# Patient Record
Sex: Male | Born: 1945 | Race: Black or African American | Hispanic: No | Smoking: Never smoker
Health system: Southern US, Community
[De-identification: ages and names within clinical notes are randomized; demographics above are authoritative.]

## PROBLEM LIST (undated history)

## (undated) DIAGNOSIS — E785 Hyperlipidemia, unspecified: Secondary | ICD-10-CM

## (undated) DIAGNOSIS — H269 Unspecified cataract: Secondary | ICD-10-CM

## (undated) DIAGNOSIS — I252 Old myocardial infarction: Secondary | ICD-10-CM

## (undated) DIAGNOSIS — E119 Type 2 diabetes mellitus without complications: Secondary | ICD-10-CM

## (undated) DIAGNOSIS — R21 Rash and other nonspecific skin eruption: Secondary | ICD-10-CM

## (undated) DIAGNOSIS — M109 Gout, unspecified: Secondary | ICD-10-CM

## (undated) DIAGNOSIS — I2510 Atherosclerotic heart disease of native coronary artery without angina pectoris: Secondary | ICD-10-CM

## (undated) DIAGNOSIS — J13 Pneumonia due to Streptococcus pneumoniae: Secondary | ICD-10-CM

## (undated) DIAGNOSIS — I10 Essential (primary) hypertension: Secondary | ICD-10-CM

## (undated) HISTORY — DX: Pneumonia due to Streptococcus pneumoniae: J13

## (undated) HISTORY — DX: Old myocardial infarction: I25.2

## (undated) HISTORY — DX: Unspecified cataract: H26.9

## (undated) HISTORY — DX: Gout, unspecified: M10.9

## (undated) HISTORY — DX: Type 2 diabetes mellitus without complications: E11.9

## (undated) HISTORY — DX: Hyperlipidemia, unspecified: E78.5

## (undated) HISTORY — DX: Atherosclerotic heart disease of native coronary artery without angina pectoris: I25.10

## (undated) HISTORY — PX: CORONARY ANGIOPLASTY WITH STENT PLACEMENT: SHX49

## (undated) HISTORY — DX: Essential (primary) hypertension: I10

## (undated) HISTORY — DX: Rash and other nonspecific skin eruption: R21

---

## 2001-07-11 HISTORY — PX: CORONARY ARTERY BYPASS GRAFT: SHX141

## 2001-10-11 ENCOUNTER — Encounter: Payer: Self-pay | Admitting: *Deleted

## 2001-10-11 ENCOUNTER — Emergency Department (HOSPITAL_COMMUNITY): Admission: EM | Admit: 2001-10-11 | Discharge: 2001-10-11 | Payer: Self-pay | Admitting: *Deleted

## 2001-11-22 DIAGNOSIS — I252 Old myocardial infarction: Secondary | ICD-10-CM

## 2001-11-22 HISTORY — DX: Old myocardial infarction: I25.2

## 2003-04-26 ENCOUNTER — Inpatient Hospital Stay (HOSPITAL_COMMUNITY): Admission: EM | Admit: 2003-04-26 | Discharge: 2003-05-06 | Payer: Self-pay

## 2003-04-26 ENCOUNTER — Encounter: Payer: Self-pay | Admitting: Emergency Medicine

## 2003-04-29 ENCOUNTER — Encounter: Payer: Self-pay | Admitting: Thoracic Surgery (Cardiothoracic Vascular Surgery)

## 2003-04-30 ENCOUNTER — Encounter: Payer: Self-pay | Admitting: Thoracic Surgery (Cardiothoracic Vascular Surgery)

## 2003-05-01 ENCOUNTER — Encounter: Payer: Self-pay | Admitting: Thoracic Surgery (Cardiothoracic Vascular Surgery)

## 2003-06-02 ENCOUNTER — Encounter (HOSPITAL_COMMUNITY): Admission: RE | Admit: 2003-06-02 | Discharge: 2003-08-31 | Payer: Self-pay | Admitting: *Deleted

## 2003-09-12 ENCOUNTER — Ambulatory Visit (HOSPITAL_COMMUNITY): Admission: RE | Admit: 2003-09-12 | Discharge: 2003-09-13 | Payer: Self-pay | Admitting: *Deleted

## 2004-10-26 ENCOUNTER — Ambulatory Visit: Payer: Self-pay | Admitting: Internal Medicine

## 2004-10-26 ENCOUNTER — Inpatient Hospital Stay (HOSPITAL_COMMUNITY): Admission: AD | Admit: 2004-10-26 | Discharge: 2004-10-28 | Payer: Self-pay | Admitting: Internal Medicine

## 2004-10-29 ENCOUNTER — Encounter: Admission: RE | Admit: 2004-10-29 | Discharge: 2005-01-27 | Payer: Self-pay | Admitting: Internal Medicine

## 2004-11-01 ENCOUNTER — Ambulatory Visit: Payer: Self-pay | Admitting: Internal Medicine

## 2004-11-29 ENCOUNTER — Ambulatory Visit: Payer: Self-pay | Admitting: Internal Medicine

## 2004-12-30 ENCOUNTER — Ambulatory Visit: Payer: Self-pay | Admitting: Internal Medicine

## 2005-03-31 ENCOUNTER — Ambulatory Visit: Payer: Self-pay | Admitting: Internal Medicine

## 2005-06-28 ENCOUNTER — Ambulatory Visit: Payer: Self-pay | Admitting: Internal Medicine

## 2005-10-25 ENCOUNTER — Ambulatory Visit: Payer: Self-pay | Admitting: Internal Medicine

## 2006-02-21 ENCOUNTER — Ambulatory Visit: Payer: Self-pay | Admitting: Internal Medicine

## 2006-05-30 ENCOUNTER — Ambulatory Visit: Payer: Self-pay | Admitting: Internal Medicine

## 2006-05-30 LAB — CONVERTED CEMR LAB
ALT: 18 units/L (ref 0–40)
AST: 21 units/L (ref 0–37)
Albumin: 4.1 g/dL (ref 3.5–5.2)
Alkaline Phosphatase: 105 units/L (ref 39–117)
BUN: 11 mg/dL (ref 6–23)
CO2: 31 meq/L (ref 19–32)
Calcium: 9.7 mg/dL (ref 8.4–10.5)
Chloride: 100 meq/L (ref 96–112)
Chol/HDL Ratio, serum: 3.3
Cholesterol: 127 mg/dL (ref 0–200)
Creatinine, Ser: 0.8 mg/dL (ref 0.4–1.5)
GFR calc non Af Amer: 105 mL/min
Glomerular Filtration Rate, Af Am: 127 mL/min/{1.73_m2}
Glucose, Bld: 101 mg/dL — ABNORMAL HIGH (ref 70–99)
HCT: 41.3 % (ref 39.0–52.0)
HDL: 38.6 mg/dL — ABNORMAL LOW (ref >39.0)
Hemoglobin: 13.9 g/dL (ref 13.0–17.0)
Hgb A1c MFr Bld: 6.6 % — ABNORMAL HIGH (ref 4.6–6.0)
LDL Cholesterol: 77 mg/dL (ref 0–99)
MCHC: 33.7 g/dL (ref 30.0–36.0)
MCV: 85.4 fL (ref 78.0–100.0)
PSA: 0.38 ng/mL (ref 0.10–4.00)
Platelets: 268 10*3/uL (ref 150–400)
Potassium: 3.5 meq/L (ref 3.5–5.1)
RBC: 4.83 M/uL (ref 4.22–5.81)
RDW: 13.1 % (ref 11.5–14.6)
Sodium: 139 meq/L (ref 135–145)
Total Bilirubin: 1.1 mg/dL (ref 0.3–1.2)
Total Protein: 8 g/dL (ref 6.0–8.3)
Triglyceride fasting, serum: 59 mg/dL (ref 0–149)
VLDL: 12 mg/dL (ref 0–40)
WBC: 9 10*3/uL (ref 4.5–10.5)

## 2006-06-05 ENCOUNTER — Ambulatory Visit: Payer: Self-pay | Admitting: Internal Medicine

## 2006-08-22 ENCOUNTER — Ambulatory Visit: Payer: Self-pay | Admitting: Internal Medicine

## 2006-08-22 LAB — CONVERTED CEMR LAB
Hgb A1c MFr Bld: 6.7 % — ABNORMAL HIGH (ref 4.6–6.0)
Uric Acid, Serum: 8.4 mg/dL — ABNORMAL HIGH (ref 2.4–7.0)

## 2006-11-21 ENCOUNTER — Ambulatory Visit: Payer: Self-pay | Admitting: Internal Medicine

## 2006-11-21 LAB — CONVERTED CEMR LAB
Hgb A1c MFr Bld: 6.6 % — ABNORMAL HIGH (ref 4.6–6.0)
Uric Acid, Serum: 5.6 mg/dL (ref 2.4–7.0)

## 2006-11-23 ENCOUNTER — Encounter: Payer: Self-pay | Admitting: Internal Medicine

## 2006-11-23 DIAGNOSIS — E785 Hyperlipidemia, unspecified: Secondary | ICD-10-CM

## 2006-11-23 DIAGNOSIS — E7800 Pure hypercholesterolemia, unspecified: Secondary | ICD-10-CM | POA: Insufficient documentation

## 2006-11-23 DIAGNOSIS — I2510 Atherosclerotic heart disease of native coronary artery without angina pectoris: Secondary | ICD-10-CM | POA: Insufficient documentation

## 2006-11-23 DIAGNOSIS — E1169 Type 2 diabetes mellitus with other specified complication: Secondary | ICD-10-CM | POA: Insufficient documentation

## 2006-11-23 DIAGNOSIS — E119 Type 2 diabetes mellitus without complications: Secondary | ICD-10-CM

## 2006-11-23 DIAGNOSIS — I10 Essential (primary) hypertension: Secondary | ICD-10-CM | POA: Insufficient documentation

## 2006-11-23 DIAGNOSIS — I252 Old myocardial infarction: Secondary | ICD-10-CM | POA: Insufficient documentation

## 2006-11-23 HISTORY — DX: Type 2 diabetes mellitus without complications: E11.9

## 2006-11-23 HISTORY — DX: Atherosclerotic heart disease of native coronary artery without angina pectoris: I25.10

## 2006-11-23 HISTORY — DX: Hyperlipidemia, unspecified: E78.5

## 2006-11-23 HISTORY — DX: Essential (primary) hypertension: I10

## 2007-02-21 ENCOUNTER — Ambulatory Visit: Payer: Self-pay | Admitting: Internal Medicine

## 2007-03-27 ENCOUNTER — Encounter: Payer: Self-pay | Admitting: Internal Medicine

## 2007-05-21 ENCOUNTER — Ambulatory Visit: Payer: Self-pay | Admitting: Internal Medicine

## 2007-05-21 LAB — CONVERTED CEMR LAB: Hgb A1c MFr Bld: 6.3 % — ABNORMAL HIGH (ref 4.6–6.0)

## 2007-08-29 ENCOUNTER — Ambulatory Visit: Payer: Self-pay | Admitting: Internal Medicine

## 2007-08-29 LAB — CONVERTED CEMR LAB
ALT: 18 units/L (ref 0–53)
AST: 21 units/L (ref 0–37)
Albumin: 4 g/dL (ref 3.5–5.2)
Alkaline Phosphatase: 81 units/L (ref 39–117)
BUN: 10 mg/dL (ref 6–23)
Basophils Absolute: 0 10*3/uL (ref 0.0–0.1)
Basophils Relative: 0.1 % (ref 0.0–1.0)
Bilirubin Urine: NEGATIVE
Bilirubin, Direct: 0.2 mg/dL (ref 0.0–0.3)
CO2: 30 meq/L (ref 19–32)
Calcium: 9.6 mg/dL (ref 8.4–10.5)
Chloride: 104 meq/L (ref 96–112)
Cholesterol: 118 mg/dL (ref 0–200)
Creatinine, Ser: 0.8 mg/dL (ref 0.4–1.5)
Creatinine,U: 233.9 mg/dL
Eosinophils Absolute: 0.3 10*3/uL (ref 0.0–0.6)
Eosinophils Relative: 3.2 % (ref 0.0–5.0)
GFR calc Af Amer: 126 mL/min
GFR calc non Af Amer: 104 mL/min
Glucose, Bld: 109 mg/dL — ABNORMAL HIGH (ref 70–99)
Glucose, Urine, Semiquant: NEGATIVE
HCT: 40.3 % (ref 39.0–52.0)
HDL: 35.2 mg/dL — ABNORMAL LOW (ref >39.0)
Hemoglobin: 13.6 g/dL (ref 13.0–17.0)
Hgb A1c MFr Bld: 6.3 % — ABNORMAL HIGH (ref 4.6–6.0)
Ketones, urine, test strip: NEGATIVE
LDL Cholesterol: 68 mg/dL (ref 0–99)
Lymphocytes Relative: 37.6 % (ref 12.0–46.0)
MCHC: 33.7 g/dL (ref 30.0–36.0)
MCV: 87.5 fL (ref 78.0–100.0)
Microalb Creat Ratio: 18.4 mg/g (ref 0.0–30.0)
Microalb, Ur: 4.3 mg/dL — ABNORMAL HIGH (ref 0.0–1.9)
Monocytes Absolute: 0.7 10*3/uL (ref 0.2–0.7)
Monocytes Relative: 7.6 % (ref 3.0–11.0)
Neutro Abs: 4.8 10*3/uL (ref 1.4–7.7)
Neutrophils Relative %: 51.5 % (ref 43.0–77.0)
Nitrite: NEGATIVE
PSA: 0.27 ng/mL (ref 0.10–4.00)
Platelets: 210 10*3/uL (ref 150–400)
Potassium: 4.2 meq/L (ref 3.5–5.1)
RBC: 4.61 M/uL (ref 4.22–5.81)
RDW: 13.6 % (ref 11.5–14.6)
Sodium: 139 meq/L (ref 135–145)
Specific Gravity, Urine: >=1.03
TSH: 3.66 microintl units/mL (ref 0.35–5.50)
Total Bilirubin: 1.2 mg/dL (ref 0.3–1.2)
Total CHOL/HDL Ratio: 3.4
Total Protein: 7.2 g/dL (ref 6.0–8.3)
Triglycerides: 74 mg/dL (ref 0–149)
Urobilinogen, UA: 0.2
VLDL: 15 mg/dL (ref 0–40)
WBC Urine, dipstick: NEGATIVE
WBC: 9.3 10*3/uL (ref 4.5–10.5)
pH: 5.5

## 2007-09-03 ENCOUNTER — Ambulatory Visit: Payer: Self-pay | Admitting: Internal Medicine

## 2007-09-04 ENCOUNTER — Encounter: Payer: Self-pay | Admitting: Internal Medicine

## 2007-12-05 ENCOUNTER — Ambulatory Visit: Payer: Self-pay | Admitting: Internal Medicine

## 2007-12-05 LAB — CONVERTED CEMR LAB: Hgb A1c MFr Bld: 6.4 % — ABNORMAL HIGH (ref 4.6–6.0)

## 2008-02-15 ENCOUNTER — Ambulatory Visit: Payer: Self-pay | Admitting: Internal Medicine

## 2008-02-15 LAB — CONVERTED CEMR LAB: Hgb A1c MFr Bld: 6.8 % — ABNORMAL HIGH (ref 4.6–6.0)

## 2008-05-20 ENCOUNTER — Ambulatory Visit: Payer: Self-pay | Admitting: Internal Medicine

## 2008-05-20 DIAGNOSIS — M109 Gout, unspecified: Secondary | ICD-10-CM | POA: Insufficient documentation

## 2008-05-20 HISTORY — DX: Gout, unspecified: M10.9

## 2008-05-20 LAB — CONVERTED CEMR LAB: Hgb A1c MFr Bld: 6.9 % — ABNORMAL HIGH (ref 4.6–6.0)

## 2008-09-18 ENCOUNTER — Encounter: Payer: Self-pay | Admitting: Internal Medicine

## 2008-09-19 ENCOUNTER — Ambulatory Visit: Payer: Self-pay | Admitting: Internal Medicine

## 2008-09-19 LAB — CONVERTED CEMR LAB
Cholesterol, target level: 200 mg/dL
HDL goal, serum: 40 mg/dL
LDL Goal: 70 mg/dL

## 2008-09-22 LAB — CONVERTED CEMR LAB
ALT: 43 units/L (ref 0–53)
AST: 32 units/L (ref 0–37)
Albumin: 4.1 g/dL (ref 3.5–5.2)
Alkaline Phosphatase: 120 units/L — ABNORMAL HIGH (ref 39–117)
BUN: 8 mg/dL (ref 6–23)
Basophils Absolute: 0 10*3/uL (ref 0.0–0.1)
Basophils Relative: 0.4 % (ref 0.0–3.0)
Bilirubin, Direct: 0.1 mg/dL (ref 0.0–0.3)
CO2: 27 meq/L (ref 19–32)
Calcium: 9.6 mg/dL (ref 8.4–10.5)
Chloride: 104 meq/L (ref 96–112)
Cholesterol: 106 mg/dL (ref 0–200)
Creatinine, Ser: 0.7 mg/dL (ref 0.4–1.5)
Eosinophils Absolute: 0.3 10*3/uL (ref 0.0–0.7)
Eosinophils Relative: 3.3 % (ref 0.0–5.0)
GFR calc Af Amer: 147 mL/min
GFR calc non Af Amer: 121 mL/min
Glucose, Bld: 230 mg/dL — ABNORMAL HIGH (ref 70–99)
HCT: 40.2 % (ref 39.0–52.0)
HDL: 29.7 mg/dL — ABNORMAL LOW (ref >39.0)
Hemoglobin: 14.2 g/dL (ref 13.0–17.0)
Hgb A1c MFr Bld: 7.6 % — ABNORMAL HIGH (ref 4.6–6.0)
LDL Cholesterol: 58 mg/dL (ref 0–99)
Lymphocytes Relative: 30.7 % (ref 12.0–46.0)
MCHC: 35.4 g/dL (ref 30.0–36.0)
MCV: 88.4 fL (ref 78.0–100.0)
Monocytes Absolute: 0.8 10*3/uL (ref 0.1–1.0)
Monocytes Relative: 8.4 % (ref 3.0–12.0)
Neutro Abs: 5.5 10*3/uL (ref 1.4–7.7)
Neutrophils Relative %: 57.2 % (ref 43.0–77.0)
PSA: 0.27 ng/mL (ref 0.10–4.00)
Platelets: 175 10*3/uL (ref 150–400)
Potassium: 4 meq/L (ref 3.5–5.1)
RBC: 4.55 M/uL (ref 4.22–5.81)
RDW: 13.3 % (ref 11.5–14.6)
Sodium: 138 meq/L (ref 135–145)
TSH: 1.98 microintl units/mL (ref 0.35–5.50)
Total Bilirubin: 1.1 mg/dL (ref 0.3–1.2)
Total CHOL/HDL Ratio: 3.6
Total Protein: 7.7 g/dL (ref 6.0–8.3)
Triglycerides: 90 mg/dL (ref 0–149)
VLDL: 18 mg/dL (ref 0–40)
WBC: 9.5 10*3/uL (ref 4.5–10.5)

## 2008-10-01 ENCOUNTER — Ambulatory Visit: Payer: Self-pay | Admitting: Gastroenterology

## 2008-11-10 ENCOUNTER — Ambulatory Visit: Payer: Self-pay | Admitting: Gastroenterology

## 2008-11-19 ENCOUNTER — Ambulatory Visit: Payer: Self-pay | Admitting: Gastroenterology

## 2008-12-22 ENCOUNTER — Ambulatory Visit: Payer: Self-pay | Admitting: Internal Medicine

## 2008-12-23 ENCOUNTER — Encounter: Payer: Self-pay | Admitting: Internal Medicine

## 2008-12-23 LAB — CONVERTED CEMR LAB: Hgb A1c MFr Bld: 8 % — ABNORMAL HIGH (ref 4.6–6.5)

## 2009-03-25 ENCOUNTER — Ambulatory Visit: Payer: Self-pay | Admitting: Internal Medicine

## 2009-03-25 LAB — CONVERTED CEMR LAB: Hgb A1c MFr Bld: 7.2 % — ABNORMAL HIGH (ref 4.6–6.5)

## 2009-06-22 ENCOUNTER — Ambulatory Visit: Payer: Self-pay | Admitting: Internal Medicine

## 2009-06-22 LAB — CONVERTED CEMR LAB: Hgb A1c MFr Bld: 7.1 % — ABNORMAL HIGH (ref 4.6–6.5)

## 2009-09-08 ENCOUNTER — Ambulatory Visit: Payer: Self-pay | Admitting: Internal Medicine

## 2009-09-08 DIAGNOSIS — J13 Pneumonia due to Streptococcus pneumoniae: Secondary | ICD-10-CM

## 2009-09-08 HISTORY — DX: Pneumonia due to Streptococcus pneumoniae: J13

## 2009-09-09 LAB — CONVERTED CEMR LAB: Hgb A1c MFr Bld: 7.5 % — ABNORMAL HIGH (ref 4.6–6.5)

## 2009-12-09 ENCOUNTER — Ambulatory Visit: Payer: Self-pay | Admitting: Internal Medicine

## 2009-12-09 LAB — CONVERTED CEMR LAB: Hgb A1c MFr Bld: 7.2 % — ABNORMAL HIGH (ref 4.6–6.5)

## 2010-03-16 ENCOUNTER — Ambulatory Visit: Payer: Self-pay | Admitting: Internal Medicine

## 2010-03-16 LAB — CONVERTED CEMR LAB
Blood Glucose, Fingerstick: 165
Hgb A1c MFr Bld: 7.9 % — ABNORMAL HIGH (ref 4.6–6.5)

## 2010-06-15 ENCOUNTER — Ambulatory Visit: Payer: Self-pay | Admitting: Internal Medicine

## 2010-06-17 LAB — CONVERTED CEMR LAB: Hgb A1c MFr Bld: 7.9 % — ABNORMAL HIGH (ref 4.6–6.5)

## 2010-08-10 NOTE — Assessment & Plan Note (Signed)
Summary: 3 month rov/njr   Vital Signs:  Patient profile:   65 year old male Weight:      223 pounds BMI:     30.35 Temp:     97.8 degrees F oral BP sitting:   134 / 70  (left arm) Cuff size:   regular  Vitals Entered By: Raechel Ache, RN (December 22, 2008 10:03 AM)  CC:  3 mo ROV. FBS 154.Marland Kitchen  History of Present Illness: 65 year old male, history of hypertension and dyslipidemia.  He has type 2 diabetes.  His last hemoglobin A1c7.6.  He denies any cardiopulmonary complaints.  Laboratory studies were done last visit, which revealed a low HDL cholesterol.  He is unaware that he has tried niacin in the past.  His LDL was at goal.  He continues to tolerate statin therapy without difficulty.  He has been compliant with his medications  Problems Prior to Update: 1)  Preventive Health Care  (ICD-V70.0) 2)  Gout  (ICD-274.9) 3)  Family History of Cad Male 1st Degree Relative <60  (ICD-V16.49) 4)  R Hand Surgery/traumatic Injury  () 5)  Myocardial Infarction, Hx of  (ICD-412) 6)  Hypertension  (ICD-401.9) 7)  Hyperlipidemia  (ICD-272.4) 8)  Diabetes Mellitus, Type II  (ICD-250.00) 9)  Coronary Artery Disease  (ICD-414.00)  Medications Prior to Update: 1)  Altace 10 Mg Caps (Ramipril) .Marland Kitchen.. 1 Once Daily 2)  Amlodipine Besylate 5 Mg Tabs (Amlodipine Besylate) .... Take 1 Tablet By Mouth Once A Day 3)  Bayer Aspirin 325 Mg Tabs (Aspirin) .... Take 1 Tablet By Mouth Once A Day 4)  Colchicine 0.6 Mg Tabs (Colchicine) .... Take 1 Tablet By Mouth Twice A Day 5)  Glimepiride 4 Mg Tabs (Glimepiride) .... Take 1 Tablet By Mouth Twice A Day 6)  Lipitor 40 Mg Tabs (Atorvastatin Calcium) .Marland Kitchen.. 1 Once Daily 7)  Metformin Hcl 500 Mg Tabs (Metformin Hcl) .... 2 Two Times A Day 8)  Metoprolol Tartrate 100 Mg Tabs (Metoprolol Tartrate) .... Take 1 Tablet By Mouth Twice A Day 9)  Freestyle Test Strips .... Use Two Times A Day 10)  Allopurinol 300 Mg  Tabs (Allopurinol) .Marland Kitchen.. 1 Once Daily 11)  Freestyle  Unistick Ii Lancets   Misc (Lancets) 12)  Moviprep 100 Gm  Solr (Peg-Kcl-Nacl-Nasulf-Na Asc-C) .... As Directed  Allergies: No Known Drug Allergies  Past History:  Past Medical History: Coronary artery disease Diabetes mellitus, type II Hyperlipidemia Hypertension Myocardial infarction, hx of Gout low HDL cholesterol  Past Surgical History: Reviewed history from 09/03/2007 and no changes required. Coronary artery bypass graft (2004) PTCA/stent (2005) hand surgery, right hand following a traumatic injury remote prior sigmoidoscopies, but no recent colonoscopy  Family History: Reviewed history from 02/21/2007 and no changes required. both parents died of coronary artery disease; mother had diabeteshe is one of 12 siblings, strongly positive for diabetes Family History of CAD Male 1st degree relative <60  Review of Systems  The patient denies anorexia, fever, weight loss, weight gain, vision loss, decreased hearing, hoarseness, chest pain, syncope, dyspnea on exertion, peripheral edema, prolonged cough, headaches, hemoptysis, abdominal pain, melena, hematochezia, severe indigestion/heartburn, hematuria, incontinence, genital sores, muscle weakness, suspicious skin lesions, transient blindness, difficulty walking, depression, unusual weight change, abnormal bleeding, enlarged lymph nodes, angioedema, breast masses, and testicular masses.    Physical Exam  General:  Well-developed,well-nourished,in no acute distress; alert,appropriate and cooperative throughout examination Head:  Normocephalic and atraumatic without obvious abnormalities. No apparent alopecia or balding. Eyes:  No corneal  or conjunctival inflammation noted. EOMI. Perrla. Funduscopic exam benign, without hemorrhages, exudates or papilledema. Vision grossly normal. Mouth:  Oral mucosa and oropharynx without lesions or exudates.  Teeth in good repair. Lungs:  Normal respiratory effort, chest expands symmetrically.  Lungs are clear to auscultation, no crackles or wheezes. Heart:  Normal rate and regular rhythm. S1 and S2 normal without gallop, murmur, click, rub or other extra sounds. Abdomen:  Bowel sounds positive,abdomen soft and non-tender without masses, organomegaly or hernias noted. Msk:  No deformity or scoliosis noted of thoracic or lumbar spine.   Pulses:  R and L carotid,radial,femoral,dorsalis pedis and posterior tibial pulses are full and equal bilaterally Extremities:  No clubbing, cyanosis, edema, or deformity noted with normal full range of motion of all joints.     Impression & Recommendations:  Problem # 1:  HYPERLIPIDEMIA (ICD-272.4)  His updated medication list for this problem includes:    Lipitor 40 Mg Tabs (Atorvastatin calcium) .Marland Kitchen... 1 once daily will add niacin to his regimen.  Samples of Niaspan, dispensed    His updated medication list for this problem includes:    Lipitor 40 Mg Tabs (Atorvastatin calcium) .Marland Kitchen... 1 once daily  Orders: Venipuncture (16109) TLB-A1C / Hgb A1C (Glycohemoglobin) (83036-A1C)  Problem # 2:  HYPERTENSION (ICD-401.9)  His updated medication list for this problem includes:    Altace 10 Mg Caps (Ramipril) .Marland Kitchen... 1 once daily    Amlodipine Besylate 5 Mg Tabs (Amlodipine besylate) .Marland Kitchen... Take 1 tablet by mouth once a day    Metoprolol Tartrate 100 Mg Tabs (Metoprolol tartrate) .Marland Kitchen... Take 1 tablet by mouth twice a day    His updated medication list for this problem includes:    Altace 10 Mg Caps (Ramipril) .Marland Kitchen... 1 once daily    Amlodipine Besylate 5 Mg Tabs (Amlodipine besylate) .Marland Kitchen... Take 1 tablet by mouth once a day    Metoprolol Tartrate 100 Mg Tabs (Metoprolol tartrate) .Marland Kitchen... Take 1 tablet by mouth twice a day  Orders: Venipuncture (60454) TLB-A1C / Hgb A1C (Glycohemoglobin) (83036-A1C)  Problem # 3:  DIABETES MELLITUS, TYPE II (ICD-250.00)  His updated medication list for this problem includes:    Altace 10 Mg Caps (Ramipril) .Marland Kitchen... 1  once daily    Bayer Aspirin 325 Mg Tabs (Aspirin) .Marland Kitchen... Take 1 tablet by mouth once a day    Glimepiride 4 Mg Tabs (Glimepiride) .Marland Kitchen... Take 1 tablet by mouth twice a day will repeat a hemoglobin A1c today if greater than 7.  Will add Januvia    His updated medication list for this problem includes:    Altace 10 Mg Caps (Ramipril) .Marland Kitchen... 1 once daily    Bayer Aspirin 325 Mg Tabs (Aspirin) .Marland Kitchen... Take 1 tablet by mouth once a day    Glimepiride 4 Mg Tabs (Glimepiride) .Marland Kitchen... Take 1 tablet by mouth twice a day  Orders: Venipuncture (09811) TLB-A1C / Hgb A1C (Glycohemoglobin) (83036-A1C)  Complete Medication List: 1)  Altace 10 Mg Caps (Ramipril) .Marland Kitchen.. 1 once daily 2)  Amlodipine Besylate 5 Mg Tabs (Amlodipine besylate) .... Take 1 tablet by mouth once a day 3)  Bayer Aspirin 325 Mg Tabs (Aspirin) .... Take 1 tablet by mouth once a day 4)  Colchicine 0.6 Mg Tabs (Colchicine) .... Take 1 tablet by mouth twice a day 5)  Glimepiride 4 Mg Tabs (Glimepiride) .... Take 1 tablet by mouth twice a day 6)  Lipitor 40 Mg Tabs (Atorvastatin calcium) .Marland Kitchen.. 1 once daily 7)  Metformin Hcl 1000 Mg Tabs (  metformin Hcl)  .... One tablet two  times a day 8)  Metoprolol Tartrate 100 Mg Tabs (Metoprolol tartrate) .... Take 1 tablet by mouth twice a day 9)  Freestyle Test Strips  .... Use two times a day 10)  Allopurinol 300 Mg Tabs (Allopurinol) .Marland Kitchen.. 1 once daily 11)  Freestyle Unistick Ii Lancets Misc (Lancets) 12)  Moviprep 100 Gm Solr (Peg-kcl-nacl-nasulf-na asc-c) .... As directed  Patient Instructions: 1)  Please schedule a follow-up appointment in 3 months. 2)  Limit your Sodium (Salt) to less than 2 grams a day(slightly less than 1/2 a teaspoon) to prevent fluid retention, swelling, or worsening of symptoms. 3)  It is important that you exercise regularly at least 20 minutes 5 times a week. If you develop chest pain, have severe difficulty breathing, or feel very tired , stop exercising immediately and  seek medical attention. 4)  Check your blood sugars regularly. If your readings are usually above : or below 70 you should contact our office. 5)  It is important that your Diabetic A1c level is checked every 3 months. 6)  See your eye doctor yearly to check for diabetic eye damage. 7)  attempt to slowly titrate your nice and does to 1500 mg at bedtime Prescriptions: METFORMIN HCL  1000 MG TABS (METFORMIN HCL) one tablet two  times a day  #180 x 6   Entered and Authorized by:   Gordy Savers  MD   Signed by:   Gordy Savers  MD on 12/22/2008   Method used:   Print then Give to Patient   RxID:   [id number]

## 2010-08-10 NOTE — Assessment & Plan Note (Signed)
Summary: 3 MONTH ROA/JLS   Vital Signs:  Patient Profile:   65 Years Old Male Height:     72 inches (182.88 cm) Weight:      204 pounds Temp:     98.3 degrees F oral BP sitting:   160 / 80  (left arm)  Vitals Entered By: Raechel Ache, RN (February 21, 2007 8:23 AM)               Chief Complaint:  ROV. FBS @home  118.Marland Kitchen  History of Present Illness: 65 year old male follow-up diabetes, hypertension, gout  Current Allergies: No known allergies    Family History:    both parents died of coronary artery disease; mother had diabeteshe is one of 12 siblings, strongly positive for diabetes    Family History of CAD Male 1st degree relative <60    Review of Systems  The patient denies anorexia, fever, weight loss, vision loss, decreased hearing, hoarseness, chest pain, syncope, dyspnea on exhertion, peripheral edema, prolonged cough, hemoptysis, abdominal pain, melena, hematochezia, severe indigestion/heartburn, hematuria, incontinence, genital sores, muscle weakness, suspicious skin lesions, transient blindness, difficulty walking, depression, unusual weight change, abnormal bleeding, enlarged lymph nodes, angioedema, breast masses, and testicular masses.         no significant gout.  Blood sugars at home have been under good control   Physical Exam  General:     Well-developed,well-nourished,in no acute distress; alert,appropriate and cooperative throughout examination Head:     Normocephalic and atraumatic without obvious abnormalities. No apparent alopecia or balding. Mouth:     Oral mucosa and oropharynx without lesions or exudates.  Teeth in good repair. Neck:     No deformities, masses, or tenderness noted. Lungs:     Normal respiratory effort, chest expands symmetrically. Lungs are clear to auscultation, no crackles or wheezes. Heart:     grade 2/6 systolic murmur Abdomen:     Bowel sounds positive,abdomen soft and non-tender without masses, organomegaly or  hernias noted.    Impression & Recommendations:  Problem # 1:  DIABETES MELLITUS, TYPE II (ICD-250.00)  His updated medication list for this problem includes:    Altace 10 Mg Caps (Ramipril) .Marland Kitchen... 1 once daily    Bayer Aspirin 325 Mg Tabs (Aspirin) .Marland Kitchen... Take 1 tablet by mouth once a day    Glimepiride 4 Mg Tabs (Glimepiride) .Marland Kitchen... Take 1 tablet by mouth twice a day    Metformin Hcl 500 Mg Tabs (Metformin hcl) .Marland Kitchen... 1 two times a day   Problem # 2:  HYPERTENSION (ICD-401.9)  His updated medication list for this problem includes:    Altace 10 Mg Caps (Ramipril) .Marland Kitchen... 1 once daily    Amlodipine Besylate 5 Mg Tabs (Amlodipine besylate) .Marland Kitchen... Take 1 tablet by mouth once a day    Metoprolol Tartrate 100 Mg Tabs (Metoprolol tartrate) .Marland Kitchen... Take 1 tablet by mouth twice a day   Complete Medication List: 1)  Allopurinol 100 Mg Tabs (Allopurinol) .... Take 1 tablet by mouth once a day 2)  Altace 10 Mg Caps (Ramipril) .Marland Kitchen.. 1 once daily 3)  Amlodipine Besylate 5 Mg Tabs (Amlodipine besylate) .... Take 1 tablet by mouth once a day 4)  Bayer Aspirin 325 Mg Tabs (Aspirin) .... Take 1 tablet by mouth once a day 5)  Colchicine 0.6 Mg Tabs (Colchicine) .... Take 1 tablet by mouth twice a day 6)  Glimepiride 4 Mg Tabs (Glimepiride) .... Take 1 tablet by mouth twice a day 7)  Lipitor 40 Mg Tabs (Atorvastatin  calcium) .Marland Kitchen.. 1 once daily 8)  Metformin Hcl 500 Mg Tabs (Metformin hcl) .Marland Kitchen.. 1 two times a day 9)  Metoprolol Tartrate 100 Mg Tabs (Metoprolol tartrate) .... Take 1 tablet by mouth twice a day   Patient Instructions: 1)  Please schedule a follow-up appointment in 3 months. 2)  Limit your Sodium (Salt) to less than 4 grams a day (slightly less than 1 teaspoon) to prevent fluid retention, swelling, or worsening or symptoms. 3)  It is important that you exercise regularly at least 20 minutes 5 times a week. If you develop chest pain, have severe difficulty breathing, or feel very tired , stop  exercising immediately and seek medical attention. 4)  Take an Aspirin every day. 5)  It is important that your Diabetic A1c level is checked every 3 months.    Prescriptions: METOPROLOL TARTRATE 100 MG TABS (METOPROLOL TARTRATE) Take 1 tablet by mouth twice a day  #180 x 6   Entered and Authorized by:   Gordy Savers  MD   Signed by:   Gordy Savers  MD on 02/21/2007   Method used:   Print then Give to Patient   RxID:   [id number] METFORMIN HCL 500 MG TABS (METFORMIN HCL) 1 two times a day  #180 x 6   Entered and Authorized by:   Gordy Savers  MD   Signed by:   Gordy Savers  MD on 02/21/2007   Method used:   Print then Give to Patient   RxID:   [id number] LIPITOR 40 MG TABS (ATORVASTATIN CALCIUM) 1 once daily  #90 x 6   Entered and Authorized by:   Gordy Savers  MD   Signed by:   Gordy Savers  MD on 02/21/2007   Method used:   Print then Give to Patient   RxID:   [id number] GLIMEPIRIDE 4 MG TABS (GLIMEPIRIDE) Take 1 tablet by mouth twice a day  #90 x 6   Entered and Authorized by:   Gordy Savers  MD   Signed by:   Gordy Savers  MD on 02/21/2007   Method used:   Print then Give to Patient   RxID:   [id number] COLCHICINE 0.6 MG TABS (COLCHICINE) Take 1 tablet by mouth twice a day  #180 x 6   Entered and Authorized by:   Gordy Savers  MD   Signed by:   Gordy Savers  MD on 02/21/2007   Method used:   Print then Give to Patient   RxID:   [id number] AMLODIPINE BESYLATE 5 MG TABS (AMLODIPINE BESYLATE) Take 1 tablet by mouth once a day  #90 x 6   Entered and Authorized by:   Gordy Savers  MD   Signed by:   Gordy Savers  MD on 02/21/2007   Method used:   Print then Give to Patient   RxID:   [id number] ALTACE 10 MG CAPS (RAMIPRIL) 1 once daily  #90 x 6   Entered and Authorized by:   Gordy Savers  MD   Signed by:   Gordy Savers  MD on 02/21/2007   Method  used:   Print then Give to Patient   RxID:   [id number] ALLOPURINOL 100 MG TABS (ALLOPURINOL) Take 1 tablet by mouth once a day  #90 x 6   Entered and Authorized by:   Gordy Savers  MD   Signed by:   Gordy Savers  MD  on 02/21/2007   Method used:   Print then Give to Patient   RxID:   [id number]

## 2010-08-10 NOTE — Assessment & Plan Note (Signed)
Summary: 3 month rov/njr/R/S.Marland KitchenSLM   Vital Signs:  Patient profile:   65 year old male Height:      72 inches Weight:      223 pounds BMI:     30.35 Temp:     98.0 degrees F oral BP sitting:   130 / 66  (left arm) Cuff size:   regular  Vitals Entered By: Raechel Ache, RN (March 25, 2009 10:42 AM) CC: 3 mo ROV. BS 132 Is Patient Diabetic? Yes   CC:  3 mo ROV. BS 132.  History of Present Illness: 65 year old patient who is seen today for follow-up of his coronary artery disease.  He is status post CABG.  He has type 2 diabetes, angina, and  Januvia 100 mg daily was added to his regimen 3 months ago.  At that time.  Hemoglobin A1c was 8.  He has noted a nice improvement in glycemic control.  He has treated hypertension and dyslipidemia.  He is tolerating statin therapy without difficulty.  There is been no nausea or concerns about his new medication.  His cardiac status remained stable.  He has a history gout, which has not been active  Allergies: No Known Drug Allergies  Past History:  Past Medical History: Reviewed history from 12/22/2008 and no changes required. Coronary artery disease Diabetes mellitus, type II Hyperlipidemia Hypertension Myocardial infarction, hx of Gout low HDL cholesterol  Past Surgical History: Reviewed history from 09/03/2007 and no changes required. Coronary artery bypass graft (2004) PTCA/stent (2005) hand surgery, right hand following a traumatic injury remote prior sigmoidoscopies, but no recent colonoscopy  Family History: Reviewed history from 02/21/2007 and no changes required. both parents died of coronary artery disease; mother had diabeteshe is one of 12 siblings, strongly positive for diabetes Family History of CAD Male 1st degree relative <60  Social History: Reviewed history from 09/03/2007 and no changes required. works part-time with a [employer] and is quite active physically  Review of Systems  The patient  denies anorexia, fever, weight loss, weight gain, vision loss, decreased hearing, hoarseness, chest pain, syncope, dyspnea on exertion, peripheral edema, prolonged cough, headaches, hemoptysis, abdominal pain, melena, hematochezia, severe indigestion/heartburn, hematuria, incontinence, genital sores, muscle weakness, suspicious skin lesions, transient blindness, difficulty walking, depression, unusual weight change, abnormal bleeding, enlarged lymph nodes, angioedema, breast masses, and testicular masses.    Physical Exam  General:  Well-developed,well-nourished,in no acute distress; alert,appropriate and cooperative throughout examination; 130/70 Head:  Normocephalic and atraumatic without obvious abnormalities. No apparent alopecia or balding. Eyes:  No corneal or conjunctival inflammation noted. EOMI. Perrla. Funduscopic exam benign, without hemorrhages, exudates or papilledema. Vision grossly normal. Mouth:  Oral mucosa and oropharynx without lesions or exudates.  Teeth in good repair. Neck:  No deformities, masses, or tenderness noted. Chest Wall:  sternotomy scar Lungs:  Normal respiratory effort, chest expands symmetrically. Lungs are clear to auscultation, no crackles or wheezes. Heart:  grade 2/6 systolic murmur Abdomen:  Bowel sounds positive,abdomen soft and non-tender without masses, organomegaly or hernias noted. Msk:  No deformity or scoliosis noted of thoracic or lumbar spine.    mild to moderate callus formation plantar surface of both first metatarsal joints  Sensation is intact to monofilament testing and vibratory sensation Pulses:  posterior tibial pulses not easily palpable dorsalis pedis pulses are intact Neurologic:  sensation intact to light touch and sensation intact to pinprick.  sensation intact to light touch and sensation intact to pinprick.    Diabetes Management Exam:  Eye Exam:       Eye Exam done here today          Results: normal   Impression &  Recommendations:  Problem # 1:  HYPERTENSION (ICD-401.9)  His updated medication list for this problem includes:    Altace 10 Mg Caps (Ramipril) .Marland Kitchen... 1 once daily    Amlodipine Besylate 5 Mg Tabs (Amlodipine besylate) .Marland Kitchen... Take 1 tablet by mouth once a day    Metoprolol Tartrate 100 Mg Tabs (Metoprolol tartrate) .Marland Kitchen... Take 1 tablet by mouth twice a day  His updated medication list for this problem includes:    Altace 10 Mg Caps (Ramipril) .Marland Kitchen... 1 once daily    Amlodipine Besylate 5 Mg Tabs (Amlodipine besylate) .Marland Kitchen... Take 1 tablet by mouth once a day    Metoprolol Tartrate 100 Mg Tabs (Metoprolol tartrate) .Marland Kitchen... Take 1 tablet by mouth twice a day  Problem # 2:  HYPERLIPIDEMIA (ICD-272.4)  His updated medication list for this problem includes:    Lipitor 40 Mg Tabs (Atorvastatin calcium) .Marland Kitchen... 1 once daily  His updated medication list for this problem includes:    Lipitor 40 Mg Tabs (Atorvastatin calcium) .Marland Kitchen... 1 once daily  Problem # 3:  DIABETES MELLITUS, TYPE II (ICD-250.00)  His updated medication list for this problem includes:    Altace 10 Mg Caps (Ramipril) .Marland Kitchen... 1 once daily    Bayer Aspirin 325 Mg Tabs (Aspirin) .Marland Kitchen... Take 1 tablet by mouth once a day    Glimepiride 4 Mg Tabs (Glimepiride) .Marland Kitchen... Take 1 tablet by mouth twice a day    Januvia 100 Mg Tabs (Sitagliptin phosphate) .Marland Kitchen... 1 once daily    His updated medication list for this problem includes:    Altace 10 Mg Caps (Ramipril) .Marland Kitchen... 1 once daily    Bayer Aspirin 325 Mg Tabs (Aspirin) .Marland Kitchen... Take 1 tablet by mouth once a day    Glimepiride 4 Mg Tabs (Glimepiride) .Marland Kitchen... Take 1 tablet by mouth twice a day    Januvia 100 Mg Tabs (Sitagliptin phosphate) .Marland Kitchen... 1 once daily  Orders: Venipuncture (21308) TLB-A1C / Hgb A1C (Glycohemoglobin) (83036-A1C)  Complete Medication List: 1)  Altace 10 Mg Caps (Ramipril) .Marland Kitchen.. 1 once daily 2)  Amlodipine Besylate 5 Mg Tabs (Amlodipine besylate) .... Take 1 tablet by mouth once  a day 3)  Bayer Aspirin 325 Mg Tabs (Aspirin) .... Take 1 tablet by mouth once a day 4)  Colchicine 0.6 Mg Tabs (Colchicine) .... Take 1 tablet by mouth twice a day 5)  Glimepiride 4 Mg Tabs (Glimepiride) .... Take 1 tablet by mouth twice a day 6)  Lipitor 40 Mg Tabs (Atorvastatin calcium) .Marland Kitchen.. 1 once daily 7)  Metformin Hcl 1000 Mg Tabs (metformin Hcl)  .... One tablet two  times a day 8)  Metoprolol Tartrate 100 Mg Tabs (Metoprolol tartrate) .... Take 1 tablet by mouth twice a day 9)  Freestyle Test Strips  .... Use two times a day 10)  Allopurinol 300 Mg Tabs (Allopurinol) .Marland Kitchen.. 1 once daily 11)  Freestyle Unistick Ii Lancets Misc (Lancets) 12)  Moviprep 100 Gm Solr (Peg-kcl-nacl-nasulf-na asc-c) .... As directed 13)  Januvia 100 Mg Tabs (Sitagliptin phosphate) .Marland Kitchen.. 1 once daily  Patient Instructions: 1)  Please schedule a follow-up appointment in 3 months. 2)  Limit your Sodium (Salt). 3)  It is important that you exercise regularly at least 20 minutes 5 times a week. If you develop chest pain, have severe difficulty breathing, or feel very tired ,  stop exercising immediately and seek medical attention. 4)  You need to lose weight. Consider a lower calorie diet and regular exercise.  5)  Check your blood sugars regularly. If your readings are usually above : or below 70 you should contact our office. 6)  It is important that your Diabetic A1c level is checked every 3 months. 7)  See your eye doctor yearly to check for diabetic eye damage. Prescriptions: JANUVIA 100 MG TABS (SITAGLIPTIN PHOSPHATE) 1 once daily  #90 x 6   Entered and Authorized by:   Gordy Savers  MD   Signed by:   Gordy Savers  MD on 03/25/2009   Method used:   Electronically to        [facility] [phone]* (retail)       [address]       [place], [place]  [address]       Ph: [phone] or [phone]       Fax: [phone]   RxID:   [id number] FREESTYLE  UNISTICK II LANCETS   MISC (LANCETS)   #100 x 6   Entered and Authorized by:   Gordy Savers  MD   Signed by:   Gordy Savers  MD on 03/25/2009   Method used:   Print then Give to Patient   RxID:   [id number] ALLOPURINOL 300 MG  TABS (ALLOPURINOL) 1 once daily  #100 x 6   Entered and Authorized by:   Gordy Savers  MD   Signed by:   Gordy Savers  MD on 03/25/2009   Method used:   Electronically to        [facility] [phone]* (retail)       [address]       [place], [place]  [address]       Ph: [phone] or [phone]       Fax: [phone]   RxID:   [id number] FREESTYLE TEST STRIPS use two times a day  #100 x 6   Entered and Authorized by:   Gordy Savers  MD   Signed by:   Gordy Savers  MD on 03/25/2009   Method used:   Print then Give to Patient   RxID:   [id number] METOPROLOL TARTRATE 100 MG TABS (METOPROLOL TARTRATE) Take 1 tablet by mouth twice a day  #180 x 5   Entered and Authorized by:   Gordy Savers  MD   Signed by:   Gordy Savers  MD on 03/25/2009   Method used:   Electronically to        [facility] [phone]* (retail)       [address]       [place], [place]  [address]       Ph: [phone] or [phone]       Fax: [phone]   RxID:   [id number] METFORMIN HCL  1000 MG TABS (METFORMIN HCL) one tablet two  times a day  #180 x 6   Entered and Authorized by:   Gordy Savers  MD   Signed by:   Gordy Savers  MD on 03/25/2009   Method used:   Print then Give to Patient   RxID:   [id number] LIPITOR 40 MG TABS (  ATORVASTATIN CALCIUM) 1 once daily  #90 x 5   Entered and Authorized by:   Gordy Savers  MD   Signed by:   Gordy Savers  MD on 03/25/2009   Method used:   Electronically to        [facility] [phone]* (retail)       [address]       [place], [place]  [address]       Ph: [phone] or [phone]       Fax: [phone]   RxID:   [id number] GLIMEPIRIDE 4 MG TABS (GLIMEPIRIDE) Take 1 tablet by mouth twice a day  #90 x 6   Entered and Authorized by:   Gordy Savers  MD   Signed by:   Gordy Savers  MD on 03/25/2009   Method used:   Electronically to        [facility] [phone]* (retail)       [address]       [place], [place]  [address]       Ph: [phone] or [phone]       Fax: [phone]   RxID:   [id number] COLCHICINE 0.6 MG TABS (COLCHICINE) Take 1 tablet by mouth twice a day  #180 x 6   Entered and Authorized by:   Gordy Savers  MD   Signed by:   Gordy Savers  MD on 03/25/2009   Method used:   Electronically to        [facility] [phone]* (retail)       [address]       [place], [place]  [address]       Ph: [phone] or [phone]       Fax: [phone]   RxID:   [id number] AMLODIPINE BESYLATE 5 MG TABS (AMLODIPINE BESYLATE) Take 1 tablet by mouth once a day  #90 x 5   Entered and Authorized by:   Gordy Savers  MD   Signed by:   Gordy Savers  MD on 03/25/2009   Method used:   Electronically to        [facility] [phone]* (retail)       [address]       [place], [place]  [address]       Ph: [phone] or [phone]       Fax: [phone]   RxID:   [id number] ALTACE 10 MG CAPS (RAMIPRIL) 1 once daily  #90 x 5   Entered and Authorized by:   Gordy Savers  MD   Signed by:   Gordy Savers  MD on 03/25/2009   Method used:   Electronically to        [facility] [phone]* (retail)       [address]       [place], [place]  [address]       Ph: [phone] or [phone]       Fax: [phone]   RxID:   [id number] JANUVIA 100 MG TABS (SITAGLIPTIN PHOSPHATE) 1 once daily   #90 x 6   Entered and Authorized by:  Gordy Savers  MD   Signed by:   Gordy Savers  MD on 03/25/2009   Method used:   Print then Give to Patient   RxID:   [id number] FREESTYLE UNISTICK II LANCETS   MISC (LANCETS)   #100 x 6   Entered and Authorized by:   Gordy Savers  MD   Signed by:   Gordy Savers  MD on 03/25/2009   Method used:   Print then Give to Patient   RxID:   [id number] ALLOPURINOL 300 MG  TABS (ALLOPURINOL) 1 once daily  #100 x 6   Entered and Authorized by:   Gordy Savers  MD   Signed by:   Gordy Savers  MD on 03/25/2009   Method used:   Print then Give to Patient   RxID:   [id number] FREESTYLE TEST STRIPS use two times a day  #100 x 6   Entered and Authorized by:   Gordy Savers  MD   Signed by:   Gordy Savers  MD on 03/25/2009   Method used:   Print then Give to Patient   RxID:   [id number] METOPROLOL TARTRATE 100 MG TABS (METOPROLOL TARTRATE) Take 1 tablet by mouth twice a day  #180 x 5   Entered and Authorized by:   Gordy Savers  MD   Signed by:   Gordy Savers  MD on 03/25/2009   Method used:   Print then Give to Patient   RxID:   [id number] METFORMIN HCL  1000 MG TABS (METFORMIN HCL) one tablet two  times a day  #180 x 6   Entered and Authorized by:   Gordy Savers  MD   Signed by:   Gordy Savers  MD on 03/25/2009   Method used:   Print then Give to Patient   RxID:   [id number] LIPITOR 40 MG TABS (ATORVASTATIN CALCIUM) 1 once daily  #90 x 5   Entered and Authorized by:   Gordy Savers  MD   Signed by:   Gordy Savers  MD on 03/25/2009   Method used:   Print then Give to Patient   RxID:   [id number] GLIMEPIRIDE 4 MG TABS (GLIMEPIRIDE) Take 1 tablet by mouth twice a day  #90 x 6   Entered and Authorized by:   Gordy Savers  MD   Signed by:   Gordy Savers  MD on 03/25/2009   Method used:   Print then Give to Patient    RxID:   [id number] COLCHICINE 0.6 MG TABS (COLCHICINE) Take 1 tablet by mouth twice a day  #180 x 6   Entered and Authorized by:   Gordy Savers  MD   Signed by:   Gordy Savers  MD on 03/25/2009   Method used:   Print then Give to Patient   RxID:   [id number] AMLODIPINE BESYLATE 5 MG TABS (AMLODIPINE BESYLATE) Take 1 tablet by mouth once a day  #90 x 5   Entered and Authorized by:   Gordy Savers  MD   Signed by:   Gordy Savers  MD on 03/25/2009   Method used:   Print then Give to Patient   RxID:   [id number] ALTACE 10 MG CAPS (RAMIPRIL) 1 once daily  #90 x 5   Entered and Authorized by:   Gordy Savers  MD   Signed by:   Gordy Savers  MD on 03/25/2009   Method used:   Print then Give to Patient   RxID:   [id number]

## 2010-08-10 NOTE — Assessment & Plan Note (Signed)
Summary: 3 MONTH ROV/NJR/RESCHED/CB   Vital Signs:  Patient profile:   65 year old male Weight:      222 pounds Temp:     97.9 degrees F oral BP sitting:   140 / 80  (left arm) Cuff size:   regular  Vitals Entered By: Duard Brady LPN (December 09, 2128 11:47 AM) CC: 3 mos rov - doing well    fbs 143     has been running 100's to 120's Is Patient Diabetic? Yes Did you bring your meter with you today? Yes   CC:  3 mos rov - doing well    fbs 143     has been running 100's to 120's.  History of Present Illness: 65 year old patient who is seen today for follow-up.  He has a history of dyslipidemia type 2 diabetes, hypertension, and coronary artery disease.  He is maintained lies glycemic control with fasting blood sugars ranging from 100 to 120.  He denies any exertional chest pain.  he has a history of gout, which also has been stable.  A random blood sugar today 143.  He was seen earlier in the spring for left lower lobe pneumonia.  He denies any pulmonary complaints  Allergies (verified): No Known Drug Allergies  Past History:  Past Medical History: Reviewed history from 12/22/2008 and no changes required. Coronary artery disease Diabetes mellitus, type II Hyperlipidemia Hypertension Myocardial infarction, hx of Gout low HDL cholesterol  Past Surgical History: Reviewed history from 09/03/2007 and no changes required. Coronary artery bypass graft (2004) PTCA/stent (2005) hand surgery, right hand following a traumatic injury remote prior sigmoidoscopies, but no recent colonoscopy  Review of Systems  The patient denies anorexia, fever, weight loss, weight gain, vision loss, decreased hearing, hoarseness, chest pain, syncope, dyspnea on exertion, peripheral edema, prolonged cough, headaches, hemoptysis, abdominal pain, melena, hematochezia, severe indigestion/heartburn, hematuria, incontinence, genital sores, muscle weakness, suspicious skin lesions, transient blindness,  difficulty walking, depression, unusual weight change, abnormal bleeding, enlarged lymph nodes, angioedema, breast masses, and testicular masses.    Physical Exam  General:  Well-developed,well-nourished,in no acute distress; alert,appropriate and cooperative throughout examination; blood pressure 130/80 Head:  Normocephalic and atraumatic without obvious abnormalities. No apparent alopecia or balding. Eyes:  No corneal or conjunctival inflammation noted. EOMI. Perrla. Funduscopic exam benign, without hemorrhages, exudates or papilledema. Vision grossly normal. Mouth:  Oral mucosa and oropharynx without lesions or exudates.  Teeth in good repair. Neck:  No deformities, masses, or tenderness noted. Lungs:  Normal respiratory effort, chest expands symmetrically. Lungs are clear to auscultation, no crackles or wheezes. Heart:  Normal rate and regular rhythm. S1 and S2 normal without gallop, murmur, click, rub or other extra sounds. Abdomen:  Bowel sounds positive,abdomen soft and non-tender without masses, organomegaly or hernias noted. Msk:  No deformity or scoliosis noted of thoracic or lumbar spine.   Extremities:  No clubbing, cyanosis, edema, or deformity noted with normal full range of motion of all joints.    Diabetes Management Exam:    Foot Exam (with socks and/or shoes not present):       Sensory-Pinprick/Light touch:          Left medial foot (L-4): normal          Left dorsal foot (L-5): normal          Left lateral foot (S-1): normal          Right medial foot (L-4): normal  Right dorsal foot (L-5): normal          Right lateral foot (S-1): normal       Sensory-Monofilament:          Left foot: normal          Right foot: normal       Inspection:          Left foot: normal          Right foot: normal       Nails:          Left foot: normal          Right foot: normal    Foot Exam by Podiatrist:       Date: 12/09/2009       Results: no diabetic findings       Done  by: PCP   Impression & Recommendations:  Problem # 1:  HYPERTENSION (ICD-401.9)  His updated medication list for this problem includes:    Altace 10 Mg Caps (Ramipril) .Marland Kitchen... 1 once daily    Amlodipine Besylate 5 Mg Tabs (Amlodipine besylate) .Marland Kitchen... Take 1 tablet by mouth once a day    Metoprolol Tartrate 100 Mg Tabs (Metoprolol tartrate) .Marland Kitchen... Take 1 tablet by mouth twice a day  His updated medication list for this problem includes:    Altace 10 Mg Caps (Ramipril) .Marland Kitchen... 1 once daily    Amlodipine Besylate 5 Mg Tabs (Amlodipine besylate) .Marland Kitchen... Take 1 tablet by mouth once a day    Metoprolol Tartrate 100 Mg Tabs (Metoprolol tartrate) .Marland Kitchen... Take 1 tablet by mouth twice a day  Problem # 2:  HYPERLIPIDEMIA (ICD-272.4)  His updated medication list for this problem includes:    Lipitor 40 Mg Tabs (Atorvastatin calcium) .Marland Kitchen... 1 once daily  His updated medication list for this problem includes:    Lipitor 40 Mg Tabs (Atorvastatin calcium) .Marland Kitchen... 1 once daily  Problem # 3:  DIABETES MELLITUS, TYPE II (ICD-250.00)  His updated medication list for this problem includes:    Altace 10 Mg Caps (Ramipril) .Marland Kitchen... 1 once daily    Bayer Aspirin 325 Mg Tabs (Aspirin) .Marland Kitchen... Take 1 tablet by mouth once a day    Glimepiride 4 Mg Tabs (Glimepiride) .Marland Kitchen... Take 1 tablet by mouth twice a day    Januvia 100 Mg Tabs (Sitagliptin phosphate) .Marland Kitchen... 1 once daily    His updated medication list for this problem includes:    Altace 10 Mg Caps (Ramipril) .Marland Kitchen... 1 once daily    Bayer Aspirin 325 Mg Tabs (Aspirin) .Marland Kitchen... Take 1 tablet by mouth once a day    Glimepiride 4 Mg Tabs (Glimepiride) .Marland Kitchen... Take 1 tablet by mouth twice a day    Januvia 100 Mg Tabs (Sitagliptin phosphate) .Marland Kitchen... 1 once daily  Orders: Venipuncture ([address]) TLB-A1C / Hgb A1C (Glycohemoglobin) (83036-A1C)  Problem # 4:  CORONARY ARTERY DISEASE (ICD-414.00)  His updated medication list for this problem includes:    Altace 10 Mg Caps (Ramipril)  .Marland Kitchen... 1 once daily    Amlodipine Besylate 5 Mg Tabs (Amlodipine besylate) .Marland Kitchen... Take 1 tablet by mouth once a day    Bayer Aspirin 325 Mg Tabs (Aspirin) .Marland Kitchen... Take 1 tablet by mouth once a day    Metoprolol Tartrate 100 Mg Tabs (Metoprolol tartrate) .Marland Kitchen... Take 1 tablet by mouth twice a day  His updated medication list for this problem includes:    Altace 10 Mg Caps (Ramipril) .Marland Kitchen... 1 once daily    Amlodipine  Besylate 5 Mg Tabs (Amlodipine besylate) .Marland Kitchen... Take 1 tablet by mouth once a day    Bayer Aspirin 325 Mg Tabs (Aspirin) .Marland Kitchen... Take 1 tablet by mouth once a day    Metoprolol Tartrate 100 Mg Tabs (Metoprolol tartrate) .Marland Kitchen... Take 1 tablet by mouth twice a day  Complete Medication List: 1)  Altace 10 Mg Caps (Ramipril) .Marland Kitchen.. 1 once daily 2)  Amlodipine Besylate 5 Mg Tabs (Amlodipine besylate) .... Take 1 tablet by mouth once a day 3)  Bayer Aspirin 325 Mg Tabs (Aspirin) .... Take 1 tablet by mouth once a day 4)  Colchicine 0.6 Mg Tabs (Colchicine) .... Take 1 tablet by mouth daily 5)  Glimepiride 4 Mg Tabs (Glimepiride) .... Take 1 tablet by mouth twice a day 6)  Lipitor 40 Mg Tabs (Atorvastatin calcium) .Marland Kitchen.. 1 once daily 7)  Metformin Hcl 1000 Mg Tabs (metformin Hcl)  .... One tablet two  times a day 8)  Metoprolol Tartrate 100 Mg Tabs (Metoprolol tartrate) .... Take 1 tablet by mouth twice a day 9)  Freestyle Test Strips  .... Use two times a day 10)  Allopurinol 300 Mg Tabs (Allopurinol) .Marland Kitchen.. 1 once daily 11)  Freestyle Unistick Ii Lancets Misc (Lancets) 12)  Januvia 100 Mg Tabs (Sitagliptin phosphate) .Marland Kitchen.. 1 once daily 13)  Colcrys 0.6 Mg Tabs (Colchicine) .... Qd 14)  Azithromycin 250 Mg Tabs (Azithromycin) .... Two initially, then one daily for 4 additional days  Patient Instructions: 1)  Please schedule a follow-up appointment in 3 months. 2)  Limit your Sodium (Salt). 3)  It is important that you exercise regularly at least 20 minutes 5 times a week. If you develop chest pain,  have severe difficulty breathing, or feel very tired , stop exercising immediately and seek medical attention. 4)  You need to lose weight. Consider a lower calorie diet and regular exercise.  5)  Check your blood sugars regularly. If your readings are usually above : or below 70 you should contact our office. 6)  It is important that your Diabetic A1c level is checked every 3 months. 7)  See your eye doctor yearly to check for diabetic eye damage.

## 2010-08-10 NOTE — Assessment & Plan Note (Signed)
Summary: 3 month rov/njr/pt rescd -not feeling well//ccm   Vital Signs:  Patient profile:   66 year old male Weight:      222 pounds O2 Sat:      97 % on Room air Temp:     100.0 degrees F oral BP sitting:   180 / 100  (right arm) Cuff size:   regular  Vitals Entered By: Duard Brady LPN (September 08, 1608 1:48 PM)  O2 Flow:  Room air CC: 3 mos rov - cold sx - cough non-productive , (L) flank pain with cough, sob , otc not helping    FBS 129 Is Patient Diabetic? Yes   CC:  3 mos rov - cold sx - cough non-productive , (L) flank pain with cough, sob , and otc not helping    FBS 129.  History of Present Illness: 35 -year-old patient who is seen today for follow-up  of his hypertension, type 2 diabetes, and dyslipidemia.  In the past few weeks.  He has felt unwell with a dry, large and nonproductive cough is at persistent left posterior chest wall pain aggravated by coughing.  He has had fever, but denies any chills.  No sputum production.  He has type 2 diabetes and his last hemoglobin A1c7.1.  He has a history of gout, which has been stable and remains on Lipitor 40 mg daily for his dyslipidemia, which he continues to tolerate.  Preventive Screening-Counseling & Management  Alcohol-Tobacco     Smoking Status: never  Allergies (verified): No Known Drug Allergies  Past History:  Past Medical History: Reviewed history from 12/22/2008 and no changes required. Coronary artery disease Diabetes mellitus, type II Hyperlipidemia Hypertension Myocardial infarction, hx of Gout low HDL cholesterol  Past Surgical History: Reviewed history from 09/03/2007 and no changes required. Coronary artery bypass graft (2004) PTCA/stent (2005) hand surgery, right hand following a traumatic injury remote prior sigmoidoscopies, but no recent colonoscopy  Review of Systems       The patient complains of anorexia, fever, chest pain, and prolonged cough.  The patient denies weight loss,  weight gain, vision loss, decreased hearing, hoarseness, syncope, dyspnea on exertion, peripheral edema, headaches, hemoptysis, abdominal pain, melena, hematochezia, severe indigestion/heartburn, hematuria, incontinence, genital sores, muscle weakness, suspicious skin lesions, transient blindness, difficulty walking, depression, unusual weight change, abnormal bleeding, enlarged lymph nodes, angioedema, breast masses, and testicular masses.    Physical Exam  General:  Well-developed,well-nourished,in no acute distress; alert,appropriate and cooperative throughout examination; 140/80 Head:  Normocephalic and atraumatic without obvious abnormalities. No apparent alopecia or balding. Eyes:  No corneal or conjunctival inflammation noted. EOMI. Perrla. Funduscopic exam benign, without hemorrhages, exudates or papilledema. Vision grossly normal. Ears:  External ear exam shows no significant lesions or deformities.  Otoscopic examination reveals clear canals, tympanic membranes are intact bilaterally without bulging, retraction, inflammation or discharge. Hearing is grossly normal bilaterally. Mouth:  Oral mucosa and oropharynx without lesions or exudates.  Teeth in good repair. Neck:  No deformities, masses, or tenderness noted. Chest Wall:  No deformities, masses, tenderness or gynecomastia noted. Breasts:  No masses or gynecomastia noted Lungs:  rales left lower lung field Heart:  Normal rate and regular rhythm. S1 and S2 normal without gallop, murmur, click, rub or other extra sounds. Abdomen:  Bowel sounds positive,abdomen soft and non-tender without masses, organomegaly or hernias noted. Msk:  No deformity or scoliosis noted of thoracic or lumbar spine.   Pulses:  R and L carotid,radial,femoral,dorsalis pedis and posterior tibial  pulses are full and equal bilaterally Extremities:  No clubbing, cyanosis, edema, or deformity noted with normal full range of motion of all joints.   Skin:  Intact without  suspicious lesions or rashes Cervical Nodes:  No lymphadenopathy noted   Impression & Recommendations:  Problem # 1:  PNEUMONIA, LEFT LOWER LOBE (ICD-481)  His updated medication list for this problem includes:    Azithromycin 250 Mg Tabs (Azithromycin) .Marland Kitchen..Marland Kitchen Two initially, then one daily for 4 additional days  His updated medication list for this problem includes:    Azithromycin 250 Mg Tabs (Azithromycin) .Marland Kitchen..Marland Kitchen Two initially, then one daily for 4 additional days  Problem # 2:  HYPERTENSION (ICD-401.9)  His updated medication list for this problem includes:    Altace 10 Mg Caps (Ramipril) .Marland Kitchen... 1 once daily    Amlodipine Besylate 5 Mg Tabs (Amlodipine besylate) .Marland Kitchen... Take 1 tablet by mouth once a day    Metoprolol Tartrate 100 Mg Tabs (Metoprolol tartrate) .Marland Kitchen... Take 1 tablet by mouth twice a day  His updated medication list for this problem includes:    Altace 10 Mg Caps (Ramipril) .Marland Kitchen... 1 once daily    Amlodipine Besylate 5 Mg Tabs (Amlodipine besylate) .Marland Kitchen... Take 1 tablet by mouth once a day    Metoprolol Tartrate 100 Mg Tabs (Metoprolol tartrate) .Marland Kitchen... Take 1 tablet by mouth twice a day  Problem # 3:  DIABETES MELLITUS, TYPE II (ICD-250.00)  His updated medication list for this problem includes:    Altace 10 Mg Caps (Ramipril) .Marland Kitchen... 1 once daily    Bayer Aspirin 325 Mg Tabs (Aspirin) .Marland Kitchen... Take 1 tablet by mouth once a day    Glimepiride 4 Mg Tabs (Glimepiride) .Marland Kitchen... Take 1 tablet by mouth twice a day    Januvia 100 Mg Tabs (Sitagliptin phosphate) .Marland Kitchen... 1 once daily    His updated medication list for this problem includes:    Altace 10 Mg Caps (Ramipril) .Marland Kitchen... 1 once daily    Bayer Aspirin 325 Mg Tabs (Aspirin) .Marland Kitchen... Take 1 tablet by mouth once a day    Glimepiride 4 Mg Tabs (Glimepiride) .Marland Kitchen... Take 1 tablet by mouth twice a day    Januvia 100 Mg Tabs (Sitagliptin phosphate) .Marland Kitchen... 1 once daily  Orders: Venipuncture (04540) TLB-A1C / Hgb A1C (Glycohemoglobin)  (83036-A1C)  Problem # 4:  HYPERLIPIDEMIA (ICD-272.4)  His updated medication list for this problem includes:    Lipitor 40 Mg Tabs (Atorvastatin calcium) .Marland Kitchen... 1 once daily  His updated medication list for this problem includes:    Lipitor 40 Mg Tabs (Atorvastatin calcium) .Marland Kitchen... 1 once daily  Complete Medication List: 1)  Altace 10 Mg Caps (Ramipril) .Marland Kitchen.. 1 once daily 2)  Amlodipine Besylate 5 Mg Tabs (Amlodipine besylate) .... Take 1 tablet by mouth once a day 3)  Bayer Aspirin 325 Mg Tabs (Aspirin) .... Take 1 tablet by mouth once a day 4)  Colchicine 0.6 Mg Tabs (Colchicine) .... Take 1 tablet by mouth daily 5)  Glimepiride 4 Mg Tabs (Glimepiride) .... Take 1 tablet by mouth twice a day 6)  Lipitor 40 Mg Tabs (Atorvastatin calcium) .Marland Kitchen.. 1 once daily 7)  Metformin Hcl 1000 Mg Tabs (metformin Hcl)  .... One tablet two  times a day 8)  Metoprolol Tartrate 100 Mg Tabs (Metoprolol tartrate) .... Take 1 tablet by mouth twice a day 9)  Freestyle Test Strips  .... Use two times a day 10)  Allopurinol 300 Mg Tabs (Allopurinol) .Marland Kitchen.. 1 once daily 11)  Freestyle Unistick Ii Lancets Misc (Lancets) 12)  Januvia 100 Mg Tabs (Sitagliptin phosphate) .Marland Kitchen.. 1 once daily 13)  Colcrys 0.6 Mg Tabs (Colchicine) .... Qd 14)  Azithromycin 250 Mg Tabs (Azithromycin) .... Two initially, then one daily for 4 additional days  Patient Instructions: 1)  Get plenty of rest, drink lots of clear liquids, and use Tylenol or Ibuprofen for fever and comfort. Return in 7-10 days if you're not better:sooner if you're feeling worse. 2)  Limit your Sodium (Salt) to less than 2 grams a day(slightly less than 1/2 a teaspoon) to prevent fluid retention, swelling, or worsening of symptoms. 3)  Check your blood sugars regularly. If your readings are usually above : or below 70 you should contact our office. 4)  It is important that your Diabetic A1c level is checked every 3 months. 5)  See your eye doctor yearly to check for  diabetic eye damage. 6)  Please schedule a follow-up appointment in 3 months. Prescriptions: AZITHROMYCIN 250 MG TABS (AZITHROMYCIN) two initially, then one daily for 4 additional days  #6 x 0   Entered and Authorized by:   Gordy Savers  MD   Signed by:   Gordy Savers  MD on 09/08/2009   Method used:   Print then Give to Patient   RxID:   [id number] COLCRYS 0.6 MG TABS (COLCHICINE) qd  #90 x 6   Entered and Authorized by:   Gordy Savers  MD   Signed by:   Gordy Savers  MD on 09/08/2009   Method used:   Print then Give to Patient   RxID:   [id number] JANUVIA 100 MG TABS (SITAGLIPTIN PHOSPHATE) 1 once daily  #90 x 6   Entered and Authorized by:   Gordy Savers  MD   Signed by:   Gordy Savers  MD on 09/08/2009   Method used:   Print then Give to Patient   RxID:   [id number] FREESTYLE UNISTICK II LANCETS   MISC (LANCETS)   #100 x 6   Entered and Authorized by:   Gordy Savers  MD   Signed by:   Gordy Savers  MD on 09/08/2009   Method used:   Print then Give to Patient   RxID:   [id number] ALLOPURINOL 300 MG  TABS (ALLOPURINOL) 1 once daily  #100 x 6   Entered and Authorized by:   Gordy Savers  MD   Signed by:   Gordy Savers  MD on 09/08/2009   Method used:   Print then Give to Patient   RxID:   [id number] FREESTYLE TEST STRIPS use two times a day  #100 x 6   Entered and Authorized by:   Gordy Savers  MD   Signed by:   Gordy Savers  MD on 09/08/2009   Method used:   Print then Give to Patient   RxID:   [id number] METOPROLOL TARTRATE 100 MG TABS (METOPROLOL TARTRATE) Take 1 tablet by mouth twice a day  #180 x 5   Entered and Authorized by:   Gordy Savers  MD   Signed by:   Gordy Savers  MD on 09/08/2009   Method used:   Print then Give to Patient   RxID:   [id number] METFORMIN HCL  1000 MG TABS (METFORMIN HCL) one tablet two  times a day  #180 x 6    Entered and Authorized by:   Gordy Savers  MD  Signed by:   Gordy Savers  MD on 09/08/2009   Method used:   Print then Give to Patient   RxID:   [id number] LIPITOR 40 MG TABS (ATORVASTATIN CALCIUM) 1 once daily  #90 x 5   Entered and Authorized by:   Gordy Savers  MD   Signed by:   Gordy Savers  MD on 09/08/2009   Method used:   Print then Give to Patient   RxID:   [id number] GLIMEPIRIDE 4 MG TABS (GLIMEPIRIDE) Take 1 tablet by mouth twice a day  #90 x 6   Entered and Authorized by:   Gordy Savers  MD   Signed by:   Gordy Savers  MD on 09/08/2009   Method used:   Print then Give to Patient   RxID:   [id number] AMLODIPINE BESYLATE 5 MG TABS (AMLODIPINE BESYLATE) Take 1 tablet by mouth once a day  #90 x 5   Entered and Authorized by:   Gordy Savers  MD   Signed by:   Gordy Savers  MD on 09/08/2009   Method used:   Print then Give to Patient   RxID:   [id number] ALTACE 10 MG CAPS (RAMIPRIL) 1 once daily  #90 x 5   Entered and Authorized by:   Gordy Savers  MD   Signed by:   Gordy Savers  MD on 09/08/2009   Method used:   Print then Give to Patient   RxID:   [id number]

## 2010-08-10 NOTE — Miscellaneous (Signed)
Summary: dir cols cr-age.Marland Kitchenem  Clinical Lists Changes  Medications: Added new medication of MOVIPREP 100 GM  SOLR (PEG-KCL-NACL-NASULF-NA ASC-C) As directed - Signed Rx of MOVIPREP 100 GM  SOLR (PEG-KCL-NACL-NASULF-NA ASC-C) As directed;  #1 x 0;  Signed;  Entered by: Clide Cliff RN;  Authorized by: Mardella Layman MD [facility];  Method used: Electronically to [facility] [id number], [place], [place], [place]  [address], Ph: [phone] or [phone], Fax: [phone] Observations: Added new observation of ALLERGY REV: Done (11/10/2008 8:57)    Prescriptions: MOVIPREP 100 GM  SOLR (PEG-KCL-NACL-NASULF-NA ASC-C) As directed  #1 x 0   Entered by:   Clide Cliff RN   Authorized by:   Mardella Layman MD [facility]   Signed by:   Clide Cliff RN on 11/10/2008   Method used:   Electronically to        [facility] [address] [phone]* (retail)       [address]       [place], [place]  [address]       Ph: [phone] or [phone]       Fax: [phone]   RxID:   [id number]

## 2010-08-10 NOTE — Procedures (Signed)
Summary: Colonoscopy   Colonoscopy  Procedure date:  11/19/2008  Findings:      Location:  [facility].    Procedures Next Due Date:    Colonoscopy: 11/2018  COLONOSCOPY PROCEDURE REPORT  PATIENT:  Patrick Wilcox, Patrick Wilcox  MR#:  [id number] BIRTHDATE:   1946/06/15, 63 yrs. old   GENDER:   male  ENDOSCOPIST:   Vania Rea. Jarold Motto, MD, [facility] Referred by: Eleonore Chiquito, M.D.  PROCEDURE DATE:  11/19/2008 PROCEDURE:  Colonoscopy, diagnostic ASA CLASS:   Class II INDICATIONS: screening   MEDICATIONS:    Fentanyl 75 mcg IV, Versed 9 mg IV  DESCRIPTION OF PROCEDURE:   After the risks benefits and alternatives of the procedure were thoroughly explained, informed consent was obtained.  Digital rectal exam was performed and revealed no abnormalities.   The LB [id number] endoscope was introduced through the anus and advanced to the cecum, which was identified by both the appendix and ileocecal valve, limited by a tortuous and redundant colon.    The quality of the prep was excellent, using MoviPrep.  The instrument was then slowly withdrawn as the colon was fully examined. <<PROCEDUREIMAGES>>  <<OLD IMAGES>>  FINDINGS:  Moderate diverticulosis was found sigmoid to descending  This was otherwise a normal examination of the colon.   Retroflexed views in the rectum revealed no abnormalities.    The scope was then withdrawn from the patient and the procedure completed.  COMPLICATIONS:   None  ENDOSCOPIC IMPRESSION:  1) Moderate diverticulosis in the sigmoid to descending  2) Otherwise normal examination RECOMMENDATIONS:  1) You should continue follow current colorectal cancer screening guidelines for "routine risk" patients with a repeat colonoscopy in 10 years. I do not recommend other colon cancer screening prior to then (including stool tests for microscopic blood) unless new symptoms arise.      2) high fiber diet  REPEAT EXAM:    No   _______________________________ Vania Rea. Jarold Motto, MD, [facility]  CC: Eleonore Chiquito, MD

## 2010-08-10 NOTE — Assessment & Plan Note (Signed)
Summary: 3 month rov/njr   Vital Signs:  Patient Profile:   65 Years Old Male Height:     72 inches (182.88 cm) Weight:      206 pounds Temp:     98.1 degrees F BP sitting:   148 / 84  (left arm) Cuff size:   regular  Vitals Entered By: Raechel Ache, RN (Dec 05, 2007 8:12 AM)                 Chief Complaint:  ROV. BS in 90's. C/o toe pain off & on..  History of Present Illness: 65 year old patient seen today for follow-up.  He has type 2 diabetes, hypertension, and gout.  He is doing quite well.  Last ROP was for a full physical with comprehensive lab.  Hemoglobin A1c was 6.2.  Patient is doing well.  No cardiopulmonary complaints.    Current Allergies: No known allergies   Past Medical History:    Reviewed history from 11/23/2006 and no changes required:       Coronary artery disease       Diabetes mellitus, type II       Hyperlipidemia       Hypertension       Myocardial infarction, hx of   Family History:    Reviewed history from 02/21/2007 and no changes required:       both parents died of coronary artery disease; mother had diabeteshe is one of 12 siblings, strongly positive for diabetes       Family History of CAD Male 1st degree relative <60    Review of Systems  The patient denies anorexia, fever, weight loss, weight gain, vision loss, decreased hearing, hoarseness, chest pain, syncope, dyspnea on exertion, peripheral edema, prolonged cough, headaches, hemoptysis, abdominal pain, melena, hematochezia, severe indigestion/heartburn, hematuria, incontinence, genital sores, muscle weakness, suspicious skin lesions, transient blindness, difficulty walking, depression, unusual weight change, abnormal bleeding, enlarged lymph nodes, angioedema, breast masses, and testicular masses.         only complaint is some stiffness involving his left fourth PIP joint and some occasional discomfort involving his left toe   Physical Exam  General:  Well-developed,well-nourished,in no acute distress; alert,appropriate and cooperative throughout examination Head:     Normocephalic and atraumatic without obvious abnormalities. No apparent alopecia or balding. Mouth:     Oral mucosa and oropharynx without lesions or exudates.  Teeth in good repair. Neck:     No deformities, masses, or tenderness noted. Lungs:     Normal respiratory effort, chest expands symmetrically. Lungs are clear to auscultation, no crackles or wheezes. Heart:     Normal rate and regular rhythm. S1 and S2 normal without gallop, murmur, click, rub or other extra sounds. Abdomen:     Bowel sounds positive,abdomen soft and non-tender without masses, organomegaly or hernias noted. Msk:     No deformity or scoliosis noted of thoracic or lumbar spine.   Pulses:     R and L carotid,radial,femoral,dorsalis pedis and posterior tibial pulses are full and equal bilaterally Extremities:     No clubbing, cyanosis, edema, or deformity noted with normal full range of motion of all joints.      Impression & Recommendations:  Problem # 1:  MYOCARDIAL INFARCTION, HX OF (ICD-412)  His updated medication list for this problem includes:    Altace 10 Mg Caps (Ramipril) .Marland Kitchen... 1 once daily    Amlodipine Besylate 5 Mg Tabs (Amlodipine besylate) .Marland Kitchen... Take 1 tablet by mouth once  a day    Bayer Aspirin 325 Mg Tabs (Aspirin) .Marland Kitchen... Take 1 tablet by mouth once a day    Metoprolol Tartrate 100 Mg Tabs (Metoprolol tartrate) .Marland Kitchen... Take 1 tablet by mouth twice a day   Problem # 2:  HYPERTENSION (ICD-401.9)  His updated medication list for this problem includes:    Altace 10 Mg Caps (Ramipril) .Marland Kitchen... 1 once daily    Amlodipine Besylate 5 Mg Tabs (Amlodipine besylate) .Marland Kitchen... Take 1 tablet by mouth once a day    Metoprolol Tartrate 100 Mg Tabs (Metoprolol tartrate) .Marland Kitchen... Take 1 tablet by mouth twice a day   Problem # 3:  HYPERLIPIDEMIA (ICD-272.4)  His updated medication list for this  problem includes:    Lipitor 40 Mg Tabs (Atorvastatin calcium) .Marland Kitchen... 1 once daily   Problem # 4:  DIABETES MELLITUS, TYPE II (ICD-250.00)  His updated medication list for this problem includes:    Altace 10 Mg Caps (Ramipril) .Marland Kitchen... 1 once daily    Bayer Aspirin 325 Mg Tabs (Aspirin) .Marland Kitchen... Take 1 tablet by mouth once a day    Glimepiride 4 Mg Tabs (Glimepiride) .Marland Kitchen... Take 1 tablet by mouth twice a day    Metformin Hcl 500 Mg Tabs (Metformin hcl) .Marland Kitchen... 1 two times a day  Orders: Venipuncture ([address]) TLB-A1C / Hgb A1C (Glycohemoglobin) (83036-A1C)   Complete Medication List: 1)  Altace 10 Mg Caps (Ramipril) .Marland Kitchen.. 1 once daily 2)  Amlodipine Besylate 5 Mg Tabs (Amlodipine besylate) .... Take 1 tablet by mouth once a day 3)  Bayer Aspirin 325 Mg Tabs (Aspirin) .... Take 1 tablet by mouth once a day 4)  Colchicine 0.6 Mg Tabs (Colchicine) .... Take 1 tablet by mouth twice a day 5)  Glimepiride 4 Mg Tabs (Glimepiride) .... Take 1 tablet by mouth twice a day 6)  Lipitor 40 Mg Tabs (Atorvastatin calcium) .Marland Kitchen.. 1 once daily 7)  Metformin Hcl 500 Mg Tabs (Metformin hcl) .Marland Kitchen.. 1 two times a day 8)  Metoprolol Tartrate 100 Mg Tabs (Metoprolol tartrate) .... Take 1 tablet by mouth twice a day 9)  Freestyle Test Strips  .... Use two times a day 10)  Allopurinol 300 Mg Tabs (Allopurinol) .Marland Kitchen.. 1 once daily   Patient Instructions: 1)  Please schedule a follow-up appointment in 3 months. 2)  Limit your Sodium (Salt) to less than 2 grams a day(slightly less than 1/2 a teaspoon) to prevent fluid retention, swelling, or worsening of symptoms. 3)  It is important that you exercise regularly at least 20 minutes 5 times a week. If you develop chest pain, have severe difficulty breathing, or feel very tired , stop exercising immediately and seek medical attention. 4)  Check your blood sugars regularly. If your readings are usually above : or below 70 you should contact our office. 5)  It is important that your  Diabetic A1c level is checked every 3 months. 6)  See your eye doctor yearly to check for diabetic eye damage.   ]

## 2010-08-10 NOTE — Assessment & Plan Note (Signed)
Summary: cpx/nta   Vital Signs:  Patient Profile:   65 Years Old Male Height:     72 inches (182.88 cm) Weight:      202 pounds Temp:     97.9 degrees F oral BP sitting:   156 / 80  (left arm)  Vitals Entered By: Raechel Ache, RN (September 03, 2007 8:45 AM)                 Chief Complaint:  CPX and labs done. Getting over a cold. Having trouble with fingers- hurt and stiff in the morning.Marland Kitchen  History of Present Illness: 65 year old patient seen today for an annual exam.  He has a history of coronary artery disease and presented with an acute MI in October of 2004, and this was followed by CABG in June 2005.  She underwent PTCA with stenting of the first obtuse marginal branch.  He has a history of hypertension, diabetes, and gout.  Onset of new onset diabetes in April 2006    Current Allergies: No known allergies   Past Surgical History:    Coronary artery bypass graft (2004)    PTCA/stent (2005)    hand surgery, right hand following a traumatic injury    remote prior sigmoidoscopies, but no recent colonoscopy   Social History:    works part-time with a [employer] and is quite active physically    Review of Systems       recent URI with some mild diarrhea.  Also complains of some pain and stiffness involving his left hand   Physical Exam  General:     Well-developed,well-nourished,in no acute distress; alert,appropriate and cooperative throughout examination Head:     Normocephalic and atraumatic without obvious abnormalities. No apparent alopecia or balding. Eyes:     No corneal or conjunctival inflammation noted. EOMI. Perrla. Funduscopic exam benign, without hemorrhages, exudates or papilledema. Vision grossly normal. Ears:     External ear exam shows no significant lesions or deformities.  Otoscopic examination reveals clear canals, tympanic membranes are intact bilaterally without bulging, retraction, inflammation or discharge. Hearing is grossly  normal bilaterally. Nose:     External nasal examination shows no deformity or inflammation. Nasal mucosa are pink and moist without lesions or exudates. Mouth:     multiple missing dentition Neck:     No deformities, masses, or tenderness noted. Chest Wall:     No deformities, masses, tenderness or gynecomastia noted. Breasts:     No masses or gynecomastia noted Lungs:     Normal respiratory effort, chest expands symmetrically. Lungs are clear to auscultation, no crackles or wheezes. Heart:     Normal rate and regular rhythm. S1 and S2 normal without gallop, murmur, click, rub or other extra sounds. Abdomen:     Bowel sounds positive,abdomen soft and non-tender without masses, organomegaly or hernias noted. Rectal:     No external abnormalities noted. Normal sphincter tone. No rectal masses or tenderness. Genitalia:     Testes bilaterally descended without nodularity, tenderness or masses. No scrotal masses or lesions. No penis lesions or urethral discharge. Prostate:     Prostate gland firm and smooth, no enlargement, nodularity, tenderness, mass, asymmetry or induration. Msk:     No deformity or scoliosis noted of thoracic or lumbar spine.   Pulses:     the right dorsalis pedis pulse was full.  The peripheral pulses were not easily palpable Extremities:     No clubbing, cyanosis, edema, or deformity noted with normal full  range of motion of all joints.   Neurologic:     No cranial nerve deficits noted. Station and gait are normal. Plantar reflexes are down-going bilaterally. DTRs are symmetrical throughout. Sensory, motor and coordinative functions appear intact. Skin:     Intact without suspicious lesions or rashes Cervical Nodes:     No lymphadenopathy noted Axillary Nodes:     No palpable lymphadenopathy Inguinal Nodes:     No significant adenopathy Psych:     Cognition and judgment appear intact. Alert and cooperative with normal attention span and concentration. No  apparent delusions, illusions, hallucinations    Impression & Recommendations:  Problem # 1:  MYOCARDIAL INFARCTION, HX OF (ICD-412)  His updated medication list for this problem includes:    Altace 10 Mg Caps (Ramipril) .Marland Kitchen... 1 once daily    Amlodipine Besylate 5 Mg Tabs (Amlodipine besylate) .Marland Kitchen... Take 1 tablet by mouth once a day    Bayer Aspirin 325 Mg Tabs (Aspirin) .Marland Kitchen... Take 1 tablet by mouth once a day    Metoprolol Tartrate 100 Mg Tabs (Metoprolol tartrate) .Marland Kitchen... Take 1 tablet by mouth twice a day   Problem # 2:  HYPERTENSION (ICD-401.9)  His updated medication list for this problem includes:    Altace 10 Mg Caps (Ramipril) .Marland Kitchen... 1 once daily    Amlodipine Besylate 5 Mg Tabs (Amlodipine besylate) .Marland Kitchen... Take 1 tablet by mouth once a day    Metoprolol Tartrate 100 Mg Tabs (Metoprolol tartrate) .Marland Kitchen... Take 1 tablet by mouth twice a day   Problem # 3:  DIABETES MELLITUS, TYPE II (ICD-250.00)  His updated medication list for this problem includes:    Altace 10 Mg Caps (Ramipril) .Marland Kitchen... 1 once daily    Bayer Aspirin 325 Mg Tabs (Aspirin) .Marland Kitchen... Take 1 tablet by mouth once a day    Glimepiride 4 Mg Tabs (Glimepiride) .Marland Kitchen... Take 1 tablet by mouth twice a day    Metformin Hcl 500 Mg Tabs (Metformin hcl) .Marland Kitchen... 1 two times a day   Problem # 4:  HYPERLIPIDEMIA (ICD-272.4)  His updated medication list for this problem includes:    Lipitor 40 Mg Tabs (Atorvastatin calcium) .Marland Kitchen... 1 once daily   Complete Medication List: 1)  Altace 10 Mg Caps (Ramipril) .Marland Kitchen.. 1 once daily 2)  Amlodipine Besylate 5 Mg Tabs (Amlodipine besylate) .... Take 1 tablet by mouth once a day 3)  Bayer Aspirin 325 Mg Tabs (Aspirin) .... Take 1 tablet by mouth once a day 4)  Colchicine 0.6 Mg Tabs (Colchicine) .... Take 1 tablet by mouth twice a day 5)  Glimepiride 4 Mg Tabs (Glimepiride) .... Take 1 tablet by mouth twice a day 6)  Lipitor 40 Mg Tabs (Atorvastatin calcium) .Marland Kitchen.. 1 once daily 7)  Metformin Hcl  500 Mg Tabs (Metformin hcl) .Marland Kitchen.. 1 two times a day 8)  Metoprolol Tartrate 100 Mg Tabs (Metoprolol tartrate) .... Take 1 tablet by mouth twice a day 9)  Freestyle Test Strips  .... Use two times a day 10)  Allopurinol 300 Mg Tabs (Allopurinol) .Marland Kitchen.. 1 once daily  Other Orders: EKG w/ Interpretation (93000)   Patient Instructions: 1)  Please schedule a follow-up appointment in 3 months. 2)  Limit your Sodium (Salt). 3)  It is important that you exercise regularly at least 20 minutes 5 times a week. If you develop chest pain, have severe difficulty breathing, or feel very tired , stop exercising immediately and seek medical attention. 4)  Schedule a colonoscopy/sigmoidoscopy to help detect  colon cancer. 5)  Check your blood sugars regularly. If your readings are usually above : or below 70 you should contact our office. 6)  It is important that your Diabetic A1c level is checked every 3 months.    Prescriptions: ALLOPURINOL 300 MG  TABS (ALLOPURINOL) 1 once daily  #90 x 6   Entered and Authorized by:   Gordy Savers  MD   Signed by:   Gordy Savers  MD on 09/03/2007   Method used:   Print then Give to Patient   RxID:   [id number] FREESTYLE TEST STRIPS use two times a day  #100 x 6   Entered and Authorized by:   Gordy Savers  MD   Signed by:   Gordy Savers  MD on 09/03/2007   Method used:   Print then Give to Patient   RxID:   [id number] METOPROLOL TARTRATE 100 MG TABS (METOPROLOL TARTRATE) Take 1 tablet by mouth twice a day  #180 x 6   Entered and Authorized by:   Gordy Savers  MD   Signed by:   Gordy Savers  MD on 09/03/2007   Method used:   Print then Give to Patient   RxID:   [id number] METFORMIN HCL 500 MG TABS (METFORMIN HCL) 1 two times a day  #180 x 6   Entered and Authorized by:   Gordy Savers  MD   Signed by:   Gordy Savers  MD on 09/03/2007   Method used:   Print then Give to Patient   RxID:    [id number] LIPITOR 40 MG TABS (ATORVASTATIN CALCIUM) 1 once daily  #90 x 6   Entered and Authorized by:   Gordy Savers  MD   Signed by:   Gordy Savers  MD on 09/03/2007   Method used:   Print then Give to Patient   RxID:   [id number] GLIMEPIRIDE 4 MG TABS (GLIMEPIRIDE) Take 1 tablet by mouth twice a day  #90 x 6   Entered and Authorized by:   Gordy Savers  MD   Signed by:   Gordy Savers  MD on 09/03/2007   Method used:   Print then Give to Patient   RxID:   [id number] COLCHICINE 0.6 MG TABS (COLCHICINE) Take 1 tablet by mouth twice a day  #180 x 6   Entered and Authorized by:   Gordy Savers  MD   Signed by:   Gordy Savers  MD on 09/03/2007   Method used:   Print then Give to Patient   RxID:   [id number] AMLODIPINE BESYLATE 5 MG TABS (AMLODIPINE BESYLATE) Take 1 tablet by mouth once a day  #90 x 6   Entered and Authorized by:   Gordy Savers  MD   Signed by:   Gordy Savers  MD on 09/03/2007   Method used:   Print then Give to Patient   RxID:   [id number] ALTACE 10 MG CAPS (RAMIPRIL) 1 once daily  #90 x 6   Entered and Authorized by:   Gordy Savers  MD   Signed by:   Gordy Savers  MD on 09/03/2007   Method used:   Print then Give to Patient   RxID:   [id number]  ]

## 2010-08-10 NOTE — Assessment & Plan Note (Signed)
Summary: 3 month rov/njr rsc bmp/njr   Vital Signs:  Patient Profile:   65 Years Old Male Height:     72 inches (182.88 cm) Weight:      215 pounds Temp:     98 degrees F oral BP sitting:   142 / 70  (left arm) Cuff size:   regular  Vitals Entered By: Raechel Ache, RN (February 15, 2008 9:53 AM)                 Chief Complaint:  ROV and FBS 113.  History of Present Illness: 65 year old patient seen today for follow-up.  He has coronary artery disease, diabetes, and hypertension.  He has done well. fasting blood sugars are generally normal.  He denies any cardiopulmonary complaints    Current Allergies: No known allergies       Physical Exam  General:     Well-developed,well-nourished,in no acute distress; alert,appropriate and cooperative throughout examination; 130/70 Head:     Normocephalic and atraumatic without obvious abnormalities. No apparent alopecia or balding. Mouth:     Oral mucosa and oropharynx without lesions or exudates.  Teeth in good repair. Neck:     No deformities, masses, or tenderness noted. Chest Wall:     No deformities, masses, tenderness or gynecomastia noted. Lungs:     Normal respiratory effort, chest expands symmetrically. Lungs are clear to auscultation, no crackles or wheezes. Heart:     Normal rate and regular rhythm. S1 and S2 normal without gallop, murmur, click, rub or other extra sounds. Abdomen:     Bowel sounds positive,abdomen soft and non-tender without masses, organomegaly or hernias noted.    Impression & Recommendations:  Problem # 1:  HYPERTENSION (ICD-401.9)  His updated medication list for this problem includes:    Altace 10 Mg Caps (Ramipril) .Marland Kitchen... 1 once daily    Amlodipine Besylate 5 Mg Tabs (Amlodipine besylate) .Marland Kitchen... Take 1 tablet by mouth once a day    Metoprolol Tartrate 100 Mg Tabs (Metoprolol tartrate) .Marland Kitchen... Take 1 tablet by mouth twice a day   Problem # 2:  DIABETES MELLITUS, TYPE II  (ICD-250.00)  His updated medication list for this problem includes:    Altace 10 Mg Caps (Ramipril) .Marland Kitchen... 1 once daily    Bayer Aspirin 325 Mg Tabs (Aspirin) .Marland Kitchen... Take 1 tablet by mouth once a day    Glimepiride 4 Mg Tabs (Glimepiride) .Marland Kitchen... Take 1 tablet by mouth twice a day    Metformin Hcl 500 Mg Tabs (Metformin hcl) .Marland Kitchen... 1 two times a day  Orders: Venipuncture (40981) TLB-A1C / Hgb A1C (Glycohemoglobin) (83036-A1C)   Complete Medication List: 1)  Altace 10 Mg Caps (Ramipril) .Marland Kitchen.. 1 once daily 2)  Amlodipine Besylate 5 Mg Tabs (Amlodipine besylate) .... Take 1 tablet by mouth once a day 3)  Bayer Aspirin 325 Mg Tabs (Aspirin) .... Take 1 tablet by mouth once a day 4)  Colchicine 0.6 Mg Tabs (Colchicine) .... Take 1 tablet by mouth twice a day 5)  Glimepiride 4 Mg Tabs (Glimepiride) .... Take 1 tablet by mouth twice a day 6)  Lipitor 40 Mg Tabs (Atorvastatin calcium) .Marland Kitchen.. 1 once daily 7)  Metformin Hcl 500 Mg Tabs (Metformin hcl) .Marland Kitchen.. 1 two times a day 8)  Metoprolol Tartrate 100 Mg Tabs (Metoprolol tartrate) .... Take 1 tablet by mouth twice a day 9)  Freestyle Test Strips  .... Use two times a day 10)  Allopurinol 300 Mg Tabs (Allopurinol) .Marland Kitchen.. 1 once  daily 11)  Freestyle Unistick Ii Lancets Misc (Lancets)   Patient Instructions: 1)  Please schedule a follow-up appointment in 3 months. 2)  It is important that you exercise regularly at least 20 minutes 5 times a week. If you develop chest pain, have severe difficulty breathing, or feel very tired , stop exercising immediately and seek medical attention. 3)  Check your blood sugars regularly. If your readings are usually above : or below 70 you should contact our office. 4)  It is important that your Diabetic A1c level is checked every 3 months. 5)  See your eye doctor yearly to check for diabetic eye damage.   Prescriptions: FREESTYLE UNISTICK II LANCETS   MISC (LANCETS)   #100 x 6   Entered and Authorized by:   Gordy Savers  MD   Signed by:   Gordy Savers  MD on 02/15/2008   Method used:   Print then Give to Patient   RxID:   [id number] FREESTYLE TEST STRIPS use two times a day  #100 x 6   Entered and Authorized by:   Gordy Savers  MD   Signed by:   Gordy Savers  MD on 02/15/2008   Method used:   Print then Give to Patient   RxID:   [id number]  ]

## 2010-08-10 NOTE — Assessment & Plan Note (Signed)
Summary: 3 month rov/njr   Vital Signs:  Patient profile:   65 year old male Weight:      228 pounds Temp:     98.2 degrees F oral BP sitting:   130 / 90  (left arm) Cuff size:   large  Vitals Entered By: Duard Brady LPN (June 15, 2010 8:40 AM) dCC: 3 month rov--doing ok    fbs 139 Is Patient Diabetic? Yes   CC:  3 month rov--doing ok    fbs 139.  History of Present Illness: 1 -year-old patient who is seen today for follow-up of his type 2 diabetes.  His last hemoglobin A1c was elevated at 7.9.  Lifestyle modification.  Encouraged.  Unfortunately, there is been some modest weight gain, and no real improvement in his exercise level.  He has coronary artery disease, which has been stable.  He is treated dyslipidemia, controlled on Lipitor 40 mg daily has hypertension, which has been well-controlled.  There is a history of gout, which has been quiescent.  lifestyle modifications.  Again discussed at length.  Allergies (verified): No Known Drug Allergies  Past History:  Past Medical History: Reviewed history from 12/22/2008 and no changes required. Coronary artery disease Diabetes mellitus, type II Hyperlipidemia Hypertension Myocardial infarction, hx of Gout low HDL cholesterol  Past Surgical History: Reviewed history from 09/03/2007 and no changes required. Coronary artery bypass graft (2004) PTCA/stent (2005) hand surgery, right hand following a traumatic injury remote prior sigmoidoscopies, but no recent colonoscopy  Family History: Reviewed history from 02/21/2007 and no changes required. both parents died of coronary artery disease; mother had diabeteshe is one of 12 siblings, strongly positive for diabetes Family History of CAD Male 1st degree relative <60  Social History: Reviewed history from 09/03/2007 and no changes required. works part-time with a [employer] and is quite active physically  Review of Systems  The patient denies  anorexia, fever, weight loss, weight gain, vision loss, decreased hearing, hoarseness, chest pain, syncope, dyspnea on exertion, peripheral edema, prolonged cough, headaches, hemoptysis, abdominal pain, melena, hematochezia, severe indigestion/heartburn, hematuria, incontinence, genital sores, muscle weakness, suspicious skin lesions, transient blindness, difficulty walking, depression, unusual weight change, abnormal bleeding, enlarged lymph nodes, angioedema, breast masses, and testicular masses.    Physical Exam  General:  overweight-appearing.  normal blood pressureoverweight-appearing.   Head:  Normocephalic and atraumatic without obvious abnormalities. No apparent alopecia or balding. Eyes:  No corneal or conjunctival inflammation noted. EOMI. Perrla. Funduscopic exam benign, without hemorrhages, exudates or papilledema. Vision grossly normal. Mouth:  Oral mucosa and oropharynx without lesions or exudates.  Teeth in good repair. Neck:  No deformities, masses, or tenderness noted. Lungs:  Normal respiratory effort, chest expands symmetrically. Lungs are clear to auscultation, no crackles or wheezes. Heart:  Normal rate and regular rhythm. S1 and S2 normal without gallop, murmur, click, rub or other extra sounds. Abdomen:  Bowel sounds positive,abdomen soft and non-tender without masses, organomegaly or hernias noted. Msk:  No deformity or scoliosis noted of thoracic or lumbar spine.   Pulses:  R and L carotid,radial,femoral,dorsalis pedis and posterior tibial pulses are full and equal bilaterally Extremities:  No clubbing, cyanosis, edema, or deformity noted with normal full range of motion of all joints.   Skin:  Intact without suspicious lesions or rashes Cervical Nodes:  No lymphadenopathy noted Psych:  Cognition and judgment appear intact. Alert and cooperative with normal attention span and concentration. No apparent delusions, illusions, hallucinations  Diabetes Management Exam:  Eye  Exam:       Eye Exam done here today          Results: normal   Impression & Recommendations:  Problem # 1:  HYPERTENSION (ICD-401.9)  His updated medication list for this problem includes:    Altace 10 Mg Caps (Ramipril) .Marland Kitchen... 1 once daily    Amlodipine Besylate 5 Mg Tabs (Amlodipine besylate) .Marland Kitchen... Take 1 tablet by mouth once a day    Metoprolol Tartrate 100 Mg Tabs (Metoprolol tartrate) .Marland Kitchen... Take 1 tablet by mouth twice a day  His updated medication list for this problem includes:    Altace 10 Mg Caps (Ramipril) .Marland Kitchen... 1 once daily    Amlodipine Besylate 5 Mg Tabs (Amlodipine besylate) .Marland Kitchen... Take 1 tablet by mouth once a day    Metoprolol Tartrate 100 Mg Tabs (Metoprolol tartrate) .Marland Kitchen... Take 1 tablet by mouth twice a day  Problem # 2:  HYPERLIPIDEMIA (ICD-272.4)  His updated medication list for this problem includes:    Lipitor 40 Mg Tabs (Atorvastatin calcium) .Marland Kitchen... 1 once daily  His updated medication list for this problem includes:    Lipitor 40 Mg Tabs (Atorvastatin calcium) .Marland Kitchen... 1 once daily  Problem # 3:  DIABETES MELLITUS, TYPE II (ICD-250.00)  His updated medication list for this problem includes:    Altace 10 Mg Caps (Ramipril) .Marland Kitchen... 1 once daily    Bayer Aspirin 325 Mg Tabs (Aspirin) .Marland Kitchen... Take 1 tablet by mouth once a day    Glimepiride 4 Mg Tabs (Glimepiride) .Marland Kitchen... Take 1 tablet by mouth twice a day    Januvia 100 Mg Tabs (Sitagliptin phosphate) .Marland Kitchen... 1 once daily    His updated medication list for this problem includes:    Altace 10 Mg Caps (Ramipril) .Marland Kitchen... 1 once daily    Bayer Aspirin 325 Mg Tabs (Aspirin) .Marland Kitchen... Take 1 tablet by mouth once a day    Glimepiride 4 Mg Tabs (Glimepiride) .Marland Kitchen... Take 1 tablet by mouth twice a day    Januvia 100 Mg Tabs (Sitagliptin phosphate) .Marland Kitchen... 1 once daily  Orders: Venipuncture (16109) TLB-A1C / Hgb A1C (Glycohemoglobin) (83036-A1C) Specimen Handling (60454)  Problem # 4:  CORONARY ARTERY DISEASE (ICD-414.00)  His  updated medication list for this problem includes:    Altace 10 Mg Caps (Ramipril) .Marland Kitchen... 1 once daily    Amlodipine Besylate 5 Mg Tabs (Amlodipine besylate) .Marland Kitchen... Take 1 tablet by mouth once a day    Bayer Aspirin 325 Mg Tabs (Aspirin) .Marland Kitchen... Take 1 tablet by mouth once a day    Metoprolol Tartrate 100 Mg Tabs (Metoprolol tartrate) .Marland Kitchen... Take 1 tablet by mouth twice a day  His updated medication list for this problem includes:    Altace 10 Mg Caps (Ramipril) .Marland Kitchen... 1 once daily    Amlodipine Besylate 5 Mg Tabs (Amlodipine besylate) .Marland Kitchen... Take 1 tablet by mouth once a day    Bayer Aspirin 325 Mg Tabs (Aspirin) .Marland Kitchen... Take 1 tablet by mouth once a day    Metoprolol Tartrate 100 Mg Tabs (Metoprolol tartrate) .Marland Kitchen... Take 1 tablet by mouth twice a day  Complete Medication List: 1)  Altace 10 Mg Caps (Ramipril) .Marland Kitchen.. 1 once daily 2)  Amlodipine Besylate 5 Mg Tabs (Amlodipine besylate) .... Take 1 tablet by mouth once a day 3)  Bayer Aspirin 325 Mg Tabs (Aspirin) .... Take 1 tablet by mouth once a day 4)  Colchicine 0.6 Mg Tabs (Colchicine) .... Take 1 tablet by mouth daily 5)  Glimepiride  4 Mg Tabs (Glimepiride) .... Take 1 tablet by mouth twice a day 6)  Lipitor 40 Mg Tabs (Atorvastatin calcium) .Marland Kitchen.. 1 once daily 7)  Metformin Hcl 1000 Mg Tabs (metformin Hcl)  .... One tablet two  times a day 8)  Metoprolol Tartrate 100 Mg Tabs (Metoprolol tartrate) .... Take 1 tablet by mouth twice a day 9)  Freestyle Test Strips  .... Use two times a day 10)  Allopurinol 300 Mg Tabs (Allopurinol) .Marland Kitchen.. 1 once daily 11)  Freestyle Unistick Ii Lancets Misc (Lancets) 12)  Januvia 100 Mg Tabs (Sitagliptin phosphate) .Marland Kitchen.. 1 once daily 13)  Colcrys 0.6 Mg Tabs (Colchicine) .... Qd 14)  Azithromycin 250 Mg Tabs (Azithromycin) .... Two initially, then one daily for 4 additional days  Other Orders: Flu Vaccine 31yrs + (13086) Admin 1st Vaccine (57846)  Patient Instructions: 1)  Please schedule a follow-up appointment  in 3 months for CPX  2)  Limit your Sodium (Salt) to less than 2 grams a day(slightly less than 1/2 a teaspoon) to prevent fluid retention, swelling, or worsening of symptoms. 3)  It is important that you exercise regularly at least 20 minutes 5 times a week. If you develop chest pain, have severe difficulty breathing, or feel very tired , stop exercising immediately and seek medical attention. 4)  You need to lose weight. Consider a lower calorie diet and regular exercise.  5)  Check your blood sugars regularly. If your readings are usually above : or below 70 you should contact our office. 6)  It is important that your Diabetic A1c level is checked every 3 months. 7)  See your eye doctor yearly to check for diabetic eye damage.   Orders Added: 1)  Flu Vaccine 44yrs + [90658] 2)  Admin 1st Vaccine [90471] 3)  Est. Patient Level IV [96295] 4)  Venipuncture [36415] 5)  TLB-A1C / Hgb A1C (Glycohemoglobin) [83036-A1C] 6)  Specimen Handling [99000]   Immunizations Administered:  Influenza Vaccine # 1:    Vaccine Type: Fluvax 3+    Site: left deltoid    Mfr: [employer]oSmith[employer]    Dose: 0.5 ml    Route: IM    Given by: Duard Brady LPN    Exp. Date: 01/08/2011    Lot #: [id number]    VIS given: 02/02/10 version given June 15, 2010.    Physician counseled: yes  Flu Vaccine Consent Questions:    Do you have a history of severe allergic reactions to this vaccine? no    Any prior history of allergic reactions to egg and/or gelatin? no    Do you have a sensitivity to the preservative Thimersol? no    Do you have a past history of Guillan-Barre Syndrome? no    Do you currently have an acute febrile illness? no    Have you ever had a severe reaction to latex? no    Vaccine information given and explained to patient? yes   Immunizations Administered:  Influenza Vaccine # 1:    Vaccine Type: Fluvax 3+    Site: left deltoid    Mfr: [employer]    Dose: 0.5 ml    Route: IM     Given by: Duard Brady LPN    Exp. Date: 01/08/2011    Lot #: [id number]    VIS given: 02/02/10 version given June 15, 2010.    Physician counseled: yes

## 2010-08-10 NOTE — Assessment & Plan Note (Signed)
Summary: 3 month rov/njr   Vital Signs:  Patient Profile:   65 Years Old Male Height:     72 inches (182.88 cm) Weight:      216 pounds Temp:     98.5 degrees F oral BP sitting:   144 / 70  (left arm) Cuff size:   regular  Vitals Entered By: Raechel Ache, RN (May 20, 2008 8:37 AM)                 Chief Complaint:  3 mo ROV. BS 102.Marland Kitchen  History of Present Illness: a 65 year old patient seen today for follow-up.  He has coronary artery disease, status post CABG, which has been stable.  Denies any exertional chest pain.  He has type 2 diabetes, which also has been stable and fasting blood sugar this morning, 101.  His lasting over A1c was 6.4.  History of hypertension and dyslipidemia.  He is history of gout, which has been stable since starting the allopurinol therapy    Current Allergies: No known allergies   Past Medical History:    Coronary artery disease    Diabetes mellitus, type II    Hyperlipidemia    Hypertension    Myocardial infarction, hx of    Gout   Family History:    Reviewed history from 02/21/2007 and no changes required:       both parents died of coronary artery disease; mother had diabeteshe is one of 12 siblings, strongly positive for diabetes       Family History of CAD Male 1st degree relative <60    Review of Systems  The patient denies anorexia, fever, weight loss, weight gain, vision loss, decreased hearing, hoarseness, chest pain, syncope, dyspnea on exertion, peripheral edema, prolonged cough, headaches, hemoptysis, abdominal pain, melena, hematochezia, severe indigestion/heartburn, hematuria, incontinence, genital sores, muscle weakness, suspicious skin lesions, transient blindness, difficulty walking, depression, unusual weight change, abnormal bleeding, enlarged lymph nodes, angioedema, breast masses, and testicular masses.     Physical Exam  General:     Well-developed,well-nourished,in no acute distress; alert,appropriate and  cooperative throughout examination; and 134/80 Head:     Normocephalic and atraumatic without obvious abnormalities. No apparent alopecia or balding. Eyes:     No corneal or conjunctival inflammation noted. EOMI. Perrla. Funduscopic exam benign, without hemorrhages, exudates or papilledema. Vision grossly normal. Mouth:     Oral mucosa and oropharynx without lesions or exudates.  Teeth in good repair. Neck:     No deformities, masses, or tenderness noted. Chest Wall:     status post sternotomy Lungs:     Normal respiratory effort, chest expands symmetrically. Lungs are clear to auscultation, no crackles or wheezes. Heart:     Normal rate and regular rhythm. S1 and S2 normal without gallop, murmur, click, rub or other extra sounds. Abdomen:     Bowel sounds positive,abdomen soft and non-tender without masses, organomegaly or hernias noted. Msk:     No deformity or scoliosis noted of thoracic or lumbar spine.   Pulses:     R and L carotid,radial,femoral,dorsalis pedis and posterior tibial pulses are full and equal bilaterally Extremities:     No clubbing, cyanosis, edema, or deformity noted with normal full range of motion of all joints.      Impression & Recommendations:  Problem # 1:  GOUT (ICD-274.9)  His updated medication list for this problem includes:    Colchicine 0.6 Mg Tabs (Colchicine) .Marland Kitchen... Take 1 tablet by mouth twice a day  Allopurinol 300 Mg Tabs (Allopurinol) .Marland Kitchen... 1 once daily clinically stable  Problem # 2:  HYPERTENSION (ICD-401.9)  His updated medication list for this problem includes:    Altace 10 Mg Caps (Ramipril) .Marland Kitchen... 1 once daily    Amlodipine Besylate 5 Mg Tabs (Amlodipine besylate) .Marland Kitchen... Take 1 tablet by mouth once a day    Metoprolol Tartrate 100 Mg Tabs (Metoprolol tartrate) .Marland Kitchen... Take 1 tablet by mouth twice a day   Problem # 3:  HYPERLIPIDEMIA (ICD-272.4)  His updated medication list for this problem includes:    Lipitor 40 Mg Tabs  (Atorvastatin calcium) .Marland Kitchen... 1 once daily   Problem # 4:  DIABETES MELLITUS, TYPE II (ICD-250.00)  His updated medication list for this problem includes:    Altace 10 Mg Caps (Ramipril) .Marland Kitchen... 1 once daily    Bayer Aspirin 325 Mg Tabs (Aspirin) .Marland Kitchen... Take 1 tablet by mouth once a day    Glimepiride 4 Mg Tabs (Glimepiride) .Marland Kitchen... Take 1 tablet by mouth twice a day    Metformin Hcl 500 Mg Tabs (Metformin hcl) .Marland Kitchen... 1 two times a day will check a hemoglobin A1c Orders: Venipuncture (91478) TLB-A1C / Hgb A1C (Glycohemoglobin) (83036-A1C)   Complete Medication List: 1)  Altace 10 Mg Caps (Ramipril) .Marland Kitchen.. 1 once daily 2)  Amlodipine Besylate 5 Mg Tabs (Amlodipine besylate) .... Take 1 tablet by mouth once a day 3)  Bayer Aspirin 325 Mg Tabs (Aspirin) .... Take 1 tablet by mouth once a day 4)  Colchicine 0.6 Mg Tabs (Colchicine) .... Take 1 tablet by mouth twice a day 5)  Glimepiride 4 Mg Tabs (Glimepiride) .... Take 1 tablet by mouth twice a day 6)  Lipitor 40 Mg Tabs (Atorvastatin calcium) .Marland Kitchen.. 1 once daily 7)  Metformin Hcl 500 Mg Tabs (Metformin hcl) .Marland Kitchen.. 1 two times a day 8)  Metoprolol Tartrate 100 Mg Tabs (Metoprolol tartrate) .... Take 1 tablet by mouth twice a day 9)  Freestyle Test Strips  .... Use two times a day 10)  Allopurinol 300 Mg Tabs (Allopurinol) .Marland Kitchen.. 1 once daily 11)  Freestyle Unistick Ii Lancets Misc (Lancets)   Patient Instructions: 1)  Please schedule a follow-up appointment in 4 months. 2)  Limit your Sodium (Salt) to less than 2 grams a day(slightly less than 1/2 a teaspoon) to prevent fluid retention, swelling, or worsening of symptoms. 3)  It is important that you exercise regularly at least 20 minutes 5 times a week. If you develop chest pain, have severe difficulty breathing, or feel very tired , stop exercising immediately and seek medical attention. 4)  Take an Aspirin every day. 5)  Check your blood sugars regularly. If your readings are usually above : or  below 70 you should contact our office. 6)  It is important that your Diabetic A1c level is checked every 3 months. 7)  See your eye doctor yearly to check for diabetic eye damage.   Prescriptions: FREESTYLE UNISTICK II LANCETS   MISC (LANCETS)   #100 x 6   Entered and Authorized by:   Gordy Savers  MD   Signed by:   Gordy Savers  MD on 05/20/2008   Method used:   Print then Give to Patient   RxID:   [id number] ALLOPURINOL 300 MG  TABS (ALLOPURINOL) 1 once daily  #100 x 6   Entered and Authorized by:   Gordy Savers  MD   Signed by:   Gordy Savers  MD on 05/20/2008  Method used:   Print then Give to Patient   RxID:   [id number] FREESTYLE TEST STRIPS use two times a day  #100 x 6   Entered and Authorized by:   Gordy Savers  MD   Signed by:   Gordy Savers  MD on 05/20/2008   Method used:   Print then Give to Patient   RxID:   [id number] METOPROLOL TARTRATE 100 MG TABS (METOPROLOL TARTRATE) Take 1 tablet by mouth twice a day  #180 x 5   Entered and Authorized by:   Gordy Savers  MD   Signed by:   Gordy Savers  MD on 05/20/2008   Method used:   Print then Give to Patient   RxID:   [id number] METFORMIN HCL 500 MG TABS (METFORMIN HCL) 1 two times a day  #180 x 5   Entered and Authorized by:   Gordy Savers  MD   Signed by:   Gordy Savers  MD on 05/20/2008   Method used:   Print then Give to Patient   RxID:   [id number] LIPITOR 40 MG TABS (ATORVASTATIN CALCIUM) 1 once daily  #90 x 5   Entered and Authorized by:   Gordy Savers  MD   Signed by:   Gordy Savers  MD on 05/20/2008   Method used:   Print then Give to Patient   RxID:   [id number] GLIMEPIRIDE 4 MG TABS (GLIMEPIRIDE) Take 1 tablet by mouth twice a day  #90 x 6   Entered and Authorized by:   Gordy Savers  MD   Signed by:   Gordy Savers  MD on 05/20/2008   Method used:   Print then Give to Patient    RxID:   [id number] COLCHICINE 0.6 MG TABS (COLCHICINE) Take 1 tablet by mouth twice a day  #180 x 6   Entered and Authorized by:   Gordy Savers  MD   Signed by:   Gordy Savers  MD on 05/20/2008   Method used:   Print then Give to Patient   RxID:   [id number] AMLODIPINE BESYLATE 5 MG TABS (AMLODIPINE BESYLATE) Take 1 tablet by mouth once a day  #90 x 5   Entered and Authorized by:   Gordy Savers  MD   Signed by:   Gordy Savers  MD on 05/20/2008   Method used:   Print then Give to Patient   RxID:   [id number] ALTACE 10 MG CAPS (RAMIPRIL) 1 once daily  #90 x 5   Entered and Authorized by:   Gordy Savers  MD   Signed by:   Gordy Savers  MD on 05/20/2008   Method used:   Print then Give to Patient   RxID:   [id number]  ]

## 2010-08-10 NOTE — Miscellaneous (Signed)
  Clinical Lists Changes  Medications: Added new medication of * FREESTYLE TEST STRIPS use two times a day

## 2010-08-10 NOTE — Assessment & Plan Note (Signed)
Summary: ROV/MM   Vital Signs:  Patient profile:   65 year old male Weight:      220 pounds Temp:     98.7 degrees F oral BP sitting:   156 / 94  (left arm) Cuff size:   regular  Vitals Entered By: Raechel Ache, RN (June 22, 2009 8:13 AM) CC: 3 mo ROV, BS's 93-120. Is Patient Diabetic? Yes Flu Vaccine Consent Questions     Do you have a history of severe allergic reactions to this vaccine? no    Any prior history of allergic reactions to egg and/or gelatin? no    Do you have a sensitivity to the preservative Thimersol? no    Do you have a past history of Guillan-Barre Syndrome? no    Do you currently have an acute febrile illness? no    Have you ever had a severe reaction to latex? no    Vaccine information given and explained to patient? yes    Are you currently pregnant? no    Lot Number:[id number]   Exp Date:01/07/2010   Site Given  Left Deltoid IM   CC:  3 mo ROV and BS's 93-120.Marland Kitchen  History of Present Illness: 65 year old patient who is seen today for follow up of his type 2 diabetes.  His blood sugars have been under nice control.  Apparently, he is taking metformin only 500 twice daily.  His coronary artery disease, which has been stable.  Denies any chest pain.  He has treated hypertension and dyslipidemia.  Remains on Lipitor 40 mg daily, which she continues to tolerate well.  He has been under situational stress due to the recent loss of his fiance to breast cancer.  Allergies: No Known Drug Allergies  Past History:  Past Medical History: Reviewed history from 12/22/2008 and no changes required. Coronary artery disease Diabetes mellitus, type II Hyperlipidemia Hypertension Myocardial infarction, hx of Gout low HDL cholesterol  Past Surgical History: Reviewed history from 09/03/2007 and no changes required. Coronary artery bypass graft (2004) PTCA/stent (2005) hand surgery, right hand following a traumatic injury remote prior sigmoidoscopies,  but no recent colonoscopy  Family History: Reviewed history from 02/21/2007 and no changes required. both parents died of coronary artery disease; mother had diabeteshe is one of 12 siblings, strongly positive for diabetes Family History of CAD Male 1st degree relative <60  Review of Systems  The patient denies anorexia, fever, weight loss, weight gain, vision loss, decreased hearing, hoarseness, chest pain, syncope, dyspnea on exertion, peripheral edema, prolonged cough, headaches, hemoptysis, abdominal pain, melena, hematochezia, severe indigestion/heartburn, hematuria, incontinence, genital sores, muscle weakness, suspicious skin lesions, transient blindness, difficulty walking, depression, unusual weight change, abnormal bleeding, enlarged lymph nodes, angioedema, breast masses, and testicular masses.    Physical Exam  General:  Well-developed,well-nourished,in no acute distress; alert,appropriate and cooperative throughout examination; 130/84 Head:  Normocephalic and atraumatic without obvious abnormalities. No apparent alopecia or balding. Eyes:  No corneal or conjunctival inflammation noted. EOMI. Perrla. Funduscopic exam benign, without hemorrhages, exudates or papilledema. Vision grossly normal. Mouth:  Oral mucosa and oropharynx without lesions or exudates.  Teeth in good repair. Neck:  No deformities, masses, or tenderness noted. Chest Wall:  No deformities, masses, tenderness or gynecomastia noted. Lungs:  Normal respiratory effort, chest expands symmetrically. Lungs are clear to auscultation, no crackles or wheezes. Heart:  Normal rate and regular rhythm. S1 and S2 normal without gallop, murmur, click, rub or other extra sounds. Abdomen:  Bowel sounds positive,abdomen soft and non-tender  without masses, organomegaly or hernias noted. Msk:  No deformity or scoliosis noted of thoracic or lumbar spine.   Pulses:  R and L carotid,radial,femoral,dorsalis pedis and posterior tibial  pulses are full and equal bilaterally Extremities:  No clubbing, cyanosis, edema, or deformity noted with normal full range of motion of all joints.     Impression & Recommendations:  Problem # 1:  GOUT (ICD-274.9)  His updated medication list for this problem includes:    Colchicine 0.6 Mg Tabs (Colchicine) .Marland Kitchen... Take 1 tablet by mouth daily    Allopurinol 300 Mg Tabs (Allopurinol) .Marland Kitchen... 1 once daily patient has had no recurrent gout will discontinued colchicine   His updated medication list for this problem includes:    Colchicine 0.6 Mg Tabs (Colchicine) .Marland Kitchen... Take 1 tablet by mouth daily    Allopurinol 300 Mg Tabs (Allopurinol) .Marland Kitchen... 1 once daily  Problem # 2:  HYPERTENSION (ICD-401.9)  His updated medication list for this problem includes:    Altace 10 Mg Caps (Ramipril) .Marland Kitchen... 1 once daily    Amlodipine Besylate 5 Mg Tabs (Amlodipine besylate) .Marland Kitchen... Take 1 tablet by mouth once a day    Metoprolol Tartrate 100 Mg Tabs (Metoprolol tartrate) .Marland Kitchen... Take 1 tablet by mouth twice a day  His updated medication list for this problem includes:    Altace 10 Mg Caps (Ramipril) .Marland Kitchen... 1 once daily    Amlodipine Besylate 5 Mg Tabs (Amlodipine besylate) .Marland Kitchen... Take 1 tablet by mouth once a day    Metoprolol Tartrate 100 Mg Tabs (Metoprolol tartrate) .Marland Kitchen... Take 1 tablet by mouth twice a day  Problem # 3:  HYPERLIPIDEMIA (ICD-272.4)  His updated medication list for this problem includes:    Lipitor 40 Mg Tabs (Atorvastatin calcium) .Marland Kitchen... 1 once daily  His updated medication list for this problem includes:    Lipitor 40 Mg Tabs (Atorvastatin calcium) .Marland Kitchen... 1 once daily  Problem # 4:  DIABETES MELLITUS, TYPE II (ICD-250.00)  His updated medication list for this problem includes:    Altace 10 Mg Caps (Ramipril) .Marland Kitchen... 1 once daily    Bayer Aspirin 325 Mg Tabs (Aspirin) .Marland Kitchen... Take 1 tablet by mouth once a day    Glimepiride 4 Mg Tabs (Glimepiride) .Marland Kitchen... Take 1 tablet by mouth twice a day     Januvia 100 Mg Tabs (Sitagliptin phosphate) .Marland Kitchen... 1 once daily    His updated medication list for this problem includes:    Altace 10 Mg Caps (Ramipril) .Marland Kitchen... 1 once daily    Bayer Aspirin 325 Mg Tabs (Aspirin) .Marland Kitchen... Take 1 tablet by mouth once a day    Glimepiride 4 Mg Tabs (Glimepiride) .Marland Kitchen... Take 1 tablet by mouth twice a day    Januvia 100 Mg Tabs (Sitagliptin phosphate) .Marland Kitchen... 1 once daily  Complete Medication List: 1)  Altace 10 Mg Caps (Ramipril) .Marland Kitchen.. 1 once daily 2)  Amlodipine Besylate 5 Mg Tabs (Amlodipine besylate) .... Take 1 tablet by mouth once a day 3)  Bayer Aspirin 325 Mg Tabs (Aspirin) .... Take 1 tablet by mouth once a day 4)  Colchicine 0.6 Mg Tabs (Colchicine) .... Take 1 tablet by mouth daily 5)  Glimepiride 4 Mg Tabs (Glimepiride) .... Take 1 tablet by mouth twice a day 6)  Lipitor 40 Mg Tabs (Atorvastatin calcium) .Marland Kitchen.. 1 once daily 7)  Metformin Hcl 1000 Mg Tabs (metformin Hcl)  .... One tablet two  times a day 8)  Metoprolol Tartrate 100 Mg Tabs (Metoprolol tartrate) .... Take 1  tablet by mouth twice a day 9)  Freestyle Test Strips  .... Use two times a day 10)  Allopurinol 300 Mg Tabs (Allopurinol) .Marland Kitchen.. 1 once daily 11)  Freestyle Unistick Ii Lancets Misc (Lancets) 12)  Moviprep 100 Gm Solr (Peg-kcl-nacl-nasulf-na asc-c) .... As directed 13)  Januvia 100 Mg Tabs (Sitagliptin phosphate) .Marland Kitchen.. 1 once daily  Other Orders: Admin 1st Vaccine (44010) Flu Vaccine 56yrs + (27253) Venipuncture (66440) TLB-A1C / Hgb A1C (Glycohemoglobin) (83036-A1C) Prescription Created Electronically [phone])  Patient Instructions: 1)  Please schedule a follow-up appointment in 3 months. 2)  Limit your Sodium (Salt). 3)  It is important that you exercise regularly at least 20 minutes 5 times a week. If you develop chest pain, have severe difficulty breathing, or feel very tired , stop exercising immediately and seek medical attention. 4)  You need to lose weight. Consider a lower  calorie diet and regular exercise.  5)  Check your blood sugars regularly. If your readings are usually above : or below 70 you should contact our office. 6)  It is important that your Diabetic A1c level is checked every 3 months. 7)  See your eye doctor yearly to check for diabetic eye damage.

## 2010-08-10 NOTE — Assessment & Plan Note (Signed)
Summary: 3 monht rov/njr  a  Vital Signs:  Wilcox profile:   65 year old male Weight:      226 pounds Temp:     98.4 degrees F oral BP sitting:   140 / 80  (right arm) Cuff size:   regular  Vitals Entered By: Duard Brady LPN (March 16, 2010 8:17 AM) CC: 3 mos rov - doing ok Is Wilcox Diabetic? Yes Did you bring your meter with you today? No CBG Result 165   CC:  3 mos rov - doing ok.  History of Present Illness:  Patrick Wilcox who is in today for follow-up.  He has a history of coronary artery disease, status post prior MI.  This has been stable.  He denies any exertional chest pain. He has a history of type 2 diabetes.  His weight is up 4 pounds.  His last hemoglobin A1c7.2.  He is on maximal oral medication. He has a history of gout, which has also been stable.  He remains on albuterol, which he continues to tolerate.  He has had no acute arthritis. He has hypertension, which is controlled on multiple drugs.  Allergies (verified): No Known Drug Allergies  Past History:  Past Medical History: Reviewed history from 12/22/2008 and no changes required. Coronary artery disease Diabetes mellitus, type II Hyperlipidemia Hypertension Myocardial infarction, hx of Gout low HDL cholesterol  Review of Systems       The Wilcox complains of weight gain.  The Wilcox denies anorexia, fever, weight loss, vision loss, decreased hearing, hoarseness, chest pain, syncope, dyspnea on exertion, peripheral edema, prolonged cough, headaches, hemoptysis, abdominal pain, melena, hematochezia, severe indigestion/heartburn, hematuria, incontinence, genital sores, muscle weakness, suspicious skin lesions, transient blindness, difficulty walking, depression, unusual weight change, abnormal bleeding, enlarged lymph nodes, angioedema, breast masses, and testicular masses.    Physical Exam  General:  overweight-appearing.  130/76overweight-appearing.   Head:  Normocephalic and  atraumatic without obvious abnormalities. No apparent alopecia or balding. Eyes:  No corneal or conjunctival inflammation noted. EOMI. Perrla. Funduscopic exam benign, without hemorrhages, exudates or papilledema. Vision grossly normal. Mouth:  Oral mucosa and oropharynx without lesions or exudates.  Teeth in good repair. Neck:  No deformities, masses, or tenderness noted. Chest Wall:  No deformities, masses, tenderness or gynecomastia noted. Lungs:  Normal respiratory effort, chest expands symmetrically. Lungs are clear to auscultation, no crackles or wheezes. Heart:  Normal rate and regular rhythm. S1 and S2 normal without gallop, murmur, click, rub or other extra sounds. Abdomen:  Bowel sounds positive,abdomen soft and non-tender without masses, organomegaly or hernias noted. Msk:  No deformity or scoliosis noted of thoracic or lumbar spine.   Pulses:  R and L carotid,radial,femoral,dorsalis pedis and posterior tibial pulses are full and equal bilaterally Skin:  Intact without suspicious lesions or rashes Cervical Nodes:  No lymphadenopathy noted  Diabetes Management Exam:    Foot Exam (with socks and/or shoes not present):       Sensory-Pinprick/Light touch:          Left medial foot (L-4): normal          Left dorsal foot (L-5): normal          Left lateral foot (S-1): normal          Right medial foot (L-4): normal          Right dorsal foot (L-5): normal          Right lateral foot (S-1): normal  Sensory-Monofilament:          Left foot: normal          Right foot: normal       Inspection:          Left foot: normal          Right foot: normal       Nails:          Left foot: normal          Right foot: normal    Foot Exam by Podiatrist:       Date: 03/16/2010       Results: no diabetic findings       Done by: PCP   Impression & Recommendations:  Problem # 1:  HYPERTENSION (ICD-401.9)  His updated medication list for this problem includes:    Altace 10 Mg Caps  (Ramipril) .Marland Kitchen... 1 once daily    Amlodipine Besylate 5 Mg Tabs (Amlodipine besylate) .Marland Kitchen... Take 1 tablet by mouth once a day    Metoprolol Tartrate 100 Mg Tabs (Metoprolol tartrate) .Marland Kitchen... Take 1 tablet by mouth twice a day stable.  Will continue his present regimen  His updated medication list for this problem includes:    Altace 10 Mg Caps (Ramipril) .Marland Kitchen... 1 once daily    Amlodipine Besylate 5 Mg Tabs (Amlodipine besylate) .Marland Kitchen... Take 1 tablet by mouth once a day    Metoprolol Tartrate 100 Mg Tabs (Metoprolol tartrate) .Marland Kitchen... Take 1 tablet by mouth twice a day  Problem # 2:  DIABETES MELLITUS, TYPE II (ICD-250.00)  His updated medication list for this problem includes:    Altace 10 Mg Caps (Ramipril) .Marland Kitchen... 1 once daily    Bayer Aspirin 325 Mg Tabs (Aspirin) .Marland Kitchen... Take 1 tablet by mouth once a day    Glimepiride 4 Mg Tabs (Glimepiride) .Marland Kitchen... Take 1 tablet by mouth twice a day    Januvia 100 Mg Tabs (Sitagliptin phosphate) .Marland Kitchen... 1 once daily  Orders: Capillary Blood Glucose/CBG (13086)  diet weight loss and exercise regimen encouraged.  Will recheck a hemoglobin A1c  His updated medication list for this problem includes:    Altace 10 Mg Caps (Ramipril) .Marland Kitchen... 1 once daily    Bayer Aspirin 325 Mg Tabs (Aspirin) .Marland Kitchen... Take 1 tablet by mouth once a day    Glimepiride 4 Mg Tabs (Glimepiride) .Marland Kitchen... Take 1 tablet by mouth twice a day    Januvia 100 Mg Tabs (Sitagliptin phosphate) .Marland Kitchen... 1 once daily  Problem # 3:  CORONARY ARTERY DISEASE (ICD-414.00)  His updated medication list for this problem includes:    Altace 10 Mg Caps (Ramipril) .Marland Kitchen... 1 once daily    Amlodipine Besylate 5 Mg Tabs (Amlodipine besylate) .Marland Kitchen... Take 1 tablet by mouth once a day    Bayer Aspirin 325 Mg Tabs (Aspirin) .Marland Kitchen... Take 1 tablet by mouth once a day    Metoprolol Tartrate 100 Mg Tabs (Metoprolol tartrate) .Marland Kitchen... Take 1 tablet by mouth twice a day stable; will continue present regimen    His updated medication  list for this problem includes:    Altace 10 Mg Caps (Ramipril) .Marland Kitchen... 1 once daily    Amlodipine Besylate 5 Mg Tabs (Amlodipine besylate) .Marland Kitchen... Take 1 tablet by mouth once a day    Bayer Aspirin 325 Mg Tabs (Aspirin) .Marland Kitchen... Take 1 tablet by mouth once a day    Metoprolol Tartrate 100 Mg Tabs (Metoprolol tartrate) .Marland Kitchen... Take 1 tablet by mouth twice a day  Complete  Medication List: 1)  Altace 10 Mg Caps (Ramipril) .Marland Kitchen.. 1 once daily 2)  Amlodipine Besylate 5 Mg Tabs (Amlodipine besylate) .... Take 1 tablet by mouth once a day 3)  Bayer Aspirin 325 Mg Tabs (Aspirin) .... Take 1 tablet by mouth once a day 4)  Colchicine 0.6 Mg Tabs (Colchicine) .... Take 1 tablet by mouth daily 5)  Glimepiride 4 Mg Tabs (Glimepiride) .... Take 1 tablet by mouth twice a day 6)  Lipitor 40 Mg Tabs (Atorvastatin calcium) .Marland Kitchen.. 1 once daily 7)  Metformin Hcl 1000 Mg Tabs (metformin Hcl)  .... One tablet two  times a day 8)  Metoprolol Tartrate 100 Mg Tabs (Metoprolol tartrate) .... Take 1 tablet by mouth twice a day 9)  Freestyle Test Strips  .... Use two times a day 10)  Allopurinol 300 Mg Tabs (Allopurinol) .Marland Kitchen.. 1 once daily 11)  Freestyle Unistick Ii Lancets Misc (Lancets) 12)  Januvia 100 Mg Tabs (Sitagliptin phosphate) .Marland Kitchen.. 1 once daily Patrick)  Colcrys 0.6 Mg Tabs (Colchicine) .... Qd 14)  Azithromycin 250 Mg Tabs (Azithromycin) .... Two initially, then one daily for 4 additional days  Other Orders: Venipuncture ([address]) TLB-A1C / Hgb A1C (Glycohemoglobin) (83036-A1C) Prescription Created Electronically [phone]) Specimen Handling (09811)  Wilcox Instructions: 1)  Please schedule a follow-up appointment in 3 months. 2)  Limit your Sodium (Salt). 3)  It is important that you exercise regularly at least 20 minutes 5 times a week. If you develop chest pain, have severe difficulty breathing, or feel very tired , stop exercising immediately and seek medical attention. 4)  You need to lose weight. Consider a lower  calorie diet and regular exercise.  5)  Check your blood sugars regularly. If your readings are usually above : or below 70 you should contact our office. 6)  It is important that your Diabetic A1c level is checked every 3 months. 7)  See your eye doctor yearly to check for diabetic eye damage. Prescriptions: JANUVIA 100 MG TABS (SITAGLIPTIN PHOSPHATE) 1 once daily  #90 x 6   Entered and Authorized by:   Gordy Savers  MD   Signed by:   Gordy Savers  MD on 03/16/2010   Method used:   Electronically to        [facility] [phone]* (retail)       [address]       [place], [place]  [address]       Ph: [phone] or [phone]       Fax: [phone]   RxID:   [id number] ALLOPURINOL 300 MG  TABS (ALLOPURINOL) 1 once daily  #100 x 6   Entered and Authorized by:   Gordy Savers  MD   Signed by:   Gordy Savers  MD on 03/16/2010   Method used:   Electronically to        [facility] [phone]* (retail)       [address]       [place], [place]  [address]       Ph: [phone] or [phone]       Fax: [phone]   RxID:   [id number] METOPROLOL TARTRATE 100 MG TABS (METOPROLOL TARTRATE) Take 1 tablet by mouth twice a day  #180 x 5   Entered and Authorized by:   Gordy Savers  MD  Signed by:   Gordy Savers  MD on 03/16/2010   Method used:   Electronically to        [facility] [phone]* (retail)       [address]       [place], [place]  [address]       Ph: [phone] or [phone]       Fax: [phone]   RxID:   [id number] LIPITOR 40 MG TABS (ATORVASTATIN CALCIUM) 1 once daily  #90 x 5   Entered and Authorized by:   Gordy Savers  MD   Signed by:   Gordy Savers  MD on 03/16/2010   Method used:   Electronically to        [facility] [phone]* (retail)       [address]        [place], [place]  [address]       Ph: [phone] or [phone]       Fax: [phone]   RxID:   [id number] GLIMEPIRIDE 4 MG TABS (GLIMEPIRIDE) Take 1 tablet by mouth twice a day  #90 x 6   Entered and Authorized by:   Gordy Savers  MD   Signed by:   Gordy Savers  MD on 03/16/2010   Method used:   Electronically to        [facility] [phone]* (retail)       [address]       [place], [place]  [address]       Ph: [phone] or [phone]       Fax: [phone]   RxID:   [id number] ALTACE 10 MG CAPS (RAMIPRIL) 1 once daily  #90 x 5   Entered and Authorized by:   Gordy Savers  MD   Signed by:   Gordy Savers  MD on 03/16/2010   Method used:   Electronically to        [facility] [phone]* (retail)       [address]       [place], [place]  [address]       Ph: [phone] or [phone]       Fax: [phone]   RxID:   [id number]

## 2010-08-10 NOTE — Assessment & Plan Note (Signed)
Summary: M3A/FUP/RCD   Vital Signs:  Patient Profile:   65 Years Old Male Height:     72 inches (182.88 cm) Weight:      198 pounds Temp:     97.5 degrees F oral BP sitting:   160 / 80  (left arm)  Vitals Entered By: Raechel Ache, RN (May 21, 2007 8:37 AM)                 Chief Complaint:  ROV. Hands hurt and sinus congestion. FBS 103-114 @home ..  History of Present Illness: a 65 year old male follow diabetes, hypertension, dyslipidemia, doing well.  Lasix is some hand stiffness, but awakens in the morning, but resolves quickly.  He states he is maintained nice glycemic control at home only complaint is mild sinus symptoms  Current Allergies: No known allergies      Review of Systems       denies except as mentioned in the history of present illness   Physical Exam  General:     Well-developed,well-nourished,in no acute distress; alert,appropriate and cooperative throughout examination Head:     Normocephalic and atraumatic without obvious abnormalities. No apparent alopecia or balding. Eyes:     No corneal or conjunctival inflammation noted. EOMI. Perrla. Funduscopic exam benign, without hemorrhages, exudates or papilledema. Vision grossly normal. Mouth:     Oral mucosa and oropharynx without lesions or exudates.  Teeth in good repair. Neck:     No deformities, masses, or tenderness noted. Chest Wall:     No deformities, masses, tenderness or gynecomastia noted. Lungs:     Normal respiratory effort, chest expands symmetrically. Lungs are clear to auscultation, no crackles or wheezes. Heart:     grade 2/6 systolic murmur sternotomy scar Abdomen:     Bowel sounds positive,abdomen soft and non-tender without masses, organomegaly or hernias noted. Msk:     No deformity or scoliosis noted of thoracic or lumbar spine.   Pulses:     R and L carotid,radial,femoral,dorsalis pedis and posterior tibial pulses are full and equal bilaterally Extremities:     No  clubbing, cyanosis, edema, or deformity noted with normal full range of motion of all joints.      Impression & Recommendations:  Problem # 1:  MYOCARDIAL INFARCTION, HX OF (ICD-412)  His updated medication list for this problem includes:    Altace 10 Mg Caps (Ramipril) .Marland Kitchen... 1 once daily    Amlodipine Besylate 5 Mg Tabs (Amlodipine besylate) .Marland Kitchen... Take 1 tablet by mouth once a day    Bayer Aspirin 325 Mg Tabs (Aspirin) .Marland Kitchen... Take 1 tablet by mouth once a day    Metoprolol Tartrate 100 Mg Tabs (Metoprolol tartrate) .Marland Kitchen... Take 1 tablet by mouth twice a day   Problem # 2:  HYPERTENSION (ICD-401.9)  His updated medication list for this problem includes:    Altace 10 Mg Caps (Ramipril) .Marland Kitchen... 1 once daily    Amlodipine Besylate 5 Mg Tabs (Amlodipine besylate) .Marland Kitchen... Take 1 tablet by mouth once a day    Metoprolol Tartrate 100 Mg Tabs (Metoprolol tartrate) .Marland Kitchen... Take 1 tablet by mouth twice a day   Problem # 3:  HYPERLIPIDEMIA (ICD-272.4) lot [id number], EXP 30 jun 09, sanofi pasteur left deltoid IM, 0.5 cc.  His updated medication list for this problem includes:    Lipitor 40 Mg Tabs (Atorvastatin calcium) .Marland Kitchen... 1 once daily   Problem # 4:  DIABETES MELLITUS, TYPE II (ICD-250.00)  His updated medication list for this problem includes:  Altace 10 Mg Caps (Ramipril) .Marland Kitchen... 1 once daily    Bayer Aspirin 325 Mg Tabs (Aspirin) .Marland Kitchen... Take 1 tablet by mouth once a day    Glimepiride 4 Mg Tabs (Glimepiride) .Marland Kitchen... Take 1 tablet by mouth twice a day    Metformin Hcl 500 Mg Tabs (Metformin hcl) .Marland Kitchen... 1 two times a day  Orders: Venipuncture (47425) TLB-A1C / Hgb A1C (Glycohemoglobin) (83036-A1C)   Complete Medication List: 1)  Altace 10 Mg Caps (Ramipril) .Marland Kitchen.. 1 once daily 2)  Amlodipine Besylate 5 Mg Tabs (Amlodipine besylate) .... Take 1 tablet by mouth once a day 3)  Bayer Aspirin 325 Mg Tabs (Aspirin) .... Take 1 tablet by mouth once a day 4)  Colchicine 0.6 Mg Tabs (Colchicine) ....  Take 1 tablet by mouth twice a day 5)  Glimepiride 4 Mg Tabs (Glimepiride) .... Take 1 tablet by mouth twice a day 6)  Lipitor 40 Mg Tabs (Atorvastatin calcium) .Marland Kitchen.. 1 once daily 7)  Metformin Hcl 500 Mg Tabs (Metformin hcl) .Marland Kitchen.. 1 two times a day 8)  Metoprolol Tartrate 100 Mg Tabs (Metoprolol tartrate) .... Take 1 tablet by mouth twice a day 9)  Freestyle Test Strips  .... Use two times a day 10)  Allopurinol 300 Mg Tabs (Allopurinol) .Marland Kitchen.. 1 once daily  Other Orders: Influenza Vaccine NON MCR (95638)   Patient Instructions: 1)  Please schedule a follow-up appointment in 3 months. for annual exam 2)  Limit your Sodium (Salt) to less than 2 grams a day(slightly less than 1/2 a teaspoon) to prevent fluid retention, swelling, or worsening of symptoms. 3)  It is important that you exercise regularly at least 20 minutes 5 times a week. If you develop chest pain, have severe difficulty breathing, or feel very tired , stop exercising immediately and seek medical attention. 4)  Check your blood sugars regularly. If your readings are usually above : or below 70 you should contact our office. 5)  It is important that your Diabetic A1c level is checked every 3 months.    ]  Influenza Vaccine    Vaccine Type: Fluvax Non-MCR    Given by: Raechel Ache, RN  Flu Vaccine Consent Questions    Do you have a history of severe allergic reactions to this vaccine? no    Any prior history of allergic reactions to egg and/or gelatin? no    Do you have a sensitivity to the preservative Thimersol? no    Do you have a past history of Guillan-Barre Syndrome? no    Do you currently have an acute febrile illness? no    Have you ever had a severe reaction to latex? no    Vaccine information given and explained to patient? yes

## 2010-08-10 NOTE — Miscellaneous (Signed)
Summary: [facility] Previsit/prep  Clinical Lists Changes  Medications: Added new medication of MOVIPREP 100 GM  SOLR (PEG-KCL-NACL-NASULF-NA ASC-C) As per prep instructions. - Signed Rx of MOVIPREP 100 GM  SOLR (PEG-KCL-NACL-NASULF-NA ASC-C) As per prep instructions.;  #1 x 0;  Signed;  Entered by: Wyona Almas RN;  Authorized by: Mardella Layman MD [facility];  Method used: Electronically to [facility] #[id number]*, [address], [place], [place], [place]  [address], Ph: [phone] or [phone], Fax: [phone] Observations: Added new observation of NKA: T (10/01/2008 10:52)    Prescriptions: MOVIPREP 100 GM  SOLR (PEG-KCL-NACL-NASULF-NA ASC-C) As per prep instructions.  #1 x 0   Entered by:   Wyona Almas RN   Authorized by:   Mardella Layman MD [facility]   Signed by:   Wyona Almas RN on 10/01/2008   Method used:   Electronically to        [facility] [phone]* (retail)       [address]       [place], [place]  [address]       Ph: [phone] or [phone]       Fax: [phone]   RxID:   [id number]

## 2010-08-10 NOTE — Miscellaneous (Signed)
  Clinical Lists Changes  Medications: Added new medication of JANUVIA 100 MG TABS (SITAGLIPTIN PHOSPHATE) 1 once daily - Signed Rx of JANUVIA 100 MG TABS (SITAGLIPTIN PHOSPHATE) 1 once daily;  #90 x 1;  Signed;  Entered by: Raechel Ache, RN;  Authorized by: Gordy Savers  MD;  Method used: Electronically to [facility] [phone]*, [address] [place], [place], [place]  [address], Ph: [phone] or [phone], Fax: [phone]    Prescriptions: JANUVIA 100 MG TABS (SITAGLIPTIN PHOSPHATE) 1 once daily  #90 x 1   Entered by:   Raechel Ache, RN   Authorized by:   Gordy Savers  MD   Signed by:   Raechel Ache, RN on 12/23/2008   Method used:   Electronically to        [facility] [phone]* (retail)       [address]       [place], [place]  [address]       Ph: [phone] or [phone]       Fax: [phone]   RxID:   [id number]

## 2010-09-07 ENCOUNTER — Other Ambulatory Visit (INDEPENDENT_AMBULATORY_CARE_PROVIDER_SITE_OTHER): Payer: 59 | Admitting: Internal Medicine

## 2010-09-07 DIAGNOSIS — Z0000 Encounter for general adult medical examination without abnormal findings: Secondary | ICD-10-CM

## 2010-09-07 LAB — CBC WITH DIFFERENTIAL/PLATELET
Basophils Absolute: 0 10*3/uL (ref 0.0–0.1)
Basophils Relative: 0.3 % (ref 0.0–3.0)
Eosinophils Absolute: 0.4 10*3/uL (ref 0.0–0.7)
Eosinophils Relative: 3.4 % (ref 0.0–5.0)
HCT: 41.8 % (ref 39.0–52.0)
Hemoglobin: 14.5 g/dL (ref 13.0–17.0)
Lymphocytes Relative: 28.1 % (ref 12.0–46.0)
Lymphs Abs: 3.1 10*3/uL (ref 0.7–4.0)
MCHC: 34.6 g/dL (ref 30.0–36.0)
MCV: 88.6 fl (ref 78.0–100.0)
Monocytes Absolute: 0.7 10*3/uL (ref 0.1–1.0)
Monocytes Relative: 6.1 % (ref 3.0–12.0)
Neutro Abs: 6.8 10*3/uL (ref 1.4–7.7)
Neutrophils Relative %: 62.1 % (ref 43.0–77.0)
Platelets: 195 10*3/uL (ref 150.0–400.0)
RBC: 4.72 Mil/uL (ref 4.22–5.81)
RDW: 14.9 % — ABNORMAL HIGH (ref 11.5–14.6)
WBC: 10.9 10*3/uL — ABNORMAL HIGH (ref 4.5–10.5)

## 2010-09-07 LAB — LIPID PANEL
Cholesterol: 95 mg/dL (ref 0–200)
HDL: 27.4 mg/dL — ABNORMAL LOW (ref >39.00)
LDL Cholesterol: 52 mg/dL (ref 0–99)
Total CHOL/HDL Ratio: 3
Triglycerides: 79 mg/dL (ref 0.0–149.0)
VLDL: 15.8 mg/dL (ref 0.0–40.0)

## 2010-09-07 LAB — HEPATIC FUNCTION PANEL
ALT: 41 U/L (ref 0–53)
AST: 30 U/L (ref 0–37)
Albumin: 4.6 g/dL (ref 3.5–5.2)
Alkaline Phosphatase: 102 U/L (ref 39–117)
Bilirubin, Direct: 0.2 mg/dL (ref 0.0–0.3)
Total Bilirubin: 1.3 mg/dL — ABNORMAL HIGH (ref 0.3–1.2)
Total Protein: 7.8 g/dL (ref 6.0–8.3)

## 2010-09-07 LAB — MICROALBUMIN / CREATININE URINE RATIO
Creatinine,U: 277.2 mg/dL
Microalb Creat Ratio: 6.1 mg/g (ref 0.0–30.0)
Microalb, Ur: 16.9 mg/dL — ABNORMAL HIGH (ref 0.0–1.9)

## 2010-09-07 LAB — POCT URINALYSIS DIPSTICK
Bilirubin, UA: NEGATIVE
Leukocytes, UA: NEGATIVE
Nitrite, UA: NEGATIVE
Spec Grav, UA: 1.03
Urobilinogen, UA: 1
pH, UA: 5

## 2010-09-07 LAB — BASIC METABOLIC PANEL
BUN: 16 mg/dL (ref 6–23)
CO2: 27 mEq/L (ref 19–32)
Calcium: 9.8 mg/dL (ref 8.4–10.5)
Chloride: 100 mEq/L (ref 96–112)
Creatinine, Ser: 0.8 mg/dL (ref 0.4–1.5)
GFR: 123.03 mL/min (ref >60.00)
Glucose, Bld: 211 mg/dL — ABNORMAL HIGH (ref 70–99)
Potassium: 4.6 mEq/L (ref 3.5–5.1)
Sodium: 136 mEq/L (ref 135–145)

## 2010-09-07 LAB — PSA: PSA: 0.27 ng/mL (ref 0.10–4.00)

## 2010-09-07 LAB — TSH: TSH: 3.33 u[IU]/mL (ref 0.35–5.50)

## 2010-09-07 LAB — HEMOGLOBIN A1C: Hgb A1c MFr Bld: 7.3 % — ABNORMAL HIGH (ref 4.6–6.5)

## 2010-09-13 ENCOUNTER — Encounter: Payer: Self-pay | Admitting: Internal Medicine

## 2010-09-14 ENCOUNTER — Ambulatory Visit (INDEPENDENT_AMBULATORY_CARE_PROVIDER_SITE_OTHER): Payer: 59 | Admitting: Internal Medicine

## 2010-09-14 ENCOUNTER — Encounter: Payer: Self-pay | Admitting: Internal Medicine

## 2010-09-14 VITALS — BP 150/100 | HR 77 | Temp 98.3°F | Resp 16 | Ht 70.5 in | Wt 218.0 lb

## 2010-09-14 DIAGNOSIS — I10 Essential (primary) hypertension: Secondary | ICD-10-CM

## 2010-09-14 MED ORDER — AMLODIPINE BESYLATE 10 MG PO TABS: 10.0000 mg | ORAL_TABLET | Freq: Every day | ORAL | Status: DC

## 2010-09-14 NOTE — Patient Instructions (Signed)
Please check your blood pressure on a regular basis.  If it is consistently greater than 150/90, please make an office appointment.   Please check your hemoglobin A1c every 3 months  Limit your sodium (Salt) intake    It is important that you exercise regularly, at least 20 minutes 3 to 4 times per week.  If you develop chest pain or shortness of breath seek  medical attention.  You need to lose weight.  Consider a lower calorie diet and regular exercise.

## 2010-09-14 NOTE — Progress Notes (Signed)
  Subjective:    Patient ID: Patrick Wilcox, male    DOB: 1945/11/04, 65 y.o.   MRN: [id number]  HPI   And 65 year old patient who is seen today for a health maintenance examination. He has a history of coronary artery disease which has been stable A. Status post CABG in 2003. He did have a colonoscopy last year. He denies any exertional complaints. He does walk up to 3 miles several days a week.  He does have a history of type 2 diabetes and hemoglobin A1c has improved with some modest weight loss. Hemoglobin A1c 7.3. He remains on triple therapy.  He has hypertension and dyslipidemia which have been well controlled.    Review of Systems  Constitutional: Negative for fever, chills, activity change, appetite change and fatigue.  HENT: Negative for hearing loss, ear pain, congestion, rhinorrhea, sneezing, mouth sores, trouble swallowing, neck pain, neck stiffness, dental problem, voice change, sinus pressure and tinnitus.   Eyes: Negative for photophobia, pain, redness and visual disturbance.  Respiratory: Negative for apnea, cough, choking, chest tightness, shortness of breath and wheezing.   Cardiovascular: Negative for chest pain, palpitations and leg swelling.  Gastrointestinal: Negative for nausea, vomiting, abdominal pain, diarrhea, constipation, blood in stool, abdominal distention, anal bleeding and rectal pain.  Genitourinary: Negative for dysuria, urgency, frequency, hematuria, flank pain, decreased urine volume, discharge, penile swelling, scrotal swelling, difficulty urinating, genital sores and testicular pain.  Musculoskeletal: Negative for myalgias, back pain, joint swelling, arthralgias and gait problem.  Skin: Negative for color change, rash and wound.  Neurological: Negative for dizziness, tremors, seizures, syncope, facial asymmetry, speech difficulty, weakness, light-headedness, numbness and headaches.  Hematological: Negative for adenopathy. Does not bruise/bleed easily.    Psychiatric/Behavioral: Negative for suicidal ideas, hallucinations, behavioral problems, confusion, sleep disturbance, self-injury, dysphoric mood, decreased concentration and agitation. The patient is not nervous/anxious.        Objective:   Physical Exam  Constitutional: He appears well-developed and well-nourished.        Blood pressure 150/90  HENT:  Head: Normocephalic and atraumatic.  Right Ear: External ear normal.  Left Ear: External ear normal.  Nose: Nose normal.  Mouth/Throat: Oropharynx is clear and moist.  Eyes: Conjunctivae and EOM are normal. Pupils are equal, round, and reactive to light. No scleral icterus.  Neck: Normal range of motion. Neck supple. No JVD present. No thyromegaly present.  Cardiovascular: Regular rhythm, normal heart sounds and intact distal pulses.  Exam reveals no gallop and no friction rub.   No murmur heard. Pulmonary/Chest: Effort normal and breath sounds normal. He exhibits no tenderness.        Sternotomy scar  Abdominal: Soft. Bowel sounds are normal. He exhibits no distension and no mass. There is no tenderness.  Genitourinary: Prostate normal and penis normal.  Musculoskeletal: Normal range of motion. He exhibits no edema and no tenderness.  Lymphadenopathy:    He has no cervical adenopathy.  Neurological: He is alert. He has normal reflexes. No cranial nerve deficit. Coordination normal.  Skin: Skin is warm and dry. No rash noted.  Psychiatric: He has a normal mood and affect. His behavior is normal.          Assessment & Plan:   preventive health examination  Hypertension suboptimal control. We'll titrate amlodipine to 10 mg daily  Diabetes mellitus type 2-stable  Dyslipidemia   Will increase amlodipine to 10 mg daily. He will continue his efforts at weight loss and more exercise. We'll reassess in 3 months

## 2010-10-19 LAB — GLUCOSE, CAPILLARY
Glucose-Capillary: 136 mg/dL — ABNORMAL HIGH (ref 70–99)
Glucose-Capillary: 168 mg/dL — ABNORMAL HIGH (ref 70–99)

## 2010-10-22 ENCOUNTER — Other Ambulatory Visit: Payer: Self-pay | Admitting: Internal Medicine

## 2010-10-30 ENCOUNTER — Other Ambulatory Visit: Payer: Self-pay | Admitting: Internal Medicine

## 2010-11-26 NOTE — H&P (Signed)
NAME:  Patrick [facility], Patrick Wilcox NO.:  [id number]   MEDICAL RECORD NO.:  [id number]          PATIENT TYPE:  INP   LOCATION:                               FACILITY:  [facility]   PHYSICIAN:  Gordy Savers, M.D. LHCDATE OF BIRTH:  01/07/1946   DATE OF ADMISSION:  10/26/2004  DATE OF DISCHARGE:                                HISTORY & PHYSICAL   CHIEF COMPLAINT:  Weakness.   HISTORY OF PRESENT ILLNESS:  The patient is a 65 year old black gentleman  with a history of prior coronary artery disease, but no prior history of  diabetes.  He presented to the office complaining of increasing weakness and  marked polyuria.  He states he has had frequent urination for only 1 week.  He also describes a 35 pound weight loss over the past month.  Denies any  blurred vision.  He states that multiple family members have had diabetes in  the past.  A random blood sugar with our office Glucometer was too high to  give a number.  The patient is now admitted with new onset uncontrolled  diabetes.   PAST MEDICAL HISTORY:  He has a history of coronary artery disease, status  post CABG in October of 2004.  In March of 2005, he underwent hospital  admission for nonsustained ventricular tachycardia and had PCI performed on  an obtuse marginal branch.  He has a history of hypertension and  dyslipidemia.  In addition, he has had surgery for traumatic injury  involving his right hand.   FAMILY HISTORY:  Positive for hypertension, diabetes, and coronary artery  disease.   PHYSICAL EXAMINATION:  GENERAL:  Healthy-appearing black male who appears  slightly weak, but no acute distress.  VITAL SIGNS:  Kussmaul's respirations were absent, blood pressure 90/60,  pulse 90, respirations were normal.  He is afebrile.  HEENT:  Normal fundi.  Ears, nose, and throat clear.  Mucosal membranes did  not appear to be too dry.  NECK:  No neck vein distention or bruits.  CHEST:  Clear.  HEART:  Normal S1 and S2,  well-healed sternotomy scar.  ABDOMEN:  Benign, no organomegaly.  EXTREMITIES:  No edema.  Peripheral pulses intact.   IMPRESSION:  New onset uncontrolled diabetes.   DISPOSITION:  The patient will be admitted to the hospital and supported  with IV fluids.  She will be placed on an insulin infusion.  Electrolytes  and blood sugars monitored closely.  His antihypertensive medications will  be held until his blood pressure is more stable.  Diabetic inpatient  teaching will be instituted.       ___________________________________________  Gordy Savers, M.D. [facility]   PFK/MEDQ  D:  10/26/2004  T:  10/26/2004  Job:  [id number]

## 2010-11-26 NOTE — Discharge Summary (Signed)
NAME:  Patrick [facility], Patrick Wilcox               ACCOUNT NO.:  [id number]   MEDICAL RECORD NO.:  [id number]          PATIENT TYPE:  INP   LOCATION:  4730                         FACILITY:  [facility]   PHYSICIAN:  Rene Paci, M.D. DATE OF BIRTH:  1946-04-15   DATE OF ADMISSION:  10/26/2004  DATE OF DISCHARGE:  10/28/2004                                 DISCHARGE SUMMARY   DISCHARGE DIAGNOSES:  1.  New diagnosis of diabetes.  2.  Hyperglycemia.  3.  HONK.  4.  Hypokalemia.   BRIEF ADMISSION HISTORY:  Mr. Cawood is a 65 year old African-American male  who presented with complaints of polyuria and polydipsia, and was found to  have uncontrolled diabetes.   PAST MEDICAL HISTORY:  1.  Coronary artery disease, status post CABG in October 2004, status post      non Q wave MI in March 2005 with nonsustained V-tach.  He underwent PCI      at that time.  2.  Dyslipidemia.  3.  Hypertension.  4.  History of traumatic right hand injury.   HOSPITAL COURSE:  Problem 1. Adult onset diabetes mellitus.  This is a new  diagnosis.  The patient was admitted and started on fluids with an insulin  drip.  The patient's blood sugars have normalized and the insulin drip was  discontinued.  We have started the patient on oral hyperglycemic agents.  The patient's blood sugars are improving.  The patient has begun diabetic  education and instructions on using a Glucometer.   Problem 2. HONK secondary to hyperglycemia.  This corrected with IV fluids.   Problem 3. Hypokalemia secondary to his hyperosmolar state.  This has also  been corrected.   LABORATORY DATA AT DISCHARGE:  Sodium 132, potassium 3.6, BUN 8, creatinine  0.7.  Hemoglobin A1c is still pending.   DISCHARGE MEDICATIONS:  1.  Altace 2 mg daily.  2.  Metoprolol 25 mg b.i.d.  3.  Aspirin 325 mg daily.  4.  Lipitor 40 mg daily.  5.  HCTZ 12.5 mg to hold for now.  6.  Amaryl 4 mg b.i.d.  7.  Glucophage 500 mg b.i.d.   Followup with Dr. Amador Cunas  Monday, November 01, 2004 at 11:30 a.m.      LC/MEDQ  D:  10/28/2004  T:  10/28/2004  Job:  [id number]

## 2010-11-26 NOTE — Discharge Summary (Signed)
NAME:  Patrick [facility], Patrick Wilcox NO.:  [id number]   MEDICAL RECORD NO.:  [id number]                   PATIENT TYPE:  OIB   LOCATION:  6533                                 FACILITY:  [facility]   PHYSICIAN:  Carole Binning, M.D. [facility]         DATE OF BIRTH:  [birth year]   DATE OF ADMISSION:  09/12/2003  DATE OF DISCHARGE:  09/13/2003                                 DISCHARGE SUMMARY   DISCHARGE DIAGNOSES:  1. Recent nonsustained ventricular tachycardia with exercise and recent     exertional chest discomfort.  2. Left heart catheterization, September 12, 2003.  The patient has a 95%     stenosis in the first obtuse marginal just distal to the anastomosis with     reverse saphenous vein graft.  A bare metal stent was placed through this     lesion by way of the reverse saphenous vein graft.  This was reduced from     95% to 0% with bare metal stent.  Ejection fraction greater than 60%.  No     mitral regurgitation.  The left internal mammary artery to the left     anterior descending is patent.  And reverse saphenous vein graft to the     ramus intermediate is patent.  A reverse saphenous vein graft to the     distal right coronary artery is also patent.   SECONDARY DIAGNOSES:  1. History of non-Q-wave myocardial infarction with finding of severe three-     vessel coronary artery disease, status post coronary artery bypass graft     surgery, October 2004.  2. Hypertension.  3. Dyslipidemia with LDL of 103 this admission despite Pravachol therapy.     His statin will be changed to Lipitor.   PROCEDURE:  September 12, 2003, left heart catheterization with PCI.  The study  shows that the grafts to the LAD and to the ramus intermediate as well as to  the distal right coronary artery are all patent.  He has a sequential  saphenous vein graft to the first obtuse marginal and then to the second  obtuse marginal, and he has a reverse saphenous vein graft to the posterior  descending  coronary artery.  The patient has a 95% stenosis in the first  obtuse marginal just distal to the anastomosis with reverse saphenous vein  graft.  A bare metal stent was placed through this lesion by way of the  reverse saphenous vein graft.  [facility] other grafts are patent,  although there is a long 50% stenosis in the reverse saphenous vein graft to  the distal right coronary artery, proximally.   BRIEF HISTORY:  [name] is a 65 year old male, who underwent  coronary artery bypass graft surgery in October 2004.  The patient has been  involved in cardiac rehabilitation and during exercise, he has had episodes  of nonsustained ventricular tachycardia associated with palpitations and  chest  discomfort.  The patient was referred for an exercise Cardiolite study  which was done August 27, 2003.  The patient was hypertensive during the  exercise, and his beta blocker had been held before study.  He did have  chest discomfort and ST segment depression inferior and anterolateral leads  as well as supraventricular tachycardia and frequent ventricular ectopy.  Perfusion images showed a mild reverse of defect in the anterior wall from  the base to the mid ventricle as well as mild reversible defect in the  inferior wall from the base to the apex.  The patient had been seen February  24 to discuss the results of his exercise study.  He has not had any  recurrent chest tightness since his exercise test.  He does have occasional  lightheadedness, however, when he exercises, and he does have occasional  left shoulder discomfort, but this is not exertional.  Plan will be to  proceed with cardiac catheterization since the stress test did show evidence  of mild ischemia in the inferior and anterior walls.  Cardiac  catheterization will be carried out March 4 on an outpatient basis.   HOSPITAL COURSE:  [facility] presented for elective left heart  catheterization September 12, 2003.  He  underwent bare metal stenting to a  stenosis in the first obtuse marginal just distal to the anastomosis with  reverse saphenous vein graft.  The stenosis would reduce from 95% to 0 with  TIMI 3 flow.  Ejection fraction greater than 60%.  No mitral regurgitation.  In the postprocedure, the patient did very well.  He had no complications  from the catheterization.  He is achieving 98% oxygen saturation room air,  and his potassium was a little low at 3.3 postprocedure day #1.  This has  been replenished.  The patient has not had any ectopy in the postprocedure  period.  No chest discomfort.  He goes home with the following medications.   DISCHARGE MEDICATIONS:  1. His Pravachol has been changed to Lipitor 40 mg daily q.h.s.  2. Enteric-coated aspirin 325 mg daily.  3. Plavix 75 mg daily x 6 months.  4. Altace 10 mg daily.  5. Metoprolol 100 mg b.i.d.  6. Nitroglycerin 0.4 mg 1 tab under the tongue q.10m. x 3 doses.  7. For pain, he will have Tylenol 325 mg 1-2 tabs q.4-6h. p.r.n.   DISCHARGE INSTRUCTIONS:  1. He is asked not to drive until Monday, March 7.  2. He is to avoid heavy lifting or strenuous activity for 1 week.  3. Discharge diet:  Low sodium, low cholesterol diet.  4. He may shower.  5. He is to call [phone] if he experiences pain or swelling at the     catheterization site.  6. He will have an office visit follow-up with Dr. Gerri Spore in 2 weeks.     The office will call with that appointment.   LABORATORY STUDIES:  Postprocedure day #1, serum electrolyte on March 5:  Sodium 135, potassium 3.3, chloride 100, carbonate 29, BUN 7, and creatinine  0.8. glucose 117.  Complete blood count:  White cells 11.4, hemoglobin 12.9,  hematocrit 37, platelets 236.  CK-MBs on the evening of the procedure:  CK  was 92.  CK-MB was 1.2.  This hospitalization, alkaline phosphatase was 78; SGOT was 17, SGPT 15.  Cholesterol 160, triglycerides 72, HDL cholesterol 43  and LDL cholesterol  103.   Once again, the patient will be changed from Pravachol to  Lipitor.      Maple Mirza, P.A.                    Carole Binning, M.D. [facility]    GM/MEDQ  D:  09/13/2003  T:  09/14/2003  Job:  [id number]   cc:   Carole Binning, M.D. [facility]   Gordy Savers, M.D. [facility]

## 2010-11-26 NOTE — Discharge Summary (Signed)
NAME:  Patrick [facility], Patrick Wilcox NO.:  [id number]   MEDICAL RECORD NO.:  [id number]                   PATIENT TYPE:  INP   LOCATION:  2041                                 FACILITY:  [facility]   PHYSICIAN:  Salvatore Decent. Dorris Fetch, M.D.         DATE OF BIRTH:  1946-04-03   DATE OF ADMISSION:  04/26/2003  DATE OF DISCHARGE:  05/03/2003                                 DISCHARGE SUMMARY   HISTORY OF PRESENT ILLNESS:  Mr. Lyerly is a 65 year old black male with no  known cardiac history.  Approximately one and one-half months ago the  patient developed symptoms of exertional dyspnea.  He would note that with a  moderate degree of exertion he would become short of breath, stop and rest a  few minutes, and his shortness of breath would resolve.  He also had a long  history of symptoms of a heartburn type chest discomfort which he has  attributed to indigestion.  This pain was most noticeable after meals and it  occasionally awoke him at night.  His chest discomfort has been relieved  with antacids.  Over the past several days prior to admission the patient  noted increasing frequency of this chest discomfort and dyspnea.  He also  noted some left shoulder and arm tingling.  On the date of admission he  [facility] where an EKG showed a  significant ST segment depression in the inferior and anterolateral leads.  He was treated with nitroglycerin, oxygen, aspirin, and transported  emergently to [facility] where he was admitted for further  evaluation and treatment.   PAST MEDICAL HISTORY:  1. He does have a history of hypertension, but stopped medications on his     own.  He has not seen a physician for a long time.  2. He has a questionable skin condition, possibly eczema.   MEDICATIONS ON ADMISSION:  None.   ALLERGIES:  No known drug allergies.   SOCIAL HISTORY:  Please see the history and physical done at the time of  admission.   FAMILY HISTORY:  Please see the history and physical done at the time of  admission.   REVIEW OF SYSTEMS:  Please see the history and physical done at the time of  admission.   PHYSICAL EXAMINATION:  Please see the history and physical done at the time  of admission.   HOSPITAL COURSE:  The patient was admitted with the plan to start heparin as  well as nitroglycerin and beta blockers.  Additionally, Integrilin was  started.  Serial cardiac enzymes and EKGs were also to be obtained and the  patient was felt to require cardiac catheterization.   On April 28, 2003 the patient was taken to the cardiac catheterization  laboratory by Carole Binning, M.D. [facility] where he was found to have left  ventricular systolic function in the lower range of normal.  Initially, he  had severe three vessel coronary disease including critical left main  disease involving the trifurcation, left anterior descending, left  circumflex, and ramus intermedius.  In addition, there was a subtotal  occlusion in the distal right coronary artery as well as a large obtuse  marginal branch.  The patient had an intraaortic balloon pump placed for  further hemodynamic support and the patient was recommended urgent surgical  revascularization.  The patient was seen in consultation by Evelene Croon,  M.D. who recommended proceeding with the procedure and all risks, benefits,  etc. were explained to the patient.  Salvatore Decent Dorris Fetch, M.D. was the  surgeon most available to do the procedure and on April 29, 2003 the  patient underwent the following procedure:  Coronary artery bypass grafting  x5.  The following grafts were placed:  Left internal mammary artery to the  left anterior descending artery, saphenous vein graft to the ramus  intermedius, sequential saphenous vein graft to obtuse marginal 1 and 2, and  finally saphenous vein graft to posterior descending.  The patient tolerated  the procedure  well.  Was taken to the surgical intensive care unit in stable  condition.  Postoperative hospital course patient has done well.  He has  maintained stable hemodynamics.  His intensive care unit stay was  unremarkable.  All routine lines, monitors, and drainage devices were  discontinued in a standard fashion.  He does have a mild postoperative  anemia, but has not required transfusion.  Most recent hemoglobin and  hematocrit dated May 01, 2003 are 9.8 and 28.4, respectively.  Electrolytes, BUN, and creatinine have remained stable.  Incisions are  healing well without signs of infection.  He has tolerated a gentle  diuresis.  Oxygen has been weaned and he maintains adequate saturations on  room air.  Routine cardiac rehabilitation phase I modalities have been  undertaken and he has tolerated well, commensurate for level of  postoperative convalescence.  The patient has some moderate elevation in his  blood pressure and medications have been added for this including Altace as  well as a beta blocker.  The patient has also been started on Pravachol for  lipid therapy.  Tentatively, he is felt to be stable for discharge in the  morning of May 03, 2003 pending morning round reevaluation.   DISCHARGE MEDICATIONS:  1. Pravachol 20 mg daily.  2. Altace 2.5 mg daily.  3. Lopressor 50 mg b.i.d.  4. Aspirin 325 mg daily.  5. For pain, Tylox one or two q.4-6h. p.r.n. for pain.   DISCHARGE INSTRUCTIONS:  The patient will receive written instructions  regarding medications, activity, diet, wound care, and follow-up.  Follow-up  will include Carole Binning, M.D. [facility] November 11 at 11:30 a.m. with a  chest x-ray at that time.  Additional follow-up will include Salvatore Decent.  Dorris Fetch, M.D. Friday, November 12 at 12:45.   CONDITION ON DISCHARGE:  Stable and improved.   FINAL DIAGNOSES: 1. Severe three vessel coronary artery disease.  The patient did rule in for     myocardial infarction  by CK and troponin enzymes.  2. Hypertension.  3. Hyperlipidemia.  4. Postoperative anemia.  5. History of right hand surgery due to trauma.  6. Positive family history of coronary artery disease.      Rowe Clack, P.A.-C.                    Salvatore Decent Dorris Fetch, M.D.  WEG/MEDQ  D:  05/02/2003  T:  05/02/2003  Job:  [id number]   cc:   Carole Binning, M.D. [facility]

## 2010-11-26 NOTE — Cardiovascular Report (Signed)
NAME:  Patrick [facility], Patrick Wilcox NO.:  [id number]   MEDICAL RECORD NO.:  [id number]                   PATIENT TYPE:  INP   LOCATION:  2308                                 FACILITY:  [facility]   PHYSICIAN:  Patrick [facility], M.D. [facility]         DATE OF BIRTH:  1945-12-04   DATE OF PROCEDURE:  04/28/2003  DATE OF DISCHARGE:                              CARDIAC CATHETERIZATION   PROCEDURE PERFORMED:  1. Left heart catheterization with coronary angiography, left     ventriculography, and abdominal aortography.  2. Intraaortic balloon pump insertion.   INDICATIONS:  Mr. Age is a 65 year old male who presented to the [facility]  two days ago with chest pain and ST segment depression.  His chest pain  resolved and ST segments normalized after arrival.  He did rule in for a non  Q-wave myocardial infarction.   PROCEDURAL NOTE:  A 6-French sheath was placed in the right femoral artery.  Coronary angiography was performed with 6-French JL4 and JR4 catheters.  Left ventriculography was performed with an angled pigtail catheter.  Contrast was Omnipaque.  After completion of the diagnostic catheterization  an 8-French 40 mL intraaortic balloon pump was placed via the right femoral  artery.  There were no complications.   RESULTS:  HEMODYNAMICS:  Left ventricular pressure 168/28.  Aortic pressure 160/88.  There is no  aortic valve gradient.   LEFT VENTRICULOGRAM:  There is mild hypokinesis of the anteroapical wall.  Ejection fraction  estimated at 55%.  There is no mitral regurgitation.   ABDOMINAL AORTOGRAM:  Normal renal arteries, abdominal aorta, and iliac arteries.   CORONARY ARTERIOGRAPHY:  (Right dominant)  Left main has a distal 95% stenosis.   Left anterior descending artery has an ostial 95% stenosis.  There is a  tubular 70% stenosis in the mid LAD.  The LAD gives rise to a normal sized  first diagonal and small second diagonal.  The first diagonal has a  50%  stenosis at its ostium.   Left circumflex has an ostial 95% stenosis.  The circumflex gives rise to a  large branching ramus intermedius.  The ramus intermedius also has a 95%  stenosis at its ostium.  The ramus intermedius has 30% stenosis involving  the bifurcation extending into both branches.  Circumflex then gives rise to  a small OM 1, large OM 2, and a normal sized OM 3.  OM 2 is 100% occluded at  its origin filling via collaterals as well as possibly some antegrade flow.  The OM 3 has a 30% stenosis proximally.   Right coronary artery has an 80% stenosis at its origin.  In the mid vessel  there is an 80% stenosis and the distal vessel has a 99% stenosis with TIMI  2 flow beyond this.  The distal right coronary artery gives rise to a normal  sized posterior descending artery, a normal sized posterolateral branch.  IMPRESSION:  1. Left ventricular systolic function in the lower range of normal.  2. Severe three vessel coronary artery disease including critical left main     disease involving the trifurcation left anterior descending artery, left     circumflex, and ramus intermedius.  In addition, there is a subtotal     occlusion in the distal right coronary artery as well as a large obtuse     marginal branch.   PLAN:  An intraaortic balloon pump has been placed for hemodynamic support.  At this point the patient is pain-free and hemodynamically stable.  [facility], M.D. from cardiovascular surgery has been consulted to evaluate the  patient for urgent coronary artery bypass grafting.                                                [facility], M.D. [facility]    Patrick [facility]  D:  04/28/2003  T:  04/29/2003  Job:  [id number]   cc:   [facility]

## 2010-11-26 NOTE — Assessment & Plan Note (Signed)
[facility] OFFICE NOTE   Patrick [facility], Patrick Wilcox                        MRN:          [id number]  DATE:05/30/2006                            DOB:          06/22/1946    A 65 year old black gentleman seen today for a wellness exam.  He has a  history of coronary artery disease.  He presented with acute MI in  October 2004 followed by CABG.  In June 2005 he underwent PTCA with  stenting of the first obtuse marginal branch.  He was hospitalized in  April 2006 for new-onset uncontrolled diabetes.  He has hypertension and  dyslipidemia as well.  He has had hand surgery for a traumatic injury  involving the right hand.  Otherwise, his past medical history is  unremarkable.   FAMILY HISTORY:  Both parents died of coronary artery disease.  Mother  had diabetes.  He is one of 12 siblings, strongly positive for diabetes.   REVIEW OF SYSTEM EXAMINATION:  Negative.  Only complaint is left foot  pain that is largely resolved.  It sounds like he has had a screening  sigmoidoscopy but not in greater than 5 years.  No recent colonoscopies.   SOCIAL HISTORY:  He is now working part-time with a [employer] consultant,  doing considerable walking.   EXAMINATION:  VITAL SIGNS:  Weight 205, blood pressure is 160/80 (no  medications today due to fasting state).  HEAD AND NECK:  Revealed normal fundi.  Ear, nose and throat clear.  Poor dental hygiene noted with numerous carious teeth.  NECK:  No bruits or adenopathy.  CHEST:  Clear.  CARDIOVASCULAR:  Well-healed sternotomy scar.  No murmurs or ectopics.  ABDOMEN:  Benign, no organomegaly.  GENITOURINARY:  External genitalia normal.  RECTAL:  Prostate small and benign.  Stool heme negative.  EXTREMITIES:  Negative.  The left dorsalis pedis pulse was diminished.  The peripheral pulses were intact.  NEUROLOGIC:  Negative, intact to monofilament testing.   IMPRESSION:  1. Coronary artery  disease, history of non-Q-wave myocardial      infarction status post coronary artery bypass grafting for severe      three-vessel coronary artery disease.  2. Hypertension.  3. Dyslipidemia.  4. Type 2 diabetes.   DISPOSITION:  Medical regimen unchanged.  Laboratory update will be  reviewed with special attention to his lipid profile.  A screening  colonoscopy will be obtained.  Weight loss to the range of 190 discussed  and encouraged.     Gordy Savers, MD  Electronically Signed    PFK/MedQ  DD: 05/30/2006  DT: 05/30/2006  Job #: [id number]

## 2010-11-26 NOTE — Op Note (Signed)
NAME:  Patrick Wilcox, Patrick Wilcox                           ACCOUNT NO.:  [id number]   MEDICAL RECORD NO.:  [id number]                   PATIENT TYPE:  INP   LOCATION:  2308                                 FACILITY:  [facility]   PHYSICIAN:  Salvatore Decent. Dorris Fetch, M.D.         DATE OF BIRTH:  Nov 23, 1945   DATE OF PROCEDURE:  04/29/2003  DATE OF DISCHARGE:                                 OPERATIVE REPORT   PREOPERATIVE DIAGNOSIS:  Severe left main and three-vessel coronary artery  disease, status post myocardial infarction.   POSTOPERATIVE DIAGNOSIS:  Severe left main and three-vessel coronary artery  disease, status post myocardial infarction.   PROCEDURE:  Median sternotomy, extracorporeal circulation, coronary artery  bypass grafting x5 (left internal mammary artery to the left anterior  descending, saphenous vein graft to ramus intermedius, sequential saphenous  vein graft to obtuse marginal 1 and 2, saphenous vein graft to posterior  descending), endoscopic vein harvest of left thigh.   SURGEON:  Salvatore Decent. Dorris Fetch, M.D.   ASSISTANT:  Evelene Croon, M.D.   SECOND ASSISTANT:  Coral Ceo, P.A.   ANESTHESIA:  General.   FINDINGS:  Fair-quality targets except posterior descending, poor quality;  saphenous vein of fair quality; mammary artery of good quality.   CLINICAL NOTE:  Patrick Wilcox is a 65 year old gentleman who presented on  April 26, 2003 with an acute MI and stabilized clinically.  On the  afternoon of April 28, 2003, he was taken to the cardiac catheterization  laboratory electively, where he was found to have critical left main and  severe three-vessel coronary disease.  The intra-aortic balloon pump was  placed and the patient remained stable and did not have any further pain.  He was seen in consultation by Dr. Evelene Croon.  The indications, risks,  benefits and alternative treatments were discussed in detail with the  patient.  He understood and accepted the risks and  agreed to proceed with  coronary artery bypass grafting.  I met with the patient the morning of  surgery.  He was still in agreement to proceed with the operation as  planned.   OPERATIVE NOTE:  Patrick Wilcox was brought to the preop holding area on April 29, 2003.  An intra-aortic balloon pump was placed in the right groin and  was functioning without difficulty.  The patient was taken to the operating  room, anesthetized and intubated.  A Swan-Ganz catheter and arterial blood  pressure monitoring catheter had been placed prior to intubation.  Intravenous antibiotics were administered.  The chest, abdomen and the legs  were prepped and draped in usual fashion.   A median sternotomy was performed and the left internal mammary artery was  harvested using a standard technique.  The patient was fully heparinized  prior to dividing the distal end of the mammary artery.  There was excellent  flow through the cut end of the vessel.   Simultaneously, while harvesting  the mammary artery, an incision was made in  the medial aspect of the left leg at the level of the knee.  The greater  saphenous vein was identified and was harvested endoscopically from the left  thigh.  An additional segment was harvested using open technique from the  upper left calf.  The vein was of fair quality, relatively small in size for  the patient's size, however, it was without significant sclerotic areas or  varicosities.   The pericardium was opened.  The ascending aorta was inspected and palpated.  There was no palpable atherosclerotic disease.  The aorta was cannulated via  concentric 2-0 Ethibond pledgeted pursestring sutures.  A dual-stage venous  cannula was placed via a pursestring suture in the right atrial appendage.  Cardiopulmonary bypass was instituted and the patient was cooled to 32  degrees Celsius.  The coronary arteries were inspected and the anastomotic  sites were chosen.  The conduits were  inspected and cut to length.  A foam  pad was placed in the pericardium to protect the left phrenic nerve.  A  temperature probe was placed in the myocardial septum and a cardioplegia  cannula was placed in the ascending aorta and a retrograde cardioplegic  cannula was placed via a pursestring suture in the right atrium and directed  into the coronary sinus.   The aorta was cross-clamped.  The left ventricle was emptied via the aortic  root vent.  Cardiac arrest was achieved with a combination of cold antegrade  and retrograde blood cardioplegia and topical iced saline.  Antegrade  cardioplegia was administered; 500 mL were used to achieve a diastolic  arrest and initial septal cooling; 500 mL of cardioplegia were then  administered via the retrograde cannula and there was additional cooling to  10 degrees Celsius.  As noted, the left ventricle was decompressed via the  aortic root vent.  The following distal anastomoses then were performed.   First, a reversed saphenous vein graft was placed end-to-side to the  posterior descending branch.  The posterior descending was a 1.5-mm vessel  but tapered quickly to a 1.0-mm vessel distally.  There was diffuse disease  in the vessel.  The vein graft was anastomosed end-to-side with a running 7-  0 Prolene suture.  There was adequate flow through the graft.  Cardioplegia  was administered and there was good hemostasis.   Next, a reversed saphenous vein graft was placed sequentially to the obtuse  marginals 1 and 2.  Obtuse marginal 1 was subtotally occluded.  It was a 1.5-  mm fair-quality vessel.  OM-2 was the distal terminal marginal branch of the  circumflex.  It was a small vessel, being 1.5 mm at the site of anastomosis,  but quickly bifurcated into two vessels, through which only a 1-mm probe  would pass.  Both anastomosis were performed with a running 7-0 Prolene suture; both were probed proximally and distally prior to tying the suture   to ensure patency.  There was excellent flow through the graft.  Cardioplegia was administered.  There was good hemostasis of both  anastomoses.   Next, a reversed saphenous vein graft was placed end-to-side to the ramus  intermedius; this was a 1.5-mm intramyocardial vessel supplying the  anterolateral wall.  It had a 95% ostial stenosis.  The vein graft was of  smaller caliber but still of acceptable quality.  The anastomosis was  performed with a running 7-0 Prolene suture.  There was good flow through  the graft.  Cardioplegia was administered; there was good hemostasis.   Next, the left internal mammary artery was brought through a window in the  pericardium.  The distal end of the mammary was spatulated.  It was a 2-mm  good-quality conduit.  It was anastomosed end-to-side to the LAD, which was  a 1.5-mm fair-quality target.  It had diffuse posterior wall plaquing  throughout but a 1.5-mm probe did pass easily, both proximally and distally.  The anastomosis was performed end-to-side with a running 8-0 Prolene suture.  At the completion of the mammary-to-LAD anastomosis, the bulldog clamps were  briefly removed to inspect for hemostasis.  Immediate and rapid septal  rewarming was noted.  The bulldog clamp was replaced.  Additional  cardioplegia then was administered down the vein grafts and the aortic root.   The vein grafts then were cut to length and the proximal vein graft  anastomoses were performed through 4.0-mm punch aortotomies while under  cross-clamp.  At the completion of the final proximal anastomosis, the  bulldog clamp was again removed from the mammary artery and immediate and  rapid septal rewarming was noted.  Lidocaine was administered.  The aortic  root was deaired and after completely deairing the aortic root, the patient  was placed in Trendelenburg position and the aortic cross-clamp was removed.  Total cross-clamp time was 81 minutes.  The patient required a  single  defibrillation with 20 joules, then remained in sinus rhythm thereafter.   While the patient was being rewarmed, all proximal and distal anastomoses  were inspected for hemostasis.  Epicardial pacing wires were placed on the  right ventricle and right atrium.  When the patient had been rewarmed to a  core temperature of 37 degree Celsius, the intra-aortic balloon pump was  resumed at one-to-one and the patient was weaned from cardiopulmonary bypass  without difficulty and without inotropic support.  The total bypass time was  129 minutes.   A test dose of protamine was administered and was well-tolerated.  The  atrial and aortic cannulae were removed.  The remainder of the protamine was  administered without incident.  The chest was irrigated with 1 L of warm  normal saline containing 1 g of vancomycin and hemostasis was achieved.  The pericardium could not be reapproximated but the mediastinal fat was closed  over the aorta after removing the adhesions to the sternum.  A left pleural  and two mediastinal chest tubes were placed through separate subcostal  incisions.  The sternum was closed with heavy-gauge interrupted stainless  steel wires.  The remainder of the incisions were closed in standard fashion  with subcuticular skin closures.  All sponge, needle and instrument counts  were correct at the end of the procedure.  There were no intraoperative  complications and Mr. Nosbisch was taken from the operating room to the  surgical intensive care unit intubated and in critical but stable condition.                                               Salvatore Decent Dorris Fetch, M.D.    SCH/MEDQ  D:  04/29/2003  T:  04/29/2003  Job:  [id number]   cc:   Carole Binning, M.D. [facility]   Bradly Bienenstock, M.D.  [facility] Resident  [place], [place] [address]  Fax: [phone]

## 2010-11-26 NOTE — H&P (Signed)
NAME:  Patrick Wilcox, Patrick Wilcox NO.:  [id number]   MEDICAL RECORD NO.:  [id number]                   PATIENT TYPE:  EMS   LOCATION:  MINO                                 FACILITY:  [facility]   PHYSICIAN:  Carole Binning, M.D. [facility]         DATE OF BIRTH:  October 09, 1945   DATE OF ADMISSION:  04/26/2003  DATE OF DISCHARGE:                                HISTORY & PHYSICAL   CHIEF COMPLAINT:  Chest pain and shortness of breath.   HISTORY OF PRESENT ILLNESS:  Patrick Wilcox is a 65 year old African American  male with no known cardiac history.  Approximately one-and-a-half months  ago, he developed symptoms of exertional dyspnea.  He would note that with a  moderate degree of exertion that he would become short of breath, stop and  rest a few minutes, and his shortness of breath would resolve.  He has also  had a long history of symptoms of heartburn-type chest discomfort which he  has attributed to indigestion.  This is most noticeable after meals and it  occasionally wakes him up at night.  His chest discomfort has been relieved  with antacids.  Over the past several days, he has noted an increase in  frequency of his chest discomfort and dyspnea.  He has also noted some left  shoulder and arm tingling.  This morning his symptoms worsened and he  presented to the urgent care center where an EKG showed significant ST  segment depression in the inferior an anterolateral leads.  He was treated  with nitroglycerin, oxygen, and aspirin and transported emergently to [facility].  His chest pain was relieved with the above measures and he  is currently pain-free.   The patient's cardiac risk factors are positive for hypertension.  He denies  a known history of diabetes, hyperlipidemia, or significant tobacco abuse.  There is a family history of coronary artery disease, but not at an early  age.   PAST MEDICAL HISTORY:  1. Hypertension which had been treated  previously, but he stopped his     medications on his own.  He has not seen a physician in a long time.  2. History of a skin condition, question of eczema.   CURRENT MEDICATIONS:  None.   ALLERGIES:  No known drug allergies.   SOCIAL HISTORY, FAMILY HISTORY, AND REVIEW OF SYSTEMS:  As documented in the  physician assistant's admission note.   PHYSICAL EXAMINATION:  GENERAL APPEARANCE:  This is a well-appearing male in  no acute distress.  VITAL SIGNS:  Temperature 97.7 degrees, pulse 82, respirations 20, blood  pressure 163/95.  SKIN:  Warm and dry.  There is an eczematous-type rash over the shoulders  and upper back.  HEENT:  Normocephalic and atraumatic.  Sclerae anicteric.  Oral mucosa  unremarkable.  Dentition is poor.  NECK:  No adenopathy or thyromegaly.  No JVD.  Carotid upstroke normal  without bruit.  CHEST:  Clear to auscultation and percussion.  CARDIAC:  PMI nondisplaced.  Regular rate and rhythm.  Positive S4.  Normal  S1 and S2 without rub, murmur, or S3.  ABDOMEN:  Soft and nontender without organomegaly.  Normal bowel sounds.  No  bruits.  EXTREMITIES:  No cyanosis, clubbing, or edema.  Peripheral pulses are 2+  throughout.  There are no femoral bruits.   LABORATORY DATA:  The initial EKG at the urgent care center showed sinus  rhythm, left ventricular hypertrophy, and significant ST depression in the  inferior and anterolateral leads.  This was consistent with myocardial  ischemia.  Followup EKG currently with resolution of chest pain shows  significant improvement and near resolution of his ST segment depression.  Other laboratory data are notable for a troponin elevated at 1.49, a CK-MB  of 7.0, and myoglobin 196.  Other labs are as noted in the chart.  The chest  x-ray is pending.   ASSESSMENT:  The patient is a 65 year old male with hypertension.  Labs also  show an elevated glucose consistent with glucose intolerance.  He now  presents with chest pain  and elevated troponins with EKG changes compatible  with an acute coronary syndrome.  He is currently pain-free on medical  therapy.   PLAN:  The patient will be admitted to a stepdown bed.  He has been started  on heparin and aspirin.  Will add nitroglycerin and beta blockers.  Will  also start Integrillin.  Cardiac enzymes will be cycled and serial EKGs will  be followed.  We will proceed with cardiac catheterization.   Regarding his other cardiac risk factors, we will treat fasting lipids and  treat as appropriate.  His hypertension will also be treated.  Will check a  hemoglobin A1C to rule out diabetes.                                                Carole Binning, M.D. [facility]    MWP/MEDQ  D:  04/26/2003  T:  04/26/2003  Job:  [id number]   cc:   Patrick Wilcox, M.D.  [facility] Resident  [place], [place] [address]  Fax: [phone]

## 2010-11-26 NOTE — Cardiovascular Report (Signed)
NAME:  Patrick Wilcox, Patrick Wilcox NO.:  [id number]   MEDICAL RECORD NO.:  [id number]                   PATIENT TYPE:  OIB   LOCATION:  6533                                 FACILITY:  [facility]   PHYSICIAN:  Carole Binning, M.D. [facility]         DATE OF BIRTH:  1946/02/04   DATE OF PROCEDURE:  09/12/2003  DATE OF DISCHARGE:  09/13/2003                              CARDIAC CATHETERIZATION   PROCEDURES PERFORMED:  1. Left heart catheterization with coronary angiography, left     ventriculography, aortic root angiography, and bypass graft angiography.  2. Percutaneous transluminal coronary angioplasty with stent placement in     the first obtuse marginal branch via saphenous vein graft.   CARDIOLOGIST:  Carole Binning, M.D.   INDICATIONS:  Mr. Devera is a 65 year old male who underwent coronary artery  bypass surgery in October 2004 after presenting a non-Q wave myocardial  infarction.  He presented with symptoms of exertional chest pain and  exertional of nonsustained ventricular tachycardia.  His stress Cardiolite  performed in the office showed evidence of ischemia in the inferior wall as  well as mild ischemia in the mid and basal anterior wall.  We therefore  proceeded with cardiac catheterization.   CATHETERIZATION PROCEDURAL NOTE:  A 6 French sheath was placed in the right  femoral artery.  Native coronary angiography was performed with 6 [place] JL-  4 and JR-4 catheters.  The saphenous vein graft to the ramus intermedius was  visualized with a JR-4 catheter.  The sequential vein graft to the obtuse  marginal branch was imaged with an LCD catheter.  The saphenous vein graft  to the posterior descending artery was imaged with an AR-2 catheter.  The  internal mammary artery was imaged with an internal mammary catheter.   Left ventriculography and aortic angiography were performed using an angled  pigtail catheter.   CONTRAST MATERIAL:  Contrast was  Omnipaque.   COMPLICATIONS:  There were no complications.   RESULTS:   HEMODYNAMIC DATA:  Left ventricular pressure 172/25.  Aortic pressure  180/90.  There is no aortic valve gradient.   VENTRICULOGRAPHIC DATA:  Left Ventriculogram:  There is mild hypokinesis of  the inferior wall.  Otherwise wall motion is normal.  Ejection fraction is  estimated at greater than or equal to 60%.  There is no mitral  regurgitation.   ARTERIOGRAPHIC DATA:  Coronary Arteriography (Codominant)   Left Main:  The left main has a tubular 80% stenosis beginning in the  midvessel and extending into the proximal portion of the left circumflex.  In the distal portion of the left main there is a discrete 90% stenosis.   Left Anterior Descending Artery:  The left anterior descending artery is  100% occluded at its origin.  There is faint antegrade filling of the LAD.  The distal LAD fills via a left internal mammary graft.   Left Circumflex:  The left circumflex  has a diffuse 80% stenosis beginning  in its origin as described above.  The circumflex gives rise to a large  ramus intermedius, which is 100% occluded at its origin.  There is a normal-  sized first obtuse marginal branch and a small second obtuse marginal  branch, which arises from the distal circumflex.  These fill via saphenous  vein grafts.  The ramus intermedius also fills via saphenous vein graft.   Right Coronary Artery:  The right coronary artery is a relatively small  codominant vessel.  There is a diffuse 50% stenosis in the proximal vessel.  The distal vessel is 100% occluded.  The posterior descending artery fills  via saphenous vein graft.   Left internal mammary artery to the distal LAD is patent throughout its  course.  It fills the mid and distal LAD.  In the apical portion of the LAD  there is a tubular 70% stenosis.   Saphenous vein graft to the ramus intermedius is patent throughout its  course filling a large ramus  intermedius.   There is a sequential saphenous vein graft to the first and second obtuse  marginal branches.  The vein graft itself is patent.  There is a tubular 30%  stenosis in the vein graft just beyond the first anastomosis.  In the first  obtuse marginal branch just beyond the anastomosis with the vein graft there  is a discrete 95% stenosis.  This vein graft fills a normal-sized first  obtuse marginal and a small second obtuse marginal.   Saphenous vein graft to the posterior descending artery is a slender vessel  with a diffuse 30% stenosis in the proximal and mid graft.  This fills a  very small posterior descending artery.  There is an 80% stenosis in the  posterior descending artery just beyond the vein graft insertion.  This is a  very small caliber vessel, being 1.5 mm or less in diameter.   IMPRESSION:  1. Preserved left ventricular systolic function.  2. Native three-vessel coronary artery disease.  3. Status post coronary artery bypass surgery; all grafts are patent;     however, there is disease in the distal vessels as described.  There is     moderate disease in the apical left anterior descending.  There is     significant disease in the first obtuse marginal beyond the vein graft     insertion.  There is also significant disease in the posterior descending     artery beyond the vein graft insertion.  The posterior descending artery     is very small in caliber; however, the obtuse marginal branch is a normal-     sized vessel.   PLAN:  Percutaneous intervention of the first obtuse marginal via the  saphenous vein graft; see below.   PERCUTANEOUS TRANSLUMINAL CORONARY ANGIOPLASTY PROCEDURAL NOTE:  Following  completion of the diagnostic catheterization we proceeded with percutaneous  coronary intervention.  Heparin and Integrilin were administered per  protocol.  We utilized the preexisting 6 [place] in the right femoral artery. We used a 6 [place] LCD guiding  catheter.  A Hi-Torque floppy wire was  advanced under fluoroscopic guidance via the saphenous vein graft and into  the distal portion of the first obtuse marginal branch.  We then performed  PTCA of the first obtuse marginal branch with a 2.0 x 15 mm Maverick  balloon, a total of three inflations to eight, 10 and 10 atmospheres  respectively were performed for two minutes  in duration.  Angiographic  images after these inflations revealed improvement; however, there was  residual 50% stenosis at the lesion site.  We therefore positioned a 2.0 x 8  mm Mini-Vision stent across the lesion in the obtuse marginal and deployed  the stent at 14 atmospheres.  Intermittent doses of nitroglycerin were  administered.  Final angiographic images were then obtained revealing  patency of the obtuse marginal with 0% residual stenosis at the stent site  and TIMI III flow.   At the conclusion of the procedure a Perclose vascular closure device was  placed in the right femoral artery with good hemostasis.   COMPLICATIONS:  None.   RESULTS:  Successful percutaneous transluminal coronary angioplasty with  placement of a bare metal stent in the first obtuse marginal branch via  saphenous vein graft.  A 95% stenosis at the anastomosis with the vein graft  was reduced to 0% residual with TIMI III flow.   PLAN:  1. Integrilin will be continued for 18 hours.  2. The patient will be treated with Plavix for the recommended three to six     months.  3. The patient will be treated medically for his residual coronary artery     disease.  4. We anticipate follow up stress testing in approximately one month.                                               Carole Binning, M.D. [facility]    MWP/MEDQ  D:  09/12/2003  T:  09/13/2003  Job:  [id number]   cc:   Gordy Savers, M.D. [facility]   Cardiac Catheterization Laboratory

## 2010-11-26 NOTE — Discharge Summary (Signed)
NAME:  Patrick Wilcox, Patrick Wilcox NO.:  [id number]   MEDICAL RECORD NO.:  [id number]          PATIENT TYPE:  INP   LOCATION:  4730                         FACILITY:  [facility]   PHYSICIAN:  Cornell Barman, P.A. LHCDATE OF BIRTH:  01-Apr-1946   DATE OF ADMISSION:  10/26/2004  DATE OF DISCHARGE:                                 DISCHARGE SUMMARY   Audio too short to transcribe (less than 5 seconds)      LC/MEDQ  D:  10/28/2004  T:  10/28/2004  Job:  [id number]

## 2010-11-26 NOTE — Consult Note (Signed)
NAME:  Wilcox, Patrick                           ACCOUNT NO.:  [id number]   MEDICAL RECORD NO.:  [id number]                   PATIENT TYPE:  INP   LOCATION:  2308                                 FACILITY:  [facility]   PHYSICIAN:  Evelene Croon, M.D.                  DATE OF BIRTH:  1946-01-20   DATE OF CONSULTATION:  04/28/2003  DATE OF DISCHARGE:                                   CONSULTATION   REFERRING PHYSICIAN:  Carole Binning, M.D. [facility]   REASON FOR CONSULTATION:  High grade left main and severe three vessel  coronary artery disease status post non Q-wave myocardial infarction.   HISTORY OF PRESENT ILLNESS:  This patient is a 65 year old black male with  no prior cardiac history who reports having left shoulder and arm tingling  beginning two to three days prior to admission.  These episodes typically  occurred with exertion and lasted a few minutes and resolved with rest.  He  also noted increasing symptoms of reflux regularly after meals relieved with  treatment but his shoulder and arm pain were not improved by treatment for  reflux.  On April 26, 2003 he mowed part of his lawn, but developed  shortness of breath and pain in his shoulder and arm relieved with rest but  it recurred and he went to Urgent Care where electrocardiogram was abnormal.  He was transferred by EMS to [facility] and received two sublingual  nitroglycerin en route.  His electrocardiogram changes resolved and the  patient was pain-free on arrival.  He ruled in for non Q-wave myocardial  infarction.  His troponin level was 2.6 on admission and decreased to 2.2  yesterday.  His CPK on admission was 250 with an MB of 15.9.  It decreased  to 218 with an MB of 10.5 yesterday.  He underwent cardiac catheterization  late this afternoon which showed a 95% distal left main trifurcation  stenosis compromising the LAD, a large intermediate, and the left  circumflex.  In addition, the LAD had about 70% mid vessel  stenosis.  There  was a large first marginal that was occluded and filled by collaterals.  The  right coronary artery had about 80% ostial stenosis and 80% mid stenosis and  a 99% distal stenosis with TIMI 2 flow into the distal vessel.  Left  ventricular ejection fraction was about 55% with apical hypokinesis.  There  is no mitral regurgitation.  There is no significant gradient across the  aortic valve.  An intraaortic balloon pump was placed due to the severity of  the anatomy but he remains pain-free since admission.   REVIEW OF SYSTEMS:  GENERAL:  He has had no fever or chills.  He has had  fatigue for several months.  HEENT:  Eyes:  Negative.  ENT:  Negative.  ENDOCRINE:  He denies diabetes and hypothyroidism.  CARDIOVASCULAR:  As  above.  He has had dyspnea on exertion for about six weeks.  Denies PND or  orthopnea.  He has had no symptoms at rest.  RESPIRATORY:  Denies cough and  sputum production.  GASTROINTESTINAL:  He has had no nausea or vomiting.  Denies melena and bright red blood per rectum.  He has had some symptoms  that he thought were due to gastroesophageal reflux.  GENITOURINARY:  He  denies dysuria and hematuria.  NEUROLOGIC:  He has had no focal weakness or  numbness.  He denies dizziness and syncope.  PSYCHIATRIC:  Negative.  MUSCULOSKELETAL:  No arthralgias or myalgias.  ALLERGIES:  None.   PAST MEDICAL HISTORY:  Hypertension.  He has no history of hyperlipidemia.   PAST SURGICAL HISTORY:  He has had surgery on his right hand for trauma.   FAMILY HISTORY:  Positive for coronary disease.  His father died age 62 of  massive heart attack.  His mother also had coronary disease, but died of  cancer.   SOCIAL HISTORY:  He is retired from [employer].  He has never smoked  and does not abuse alcohol.   PHYSICAL EXAMINATION:  VITAL SIGNS:  Blood pressure 130/70, pulse 70 and  regular, respiratory rate 18 and unlabored.  GENERAL:  He is a well-developed black  male in no distress.  HEENT:  Normocephalic, atraumatic.  Pupils are equal, round, and reactive to  light and accommodation.  Extraocular muscles are intact.  He has some fresh  blood in his mouth which he said occurred after brushing his teeth this  evening.  He does have a history of pyorrhea of the gums.  Teeth are in fair  condition but his gums are inflamed and bleeding.  NECK:  Normal carotid pulses bilaterally.  There are no bruits.  There is no  adenopathy or thyromegaly.  CARDIAC:  Regular rate and rhythm with normal S1 and S2.  There is no  murmur, rub, or gallop.  LUNGS:  Clear.  ABDOMEN:  Active bowel sounds.  Soft and nontender.  There are no palpable  masses or organomegaly.  EXTREMITIES:  No peripheral edema.  Pedal pulses are palpable bilaterally.  SKIN:  Warm and dry.  NEUROLOGIC:  Alert and oriented x3.  I cannot evaluate his motor function  due to bed rest.  Sensation is intact bilaterally.   LABORATORIES:  Chest x-ray shows no active disease.   Electrocardiogram yesterday shows normal sinus rhythm with nonspecific ST  and T-wave changes.  His hemoglobin this morning was 12.0 with a platelet  count of 206,000.  Coagulation profile was normal.  Electrolytes were normal  with a BUN of 6 and creatinine of 0.8.   IMPRESSION:  Patrick Wilcox has severe left main and three vessel coronary  disease status post non Q-wave myocardial infarction.  I agree that coronary  artery bypass graft surgery is indicated for prevention of further ischemia  and infarction and death.  I discussed the operative procedure with the  patient and his family including alternatives, benefits, and risks including  bleeding, blood transfusion, infection, stroke, myocardial infarction, and  death.  Salvatore Decent Dorris Fetch, M.D. will plan to do his surgery in the  morning.  The patient and his family are in agreement with that plan.  Evelene Croon, M.D.     BB/MEDQ  D:  04/28/2003  T:  04/29/2003  Job:  [id number]   cc:   Carole Binning, M.D. [facility]

## 2010-12-15 ENCOUNTER — Ambulatory Visit (INDEPENDENT_AMBULATORY_CARE_PROVIDER_SITE_OTHER): Payer: 59 | Admitting: Internal Medicine

## 2010-12-15 ENCOUNTER — Encounter: Payer: Self-pay | Admitting: Internal Medicine

## 2010-12-15 DIAGNOSIS — E119 Type 2 diabetes mellitus without complications: Secondary | ICD-10-CM

## 2010-12-15 DIAGNOSIS — I2510 Atherosclerotic heart disease of native coronary artery without angina pectoris: Secondary | ICD-10-CM

## 2010-12-15 DIAGNOSIS — E785 Hyperlipidemia, unspecified: Secondary | ICD-10-CM

## 2010-12-15 DIAGNOSIS — I10 Essential (primary) hypertension: Secondary | ICD-10-CM

## 2010-12-15 LAB — HEMOGLOBIN A1C: Hgb A1c MFr Bld: 7.8 % — ABNORMAL HIGH (ref 4.6–6.5)

## 2010-12-15 NOTE — Patient Instructions (Signed)
You need to lose weight.  Consider a lower calorie diet and regular exercise.   Please check your hemoglobin A1c every 3 months   It is important that you exercise regularly, at least 20 minutes 3 to 4 times per week.  If you develop chest pain or shortness of breath seek  medical attention.  Please check your blood pressure on a regular basis.  If it is consistently greater than 150/90, please make an office appointment.

## 2010-12-15 NOTE — Progress Notes (Signed)
  Subjective:    Patient ID: Patrick Wilcox, male    DOB: 1946/05/16, 65 y.o.   MRN: [id number]  HPI   65 year old patient who is seen today for followup of his type 2 diabetes. His last hemoglobin A1c was 7.3 but trending down. He feels well today except for some right ear discomfort and some left hip discomfort. He also describes an occasional tinnitus on the right. He has treated hypertension which has been stable he has dyslipidemia controlled on atorvastatin he continues to tolerate his medications well. He also has a history of gout which has been quiescent.  He denies any cardiopulmonary complaints he does have coronary artery disease.    Review of Systems  Constitutional: Negative for fever, chills, appetite change and fatigue.  HENT: Negative for hearing loss, ear pain, congestion, sore throat, trouble swallowing, neck stiffness, dental problem, voice change and tinnitus.   Eyes: Negative for pain, discharge and visual disturbance.  Respiratory: Negative for cough, chest tightness, wheezing and stridor.   Cardiovascular: Negative for chest pain, palpitations and leg swelling.  Gastrointestinal: Negative for nausea, vomiting, abdominal pain, diarrhea, constipation, blood in stool and abdominal distention.  Genitourinary: Negative for urgency, hematuria, flank pain, discharge, difficulty urinating and genital sores.  Musculoskeletal: Negative for myalgias, back pain, joint swelling, arthralgias and gait problem.  Skin: Negative for rash.  Neurological: Negative for dizziness, syncope, speech difficulty, weakness, numbness and headaches.  Hematological: Negative for adenopathy. Does not bruise/bleed easily.  Psychiatric/Behavioral: Negative for behavioral problems and dysphoric mood. The patient is not nervous/anxious.        Objective:   Physical Exam  Constitutional: He is oriented to person, place, and time. He appears well-developed.  HENT:  Head: Normocephalic.  Right Ear:  External ear normal.  Left Ear: External ear normal.  Eyes: Conjunctivae and EOM are normal.  Neck: Normal range of motion.  Cardiovascular: Normal rate and normal heart sounds.   Pulmonary/Chest: Breath sounds normal.  Abdominal: Bowel sounds are normal.  Musculoskeletal: Normal range of motion. He exhibits no edema and no tenderness.  Neurological: He is alert and oriented to person, place, and time.  Psychiatric: He has a normal mood and affect. His behavior is normal.          Assessment & Plan:   Diabetes mellitus. Last hemoglobin A1c 7.3 by weight loss exercise all encouraged he is on maximal oral medication Hypertension. Presently high normal reading today we'll continue to observe Dyslipidemia. Will controlled on atorvastatin Coronary artery disease stable

## 2010-12-16 ENCOUNTER — Telehealth: Payer: Self-pay | Admitting: *Deleted

## 2010-12-16 NOTE — Telephone Encounter (Signed)
Left message for pt to call back  °

## 2010-12-16 NOTE — Telephone Encounter (Signed)
Message copied by Romualdo Bolk on Thu Dec 16, 2010  1:39 PM ------      Message from: Gordy Savers      Created: Thu Dec 16, 2010 11:18 AM       Please schedule an appointment next week to initiate Victoza

## 2010-12-20 NOTE — Telephone Encounter (Signed)
Spoke with pt - informed of A1c and need appt to start additiona med. - transferred to [facility] for appt -

## 2010-12-20 NOTE — Progress Notes (Signed)
Quick Note:  Spoke with pt - informed of lab and transferred to [facility] to schedule appt - pt cant come until Tuesday of next week ______

## 2010-12-28 ENCOUNTER — Ambulatory Visit (INDEPENDENT_AMBULATORY_CARE_PROVIDER_SITE_OTHER): Payer: Medicare Other | Admitting: Internal Medicine

## 2010-12-28 ENCOUNTER — Encounter: Payer: Self-pay | Admitting: Internal Medicine

## 2010-12-28 DIAGNOSIS — E119 Type 2 diabetes mellitus without complications: Secondary | ICD-10-CM

## 2010-12-28 DIAGNOSIS — I10 Essential (primary) hypertension: Secondary | ICD-10-CM

## 2010-12-28 LAB — GLUCOSE, POCT (MANUAL RESULT ENTRY): POC Glucose: 247

## 2010-12-28 NOTE — Patient Instructions (Signed)
Please check your hemoglobin A1c every 3 months  You need to lose weight.  Consider a lower calorie diet and regular exercise.    It is important that you exercise regularly, at least 20 minutes 3 to 4 times per week.  If you develop chest pain or shortness of breath seek  medical attention. 

## 2010-12-28 NOTE — Progress Notes (Signed)
  Subjective:    Patient ID: Patrick Wilcox, male    DOB: Mar 29, 1946, 65 y.o.   MRN: [id number]  HPI 65 year old patient who is seen today for further management of his diabetes. He was seen recently and his hemoglobin A1c has increased from 7.3-7.9. His hemoglobin A1c has not been less than 7 in the recent past. He feels well. He was asked to come in today to start additional therapy but after further discussion he really wishes to avoid daily injections. He seems committed to drastic lifestyle changes and will give him another 3 months at weight loss better eating habits and diet. He is on maximal oral medications  Review of Systems     Objective:   Physical Exam  Constitutional: He appears well-nourished. No distress.       Overweight no distress          Assessment & Plan:   Diabetes mellitus type 2. Lifestyle changes discussed at length he wishes to avoid injection therapy if at all possible. We'll recheck in 3 months with another hemoglobin A1c

## 2011-01-26 ENCOUNTER — Other Ambulatory Visit: Payer: Self-pay | Admitting: Internal Medicine

## 2011-03-21 ENCOUNTER — Ambulatory Visit (INDEPENDENT_AMBULATORY_CARE_PROVIDER_SITE_OTHER): Payer: Medicare Other | Admitting: Internal Medicine

## 2011-03-21 ENCOUNTER — Encounter: Payer: Self-pay | Admitting: Internal Medicine

## 2011-03-21 DIAGNOSIS — E785 Hyperlipidemia, unspecified: Secondary | ICD-10-CM

## 2011-03-21 DIAGNOSIS — I2510 Atherosclerotic heart disease of native coronary artery without angina pectoris: Secondary | ICD-10-CM

## 2011-03-21 DIAGNOSIS — E119 Type 2 diabetes mellitus without complications: Secondary | ICD-10-CM

## 2011-03-21 DIAGNOSIS — I10 Essential (primary) hypertension: Secondary | ICD-10-CM

## 2011-03-21 LAB — HEMOGLOBIN A1C: Hgb A1c MFr Bld: 6.8 % — ABNORMAL HIGH (ref 4.6–6.5)

## 2011-03-21 MED ORDER — FUROSEMIDE 20 MG PO TABS: 20.0000 mg | ORAL_TABLET | Freq: Every day | ORAL | Status: DC

## 2011-03-21 NOTE — Progress Notes (Signed)
  Subjective:    Patient ID: Patrick Wilcox, male    DOB: Jul 01, 1946, 65 y.o.   MRN: [id number]  HPI  Wt Readings from Last 3 Encounters:  03/21/11 219 lb (99.338 kg)  12/28/10 220 lb (99.791 kg)  12/15/10 220 lb (99.23 kg)     65 year old patient who is in today for followup of his type 2 diabetes. His last hemoglobin A1c had increased to 7.8. He states fasting blood sugars are under reasonable control. There's been no weight loss. He feels well. There's been no gout He has a history of hypertension and has been compliant with his medications. He has dyslipidemia and coronary artery disease. Denies any cardiac symptoms. The eye exam in over 2 years.    Review of Systems  Constitutional: Negative for fever, chills, appetite change and fatigue.  HENT: Negative for hearing loss, ear pain, congestion, sore throat, trouble swallowing, neck stiffness, dental problem, voice change and tinnitus.   Eyes: Negative for pain, discharge and visual disturbance.  Respiratory: Negative for cough, chest tightness, wheezing and stridor.   Cardiovascular: Negative for chest pain, palpitations and leg swelling.  Gastrointestinal: Negative for nausea, vomiting, abdominal pain, diarrhea, constipation, blood in stool and abdominal distention.  Genitourinary: Negative for urgency, hematuria, flank pain, discharge, difficulty urinating and genital sores.  Musculoskeletal: Negative for myalgias, back pain, joint swelling, arthralgias and gait problem.  Skin: Negative for rash.  Neurological: Negative for dizziness, syncope, speech difficulty, weakness, numbness and headaches.  Hematological: Negative for adenopathy. Does not bruise/bleed easily.  Psychiatric/Behavioral: Negative for behavioral problems and dysphoric mood. The patient is not nervous/anxious.        Objective:   Physical Exam  Constitutional: He is oriented to person, place, and time. He appears well-developed.       Blood pressure 160/94    HENT:  Head: Normocephalic.  Right Ear: External ear normal.  Left Ear: External ear normal.  Eyes: Conjunctivae and EOM are normal.  Neck: Normal range of motion.  Cardiovascular: Normal rate and normal heart sounds.   Pulmonary/Chest: Breath sounds normal.       Sternotomy scar  Abdominal: Bowel sounds are normal.  Musculoskeletal: Normal range of motion. He exhibits no edema and no tenderness.  Neurological: He is alert and oriented to person, place, and time.  Psychiatric: He has a normal mood and affect. His behavior is normal.          Assessment & Plan:   Hypertension poor control. Compliance issues stressed we'll place on furosemide 20 mg every morning. Recheck 3 weeks Diabetes mellitus. We'll check a hemoglobin A1c Coronary artery disease stable Dyslipidemia  Recheck 3 weeks Low salt diet encouraged

## 2011-03-21 NOTE — Patient Instructions (Signed)
Please see your ophthalmologist for an annual exam Limit your sodium (Salt) intake   Please check your hemoglobin A1c every 3 months

## 2011-04-08 ENCOUNTER — Other Ambulatory Visit: Payer: Self-pay | Admitting: Internal Medicine

## 2011-04-12 ENCOUNTER — Other Ambulatory Visit: Payer: Self-pay | Admitting: Internal Medicine

## 2011-04-13 ENCOUNTER — Encounter: Payer: Self-pay | Admitting: Internal Medicine

## 2011-04-13 ENCOUNTER — Ambulatory Visit (INDEPENDENT_AMBULATORY_CARE_PROVIDER_SITE_OTHER): Payer: Medicare Other | Admitting: Internal Medicine

## 2011-04-13 VITALS — BP 120/80 | Temp 97.9°F | Wt 217.0 lb

## 2011-04-13 DIAGNOSIS — Z0000 Encounter for general adult medical examination without abnormal findings: Secondary | ICD-10-CM

## 2011-04-13 DIAGNOSIS — Z23 Encounter for immunization: Secondary | ICD-10-CM

## 2011-04-13 DIAGNOSIS — I10 Essential (primary) hypertension: Secondary | ICD-10-CM

## 2011-04-13 DIAGNOSIS — E785 Hyperlipidemia, unspecified: Secondary | ICD-10-CM

## 2011-04-13 DIAGNOSIS — I2510 Atherosclerotic heart disease of native coronary artery without angina pectoris: Secondary | ICD-10-CM

## 2011-04-13 DIAGNOSIS — E119 Type 2 diabetes mellitus without complications: Secondary | ICD-10-CM

## 2011-04-14 ENCOUNTER — Encounter: Payer: Self-pay | Admitting: Internal Medicine

## 2011-04-14 NOTE — Patient Instructions (Signed)
Please check your hemoglobin A1c every 3 months  Please see your eye doctor yearly to check for diabetic eye damage  Limit your sodium (Salt) intake    It is important that you exercise regularly, at least 20 minutes 3 to 4 times per week.  If you develop chest pain or shortness of breath seek  medical attention.

## 2011-04-14 NOTE — Progress Notes (Signed)
  Subjective:    Patient ID: Patrick Wilcox, male    DOB: 09/12/1945, 65 y.o.   MRN: [id number]  HPI  65 year old patient who is seen today for his quarterly followup. Medical problems include type 2 diabetes. This has a well-controlled on metformin therapy he has a dyslipidemia and remains on atorvastatin he has coronary artery disease. He denies any exertional chest pain. He does participate in a walking program with his church. In general doing quite well. His only complaint is a dry scaly rash involving his right knee area.    Review of Systems  Constitutional: Negative for fever, chills, appetite change and fatigue.  HENT: Negative for hearing loss, ear pain, congestion, sore throat, trouble swallowing, neck stiffness, dental problem, voice change and tinnitus.   Eyes: Negative for pain, discharge and visual disturbance.  Respiratory: Negative for cough, chest tightness, wheezing and stridor.   Cardiovascular: Negative for chest pain, palpitations and leg swelling.  Gastrointestinal: Negative for nausea, vomiting, abdominal pain, diarrhea, constipation, blood in stool and abdominal distention.  Genitourinary: Negative for urgency, hematuria, flank pain, discharge, difficulty urinating and genital sores.  Musculoskeletal: Negative for myalgias, back pain, joint swelling, arthralgias and gait problem.  Skin: Positive for rash.  Neurological: Negative for dizziness, syncope, speech difficulty, weakness, numbness and headaches.  Hematological: Negative for adenopathy. Does not bruise/bleed easily.  Psychiatric/Behavioral: Negative for behavioral problems and dysphoric mood. The patient is not nervous/anxious.        Objective:   Physical Exam  Constitutional: He is oriented to person, place, and time. He appears well-developed.  HENT:  Head: Normocephalic.  Right Ear: External ear normal.  Left Ear: External ear normal.       No retinopathy  Eyes: Conjunctivae and EOM are normal.  Neck:  Normal range of motion.  Cardiovascular: Normal rate and normal heart sounds.        Status post sternotomy  Pulmonary/Chest: Breath sounds normal.  Abdominal: Bowel sounds are normal.  Musculoskeletal: Normal range of motion. He exhibits no edema and no tenderness.  Neurological: He is alert and oriented to person, place, and time.  Skin:       Dry scaly dermatitis involving the right medial area  Psychiatric: He has a normal mood and affect. His behavior is normal.          Assessment & Plan:   Hypertension stable coronary artery disease stable we'll continue present regimen Type 2 diabetes. We'll check a hemoglobin A1c Nonspecific dermatitis. Will treat with short-term triamcinolone cream  We'll recheck in 3 months all medications reviewed On examination recommended

## 2011-04-15 LAB — HEMOGLOBIN A1C: Hgb A1c MFr Bld: 7.6 % — ABNORMAL HIGH (ref 4.6–6.5)

## 2011-04-18 NOTE — Progress Notes (Signed)
Quick Note:  Spoke with pt - informed of dr. Vernon Prey instructions ______

## 2011-05-22 ENCOUNTER — Other Ambulatory Visit: Payer: Self-pay | Admitting: Internal Medicine

## 2011-06-16 ENCOUNTER — Other Ambulatory Visit: Payer: Self-pay | Admitting: Internal Medicine

## 2011-08-22 ENCOUNTER — Encounter: Payer: Self-pay | Admitting: Internal Medicine

## 2011-08-22 ENCOUNTER — Ambulatory Visit (INDEPENDENT_AMBULATORY_CARE_PROVIDER_SITE_OTHER): Payer: Medicare Other | Admitting: Internal Medicine

## 2011-08-22 DIAGNOSIS — E119 Type 2 diabetes mellitus without complications: Secondary | ICD-10-CM

## 2011-08-22 DIAGNOSIS — I2510 Atherosclerotic heart disease of native coronary artery without angina pectoris: Secondary | ICD-10-CM

## 2011-08-22 DIAGNOSIS — I10 Essential (primary) hypertension: Secondary | ICD-10-CM

## 2011-08-22 LAB — HEMOGLOBIN A1C: Hgb A1c MFr Bld: 8.1 % — ABNORMAL HIGH (ref 4.6–6.5)

## 2011-08-22 LAB — HM DIABETES FOOT EXAM

## 2011-08-22 MED ORDER — METFORMIN HCL 1000 MG PO TABS: 1000.0000 mg | ORAL_TABLET | Freq: Two times a day (BID) | ORAL | Status: DC

## 2011-08-22 MED ORDER — ATORVASTATIN CALCIUM 40 MG PO TABS: 40.0000 mg | ORAL_TABLET | Freq: Every day | ORAL | Status: DC

## 2011-08-22 MED ORDER — RAMIPRIL 10 MG PO CAPS
10.0000 mg | ORAL_CAPSULE | Freq: Every day | ORAL | Status: DC
Start: 2011-08-22 — End: 2012-02-28

## 2011-08-22 MED ORDER — METOPROLOL TARTRATE 100 MG PO TABS: 100.0000 mg | ORAL_TABLET | Freq: Two times a day (BID) | ORAL | Status: DC

## 2011-08-22 MED ORDER — AMLODIPINE BESYLATE 10 MG PO TABS: 10.0000 mg | ORAL_TABLET | Freq: Every day | ORAL | Status: DC

## 2011-08-22 MED ORDER — FUROSEMIDE 20 MG PO TABS: 20.0000 mg | ORAL_TABLET | Freq: Every day | ORAL | Status: DC

## 2011-08-22 MED ORDER — ALLOPURINOL 300 MG PO TABS: 300.0000 mg | ORAL_TABLET | Freq: Every day | ORAL | Status: DC

## 2011-08-22 MED ORDER — SITAGLIPTIN PHOSPHATE 100 MG PO TABS: 100.0000 mg | ORAL_TABLET | Freq: Every day | ORAL | Status: DC

## 2011-08-22 MED ORDER — VARDENAFIL HCL 20 MG PO TABS: 20.0000 mg | ORAL_TABLET | Freq: Every day | ORAL | Status: DC | PRN

## 2011-08-22 MED ORDER — GLIMEPIRIDE 4 MG PO TABS: 4.0000 mg | ORAL_TABLET | Freq: Every day | ORAL | Status: DC

## 2011-08-22 NOTE — Progress Notes (Signed)
  Subjective:    Patient ID: Patrick Wilcox, male    DOB: Jun 06, 1946, 66 y.o.   MRN: [id number]  HPI  a 66 year old patient who is seen today for followup. He has a history of coronary artery disease and prior MI. Denies any exertional chest pain. He has treated hypertension and states that he has been out of his metoprolol for some time. He also states that he had a can of soup for dinner last night. Blood pressure today moderately elevated He has a history of type 2 diabetes. He states that his glycemic control has done well except this morning. He states that he had 2 tattoos last night. He has dyslipidemia and a history of gout. There has been no gouty arthritis. He remains on atorvastatin which he tolerates well   Wt Readings from Last 3 Encounters:  08/22/11 217 lb (98.431 kg)  04/13/11 217 lb (98.431 kg)  03/21/11 219 lb (99.338 kg)     Review of Systems  Constitutional: Negative for fever, chills, appetite change and fatigue.  HENT: Negative for hearing loss, ear pain, congestion, sore throat, trouble swallowing, neck stiffness, dental problem, voice change and tinnitus.   Eyes: Negative for pain, discharge and visual disturbance.  Respiratory: Negative for cough, chest tightness, wheezing and stridor.   Cardiovascular: Negative for chest pain, palpitations and leg swelling.  Gastrointestinal: Negative for nausea, vomiting, abdominal pain, diarrhea, constipation, blood in stool and abdominal distention.  Genitourinary: Negative for urgency, hematuria, flank pain, discharge, difficulty urinating and genital sores.  Musculoskeletal: Negative for myalgias, back pain, joint swelling, arthralgias and gait problem.  Skin: Negative for rash.  Neurological: Negative for dizziness, syncope, speech difficulty, weakness, numbness and headaches.  Hematological: Negative for adenopathy. Does not bruise/bleed easily.  Psychiatric/Behavioral: Negative for behavioral problems and dysphoric mood. The  patient is not nervous/anxious.        Objective:   Physical Exam  Constitutional: He is oriented to person, place, and time. He appears well-developed.       Blood pressure 180/94  HENT:  Head: Normocephalic.  Right Ear: External ear normal.  Left Ear: External ear normal.  Eyes: Conjunctivae and EOM are normal.  Neck: Normal range of motion.  Cardiovascular: Normal rate and normal heart sounds.   Pulmonary/Chest: Breath sounds normal.  Abdominal: Bowel sounds are normal.  Musculoskeletal: Normal range of motion. He exhibits no edema and no tenderness.  Neurological: He is alert and oriented to person, place, and time.  Psychiatric: He has a normal mood and affect. His behavior is normal.          Assessment & Plan:    Hypertension poor control. Compliance stressed low-salt diet also encouraged we'll recheck in 4 weeks Diabetes mellitus. We'll check a hemoglobin A1c Dyslipidemia stable Coronary artery disease stable  Reassess 4 weeks. All medications refilled

## 2011-08-22 NOTE — Patient Instructions (Signed)
Limit your sodium (Salt) intake  Please check your blood pressure on a regular basis.  If it is consistently greater than 150/90, please make an office appointment.   

## 2011-08-23 ENCOUNTER — Other Ambulatory Visit: Payer: Self-pay | Admitting: Internal Medicine

## 2011-08-23 MED ORDER — PIOGLITAZONE HCL 15 MG PO TABS: 15.0000 mg | ORAL_TABLET | Freq: Every day | ORAL | Status: DC

## 2011-08-23 NOTE — Progress Notes (Signed)
Quick Note:  Spoke with pt - informed of lab and new med to start. Called in rx to [facility] ______

## 2011-09-19 ENCOUNTER — Encounter: Payer: Self-pay | Admitting: Internal Medicine

## 2011-09-19 ENCOUNTER — Ambulatory Visit (INDEPENDENT_AMBULATORY_CARE_PROVIDER_SITE_OTHER): Payer: Medicare Other | Admitting: Internal Medicine

## 2011-09-19 DIAGNOSIS — E119 Type 2 diabetes mellitus without complications: Secondary | ICD-10-CM

## 2011-09-19 DIAGNOSIS — I10 Essential (primary) hypertension: Secondary | ICD-10-CM

## 2011-09-19 MED ORDER — PIOGLITAZONE HCL 45 MG PO TABS: 45.0000 mg | ORAL_TABLET | Freq: Every day | ORAL | Status: DC

## 2011-09-19 NOTE — Patient Instructions (Signed)
Limit your sodium (Salt) intake  Please check your blood pressure on a regular basis.  If it is consistently greater than 150/90, please make an office appointment.  Return office visit 2 months  You need to lose weight.  Consider a lower calorie diet and regular exercise.

## 2011-09-19 NOTE — Progress Notes (Signed)
  Subjective:    Patient ID: Eames Dibiasio, male    DOB: 1946-06-08, 66 y.o.   MRN: [id number]  HPI  66 year old patient who is seen today for followup of hypertension. He was seen one month ago with an elevated blood pressure;  he is on triple therapy plus diuretic therapy. He feels well He has type 2 diabetes and hemoglobin A1c was elevated at 8.1. Lifestyle issues discussed    Review of Systems  Constitutional: Negative.   Respiratory: Negative.   Cardiovascular: Negative.        Objective:   Physical Exam  Constitutional: He is oriented to person, place, and time. He appears well-developed and well-nourished. No distress.       Blood pressure 148/80  HENT:  Head: Normocephalic.  Right Ear: External ear normal.  Left Ear: External ear normal.  Eyes: Conjunctivae and EOM are normal.  Neck: Normal range of motion.  Cardiovascular: Normal rate and normal heart sounds.   Pulmonary/Chest: Breath sounds normal.  Abdominal: Bowel sounds are normal.  Musculoskeletal: Normal range of motion. He exhibits no edema and no tenderness.  Neurological: He is alert and oriented to person, place, and time.  Psychiatric: He has a normal mood and affect. His behavior is normal.          Assessment & Plan:   Hypertension. We'll continue present regimen was stressed low salt diet exercise weight loss Diabetes mellitus. We'll increase Actos to 45 mg daily I saw issues discussed. Recheck 2 months

## 2011-11-17 ENCOUNTER — Encounter: Payer: Self-pay | Admitting: Internal Medicine

## 2011-11-17 ENCOUNTER — Ambulatory Visit (INDEPENDENT_AMBULATORY_CARE_PROVIDER_SITE_OTHER): Payer: Medicare Other | Admitting: Internal Medicine

## 2011-11-17 VITALS — BP 140/90 | Wt 215.0 lb

## 2011-11-17 DIAGNOSIS — M109 Gout, unspecified: Secondary | ICD-10-CM

## 2011-11-17 DIAGNOSIS — I10 Essential (primary) hypertension: Secondary | ICD-10-CM

## 2011-11-17 DIAGNOSIS — E119 Type 2 diabetes mellitus without complications: Secondary | ICD-10-CM

## 2011-11-17 DIAGNOSIS — IMO0001 Reserved for inherently not codable concepts without codable children: Secondary | ICD-10-CM

## 2011-11-17 LAB — HEMOGLOBIN A1C: Hgb A1c MFr Bld: 6.9 % — ABNORMAL HIGH (ref 4.6–6.5)

## 2011-11-17 MED ORDER — COLCHICINE 0.6 MG PO TABS: 0.6000 mg | ORAL_TABLET | Freq: Every day | ORAL | Status: DC

## 2011-11-17 NOTE — Progress Notes (Signed)
  Subjective:    Patient ID: Patrick Wilcox, male    DOB: [birth year], 66 y.o.   MRN: [id number]  HPI  66 year old patient who is seen today for followup of hypertension and type 2 diabetes. 3 months ago his hemoglobin A1c was 8.1 and Actos was added to his regimen. 2 months ago this was up titrated to 45 mg daily. He has hypertension treated with multiple drugs. He feels well today. No weight loss  Wt Readings from Last 3 Encounters:  11/17/11 215 lb (97.523 kg)  09/19/11 215 lb (97.523 kg)  08/22/11 217 lb (98.431 kg)    Review of Systems  Constitutional: Negative for fever, chills, appetite change and fatigue.  HENT: Negative for hearing loss, ear pain, congestion, sore throat, trouble swallowing, neck stiffness, dental problem, voice change and tinnitus.   Eyes: Negative for pain, discharge and visual disturbance.  Respiratory: Negative for cough, chest tightness, wheezing and stridor.   Cardiovascular: Negative for chest pain, palpitations and leg swelling.  Gastrointestinal: Negative for nausea, vomiting, abdominal pain, diarrhea, constipation, blood in stool and abdominal distention.  Genitourinary: Negative for urgency, hematuria, flank pain, discharge, difficulty urinating and genital sores.  Musculoskeletal: Negative for myalgias, back pain, joint swelling, arthralgias and gait problem.  Skin: Negative for rash.  Neurological: Negative for dizziness, syncope, speech difficulty, weakness, numbness and headaches.  Hematological: Negative for adenopathy. Does not bruise/bleed easily.  Psychiatric/Behavioral: Negative for behavioral problems and dysphoric mood. The patient is not nervous/anxious.        Objective:   Physical Exam  Constitutional: He appears well-developed and well-nourished. No distress.       Weight 2:15 Blood pressure 146/78          Assessment & Plan:   Hypertension Type 2 diabetes  Nonpharmacologic measures discussed and encouraged especially weight  loss and exercise. We'll check a hemoglobin A1c. Recheck in 3 months

## 2011-11-17 NOTE — Patient Instructions (Signed)
Limit your sodium (Salt) intake    It is important that you exercise regularly, at least 20 minutes 3 to 4 times per week.  If you develop chest pain or shortness of breath seek  medical attention.  You need to lose weight.  Consider a lower calorie diet and regular exercise.   Please check your hemoglobin A1c every 3 months   

## 2012-02-21 ENCOUNTER — Ambulatory Visit: Payer: Medicare Other | Admitting: Internal Medicine

## 2012-02-28 ENCOUNTER — Telehealth: Payer: Self-pay | Admitting: Internal Medicine

## 2012-02-28 ENCOUNTER — Ambulatory Visit (INDEPENDENT_AMBULATORY_CARE_PROVIDER_SITE_OTHER): Payer: Medicare Other | Admitting: Internal Medicine

## 2012-02-28 ENCOUNTER — Encounter: Payer: Self-pay | Admitting: Internal Medicine

## 2012-02-28 VITALS — BP 160/94 | Temp 98.3°F | Wt 221.0 lb

## 2012-02-28 DIAGNOSIS — I10 Essential (primary) hypertension: Secondary | ICD-10-CM

## 2012-02-28 DIAGNOSIS — E119 Type 2 diabetes mellitus without complications: Secondary | ICD-10-CM

## 2012-02-28 DIAGNOSIS — I2510 Atherosclerotic heart disease of native coronary artery without angina pectoris: Secondary | ICD-10-CM

## 2012-02-28 DIAGNOSIS — E785 Hyperlipidemia, unspecified: Secondary | ICD-10-CM

## 2012-02-28 LAB — HEMOGLOBIN A1C: Hgb A1c MFr Bld: 8 % — ABNORMAL HIGH (ref 4.6–6.5)

## 2012-02-28 MED ORDER — METOPROLOL TARTRATE 100 MG PO TABS: 100.0000 mg | ORAL_TABLET | Freq: Two times a day (BID) | ORAL | Status: DC

## 2012-02-28 MED ORDER — SITAGLIPTIN PHOSPHATE 100 MG PO TABS: 100.0000 mg | ORAL_TABLET | Freq: Every day | ORAL | Status: DC

## 2012-02-28 MED ORDER — PIOGLITAZONE HCL 45 MG PO TABS: 45.0000 mg | ORAL_TABLET | Freq: Every day | ORAL | Status: DC

## 2012-02-28 MED ORDER — FUROSEMIDE 20 MG PO TABS: 20.0000 mg | ORAL_TABLET | Freq: Every day | ORAL | Status: DC

## 2012-02-28 MED ORDER — ASPIRIN EC 81 MG PO TBEC: 81.0000 mg | DELAYED_RELEASE_TABLET | Freq: Every day | ORAL | Status: AC

## 2012-02-28 MED ORDER — METFORMIN HCL 1000 MG PO TABS: 1000.0000 mg | ORAL_TABLET | Freq: Two times a day (BID) | ORAL | Status: DC

## 2012-02-28 MED ORDER — GLUCOSE BLOOD VI STRP: ORAL_STRIP | Status: DC

## 2012-02-28 MED ORDER — ATORVASTATIN CALCIUM 40 MG PO TABS: 40.0000 mg | ORAL_TABLET | Freq: Every day | ORAL | Status: DC

## 2012-02-28 MED ORDER — ALLOPURINOL 300 MG PO TABS: 300.0000 mg | ORAL_TABLET | Freq: Every day | ORAL | Status: DC

## 2012-02-28 MED ORDER — GLIMEPIRIDE 4 MG PO TABS: 4.0000 mg | ORAL_TABLET | Freq: Every day | ORAL | Status: DC

## 2012-02-28 MED ORDER — AMLODIPINE BESYLATE 10 MG PO TABS: 10.0000 mg | ORAL_TABLET | Freq: Every day | ORAL | Status: DC

## 2012-02-28 MED ORDER — RAMIPRIL 10 MG PO CAPS: 10.0000 mg | ORAL_CAPSULE | Freq: Every day | ORAL | Status: DC

## 2012-02-28 MED ORDER — GLUCOSE BLOOD VI STRP: 1.0000 | ORAL_STRIP | Freq: Every day | Status: DC

## 2012-02-28 MED ORDER — FREESTYLE LANCETS MISC: 1.0000 | Freq: Every day | Status: DC

## 2012-02-28 NOTE — Telephone Encounter (Signed)
[facility] requesting script on Lancets (FREESTYLE) lancets

## 2012-02-28 NOTE — Progress Notes (Signed)
Quick Note:  Attempt to call- hm _ VM - LMTCB if question s- gave results and dr. Vernon Wilcox instructions.  Tried to call cell# - invalid number ______

## 2012-02-28 NOTE — Progress Notes (Signed)
  Subjective:    Patient ID: Patrick Wilcox, male    DOB: 21-Mar-1946, 66 y.o.   MRN: [id number]  HPI  66 year old patient who has treated diabetes hypertension and dyslipidemia. He has coronary artery disease and is status post CABG. Denies any exertional chest pain. His last hemoglobin A1c 6.9. He does monitor home blood pressure readings which have been well-controlled. However he has been out of several medications more recently. Blood pressure here today slightly elevated. He has a history of gout which has been stable. No new concerns or complaints  Wt Readings from Last 3 Encounters:  02/28/12 221 lb (100.245 kg)  11/17/11 215 lb (97.523 kg)  09/19/11 215 lb (97.523 kg)       Review of Systems  Constitutional: Negative for fever, chills, appetite change and fatigue.  HENT: Negative for hearing loss, ear pain, congestion, sore throat, trouble swallowing, neck stiffness, dental problem, voice change and tinnitus.   Eyes: Negative for pain, discharge and visual disturbance.  Respiratory: Negative for cough, chest tightness, wheezing and stridor.   Cardiovascular: Negative for chest pain, palpitations and leg swelling.  Gastrointestinal: Negative for nausea, vomiting, abdominal pain, diarrhea, constipation, blood in stool and abdominal distention.  Genitourinary: Negative for urgency, hematuria, flank pain, discharge, difficulty urinating and genital sores.  Musculoskeletal: Negative for myalgias, back pain, joint swelling, arthralgias and gait problem.  Skin: Negative for rash.  Neurological: Negative for dizziness, syncope, speech difficulty, weakness, numbness and headaches.  Hematological: Negative for adenopathy. Does not bruise/bleed easily.  Psychiatric/Behavioral: Negative for behavioral problems and dysphoric mood. The patient is not nervous/anxious.        Objective:   Physical Exam  Constitutional: He is oriented to person, place, and time. He appears well-developed.    HENT:  Head: Normocephalic.  Right Ear: External ear normal.  Left Ear: External ear normal.  Eyes: Conjunctivae and EOM are normal.  Neck: Normal range of motion.  Cardiovascular: Normal rate and normal heart sounds.        Status post sternotomy  Pulmonary/Chest: Breath sounds normal.  Abdominal: Bowel sounds are normal.  Musculoskeletal: Normal range of motion. He exhibits no edema and no tenderness.  Neurological: He is alert and oriented to person, place, and time.  Psychiatric: He has a normal mood and affect. His behavior is normal.          Assessment & Plan:   Diabetes. We'll continue present regimen. We'll check a hemoglobin A1c. There's been 6 pounds of weight gain lifestyle issues discussed and encouraged hypertension.  Compliance with medications discussed. All medications refilled Dyslipidemia stable Coronary artery disease stable History of gout stable

## 2012-02-28 NOTE — Telephone Encounter (Signed)
A new one was sent via computer with corrections

## 2012-02-28 NOTE — Telephone Encounter (Signed)
Pt needs prior auto on VARDENAFIL HCL 20 MG PO TABS

## 2012-02-28 NOTE — Patient Instructions (Signed)
Limit your sodium (Salt) intake  You need to lose weight.  Consider a lower calorie diet and regular exercise.    It is important that you exercise regularly, at least 20 minutes 3 to 4 times per week.  If you develop chest pain or shortness of breath seek  medical attention.  Please check your blood pressure on a regular basis.  If it is consistently greater than 150/90, please make an office appointment.   Please check your hemoglobin A1c every 3 months

## 2012-03-13 ENCOUNTER — Telehealth: Payer: Self-pay | Admitting: Family Medicine

## 2012-03-13 NOTE — Telephone Encounter (Signed)
Please advise 

## 2012-03-13 NOTE — Telephone Encounter (Signed)
Patient must pay out of pocket if desired

## 2012-03-13 NOTE — Telephone Encounter (Signed)
Prior auth for pt's Levitra denied. His Medicare Part D insurance plan does not cover ED meds. Please advise. Thank you.

## 2012-04-04 ENCOUNTER — Ambulatory Visit (INDEPENDENT_AMBULATORY_CARE_PROVIDER_SITE_OTHER): Payer: Medicare Other | Admitting: Internal Medicine

## 2012-04-04 ENCOUNTER — Encounter: Payer: Self-pay | Admitting: Internal Medicine

## 2012-04-04 ENCOUNTER — Telehealth: Payer: Self-pay | Admitting: Internal Medicine

## 2012-04-04 VITALS — BP 156/84 | Temp 98.6°F | Wt 222.0 lb

## 2012-04-04 DIAGNOSIS — J069 Acute upper respiratory infection, unspecified: Secondary | ICD-10-CM

## 2012-04-04 DIAGNOSIS — I10 Essential (primary) hypertension: Secondary | ICD-10-CM

## 2012-04-04 MED ORDER — FUROSEMIDE 20 MG PO TABS: 20.0000 mg | ORAL_TABLET | Freq: Every day | ORAL | Status: DC

## 2012-04-04 NOTE — Telephone Encounter (Signed)
Please advise - rx to call out? Need to be seen by another provider today? You tomorrow?

## 2012-04-04 NOTE — Patient Instructions (Addendum)
Use saline nose spray 3-4 times per day as directed Use Mucinex DM twice daily as needed for cough Use tylenol / acetaminophen 650 mg every 8 hours as needed for aches and pains Please call our office if your symptoms do not improve or gets worse.

## 2012-04-04 NOTE — Telephone Encounter (Signed)
Please assist - needs to be seen

## 2012-04-04 NOTE — Telephone Encounter (Signed)
Dr. Artist Pais 2:20 - pt aware.

## 2012-04-04 NOTE — Telephone Encounter (Signed)
Caller: Maxum/Patient; Patient Name: Patrick Wilcox; PCP: Eleonore Chiquito [facility]); Best Callback Phone Number: [phone]; Reason for call: Cough/Congestion.  Called re cold and cough symptoms.  Onset: 04/03/12.  Temperature/slight- not tested.  FBS not tested 04/04/12; was 140 04/03/12. Productive cough with dark phlem.  Chest painful during cough.  Advised to see MD now for new or worsening breathing problems per Diabetes Respiratory Problems guideline.   No appointments remain with Dr Amador Cunas for 04/04/12.  Information noted and sent to office for call back with appointment time.

## 2012-04-04 NOTE — Telephone Encounter (Signed)
rov today (if available with another provider)  or tomarrow

## 2012-04-04 NOTE — Assessment & Plan Note (Signed)
66 year old [place] American male with signs and symptoms of viral upper respiratory infection. I recommended symptomatic treatment. Patient advised to call office if symptoms persist or worsen.

## 2012-04-04 NOTE — Progress Notes (Signed)
Subjective:    Patient ID: Patrick Wilcox, male    DOB: 1945/11/01, 66 y.o.   MRN: [id number]  HPI  66 year old African American male with history of type 2 diabetes, coronary artery disease and hypertension complains of URI symptoms for 1 week. He reports visiting his brother last Tuesday and he thinks brother had the flu. 2 days after visiting his brother, he developed nasal congestion and productive cough. He denies shortness of breath. No chills, question mild low-grade fever.  Review of Systems Negative for shortness of breath  Past Medical History  Diagnosis Date  . CORONARY ARTERY DISEASE 11/23/2006  . DIABETES MELLITUS, TYPE II 11/23/2006  . GOUT 05/20/2008  . HYPERLIPIDEMIA 11/23/2006  . HYPERTENSION 11/23/2006  . 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 PNEUMONIA, LEFT LOWER LOBE 09/08/2009    History   Social History  . Marital Status: Single    Spouse Name: N/A    Number of Children: N/A  . Years of Education: N/A   Occupational History  . Not on file.   Social History Main Topics  . Smoking status: Never Smoker   . Smokeless tobacco: Never Used  . Alcohol Use: No  . Drug Use: Not on file  . Sexually Active: Not on file   Other Topics Concern  . Not on file   Social History Narrative  . No narrative on file    Past Surgical History  Procedure Date  . Coronary angioplasty with stent placement   . Coronary artery bypass graft 2003    Family History  Problem Relation Age of Onset  . Heart disease Mother   . Heart disease Father   . Diabetes Sister   . Diabetes Brother     No Known Allergies  Current Outpatient Prescriptions on File Prior to Visit  Medication Sig Dispense Refill  . allopurinol (ZYLOPRIM) 300 MG tablet Take 1 tablet (300 mg total) by mouth daily.  90 tablet  3  . amLODipine (NORVASC) 10 MG tablet Take 1 tablet (10 mg total) by mouth daily.  90 tablet  6  . aspirin EC 81 MG tablet Take 1 tablet (81 mg total) by mouth daily.      Marland Kitchen  atorvastatin (LIPITOR) 40 MG tablet Take 1 tablet (40 mg total) by mouth daily.  90 tablet  5  . colchicine (COLCRYS) 0.6 MG tablet Take 1 tablet (0.6 mg total) by mouth daily.  90 tablet  6  . glimepiride (AMARYL) 4 MG tablet Take 1 tablet (4 mg total) by mouth daily before breakfast.  180 tablet  3  . glucose blood (FREESTYLE TEST STRIPS) test strip 1 each by Other route daily. Use as instructed  100 each  6  . Lancets (FREESTYLE) lancets 1 each by Other route daily.  100 each  3  . metFORMIN (GLUCOPHAGE) 1000 MG tablet Take 1 tablet (1,000 mg total) by mouth 2 (two) times daily with a meal.  180 tablet  3  . metoprolol (LOPRESSOR) 100 MG tablet Take 1 tablet (100 mg total) by mouth 2 (two) times daily.  180 tablet  5  . ramipril (ALTACE) 10 MG capsule Take 1 capsule (10 mg total) by mouth daily.  90 capsule  5  . sitaGLIPtin (JANUVIA) 100 MG tablet Take 1 tablet (100 mg total) by mouth daily.  90 tablet  6  . vardenafil (LEVITRA) 20 MG tablet Take 20 mg by mouth daily as needed.      Marland Kitchen DISCONTD: furosemide (LASIX) 20  MG tablet Take 1 tablet (20 mg total) by mouth daily.  30 tablet  11    BP 156/84  Temp 98.6 F (37 C) (Oral)  Wt 222 lb (100.699 kg)       Objective:   Physical Exam  Constitutional: He is oriented to person, place, and time. He appears well-developed and well-nourished.  HENT:  Head: Normocephalic and atraumatic.  Right Ear: External ear normal.  Mouth/Throat: No oropharyngeal exudate.       Mild oropharyngeal erythema  Neck: Neck supple.  Cardiovascular: Normal rate, regular rhythm and normal heart sounds.   Pulmonary/Chest: Effort normal and breath sounds normal. He has no wheezes.       No dullness to percussion  Lymphadenopathy:    He has no cervical adenopathy.  Neurological: He is alert and oriented to person, place, and time.  Skin: Skin is warm and dry.  Psychiatric: He has a normal mood and affect. His behavior is normal.          Assessment &  Plan:

## 2012-04-04 NOTE — Telephone Encounter (Signed)
Patrick Wilcox, please advise. Happy to set up appt if need be. Thanks.

## 2012-05-31 ENCOUNTER — Encounter: Payer: Self-pay | Admitting: Internal Medicine

## 2012-05-31 ENCOUNTER — Ambulatory Visit (INDEPENDENT_AMBULATORY_CARE_PROVIDER_SITE_OTHER): Payer: Medicare Other | Admitting: Internal Medicine

## 2012-05-31 VITALS — BP 160/90 | HR 88 | Temp 98.2°F | Resp 18 | Wt 213.0 lb

## 2012-05-31 DIAGNOSIS — E785 Hyperlipidemia, unspecified: Secondary | ICD-10-CM

## 2012-05-31 DIAGNOSIS — I2510 Atherosclerotic heart disease of native coronary artery without angina pectoris: Secondary | ICD-10-CM

## 2012-05-31 DIAGNOSIS — Z23 Encounter for immunization: Secondary | ICD-10-CM

## 2012-05-31 DIAGNOSIS — M109 Gout, unspecified: Secondary | ICD-10-CM

## 2012-05-31 DIAGNOSIS — I10 Essential (primary) hypertension: Secondary | ICD-10-CM

## 2012-05-31 DIAGNOSIS — E119 Type 2 diabetes mellitus without complications: Secondary | ICD-10-CM

## 2012-05-31 LAB — HEMOGLOBIN A1C: Hgb A1c MFr Bld: 10.3 % — ABNORMAL HIGH (ref 4.6–6.5)

## 2012-05-31 MED ORDER — GLIMEPIRIDE 4 MG PO TABS: 4.0000 mg | ORAL_TABLET | Freq: Every day | ORAL | Status: DC

## 2012-05-31 MED ORDER — RAMIPRIL 10 MG PO CAPS: 10.0000 mg | ORAL_CAPSULE | Freq: Every day | ORAL | Status: DC

## 2012-05-31 MED ORDER — FUROSEMIDE 20 MG PO TABS: 20.0000 mg | ORAL_TABLET | Freq: Every day | ORAL | Status: DC

## 2012-05-31 MED ORDER — METOPROLOL TARTRATE 100 MG PO TABS: 100.0000 mg | ORAL_TABLET | Freq: Two times a day (BID) | ORAL | Status: DC

## 2012-05-31 MED ORDER — ALLOPURINOL 300 MG PO TABS: 300.0000 mg | ORAL_TABLET | Freq: Every day | ORAL | Status: DC

## 2012-05-31 MED ORDER — AMLODIPINE BESYLATE 10 MG PO TABS: 10.0000 mg | ORAL_TABLET | Freq: Every day | ORAL | Status: DC

## 2012-05-31 MED ORDER — SITAGLIPTIN PHOSPHATE 100 MG PO TABS: 100.0000 mg | ORAL_TABLET | Freq: Every day | ORAL | Status: DC

## 2012-05-31 MED ORDER — ATORVASTATIN CALCIUM 40 MG PO TABS: 40.0000 mg | ORAL_TABLET | Freq: Every day | ORAL | Status: DC

## 2012-05-31 MED ORDER — METFORMIN HCL 1000 MG PO TABS: 1000.0000 mg | ORAL_TABLET | Freq: Two times a day (BID) | ORAL | Status: DC

## 2012-05-31 MED ORDER — COLCHICINE 0.6 MG PO TABS: 0.6000 mg | ORAL_TABLET | Freq: Every day | ORAL | Status: DC

## 2012-05-31 NOTE — Patient Instructions (Signed)
Limit your sodium (Salt) intake  You need to lose weight.  Consider a lower calorie diet and regular exercise.   Please check your hemoglobin A1c every 3 months   

## 2012-05-31 NOTE — Progress Notes (Signed)
Subjective:    Patient ID: Patrick Wilcox, male    DOB: 05/10/1946, 66 y.o.   MRN: [id number]  HPI  66 year old patient who is seen today for followup. He has type 2 diabetes. He has hypertension coronary artery disease and dyslipidemia. He states that he has been out of multiple medications. No cardiopulmonary complaints.  Since his last visit here there's been a nice weight loss of 9 pounds  Past Medical History  Diagnosis Date  . CORONARY ARTERY DISEASE 11/23/2006  . DIABETES MELLITUS, TYPE II 11/23/2006  . GOUT 05/20/2008  . HYPERLIPIDEMIA 11/23/2006  . HYPERTENSION 11/23/2006  . 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 PNEUMONIA, LEFT LOWER LOBE 09/08/2009    History   Social History  . Marital Status: Single    Spouse Name: N/A    Number of Children: N/A  . Years of Education: N/A   Occupational History  . Not on file.   Social History Main Topics  . Smoking status: Never Smoker   . Smokeless tobacco: Never Used  . Alcohol Use: No  . Drug Use: Not on file  . Sexually Active: Not on file   Other Topics Concern  . Not on file   Social History Narrative  . No narrative on file    Past Surgical History  Procedure Date  . Coronary angioplasty with stent placement   . Coronary artery bypass graft 2003    Family History  Problem Relation Age of Onset  . Heart disease Mother   . Heart disease Father   . Diabetes Sister   . Diabetes Brother     No Known Allergies  Current Outpatient Prescriptions on File Prior to Visit  Medication Sig Dispense Refill  . allopurinol (ZYLOPRIM) 300 MG tablet Take 1 tablet (300 mg total) by mouth daily.  90 tablet  3  . amLODipine (NORVASC) 10 MG tablet Take 1 tablet (10 mg total) by mouth daily.  90 tablet  6  . aspirin EC 81 MG tablet Take 1 tablet (81 mg total) by mouth daily.      Marland Kitchen atorvastatin (LIPITOR) 40 MG tablet Take 1 tablet (40 mg total) by mouth daily.  90 tablet  5  . colchicine (COLCRYS) 0.6 MG tablet Take 1 tablet  (0.6 mg total) by mouth daily.  90 tablet  6  . furosemide (LASIX) 20 MG tablet Take 1 tablet (20 mg total) by mouth daily.  30 tablet  11  . glimepiride (AMARYL) 4 MG tablet Take 1 tablet (4 mg total) by mouth daily before breakfast.  180 tablet  3  . glucose blood (FREESTYLE TEST STRIPS) test strip 1 each by Other route daily. Use as instructed  100 each  6  . Lancets (FREESTYLE) lancets 1 each by Other route daily.  100 each  3  . metFORMIN (GLUCOPHAGE) 1000 MG tablet Take 1 tablet (1,000 mg total) by mouth 2 (two) times daily with a meal.  180 tablet  3  . metoprolol (LOPRESSOR) 100 MG tablet Take 1 tablet (100 mg total) by mouth 2 (two) times daily.  180 tablet  5  . ramipril (ALTACE) 10 MG capsule Take 1 capsule (10 mg total) by mouth daily.  90 capsule  5  . sitaGLIPtin (JANUVIA) 100 MG tablet Take 1 tablet (100 mg total) by mouth daily.  90 tablet  6  . vardenafil (LEVITRA) 20 MG tablet Take 20 mg by mouth daily as needed.        BP 160/90  Pulse 88  Temp 98.2 F (36.8 C) (Oral)  Resp 18  Wt 213 lb (96.616 kg)  SpO2 97%      Review of Systems  Constitutional: Negative for fever, chills, appetite change and fatigue.  HENT: Negative for hearing loss, ear pain, congestion, sore throat, trouble swallowing, neck stiffness, dental problem, voice change and tinnitus.   Eyes: Negative for pain, discharge and visual disturbance.  Respiratory: Negative for cough, chest tightness, wheezing and stridor.   Cardiovascular: Negative for chest pain, palpitations and leg swelling.  Gastrointestinal: Negative for nausea, vomiting, abdominal pain, diarrhea, constipation, blood in stool and abdominal distention.  Genitourinary: Negative for urgency, hematuria, flank pain, discharge, difficulty urinating and genital sores.  Musculoskeletal: Negative for myalgias, back pain, joint swelling, arthralgias and gait problem.  Skin: Negative for rash.  Neurological: Negative for dizziness, syncope,  speech difficulty, weakness, numbness and headaches.  Hematological: Negative for adenopathy. Does not bruise/bleed easily.  Psychiatric/Behavioral: Negative for behavioral problems and dysphoric mood. The patient is not nervous/anxious.        Objective:   Physical Exam  Constitutional:       Blood pressure 160/90          Assessment & Plan:  Hypertension. Poor control secondary to noncompliance. All medications refilled Diabetes mellitus. Will check a hemoglobin A1c Dyslipidemia will refill atorvastatin Coronary artery disease stable  Recheck 3 months

## 2012-05-31 NOTE — Progress Notes (Signed)
  Subjective:    Patient ID: Patrick Wilcox, male    DOB: Jun 04, 1946, 66 y.o.   MRN: [id number]  HPI  Wt Readings from Last 3 Encounters:  05/31/12 213 lb (96.616 kg)  04/04/12 222 lb (100.699 kg)  02/28/12 221 lb (100.245 kg)    Review of Systems     Objective:   Physical Exam        Assessment & Plan:

## 2012-08-15 ENCOUNTER — Encounter: Payer: Self-pay | Admitting: Family Medicine

## 2012-08-15 ENCOUNTER — Ambulatory Visit (INDEPENDENT_AMBULATORY_CARE_PROVIDER_SITE_OTHER): Payer: Medicare Other | Admitting: Family Medicine

## 2012-08-15 VITALS — BP 128/86 | HR 86 | Temp 97.7°F | Wt 196.0 lb

## 2012-08-15 DIAGNOSIS — E119 Type 2 diabetes mellitus without complications: Secondary | ICD-10-CM

## 2012-08-15 MED ORDER — INSULIN GLARGINE 100 UNIT/ML ~~LOC~~ SOLN: 20.0000 [IU] | Freq: Every day | SUBCUTANEOUS | Status: DC

## 2012-08-15 MED ORDER — INSULIN ASPART PROT & ASPART (70-30 MIX) 100 UNIT/ML ~~LOC~~ SUSP: SUBCUTANEOUS | Status: DC

## 2012-08-15 NOTE — Progress Notes (Signed)
  Subjective:    Patient ID: Patrick Wilcox, male    DOB: [birth year], 67 y.o.   MRN: [id number]  HPI This is a patient of Dr. Amador Cunas whose diabetes seems to be getting worse. From his chart I see his A1c has risen from 6.9 to 8.0 to 10.3 in the past 6 months. He states that he takes his medications faithfully and watches his diet. He had not checked his glucoses however for a month or so, and he did so this am before he ate breakfast. This was 355 and it alarmed him. Now in our office the glucose is 433 after he ate a bowl of cereal and drank water. He feels fine in general. He is already on standard doses of Glimeperide, Metformin, and Januvia.    Review of Systems  Constitutional: Negative.   Respiratory: Negative.   Cardiovascular: Negative.   Neurological: Negative.        Objective:   Physical Exam  Constitutional: He is oriented to person, place, and time. He appears well-developed and well-nourished. No distress.  Cardiovascular: Normal rate, regular rhythm, normal heart sounds and intact distal pulses.   Pulmonary/Chest: Effort normal and breath sounds normal.  Neurological: He is alert and oriented to person, place, and time.          Assessment & Plan:  His diabetes has progressed to the point where he now requires insulin therapy. I wrote for him to start on Lantus and Novolog 70/30 mix, and he will pick these up at the pharmacy today. We will arrange for him to meet with a diabetic educator ASAP to teach him how to use these. Once he starts on these products, I told him to stop all three of the oral meds above. He will follow up with Dr. Amador Cunas in 2 weeks.

## 2012-08-21 ENCOUNTER — Encounter: Payer: Self-pay | Admitting: Dietician

## 2012-08-21 ENCOUNTER — Encounter: Payer: Medicare Other | Attending: Family Medicine | Admitting: Dietician

## 2012-08-21 VITALS — Ht 72.0 in | Wt 199.4 lb

## 2012-08-21 DIAGNOSIS — E119 Type 2 diabetes mellitus without complications: Secondary | ICD-10-CM | POA: Insufficient documentation

## 2012-08-21 DIAGNOSIS — Z713 Dietary counseling and surveillance: Secondary | ICD-10-CM | POA: Insufficient documentation

## 2012-08-21 NOTE — Progress Notes (Signed)
  Medical Nutrition Therapy:  Appt start time: 0900 end time:  1000.   Assessment:  Primary concerns today: Wants to learn how to give himself insulin.  Has been on oral agents and his A1C has increased to 10.35  (Avg glucose of 240 mg/dl). He is to learn to give himself the Lantus and Novolog insulins and on starting insulin he is to discontinue his oral diabetes medications.  He comes today with his brother.  He has obtained his insulin from the pharmacy, but did not bring them with him today.    MEDICATIONS: Review of medications completed including discussing the curve of the insulins that he is to be taking.  BLOOD GLUCOSE MONITORING:  Currently monitoring a few times per week.  Unsure of his readings.  Stressed to him the need to check his glucose at least 2 times per day to assess the effectiveness of the insulin.  He is to record his glucose.  Recommended to start fasting daily and at one time during each day.   DIETARY INTAKE:  Usual eating pattern includes 2-3 meals and 1-2 snacks per day.  Everyday foods include starches, fruits, meats, and snacks.  Avoided foods include non noted.    24-hr recall:  B ( AM): 8-9:00 Cereal (cornflakes) 2 cup banana whole and milk (2%) 8 oz OR eggs 2 with bacon 3, grits, 1 pack, coffee, decaf, splenda or equal Snk ( AM): was having drinking many soda, diet or regular or lemonade.  L ( PM): tuna salad with crackers and occasionally cheeseburger, soda, sometimes Pringles. Snk ( PM): sip on fluids, sometimes chips D ( PM): 4-6:00 PM  BBQ, green beans and slaw and water Snk ( PM): sometimes potato chips and water Beverages: water, soda (regular and diet) lemonade  Usual physical activity: Walking 2.5 -5 miles, 3 times per week, slacked up with rain and bad weather.  Estimated energy needs:  Ht: 72 in  WT; 199 lb  BMI: 27.1 kg/m2  Adj WT:186 lb 1800 calories 200-205 g carbohydrates 130-135 g protein 48-50 g fat  Progress Towards Goal(s):  No  progress.   Nutritional Diagnosis:  [place]-2.1 Inpaired nutrition utilization As related to blood glucose.  As evidenced by history of type 2 diabetes with an A1C at 10.3% and needing to start insulin at this time.    Intervention:  Completed brief review of what type 2 diabetes is and the factors that help control blood glucose levels.  Reviewed the two insulins he would be taking and their curves.  Review of the care and storage of the insulin pens.  Review of the the steps for priming the needle, prepping the site, injecting the insulin.  Again emphasized the need to check blood glucose. Reviewed the S/S and treatment of low blood glucose.  Recommended that he purchase some glucose tablets for use when he walks or exercises.  Review of the proper disposal of pen needles and lancets.  Handouts given during visit include:  BD Insulin Start Kit  Lot:  [id number]  Exp: 2016/10/13  Living Well with Diabetes  Monitoring/Evaluation:  Dietary intake, exercise, blood glucose levels, and body weight in 8-12 weeks and in the meantime, if he has questions or issues, pleas call with questions.Marland Kitchen

## 2012-08-22 ENCOUNTER — Encounter: Payer: Self-pay | Admitting: Dietician

## 2012-08-27 ENCOUNTER — Telehealth: Payer: Self-pay | Admitting: Internal Medicine

## 2012-08-27 MED ORDER — INSULIN PEN NEEDLE 32G X 4 MM MISC: 1.0000 | Freq: Every day | Status: DC | PRN

## 2012-08-27 NOTE — Telephone Encounter (Signed)
Pt requesting rx refill of ultra fine III BD mini needles for insulin pen sent to [facility] (pt is completely out and needs today)

## 2012-08-27 NOTE — Telephone Encounter (Signed)
Left message on voicemail.

## 2012-08-28 NOTE — Telephone Encounter (Signed)
Spoke to pt stated picked needles up yesterday they are a little smaller uses 31 g not 32 g. Told pt if he needs me to send another Rx just let me know. Pt verbalized understanding, and said will try these. Told him okay.

## 2012-08-29 ENCOUNTER — Ambulatory Visit (INDEPENDENT_AMBULATORY_CARE_PROVIDER_SITE_OTHER): Payer: Medicare Other | Admitting: Internal Medicine

## 2012-08-29 ENCOUNTER — Encounter: Payer: Self-pay | Admitting: Internal Medicine

## 2012-08-29 VITALS — BP 160/90 | HR 81 | Temp 98.8°F | Resp 18 | Wt 203.0 lb

## 2012-08-29 DIAGNOSIS — E119 Type 2 diabetes mellitus without complications: Secondary | ICD-10-CM

## 2012-08-29 DIAGNOSIS — I10 Essential (primary) hypertension: Secondary | ICD-10-CM

## 2012-08-29 MED ORDER — METFORMIN HCL 1000 MG PO TABS: 1000.0000 mg | ORAL_TABLET | Freq: Two times a day (BID) | ORAL | Status: DC

## 2012-08-29 MED ORDER — INSULIN ASPART 100 UNIT/ML ~~LOC~~ SOLN: SUBCUTANEOUS | Status: DC

## 2012-08-29 MED ORDER — INSULIN GLARGINE 100 UNIT/ML ~~LOC~~ SOLN: SUBCUTANEOUS | Status: DC

## 2012-08-29 NOTE — Patient Instructions (Signed)
Increase Lantus to 26 units at bedtime Resume metformin therapy  Recheck 1 month

## 2012-08-29 NOTE — Progress Notes (Signed)
Subjective:    Patient ID: Patrick Wilcox, male    DOB: Feb 16, 1946, 67 y.o.   MRN: [id number]  HPI  67 year old patient who is seen today for followup of diabetes. He was last seen by me in November and hemoglobin A1c was 10 at that time. Blood sugars have become quite elevated off excess of 400. In my absence insulin therapy has been initiated and presently he is on Lantus 20 units at bedtime as well as NovoLog 10 units prior to breakfast and 15 units prior to his evening meal. Blood sugars have been trending down. All oral medications including metformin have been discontinued  Past Medical History  Diagnosis Date  . CORONARY ARTERY DISEASE 11/23/2006  . DIABETES MELLITUS, TYPE II 11/23/2006  . GOUT 05/20/2008  . HYPERLIPIDEMIA 11/23/2006  . HYPERTENSION 11/23/2006  . 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 PNEUMONIA, LEFT LOWER LOBE 09/08/2009    History   Social History  . Marital Status: Single    Spouse Name: N/A    Number of Children: N/A  . Years of Education: N/A   Occupational History  . Not on file.   Social History Main Topics  . Smoking status: Never Smoker   . Smokeless tobacco: Never Used  . Alcohol Use: No  . Drug Use: No  . Sexually Active: Not on file   Other Topics Concern  . Not on file   Social History Narrative  . No narrative on file    Past Surgical History  Procedure Laterality Date  . Coronary angioplasty with stent placement    . Coronary artery bypass graft  2003    Family History  Problem Relation Age of Onset  . Heart disease Mother   . Heart disease Father   . Diabetes Sister   . Diabetes Brother     No Known Allergies  Current Outpatient Prescriptions on File Prior to Visit  Medication Sig Dispense Refill  . allopurinol (ZYLOPRIM) 300 MG tablet Take 1 tablet (300 mg total) by mouth daily.  90 tablet  3  . amLODipine (NORVASC) 10 MG tablet Take 1 tablet (10 mg total) by mouth daily.  90 tablet  6  . aspirin EC 81 MG tablet Take 1  tablet (81 mg total) by mouth daily.      Marland Kitchen atorvastatin (LIPITOR) 40 MG tablet Take 1 tablet (40 mg total) by mouth daily.  90 tablet  5  . colchicine (COLCRYS) 0.6 MG tablet Take 1 tablet (0.6 mg total) by mouth daily.  90 tablet  6  . furosemide (LASIX) 20 MG tablet Take 1 tablet (20 mg total) by mouth daily.  30 tablet  11  . glucose blood (FREESTYLE TEST STRIPS) test strip 1 each by Other route daily. Use as instructed  100 each  6  . insulin aspart protamine-insulin aspart (NOVOLOG MIX 70/30 FLEXPEN) (70-30) 100 UNIT/ML injection Give 10 units with breakfast and 15 units with supper  15 mL  0  . insulin glargine (LANTUS SOLOSTAR) 100 UNIT/ML injection Inject 20 Units into the skin at bedtime.  5 pen  0  . Insulin Pen Needle 32G X 4 MM MISC 1 each by Does not apply route daily as needed.  100 each  3  . Lancets (FREESTYLE) lancets 1 each by Other route daily.  100 each  3  . metoprolol (LOPRESSOR) 100 MG tablet Take 1 tablet (100 mg total) by mouth 2 (two) times daily.  180 tablet  5  . ramipril (  ALTACE) 10 MG capsule Take 1 capsule (10 mg total) by mouth daily.  90 capsule  5  . vardenafil (LEVITRA) 20 MG tablet Take 20 mg by mouth daily as needed.       No current facility-administered medications on file prior to visit.    BP 160/90  Pulse 81  Temp(Src) 98.8 F (37.1 C) (Oral)  Resp 18  Wt 203 lb (92.08 kg)  BMI 27.53 kg/m2  SpO2 99%       Review of Systems  Constitutional: Positive for unexpected weight change. Negative for fever, chills, appetite change and fatigue.  HENT: Negative for hearing loss, ear pain, congestion, sore throat, trouble swallowing, neck stiffness, dental problem, voice change and tinnitus.   Eyes: Negative for pain, discharge and visual disturbance.  Respiratory: Negative for cough, chest tightness, wheezing and stridor.   Cardiovascular: Negative for chest pain, palpitations and leg swelling.  Gastrointestinal: Negative for nausea, vomiting,  abdominal pain, diarrhea, constipation, blood in stool and abdominal distention.  Genitourinary: Negative for urgency, hematuria, flank pain, discharge, difficulty urinating and genital sores.  Musculoskeletal: Negative for myalgias, back pain, joint swelling, arthralgias and gait problem.  Skin: Negative for rash.  Neurological: Negative for dizziness, syncope, speech difficulty, weakness, numbness and headaches.  Hematological: Negative for adenopathy. Does not bruise/bleed easily.  Psychiatric/Behavioral: Negative for behavioral problems and dysphoric mood. The patient is not nervous/anxious.        Objective:   Physical Exam  Constitutional: He appears well-developed and well-nourished. No distress.          Assessment & Plan:   Diabetes mellitus. Will increase Lantus to 26 units at bedtime. We'll continue present NovoLog regimen of 10 units prior to breakfast and 15 units prior to his evening meal. We'll resume the metformin therapy Hypertension stable  Recheck 1 month

## 2012-08-31 ENCOUNTER — Ambulatory Visit: Payer: Medicare Other | Admitting: Internal Medicine

## 2012-09-26 ENCOUNTER — Encounter: Payer: Self-pay | Admitting: Internal Medicine

## 2012-09-26 ENCOUNTER — Ambulatory Visit (INDEPENDENT_AMBULATORY_CARE_PROVIDER_SITE_OTHER): Payer: Medicare Other | Admitting: Internal Medicine

## 2012-09-26 VITALS — BP 140/84 | HR 90 | Temp 98.2°F | Resp 20 | Wt 200.0 lb

## 2012-09-26 DIAGNOSIS — I10 Essential (primary) hypertension: Secondary | ICD-10-CM

## 2012-09-26 DIAGNOSIS — E119 Type 2 diabetes mellitus without complications: Secondary | ICD-10-CM

## 2012-09-26 MED ORDER — INSULIN GLARGINE 100 UNIT/ML ~~LOC~~ SOLN: SUBCUTANEOUS | Status: DC

## 2012-09-26 MED ORDER — INSULIN GLARGINE 100 UNIT/ML ~~LOC~~ SOLN: 30.0000 [IU] | Freq: Every day | SUBCUTANEOUS | Status: DC

## 2012-09-26 NOTE — Patient Instructions (Signed)
Increase Lantus insulin to 30 units at bedtime Increase Lantus insulin by 4 units every week until fasting blood sugar prior to breakfast is consistently less than 150  Continue present mealtime dosing of NovoLog 10 units prior to breakfast and 15 units prior to dinner;  may take 10 units prior to lunch

## 2012-09-26 NOTE — Progress Notes (Signed)
Subjective:    Patient ID: Patrick Wilcox, male    DOB: Dec 05, 1945, 67 y.o.   MRN: [id number]  HPI  68 year old patient who is seen today for followup of diabetes and hypertension. At the present time he is on Lantus insulin 26 units at bedtime as well as NovoLog 10 units prior to breakfast and 15 units prior to his evening meal. Prior to initiating insulin therapy blood sugars were consistently greater than 400. Blood sugars presently remains between 202 35. He feels well without any hypoglycemia. He has resumed metformin. He has treated hypertension which has been stable.  Past Medical History  Diagnosis Date  . CORONARY ARTERY DISEASE 11/23/2006  . DIABETES MELLITUS, TYPE II 11/23/2006  . GOUT 05/20/2008  . HYPERLIPIDEMIA 11/23/2006  . HYPERTENSION 11/23/2006  . 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 PNEUMONIA, LEFT LOWER LOBE 09/08/2009    History   Social History  . Marital Status: Single    Spouse Name: N/A    Number of Children: N/A  . Years of Education: N/A   Occupational History  . Not on file.   Social History Main Topics  . Smoking status: Never Smoker   . Smokeless tobacco: Never Used  . Alcohol Use: No  . Drug Use: No  . Sexually Active: Not on file   Other Topics Concern  . Not on file   Social History Narrative  . No narrative on file    Past Surgical History  Procedure Laterality Date  . Coronary angioplasty with stent placement    . Coronary artery bypass graft  2003    Family History  Problem Relation Age of Onset  . Heart disease Mother   . Heart disease Father   . Diabetes Sister   . Diabetes Brother     No Known Allergies  Current Outpatient Prescriptions on File Prior to Visit  Medication Sig Dispense Refill  . allopurinol (ZYLOPRIM) 300 MG tablet Take 1 tablet (300 mg total) by mouth daily.  90 tablet  3  . amLODipine (NORVASC) 10 MG tablet Take 1 tablet (10 mg total) by mouth daily.  90 tablet  6  . aspirin EC 81 MG tablet Take 1  tablet (81 mg total) by mouth daily.      Marland Kitchen atorvastatin (LIPITOR) 40 MG tablet Take 1 tablet (40 mg total) by mouth daily.  90 tablet  5  . colchicine (COLCRYS) 0.6 MG tablet Take 1 tablet (0.6 mg total) by mouth daily.  90 tablet  6  . furosemide (LASIX) 20 MG tablet Take 1 tablet (20 mg total) by mouth daily.  30 tablet  11  . glucose blood (FREESTYLE TEST STRIPS) test strip 1 each by Other route daily. Use as instructed  100 each  6  . insulin aspart (NOVOLOG) 100 UNIT/ML injection 10 units prior to breakfast 15 units prior to supper  3 vial  PRN  . insulin glargine (LANTUS SOLOSTAR) 100 UNIT/ML injection 26 units at bedtime  5 pen  0  . Insulin Pen Needle 32G X 4 MM MISC 1 each by Does not apply route daily as needed.  100 each  3  . Lancets (FREESTYLE) lancets 1 each by Other route daily.  100 each  3  . metFORMIN (GLUCOPHAGE) 1000 MG tablet Take 1 tablet (1,000 mg total) by mouth 2 (two) times daily with a meal.  180 tablet  3  . metoprolol (LOPRESSOR) 100 MG tablet Take 1 tablet (100 mg total) by mouth 2 (  two) times daily.  180 tablet  5  . ramipril (ALTACE) 10 MG capsule Take 1 capsule (10 mg total) by mouth daily.  90 capsule  5  . vardenafil (LEVITRA) 20 MG tablet Take 20 mg by mouth daily as needed.       No current facility-administered medications on file prior to visit.    BP 140/84  Pulse 90  Temp(Src) 98.2 F (36.8 C) (Oral)  Resp 20  Wt 200 lb (90.719 kg)  BMI 27.12 kg/m2  SpO2 98%       Review of Systems  Constitutional: Negative for fever, chills, appetite change and fatigue.  HENT: Negative for hearing loss, ear pain, congestion, sore throat, trouble swallowing, neck stiffness, dental problem, voice change and tinnitus.   Eyes: Negative for pain, discharge and visual disturbance.  Respiratory: Negative for cough, chest tightness, wheezing and stridor.   Cardiovascular: Negative for chest pain, palpitations and leg swelling.  Gastrointestinal: Negative for  nausea, vomiting, abdominal pain, diarrhea, constipation, blood in stool and abdominal distention.  Genitourinary: Negative for urgency, hematuria, flank pain, discharge, difficulty urinating and genital sores.  Musculoskeletal: Negative for myalgias, back pain, joint swelling, arthralgias and gait problem.  Skin: Negative for rash.  Neurological: Negative for dizziness, syncope, speech difficulty, weakness, numbness and headaches.  Hematological: Negative for adenopathy. Does not bruise/bleed easily.  Psychiatric/Behavioral: Negative for behavioral problems and dysphoric mood. The patient is not nervous/anxious.        Objective:   Physical Exam  Constitutional: He appears well-developed and well-nourished. No distress.  Blood pressure 130/80          Assessment & Plan:   Diabetes mellitus. Will increase Lantus insulin to 30 units at bedtime. Will titrate further weekly by 4 units until fasting blood sugars consistently less than 150. We'll continue present mealtime dosing of NovoLog Hypertension stable

## 2012-10-08 ENCOUNTER — Other Ambulatory Visit: Payer: Self-pay | Admitting: Family Medicine

## 2012-10-12 ENCOUNTER — Other Ambulatory Visit: Payer: Self-pay | Admitting: *Deleted

## 2012-10-12 MED ORDER — INSULIN LISPRO PROT & LISPRO (75-25 MIX) 100 UNIT/ML ~~LOC~~ SUSP: SUBCUTANEOUS | Status: DC

## 2012-10-12 NOTE — Telephone Encounter (Signed)
Spoke to pt told him had to change  Insulin Novolog to Humalog Mix 75/25 Kwikpen same directions as other insulin due to insurance would not cover. Rx sent to pharmacy. Pt verbalized understanding.

## 2012-10-31 ENCOUNTER — Ambulatory Visit (INDEPENDENT_AMBULATORY_CARE_PROVIDER_SITE_OTHER): Payer: Medicare Other | Admitting: Internal Medicine

## 2012-10-31 ENCOUNTER — Encounter: Payer: Self-pay | Admitting: Internal Medicine

## 2012-10-31 VITALS — BP 150/70 | HR 80 | Temp 98.1°F | Resp 20 | Wt 203.0 lb

## 2012-10-31 DIAGNOSIS — I10 Essential (primary) hypertension: Secondary | ICD-10-CM

## 2012-10-31 DIAGNOSIS — E119 Type 2 diabetes mellitus without complications: Secondary | ICD-10-CM

## 2012-10-31 MED ORDER — INSULIN ASPART 100 UNIT/ML ~~LOC~~ SOLN: SUBCUTANEOUS | Status: DC

## 2012-10-31 NOTE — Patient Instructions (Signed)
Limit your sodium (Salt) intake    It is important that you exercise regularly, at least 20 minutes 3 to 4 times per week.  If you develop chest pain or shortness of breath seek  medical attention.   Please check your hemoglobin A1c every 3 months  You need to lose weight.  Consider a lower calorie diet and regular exercise.   

## 2012-10-31 NOTE — Progress Notes (Signed)
  Subjective:    Patient ID: Patrick Wilcox, male    DOB: Jan 13, 1946, 67 y.o.   MRN: [id number]  HPI  Wt Readings from Last 3 Encounters:  10/31/12 203 lb (92.08 kg)  09/26/12 200 lb (90.719 kg)  08/29/12 203 lb (92.08 kg)    Review of Systems     Objective:   Physical Exam        Assessment & Plan:

## 2012-10-31 NOTE — Progress Notes (Signed)
Subjective:    Patient ID: Patrick Wilcox, male    DOB: 23-Jan-1946, 67 y.o.   MRN: [id number]  HPI  67 year old patient who is seen today for followup of hypertension and diabetes. He's on basal and bolus insulin and doing quite well. Fasting blood sugar today 96 he has been using Lantus 26 units at bedtime. He is on the mealtime insulin twice daily with some modest sliding scale coverage. No hypoglycemia. He has coronary artery disease which has been stable.   Past Medical History  Diagnosis Date  . CORONARY ARTERY DISEASE 11/23/2006  . DIABETES MELLITUS, TYPE II 11/23/2006  . GOUT 05/20/2008  . HYPERLIPIDEMIA 11/23/2006  . HYPERTENSION 11/23/2006  . 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 PNEUMONIA, LEFT LOWER LOBE 09/08/2009    History   Social History  . Marital Status: Single    Spouse Name: N/A    Number of Children: N/A  . Years of Education: N/A   Occupational History  . Not on file.   Social History Main Topics  . Smoking status: Never Smoker   . Smokeless tobacco: Never Used  . Alcohol Use: No  . Drug Use: No  . Sexually Active: Not on file   Other Topics Concern  . Not on file   Social History Narrative  . No narrative on file    Past Surgical History  Procedure Laterality Date  . Coronary angioplasty with stent placement    . Coronary artery bypass graft  2003    Family History  Problem Relation Age of Onset  . Heart disease Mother   . Heart disease Father   . Diabetes Sister   . Diabetes Brother     No Known Allergies  Current Outpatient Prescriptions on File Prior to Visit  Medication Sig Dispense Refill  . allopurinol (ZYLOPRIM) 300 MG tablet Take 1 tablet (300 mg total) by mouth daily.  90 tablet  3  . amLODipine (NORVASC) 10 MG tablet Take 1 tablet (10 mg total) by mouth daily.  90 tablet  6  . aspirin EC 81 MG tablet Take 1 tablet (81 mg total) by mouth daily.      Marland Kitchen atorvastatin (LIPITOR) 40 MG tablet Take 1 tablet (40 mg total) by mouth  daily.  90 tablet  5  . colchicine (COLCRYS) 0.6 MG tablet Take 1 tablet (0.6 mg total) by mouth daily.  90 tablet  6  . furosemide (LASIX) 20 MG tablet Take 1 tablet (20 mg total) by mouth daily.  30 tablet  11  . glucose blood (FREESTYLE TEST STRIPS) test strip 1 each by Other route daily. Use as instructed  100 each  6  . insulin lispro protamine-lispro (HUMALOG MIX 75/25 KWIKPEN) (75-25) 100 UNIT/ML SUSP Inject 10 units subcutaneously with breakfast and 15 units with supper.  10 mL  3  . Insulin Pen Needle 32G X 4 MM MISC 1 each by Does not apply route daily as needed.  100 each  3  . Lancets (FREESTYLE) lancets 1 each by Other route daily.  100 each  3  . metFORMIN (GLUCOPHAGE) 1000 MG tablet Take 1 tablet (1,000 mg total) by mouth 2 (two) times daily with a meal.  180 tablet  3  . metoprolol (LOPRESSOR) 100 MG tablet Take 1 tablet (100 mg total) by mouth 2 (two) times daily.  180 tablet  5  . NOVOLOG MIX 70/30 FLEXPEN (70-30) 100 UNIT/ML injection INJECT 10 UNITS WITH BREAKFAST AND 15 UNITS WITH SUPPER  15 mL  2  . ramipril (ALTACE) 10 MG capsule Take 1 capsule (10 mg total) by mouth daily.  90 capsule  5  . vardenafil (LEVITRA) 20 MG tablet Take 20 mg by mouth daily as needed.       No current facility-administered medications on file prior to visit.    BP 150/70  Pulse 80  Temp(Src) 98.1 F (36.7 C) (Oral)  Resp 20  Wt 203 lb (92.08 kg)  BMI 27.53 kg/m2  SpO2 98%          Review of Systems  Constitutional: Negative for fever, chills, appetite change and fatigue.  HENT: Negative for hearing loss, ear pain, congestion, sore throat, trouble swallowing, neck stiffness, dental problem, voice change and tinnitus.   Eyes: Negative for pain, discharge and visual disturbance.  Respiratory: Negative for cough, chest tightness, wheezing and stridor.   Cardiovascular: Negative for chest pain, palpitations and leg swelling.  Gastrointestinal: Negative for nausea, vomiting, abdominal  pain, diarrhea, constipation, blood in stool and abdominal distention.  Genitourinary: Negative for urgency, hematuria, flank pain, discharge, difficulty urinating and genital sores.  Musculoskeletal: Negative for myalgias, back pain, joint swelling, arthralgias and gait problem.  Skin: Negative for rash.  Neurological: Negative for dizziness, syncope, speech difficulty, weakness, numbness and headaches.  Hematological: Negative for adenopathy. Does not bruise/bleed easily.  Psychiatric/Behavioral: Negative for behavioral problems and dysphoric mood. The patient is not nervous/anxious.        Objective:   Physical Exam  Constitutional: He appears well-developed and well-nourished. No distress.  Blood pressure 140/80          Assessment & Plan:  Diabetes. We'll continue present regimen. Will check a hemoglobin A1c next visit Hypertension stable we'll continue present regimen  Modest weight loss encouraged

## 2013-01-08 ENCOUNTER — Other Ambulatory Visit: Payer: Self-pay | Admitting: Internal Medicine

## 2013-01-30 ENCOUNTER — Ambulatory Visit (INDEPENDENT_AMBULATORY_CARE_PROVIDER_SITE_OTHER): Payer: Medicare Other | Admitting: Internal Medicine

## 2013-01-30 ENCOUNTER — Encounter: Payer: Self-pay | Admitting: Internal Medicine

## 2013-01-30 VITALS — BP 120/80 | HR 67 | Temp 98.4°F | Resp 20 | Wt 196.0 lb

## 2013-01-30 DIAGNOSIS — E119 Type 2 diabetes mellitus without complications: Secondary | ICD-10-CM

## 2013-01-30 DIAGNOSIS — I2510 Atherosclerotic heart disease of native coronary artery without angina pectoris: Secondary | ICD-10-CM

## 2013-01-30 DIAGNOSIS — I10 Essential (primary) hypertension: Secondary | ICD-10-CM

## 2013-01-30 LAB — MICROALBUMIN / CREATININE URINE RATIO
Creatinine,U: 54.5 mg/dL
Microalb Creat Ratio: 3.1 mg/g (ref 0.0–30.0)
Microalb, Ur: 1.7 mg/dL (ref 0.0–1.9)

## 2013-01-30 LAB — HEMOGLOBIN A1C: Hgb A1c MFr Bld: 14 % — ABNORMAL HIGH (ref 4.6–6.5)

## 2013-01-30 NOTE — Patient Instructions (Signed)
Please see your eye doctor yearly to check for diabetic eye damage   Please check your hemoglobin A1c every 3 months  Limit your sodium (Salt) intake    It is important that you exercise regularly, at least 20 minutes 3 to 4 times per week.  If you develop chest pain or shortness of breath seek  medical attention. 

## 2013-01-30 NOTE — Progress Notes (Signed)
Subjective:    Patient ID: Patrick Wilcox, male    DOB: 03/07/1946, 67 y.o.   MRN: [id number]  HPI 67 year old patient who is seen today for followup.  Presently he has been on Lantus 26 units at bedtime and NovoLog 10 units prior to breakfast and 15 units prior to his evening meal. He states fasting blood sugars are variable and occasionally approached 200. Rarely they are in the 100 range. He remains on metformin. He has hypertension and coronary artery disease which has been stable. He has some resolving URI symptoms.  Past Medical History  Diagnosis Date  . CORONARY ARTERY DISEASE 11/23/2006  . DIABETES MELLITUS, TYPE II 11/23/2006  . GOUT 05/20/2008  . HYPERLIPIDEMIA 11/23/2006  . HYPERTENSION 11/23/2006  . 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 PNEUMONIA, LEFT LOWER LOBE 09/08/2009    History   Social History  . Marital Status: Single    Spouse Name: N/A    Number of Children: N/A  . Years of Education: N/A   Occupational History  . Not on file.   Social History Main Topics  . Smoking status: Never Smoker   . Smokeless tobacco: Never Used  . Alcohol Use: No  . Drug Use: No  . Sexually Active: Not on file   Other Topics Concern  . Not on file   Social History Narrative  . No narrative on file    Past Surgical History  Procedure Laterality Date  . Coronary angioplasty with stent placement    . Coronary artery bypass graft  2003    Family History  Problem Relation Age of Onset  . Heart disease Mother   . Heart disease Father   . Diabetes Sister   . Diabetes Brother     No Known Allergies  Current Outpatient Prescriptions on File Prior to Visit  Medication Sig Dispense Refill  . allopurinol (ZYLOPRIM) 300 MG tablet Take 1 tablet (300 mg total) by mouth daily.  90 tablet  3  . amLODipine (NORVASC) 10 MG tablet Take 1 tablet (10 mg total) by mouth daily.  90 tablet  6  . aspirin EC 81 MG tablet Take 1 tablet (81 mg total) by mouth daily.      Marland Kitchen atorvastatin  (LIPITOR) 40 MG tablet Take 1 tablet (40 mg total) by mouth daily.  90 tablet  5  . colchicine (COLCRYS) 0.6 MG tablet Take 1 tablet (0.6 mg total) by mouth daily.  90 tablet  6  . furosemide (LASIX) 20 MG tablet Take 1 tablet (20 mg total) by mouth daily.  30 tablet  11  . glucose blood (FREESTYLE TEST STRIPS) test strip 1 each by Other route daily. Use as instructed  100 each  6  . insulin aspart (NOVOLOG) 100 UNIT/ML injection 10 units prior to breakfast 15 units prior to supper  3 vial  PRN  . Insulin Pen Needle 32G X 4 MM MISC 1 each by Does not apply route daily as needed.  100 each  3  . Lancets (FREESTYLE) lancets 1 each by Other route daily.  100 each  3  . LANTUS SOLOSTAR 100 UNIT/ML SOPN INJECT 26 UNITS SUBCUTANEOUSLY AT BEDTIME  15 mL  3  . metFORMIN (GLUCOPHAGE) 1000 MG tablet Take 1 tablet (1,000 mg total) by mouth 2 (two) times daily with a meal.  180 tablet  3  . metoprolol (LOPRESSOR) 100 MG tablet Take 1 tablet (100 mg total) by mouth 2 (two) times daily.  180 tablet  5  . ramipril (ALTACE) 10 MG capsule Take 1 capsule (10 mg total) by mouth daily.  90 capsule  5  . vardenafil (LEVITRA) 20 MG tablet Take 20 mg by mouth daily as needed.       No current facility-administered medications on file prior to visit.    BP 120/80  Pulse 67  Temp(Src) 98.4 F (36.9 C) (Oral)  Resp 20  Wt 196 lb (88.905 kg)  BMI 26.58 kg/m2  SpO2 98%        Review of Systems  Constitutional: Negative for fever, chills, appetite change and fatigue.  HENT: Positive for congestion and rhinorrhea. Negative for hearing loss, ear pain, sore throat, trouble swallowing, neck stiffness, dental problem, voice change and tinnitus.   Eyes: Negative for pain, discharge and visual disturbance.  Respiratory: Positive for cough. Negative for chest tightness, wheezing and stridor.   Cardiovascular: Negative for chest pain, palpitations and leg swelling.  Gastrointestinal: Negative for nausea, vomiting,  abdominal pain, diarrhea, constipation, blood in stool and abdominal distention.  Genitourinary: Negative for urgency, hematuria, flank pain, discharge, difficulty urinating and genital sores.  Musculoskeletal: Negative for myalgias, back pain, joint swelling, arthralgias and gait problem.  Skin: Negative for rash.  Neurological: Negative for dizziness, syncope, speech difficulty, weakness, numbness and headaches.  Hematological: Negative for adenopathy. Does not bruise/bleed easily.  Psychiatric/Behavioral: Negative for behavioral problems and dysphoric mood. The patient is not nervous/anxious.        Objective:   Physical Exam  Constitutional: He is oriented to person, place, and time. He appears well-developed.  HENT:  Head: Normocephalic.  Right Ear: External ear normal.  Left Ear: External ear normal.  Eyes: Conjunctivae and EOM are normal.  Neck: Normal range of motion.  Cardiovascular: Normal rate and normal heart sounds.   Pulmonary/Chest: Breath sounds normal.  Abdominal: Bowel sounds are normal.  Musculoskeletal: Normal range of motion. He exhibits no edema and no tenderness.  Neurological: He is alert and oriented to person, place, and time.  Psychiatric: He has a normal mood and affect. His behavior is normal.          Assessment & Plan:     DM2 we'll check a hemoglobin A1c. We'll also check a urine for microalbumin. An annual eye exam was encouraged Hypertension stable Coronary artery disease. Remains asymptomatic  Recheck 3 months

## 2013-04-05 ENCOUNTER — Other Ambulatory Visit: Payer: Self-pay | Admitting: Internal Medicine

## 2013-04-10 ENCOUNTER — Other Ambulatory Visit: Payer: Self-pay | Admitting: Internal Medicine

## 2013-05-02 ENCOUNTER — Encounter: Payer: Self-pay | Admitting: Internal Medicine

## 2013-05-02 ENCOUNTER — Ambulatory Visit (INDEPENDENT_AMBULATORY_CARE_PROVIDER_SITE_OTHER): Payer: Medicare Other | Admitting: Internal Medicine

## 2013-05-02 VITALS — BP 126/80 | HR 67 | Temp 98.3°F | Resp 20 | Wt 200.0 lb

## 2013-05-02 DIAGNOSIS — I2510 Atherosclerotic heart disease of native coronary artery without angina pectoris: Secondary | ICD-10-CM

## 2013-05-02 DIAGNOSIS — I10 Essential (primary) hypertension: Secondary | ICD-10-CM

## 2013-05-02 DIAGNOSIS — E119 Type 2 diabetes mellitus without complications: Secondary | ICD-10-CM

## 2013-05-02 DIAGNOSIS — Z23 Encounter for immunization: Secondary | ICD-10-CM

## 2013-05-02 LAB — HEMOGLOBIN A1C: Hgb A1c MFr Bld: 13.5 % — ABNORMAL HIGH (ref 4.6–6.5)

## 2013-05-02 MED ORDER — INSULIN LISPRO 100 UNIT/ML (KWIKPEN): PEN_INJECTOR | SUBCUTANEOUS | Status: DC

## 2013-05-02 MED ORDER — NYSTATIN-TRIAMCINOLONE 100000-0.1 UNIT/GM-% EX OINT: TOPICAL_OINTMENT | Freq: Two times a day (BID) | CUTANEOUS | Status: DC

## 2013-05-02 NOTE — Progress Notes (Signed)
Subjective:    Patient ID: Patrick Wilcox, male    DOB: [birth year], 67 y.o.   MRN: [id number]  HPI  67 year old patient who is seen today in followup.  3 months ago hemoglobin A1c was quite elevated at 14.  Insulin therapy was up titrated. Apparently due to formulary considerations NovoLog was switched to Humalog but he is now on a 75/25 Mix. He remains on Lantus 30 units at bedtime but using 75/25  12 units prior to breakfast and 20 units prior to his evening meal. He rarely consumes lunch. He states blood sugars have been well-controlled. No new concerns or complaints except for a intertriginous groin rash  Past Medical History  Diagnosis Date  . CORONARY ARTERY DISEASE 11/23/2006  . DIABETES MELLITUS, TYPE II 11/23/2006  . GOUT 05/20/2008  . HYPERLIPIDEMIA 11/23/2006  . HYPERTENSION 11/23/2006  . 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 PNEUMONIA, LEFT LOWER LOBE 09/08/2009    History   Social History  . Marital Status: Single    Spouse Name: N/A    Number of Children: N/A  . Years of Education: N/A   Occupational History  . Not on file.   Social History Main Topics  . Smoking status: Never Smoker   . Smokeless tobacco: Never Used  . Alcohol Use: No  . Drug Use: No  . Sexual Activity: Not on file   Other Topics Concern  . Not on file   Social History Narrative  . No narrative on file    Past Surgical History  Procedure Laterality Date  . Coronary angioplasty with stent placement    . Coronary artery bypass graft  2003    Family History  Problem Relation Age of Onset  . Heart disease Mother   . Heart disease Father   . Diabetes Sister   . Diabetes Brother     No Known Allergies  Current Outpatient Prescriptions on File Prior to Visit  Medication Sig Dispense Refill  . allopurinol (ZYLOPRIM) 300 MG tablet Take 1 tablet (300 mg total) by mouth daily.  90 tablet  3  . amLODipine (NORVASC) 10 MG tablet Take 1 tablet (10 mg total) by mouth daily.  90 tablet  6  .  atorvastatin (LIPITOR) 40 MG tablet Take 1 tablet (40 mg total) by mouth daily.  90 tablet  5  . colchicine (COLCRYS) 0.6 MG tablet Take 1 tablet (0.6 mg total) by mouth daily.  90 tablet  6  . furosemide (LASIX) 20 MG tablet TAKE 1 TABLET BY MOUTH DAILY  30 tablet  11  . glucose blood (FREESTYLE TEST STRIPS) test strip 1 each by Other route daily. Use as instructed  100 each  6  . Insulin Pen Needle 32G X 4 MM MISC 1 each by Does not apply route daily as needed.  100 each  3  . Lancets (FREESTYLE) lancets 1 each by Other route daily.  100 each  3  . metFORMIN (GLUCOPHAGE) 1000 MG tablet Take 1 tablet (1,000 mg total) by mouth 2 (two) times daily with a meal.  180 tablet  3  . metoprolol (LOPRESSOR) 100 MG tablet Take 1 tablet (100 mg total) by mouth 2 (two) times daily.  180 tablet  5  . ramipril (ALTACE) 10 MG capsule Take 1 capsule (10 mg total) by mouth daily.  90 capsule  5  . vardenafil (LEVITRA) 20 MG tablet Take 20 mg by mouth daily as needed.       No current facility-administered  medications on file prior to visit.    BP 126/80  Pulse 67  Temp(Src) 98.3 F (36.8 C) (Oral)  Resp 20  Wt 200 lb (90.719 kg)  BMI 27.12 kg/m2  SpO2 98%       Review of Systems  Constitutional: Negative for fever, chills, appetite change and fatigue.  HENT: Negative for congestion, dental problem, ear pain, hearing loss, sore throat, tinnitus, trouble swallowing and voice change.   Eyes: Negative for pain, discharge and visual disturbance.  Respiratory: Negative for cough, chest tightness, wheezing and stridor.   Cardiovascular: Negative for chest pain, palpitations and leg swelling.  Gastrointestinal: Negative for nausea, vomiting, abdominal pain, diarrhea, constipation, blood in stool and abdominal distention.  Genitourinary: Negative for urgency, hematuria, flank pain, discharge, difficulty urinating and genital sores.  Musculoskeletal: Negative for arthralgias, back pain, gait problem, joint  swelling, myalgias and neck stiffness.  Skin: Positive for rash.  Neurological: Negative for dizziness, syncope, speech difficulty, weakness, numbness and headaches.  Hematological: Negative for adenopathy. Does not bruise/bleed easily.  Psychiatric/Behavioral: Negative for behavioral problems and dysphoric mood. The patient is not nervous/anxious.        Objective:   Physical Exam  Constitutional: He is oriented to person, place, and time. He appears well-developed.  HENT:  Head: Normocephalic.  Right Ear: External ear normal.  Left Ear: External ear normal.  Eyes: Conjunctivae and EOM are normal.  Neck: Normal range of motion.  Cardiovascular: Normal rate and normal heart sounds.   Pulmonary/Chest: Breath sounds normal.  Abdominal: Bowel sounds are normal.  Musculoskeletal: Normal range of motion. He exhibits no edema and no tenderness.  Neurological: He is alert and oriented to person, place, and time.  Skin: Rash noted.  Psychiatric: He has a normal mood and affect. His behavior is normal.          Assessment & Plan:   Diabetes mellitus. We'll check a hemoglobin A1c. We'll switch back to Humalog and discontinue Humalog mix. Hypertension stable Tinea cruris. Will treat Coronary artery disease stable

## 2013-05-02 NOTE — Patient Instructions (Signed)
Limit your sodium (Salt) intake   Please check your hemoglobin A1c every 3 months   

## 2013-06-07 ENCOUNTER — Other Ambulatory Visit: Payer: Self-pay | Admitting: Internal Medicine

## 2013-06-10 ENCOUNTER — Other Ambulatory Visit: Payer: Self-pay | Admitting: Internal Medicine

## 2013-07-12 ENCOUNTER — Telehealth: Payer: Self-pay | Admitting: Internal Medicine

## 2013-07-12 NOTE — Telephone Encounter (Signed)
Pt wanted his pharmacy information updated to mail order [phone], Drug Order to Diabetes.  And he also will use [facility] on [address] for any retails scripts.

## 2013-07-16 MED ORDER — FUROSEMIDE 20 MG PO TABS: ORAL_TABLET | ORAL | Status: DC

## 2013-07-16 NOTE — Telephone Encounter (Signed)
Spoke to pt asked him if he know the name of the company where he gets Diabetic supplies? Pt stated no just has the phone number that he gave [place]. Told him okay. Also asked pt if he needs other medications to go to [facility] instead of [facility]? Pt stated yes now has [employer] and needs to use [facility] and need refill on Furosemide. Told pt okay will send refills to [facility] for medications. Pt verbalized understanding.

## 2013-07-24 MED ORDER — METOPROLOL TARTRATE 100 MG PO TABS: 100.0000 mg | ORAL_TABLET | Freq: Two times a day (BID) | ORAL | Status: DC

## 2013-07-24 MED ORDER — INSULIN PEN NEEDLE 32G X 4 MM MISC: 1.0000 | Freq: Three times a day (TID) | Status: DC | PRN

## 2013-07-24 MED ORDER — ALLOPURINOL 300 MG PO TABS: ORAL_TABLET | ORAL | Status: DC

## 2013-07-24 MED ORDER — AMLODIPINE BESYLATE 10 MG PO TABS: ORAL_TABLET | ORAL | Status: DC

## 2013-07-24 MED ORDER — GLUCOSE BLOOD VI STRP: 1.0000 | ORAL_STRIP | Freq: Three times a day (TID) | Status: DC | PRN

## 2013-07-24 MED ORDER — COLCHICINE 0.6 MG PO TABS: ORAL_TABLET | ORAL | Status: DC

## 2013-07-24 MED ORDER — ATORVASTATIN CALCIUM 40 MG PO TABS: ORAL_TABLET | ORAL | Status: DC

## 2013-07-24 MED ORDER — INSULIN LISPRO 100 UNIT/ML (KWIKPEN): PEN_INJECTOR | SUBCUTANEOUS | Status: DC

## 2013-07-24 MED ORDER — INSULIN GLARGINE 100 UNIT/ML SOLOSTAR PEN: PEN_INJECTOR | SUBCUTANEOUS | Status: DC

## 2013-07-24 MED ORDER — RAMIPRIL 10 MG PO CAPS: ORAL_CAPSULE | ORAL | Status: DC

## 2013-07-24 MED ORDER — METFORMIN HCL 1000 MG PO TABS: 1000.0000 mg | ORAL_TABLET | Freq: Two times a day (BID) | ORAL | Status: DC

## 2013-07-24 MED ORDER — FREESTYLE LANCETS MISC: 1.0000 | Freq: Three times a day (TID) | Status: DC | PRN

## 2013-07-24 NOTE — Telephone Encounter (Signed)
Spoke to pt told him I called mail order [facility] and they said you need to call and set up before I can send Diabetic supplies. Pt verbalized understanding and stated he just wants to use [facility] for now. Told him okay I will send Diabetic supplies to [facility] and filled other Rx's. Pt verbalized understanding.

## 2013-07-30 ENCOUNTER — Telehealth: Payer: Self-pay | Admitting: Internal Medicine

## 2013-07-30 NOTE — Telephone Encounter (Signed)
[facility] requesting new script for the following:  allopurinol (ZYLOPRIM) 300 MG tablet amLODipine (NORVASC) 10 MG tablet atorvastatin (LIPITOR) 40 MG tablet Insulin Pen Needle 32G X 4 MM MISC furosemide (LASIX) 20 MG tablet metFORMIN (GLUCOPHAGE) 1000 MG tablet Accu-Chek Smartview Test Strips Accu-Chek Fastclix Lancets BD Single Use Swab

## 2013-07-31 NOTE — Telephone Encounter (Signed)
Spoke to pt told him received fax from [facility] for refills on medications. Asked him if I am suppose to send refills to them. Pt stated no refills were sent to [facility] and I do not need anything right now. Told pt okay when you need refills let me know and I will then send it to [facility]. Pt verbalized understanding.

## 2013-08-02 ENCOUNTER — Encounter: Payer: Self-pay | Admitting: Internal Medicine

## 2013-08-02 ENCOUNTER — Ambulatory Visit (INDEPENDENT_AMBULATORY_CARE_PROVIDER_SITE_OTHER): Payer: Medicare HMO | Admitting: Internal Medicine

## 2013-08-02 VITALS — BP 140/80 | HR 83 | Temp 98.4°F | Resp 20 | Ht 72.0 in | Wt 207.0 lb

## 2013-08-02 DIAGNOSIS — E785 Hyperlipidemia, unspecified: Secondary | ICD-10-CM

## 2013-08-02 DIAGNOSIS — M109 Gout, unspecified: Secondary | ICD-10-CM

## 2013-08-02 DIAGNOSIS — E119 Type 2 diabetes mellitus without complications: Secondary | ICD-10-CM

## 2013-08-02 DIAGNOSIS — I10 Essential (primary) hypertension: Secondary | ICD-10-CM

## 2013-08-02 DIAGNOSIS — I2510 Atherosclerotic heart disease of native coronary artery without angina pectoris: Secondary | ICD-10-CM

## 2013-08-02 DIAGNOSIS — Z23 Encounter for immunization: Secondary | ICD-10-CM

## 2013-08-02 LAB — HEMOGLOBIN A1C: Hgb A1c MFr Bld: 11.4 % — ABNORMAL HIGH (ref 4.6–6.5)

## 2013-08-02 NOTE — Progress Notes (Signed)
Subjective:    Patient ID: Patrick Wilcox, male    DOB: 07-02-1946, 68 y.o.   MRN: [id number]  HPI  Lab Results  Component Value Date   HGBA1C 13.5* 05/02/2013   68 year old patient who has a history of CAD status post CABG he has hypertension and dyslipidemia. He is seen today for three-month followup of his diabetes. His last hemoglobin A1c was poorly controlled. Presently he is on Lantus 30 units at bedtime. He states fasting blood sugars generally range from 126-150. He states that he has had low readings of 96 on 2 occasions. He consumes 2 meals daily Allegra this and his evening meal. He takes 20 units of Humalog prior to breakfast and 20 units prior to his evening meal  Past Medical History  Diagnosis Date  . CORONARY ARTERY DISEASE 11/23/2006  . DIABETES MELLITUS, TYPE II 11/23/2006  . GOUT 05/20/2008  . HYPERLIPIDEMIA 11/23/2006  . HYPERTENSION 11/23/2006  . 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 PNEUMONIA, LEFT LOWER LOBE 09/08/2009    History   Social History  . Marital Status: Single    Spouse Name: N/A    Number of Children: N/A  . Years of Education: N/A   Occupational History  . Not on file.   Social History Main Topics  . Smoking status: Never Smoker   . Smokeless tobacco: Never Used  . Alcohol Use: No  . Drug Use: No  . Sexual Activity: Not on file   Other Topics Concern  . Not on file   Social History Narrative  . No narrative on file    Past Surgical History  Procedure Laterality Date  . Coronary angioplasty with stent placement    . Coronary artery bypass graft  2003    Family History  Problem Relation Age of Onset  . Heart disease Mother   . Heart disease Father   . Diabetes Sister   . Diabetes Brother     No Known Allergies  Current Outpatient Prescriptions on File Prior to Visit  Medication Sig Dispense Refill  . allopurinol (ZYLOPRIM) 300 MG tablet take 1 tablet by mouth once daily  90 tablet  3  . amLODipine (NORVASC) 10 MG  tablet take 1 tablet by mouth once daily  90 tablet  1  . atorvastatin (LIPITOR) 40 MG tablet take 1 tablet by mouth once daily  90 tablet  1  . colchicine (COLCRYS) 0.6 MG tablet take 1 tablet by mouth once daily  90 tablet  3  . furosemide (LASIX) 20 MG tablet TAKE 1 TABLET BY MOUTH DAILY  90 tablet  3  . glucose blood (FREESTYLE TEST STRIPS) test strip 1 each by Other route 3 (three) times daily as needed for other.  100 each  12  . Insulin Glargine (LANTUS SOLOSTAR) 100 UNIT/ML Solostar Pen INJECT 30 UNITS SUBCUTANEOUSLY AT BEDTIME  10 pen  1  . insulin lispro (HUMALOG PEN) 100 UNIT/ML KiwkPen 12 units prior to breakfast and lunch and 20 units prior to your evening meal  15 pen  1  . Insulin Pen Needle 32G X 4 MM MISC 1 each by Does not apply route 3 (three) times daily as needed.  100 each  12  . Lancets (FREESTYLE) lancets 1 each by Other route 3 (three) times daily as needed for other.  100 each  12  . metFORMIN (GLUCOPHAGE) 1000 MG tablet Take 1 tablet (1,000 mg total) by mouth 2 (two) times daily with a meal.  180  tablet  1  . metoprolol (LOPRESSOR) 100 MG tablet Take 1 tablet (100 mg total) by mouth 2 (two) times daily.  180 tablet  5  . nystatin-triamcinolone ointment (MYCOLOG) Apply topically 2 (two) times daily.  30 g  0  . ramipril (ALTACE) 10 MG capsule take 1 capsule by mouth once daily  90 capsule  1  . vardenafil (LEVITRA) 20 MG tablet Take 20 mg by mouth daily as needed.       No current facility-administered medications on file prior to visit.    BP 140/80  Pulse 83  Temp(Src) 98.4 F (36.9 C) (Oral)  Resp 20  Ht 6' (1.829 m)  Wt 207 lb (93.895 kg)  BMI 28.07 kg/m2  SpO2 98%     Review of Systems  Constitutional: Negative for fever, chills, appetite change and fatigue.  HENT: Negative for congestion, dental problem, ear pain, hearing loss, sore throat, tinnitus, trouble swallowing and voice change.   Eyes: Negative for pain, discharge and visual disturbance.    Respiratory: Negative for cough, chest tightness, wheezing and stridor.   Cardiovascular: Negative for chest pain, palpitations and leg swelling.  Gastrointestinal: Negative for nausea, vomiting, abdominal pain, diarrhea, constipation, blood in stool and abdominal distention.  Genitourinary: Negative for urgency, hematuria, flank pain, discharge, difficulty urinating and genital sores.  Musculoskeletal: Negative for arthralgias, back pain, gait problem, joint swelling, myalgias and neck stiffness.  Skin: Negative for rash.  Neurological: Negative for dizziness, syncope, speech difficulty, weakness, numbness and headaches.  Hematological: Negative for adenopathy. Does not bruise/bleed easily.  Psychiatric/Behavioral: Negative for behavioral problems and dysphoric mood. The patient is not nervous/anxious.        Objective:   Physical Exam  Constitutional: He is oriented to person, place, and time. He appears well-developed.  HENT:  Head: Normocephalic.  Right Ear: External ear normal.  Left Ear: External ear normal.  Eyes: Conjunctivae and EOM are normal.  Neck: Normal range of motion.  Cardiovascular: Normal rate and normal heart sounds.   Pulmonary/Chest: Breath sounds normal.  Abdominal: Bowel sounds are normal.  Musculoskeletal: Normal range of motion. He exhibits no edema and no tenderness.  Neurological: He is alert and oriented to person, place, and time.  Psychiatric: He has a normal mood and affect. His behavior is normal.          Assessment & Plan:  Diabetes mellitus. Unclear control. Last hemoglobin A1c was very poorly controlled we'll check again today. Hypertension stable Dyslipidemia CAD  Annual eye examination recommended Recheck 3 months More regular exercise encouraged

## 2013-08-02 NOTE — Patient Instructions (Addendum)
Limit your sodium (Salt) intake   Please check your hemoglobin A1c every 3 months    It is important that you exercise regularly, at least 20 minutes 3 to 4 times per week.  If you develop chest pain or shortness of breath seek  medical attention.  You need to lose weight.  Consider a lower calorie diet and regular exercise.  Please see your eye doctor yearly to check for diabetic eye damage   

## 2013-08-02 NOTE — Progress Notes (Signed)
Pre-visit discussion using our clinic review tool. No additional management support is needed unless otherwise documented below in the visit note.  

## 2013-08-06 ENCOUNTER — Telehealth: Payer: Self-pay

## 2013-08-06 NOTE — Telephone Encounter (Signed)
Relevant patient education mailed to patient.  

## 2013-08-15 MED ORDER — ACCU-CHEK FASTCLIX LANCETS MISC
1.0000 | Freq: Three times a day (TID) | Status: DC | PRN
Start: 2013-08-15 — End: 2014-02-06

## 2013-08-15 MED ORDER — GLUCOSE BLOOD VI STRP: 1.0000 | ORAL_STRIP | Freq: Three times a day (TID) | Status: DC | PRN

## 2013-08-15 MED ORDER — BD SWAB SINGLE USE REGULAR PADS: MEDICATED_PAD | Status: DC

## 2013-08-15 NOTE — Addendum Note (Signed)
Addended by: Marian Sorrow on: 08/15/2013 04:07 PM   Modules accepted: Orders

## 2013-08-15 NOTE — Telephone Encounter (Signed)
[facility] requesting new scripts for the following:  Accu-Chek Smartview Test Strips Accu-Check Fastclix Lancets BD Single Use Swab

## 2013-08-15 NOTE — Telephone Encounter (Signed)
Spoke to pt asked him if he needs supplies sent to [facility] for Diabetic supplies. Pt said yes. Told him okay will send Rx's to [facility]. Rx's sent

## 2013-10-15 ENCOUNTER — Ambulatory Visit (INDEPENDENT_AMBULATORY_CARE_PROVIDER_SITE_OTHER): Payer: Commercial Managed Care - HMO | Admitting: Internal Medicine

## 2013-10-15 ENCOUNTER — Encounter: Payer: Self-pay | Admitting: Internal Medicine

## 2013-10-15 VITALS — BP 160/80 | HR 78 | Temp 98.7°F | Resp 20 | Ht 72.0 in | Wt 218.0 lb

## 2013-10-15 DIAGNOSIS — I252 Old myocardial infarction: Secondary | ICD-10-CM

## 2013-10-15 DIAGNOSIS — E785 Hyperlipidemia, unspecified: Secondary | ICD-10-CM

## 2013-10-15 DIAGNOSIS — E119 Type 2 diabetes mellitus without complications: Secondary | ICD-10-CM

## 2013-10-15 DIAGNOSIS — I10 Essential (primary) hypertension: Secondary | ICD-10-CM

## 2013-10-15 NOTE — Progress Notes (Signed)
Pre-visit discussion using our clinic review tool. No additional management support is needed unless otherwise documented below in the visit note.  

## 2013-10-15 NOTE — Progress Notes (Signed)
Subjective:    Patient ID: Patrick Wilcox, male    DOB: 02-21-1946, 68 y.o.   MRN: [id number]  HPI  68 year old patient who is in today for followup of diabetes.  His last known A1c was elevated and basal insulin was increased to 36 units.  He states that he had a subsequent fasting blood sugar of 44 and presently is back to Lantus 30 units at bedtime. He is also on Humalog 15 units before breakfast and 20-25 units prior to his evening meal.  He did not eat lunch. Fasting blood sugars are generally reasonable control.  There's been some significant weight gain since his last visit. \ Past Medical History  Diagnosis Date  . CORONARY ARTERY DISEASE 11/23/2006  . DIABETES MELLITUS, TYPE II 11/23/2006  . GOUT 05/20/2008  . HYPERLIPIDEMIA 11/23/2006  . HYPERTENSION 11/23/2006  . 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 PNEUMONIA, LEFT LOWER LOBE 09/08/2009    History   Social History  . Marital Status: Single    Spouse Name: N/A    Number of Children: N/A  . Years of Education: N/A   Occupational History  . Not on file.   Social History Main Topics  . Smoking status: Never Smoker   . Smokeless tobacco: Never Used  . Alcohol Use: No  . Drug Use: No  . Sexual Activity: Not on file   Other Topics Concern  . Not on file   Social History Narrative  . No narrative on file    Past Surgical History  Procedure Laterality Date  . Coronary angioplasty with stent placement    . Coronary artery bypass graft  2003    Family History  Problem Relation Age of Onset  . Heart disease Mother   . Heart disease Father   . Diabetes Sister   . Diabetes Brother     No Known Allergies  Current Outpatient Prescriptions on File Prior to Visit  Medication Sig Dispense Refill  . ACCU-CHEK FASTCLIX LANCETS MISC 1 each by Does not apply route 3 (three) times daily as needed.  306 each  3  . Alcohol Swabs (B-D SINGLE USE SWABS REGULAR) PADS Use as necessary  300 each  6  . allopurinol (ZYLOPRIM)  300 MG tablet take 1 tablet by mouth once daily  90 tablet  3  . amLODipine (NORVASC) 10 MG tablet take 1 tablet by mouth once daily  90 tablet  1  . atorvastatin (LIPITOR) 40 MG tablet take 1 tablet by mouth once daily  90 tablet  1  . colchicine (COLCRYS) 0.6 MG tablet take 1 tablet by mouth once daily  90 tablet  3  . furosemide (LASIX) 20 MG tablet TAKE 1 TABLET BY MOUTH DAILY  90 tablet  3  . glucose blood test strip 1 each by Other route 3 (three) times daily as needed for other.  300 each  3  . Insulin Glargine (LANTUS SOLOSTAR) 100 UNIT/ML Solostar Pen INJECT 30 UNITS SUBCUTANEOUSLY AT BEDTIME  10 pen  1  . Insulin Pen Needle 32G X 4 MM MISC 1 each by Does not apply route 3 (three) times daily as needed.  100 each  12  . metFORMIN (GLUCOPHAGE) 1000 MG tablet Take 1 tablet (1,000 mg total) by mouth 2 (two) times daily with a meal.  180 tablet  1  . metoprolol (LOPRESSOR) 100 MG tablet Take 1 tablet (100 mg total) by mouth 2 (two) times daily.  180 tablet  5  .  nystatin-triamcinolone ointment (MYCOLOG) Apply topically 2 (two) times daily.  30 g  0  . ramipril (ALTACE) 10 MG capsule take 1 capsule by mouth once daily  90 capsule  1  . vardenafil (LEVITRA) 20 MG tablet Take 20 mg by mouth daily as needed.       No current facility-administered medications on file prior to visit.    BP 160/80  Pulse 78  Temp(Src) 98.7 F (37.1 C) (Oral)  Resp 20  Ht 6' (1.829 m)  Wt 218 lb (98.884 kg)  BMI 29.56 kg/m2  SpO2 98%       Review of Systems  Constitutional: Negative for fever, chills, appetite change and fatigue.  HENT: Negative for congestion, dental problem, ear pain, hearing loss, sore throat, tinnitus, trouble swallowing and voice change.   Eyes: Negative for pain, discharge and visual disturbance.  Respiratory: Negative for cough, chest tightness, wheezing and stridor.   Cardiovascular: Negative for chest pain, palpitations and leg swelling.  Gastrointestinal: Negative for  nausea, vomiting, abdominal pain, diarrhea, constipation, blood in stool and abdominal distention.  Genitourinary: Negative for urgency, hematuria, flank pain, discharge, difficulty urinating and genital sores.  Musculoskeletal: Negative for arthralgias, back pain, gait problem, joint swelling, myalgias and neck stiffness.  Skin: Negative for rash.  Neurological: Negative for dizziness, syncope, speech difficulty, weakness, numbness and headaches.  Hematological: Negative for adenopathy. Does not bruise/bleed easily.  Psychiatric/Behavioral: Negative for behavioral problems and dysphoric mood. The patient is not nervous/anxious.        Objective:   Physical Exam  Constitutional: He is oriented to person, place, and time. He appears well-developed.  HENT:  Head: Normocephalic.  Right Ear: External ear normal.  Left Ear: External ear normal.  Eyes: Conjunctivae and EOM are normal.  Neck: Normal range of motion.  Cardiovascular: Normal rate and normal heart sounds.   Pulmonary/Chest: Breath sounds normal.  Abdominal: Bowel sounds are normal.  Musculoskeletal: Normal range of motion. He exhibits no edema and no tenderness.  Neurological: He is alert and oriented to person, place, and time.  Psychiatric: He has a normal mood and affect. His behavior is normal.          Assessment & Plan:   Diabetes mellitus.  Will continue present regimen.  Encourage exercise and weight loss.  Will recheck hemoglobin A1c next visit Hypertension Coronary artery disease

## 2013-10-15 NOTE — Patient Instructions (Signed)
It is important that you exercise regularly, at least 20 minutes 3 to 4 times per week.  If you develop chest pain or shortness of breath seek  medical attention.  You need to lose weight.  Consider a lower calorie diet and regular exercise.   Please check your hemoglobin A1c every 3 months   

## 2013-10-19 ENCOUNTER — Other Ambulatory Visit: Payer: Self-pay | Admitting: Internal Medicine

## 2013-11-26 ENCOUNTER — Other Ambulatory Visit: Payer: Self-pay | Admitting: Internal Medicine

## 2014-01-09 ENCOUNTER — Other Ambulatory Visit: Payer: Self-pay | Admitting: Internal Medicine

## 2014-01-14 ENCOUNTER — Ambulatory Visit (INDEPENDENT_AMBULATORY_CARE_PROVIDER_SITE_OTHER): Payer: Medicare HMO | Admitting: Internal Medicine

## 2014-01-14 ENCOUNTER — Encounter: Payer: Self-pay | Admitting: Internal Medicine

## 2014-01-14 VITALS — BP 140/70 | HR 65 | Temp 98.4°F | Resp 20 | Ht 72.0 in | Wt 214.0 lb

## 2014-01-14 DIAGNOSIS — I2510 Atherosclerotic heart disease of native coronary artery without angina pectoris: Secondary | ICD-10-CM

## 2014-01-14 DIAGNOSIS — E119 Type 2 diabetes mellitus without complications: Secondary | ICD-10-CM

## 2014-01-14 DIAGNOSIS — I10 Essential (primary) hypertension: Secondary | ICD-10-CM

## 2014-01-14 DIAGNOSIS — E785 Hyperlipidemia, unspecified: Secondary | ICD-10-CM

## 2014-01-14 LAB — HEMOGLOBIN A1C: Hgb A1c MFr Bld: 9.6 % — ABNORMAL HIGH (ref 4.6–6.5)

## 2014-01-14 NOTE — Patient Instructions (Signed)
Please see your eye doctor yearly to check for diabetic eye damage   Please check your hemoglobin A1c every 3 months  Limit your sodium (Salt) intake    It is important that you exercise regularly, at least 20 minutes 3 to 4 times per week.  If you develop chest pain or shortness of breath seek  medical attention. 

## 2014-01-14 NOTE — Progress Notes (Signed)
Subjective:    Patient ID: Patrick Wilcox, male    DOB: 11-10-1945, 68 y.o.   MRN: [id number]  HPI 68 year old patient who is seen today for followup.  He has coronary artery disease status post CABG, which has been stable. He has type 2 diabetes, which has been managed with basal insulin 30 units at bedtime, as well as mealtime insulin 15 units before breakfast and 20 units before his evening meal. He states fasting blood sugars generally run between 140 and 150 but he does have occasional blood sugars less than 100. Likewise, he also states that blood sugars around 4 to 5:00 also, usually in the 140-150 range.  He states that he has occasional blood sugars as low as the forties and fifties Hemoglobin A1c's have been very poorly control No recent eye examination.  Past Medical History  Diagnosis Date  . CORONARY ARTERY DISEASE 11/23/2006  . DIABETES MELLITUS, TYPE II 11/23/2006  . GOUT 05/20/2008  . HYPERLIPIDEMIA 11/23/2006  . HYPERTENSION 11/23/2006  . 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 PNEUMONIA, LEFT LOWER LOBE 09/08/2009    History   Social History  . Marital Status: Single    Spouse Name: N/A    Number of Children: N/A  . Years of Education: N/A   Occupational History  . Not on file.   Social History Main Topics  . Smoking status: Never Smoker   . Smokeless tobacco: Never Used  . Alcohol Use: No  . Drug Use: No  . Sexual Activity: Not on file   Other Topics Concern  . Not on file   Social History Narrative  . No narrative on file    Past Surgical History  Procedure Laterality Date  . Coronary angioplasty with stent placement    . Coronary artery bypass graft  2003    Family History  Problem Relation Age of Onset  . Heart disease Mother   . Heart disease Father   . Diabetes Sister   . Diabetes Brother     No Known Allergies  Current Outpatient Prescriptions on File Prior to Visit  Medication Sig Dispense Refill  . ACCU-CHEK FASTCLIX LANCETS MISC  1 each by Does not apply route 3 (three) times daily as needed.  306 each  3  . Alcohol Swabs (B-D SINGLE USE SWABS REGULAR) PADS Use as necessary  300 each  6  . allopurinol (ZYLOPRIM) 300 MG tablet take 1 tablet by mouth once daily  90 tablet  3  . amLODipine (NORVASC) 10 MG tablet take 1 tablet by mouth once daily  90 tablet  1  . atorvastatin (LIPITOR) 40 MG tablet take 1 tablet by mouth once daily  90 tablet  1  . colchicine (COLCRYS) 0.6 MG tablet take 1 tablet by mouth once daily  90 tablet  3  . furosemide (LASIX) 20 MG tablet TAKE 1 TABLET BY MOUTH DAILY  90 tablet  3  . glucose blood test strip 1 each by Other route 3 (three) times daily as needed for other.  300 each  3  . HUMALOG KWIKPEN 100 UNIT/ML KiwkPen INJECT 15 UNITS INTO THE SKIN PRIOR TO BREAKFAST AND 20 TO 25 UNITS PRIOR TO EVENING MEAL  15 pen  1  . insulin lispro (HUMALOG) 100 UNIT/ML KiwkPen 15 units prior to breakfast and 20 units prior to your evening meal      . Insulin Pen Needle 32G X 4 MM MISC 1 each by Does not apply route 3 (three)  times daily as needed.  100 each  12  . LANTUS SOLOSTAR 100 UNIT/ML Solostar Pen INJECT 30 UNITS SUBCUTANEOUSLY AT BEDTIME  15 pen  3  . metFORMIN (GLUCOPHAGE) 1000 MG tablet Take 1 tablet (1,000 mg total) by mouth 2 (two) times daily with a meal.  180 tablet  1  . metoprolol (LOPRESSOR) 100 MG tablet Take 1 tablet (100 mg total) by mouth 2 (two) times daily.  180 tablet  5  . nystatin-triamcinolone ointment (MYCOLOG) Apply topically 2 (two) times daily.  30 g  0  . ramipril (ALTACE) 10 MG capsule take 1 capsule by mouth once daily  90 capsule  1  . vardenafil (LEVITRA) 20 MG tablet Take 20 mg by mouth daily as needed.       No current facility-administered medications on file prior to visit.    BP 140/70  Pulse 65  Temp(Src) 98.4 F (36.9 C) (Oral)  Resp 20  Ht 6' (1.829 m)  Wt 214 lb (97.07 kg)  BMI 29.02 kg/m2  SpO2 98%       Review of Systems  Constitutional:  Negative for fever, chills, appetite change and fatigue.  HENT: Negative for congestion, dental problem, ear pain, hearing loss, sore throat, tinnitus, trouble swallowing and voice change.   Eyes: Negative for pain, discharge and visual disturbance.  Respiratory: Negative for cough, chest tightness, wheezing and stridor.   Cardiovascular: Negative for chest pain, palpitations and leg swelling.  Gastrointestinal: Negative for nausea, vomiting, abdominal pain, diarrhea, constipation, blood in stool and abdominal distention.  Genitourinary: Negative for urgency, hematuria, flank pain, discharge, difficulty urinating and genital sores.  Musculoskeletal: Negative for arthralgias, back pain, gait problem, joint swelling, myalgias and neck stiffness.  Skin: Negative for rash.  Neurological: Negative for dizziness, syncope, speech difficulty, weakness, numbness and headaches.  Hematological: Negative for adenopathy. Does not bruise/bleed easily.  Psychiatric/Behavioral: Negative for behavioral problems and dysphoric mood. The patient is not nervous/anxious.        Objective:   Physical Exam  Constitutional: He is oriented to person, place, and time. He appears well-developed.  HENT:  Head: Normocephalic.  Right Ear: External ear normal.  Left Ear: External ear normal.  Eyes: Conjunctivae and EOM are normal.  Neck: Normal range of motion.  Cardiovascular: Normal rate and normal heart sounds.   Pulmonary/Chest: Breath sounds normal.  Abdominal: Bowel sounds are normal.  Musculoskeletal: Normal range of motion. He exhibits no edema and no tenderness.  Neurological: He is alert and oriented to person, place, and time.  Psychiatric: He has a normal mood and affect. His behavior is normal.          Assessment & Plan:   Diabetes mellitus.  We'll check a hemoglobin A1c.  If still poorly controlled.  Will refer to endocrine Hypertension stable Dyslipidemia Coronary artery disease  We'll  schedule appointment with ophthalmology

## 2014-01-14 NOTE — Progress Notes (Signed)
Pre visit review using our clinic review tool, if applicable. No additional management support is needed unless otherwise documented below in the visit note. 

## 2014-01-21 ENCOUNTER — Other Ambulatory Visit: Payer: Self-pay | Admitting: Internal Medicine

## 2014-01-21 DIAGNOSIS — E119 Type 2 diabetes mellitus without complications: Secondary | ICD-10-CM

## 2014-02-05 ENCOUNTER — Other Ambulatory Visit: Payer: Self-pay | Admitting: Internal Medicine

## 2014-02-05 DIAGNOSIS — E119 Type 2 diabetes mellitus without complications: Secondary | ICD-10-CM

## 2014-02-05 DIAGNOSIS — E785 Hyperlipidemia, unspecified: Secondary | ICD-10-CM

## 2014-02-05 LAB — HM DIABETES EYE EXAM

## 2014-02-06 ENCOUNTER — Encounter: Payer: Self-pay | Admitting: Endocrinology

## 2014-02-06 ENCOUNTER — Telehealth: Payer: Self-pay | Admitting: Endocrinology

## 2014-02-06 ENCOUNTER — Ambulatory Visit (INDEPENDENT_AMBULATORY_CARE_PROVIDER_SITE_OTHER): Payer: Medicare HMO | Admitting: Endocrinology

## 2014-02-06 ENCOUNTER — Other Ambulatory Visit: Payer: Self-pay | Admitting: *Deleted

## 2014-02-06 VITALS — BP 158/80 | HR 73 | Temp 97.9°F | Resp 16 | Ht 72.0 in | Wt 212.4 lb

## 2014-02-06 DIAGNOSIS — E1165 Type 2 diabetes mellitus with hyperglycemia: Principal | ICD-10-CM

## 2014-02-06 DIAGNOSIS — E119 Type 2 diabetes mellitus without complications: Secondary | ICD-10-CM

## 2014-02-06 DIAGNOSIS — IMO0001 Reserved for inherently not codable concepts without codable children: Secondary | ICD-10-CM

## 2014-02-06 LAB — LIPID PANEL
Cholesterol: 101 mg/dL (ref 0–200)
HDL: 37.3 mg/dL — ABNORMAL LOW (ref >39.00)
LDL Cholesterol: 45 mg/dL (ref 0–99)
NonHDL: 63.7
Total CHOL/HDL Ratio: 3
Triglycerides: 94 mg/dL (ref 0.0–149.0)
VLDL: 18.8 mg/dL (ref 0.0–40.0)

## 2014-02-06 LAB — URINALYSIS, ROUTINE W REFLEX MICROSCOPIC
Bilirubin Urine: NEGATIVE
Hgb urine dipstick: NEGATIVE
Ketones, ur: NEGATIVE
Leukocytes, UA: NEGATIVE
Nitrite: NEGATIVE
Specific Gravity, Urine: 1.015 (ref 1.000–1.030)
Total Protein, Urine: NEGATIVE
Urine Glucose: >=1000 — AB
Urobilinogen, UA: 0.2 (ref 0.0–1.0)
pH: 5.5 (ref 5.0–8.0)

## 2014-02-06 LAB — COMPREHENSIVE METABOLIC PANEL
ALT: 36 U/L (ref 0–53)
AST: 28 U/L (ref 0–37)
Albumin: 4.4 g/dL (ref 3.5–5.2)
Alkaline Phosphatase: 116 U/L (ref 39–117)
BUN: 10 mg/dL (ref 6–23)
CO2: 24 mEq/L (ref 19–32)
Calcium: 9.6 mg/dL (ref 8.4–10.5)
Chloride: 101 mEq/L (ref 96–112)
Creatinine, Ser: 0.8 mg/dL (ref 0.4–1.5)
GFR: 123.52 mL/min (ref >60.00)
Glucose, Bld: 172 mg/dL — ABNORMAL HIGH (ref 70–99)
Potassium: 4.4 mEq/L (ref 3.5–5.1)
Sodium: 138 mEq/L (ref 135–145)
Total Bilirubin: 0.9 mg/dL (ref 0.2–1.2)
Total Protein: 8.2 g/dL (ref 6.0–8.3)

## 2014-02-06 LAB — GLUCOSE, POCT (MANUAL RESULT ENTRY): POC Glucose: 213 mg/dl — AB (ref 70–99)

## 2014-02-06 LAB — MICROALBUMIN / CREATININE URINE RATIO
Creatinine,U: 51.8 mg/dL
Microalb Creat Ratio: 6.4 mg/g (ref 0.0–30.0)
Microalb, Ur: 3.3 mg/dL — ABNORMAL HIGH (ref 0.0–1.9)

## 2014-02-06 MED ORDER — ACCU-CHEK FASTCLIX LANCETS MISC: Status: DC

## 2014-02-06 MED ORDER — GLUCOSE BLOOD VI STRP: ORAL_STRIP | Status: DC

## 2014-02-06 NOTE — Patient Instructions (Addendum)
Increase Lantus insulin to 34 units daily Take Humalog insulin 10-15 minutes before eating: Continue 15 units before breakfast and 20 units before supper  Please check blood sugars at least half the time about 2 hours after any meal and as directed on waking up.  Please bring blood sugar monitor to each visit  If the blood sugar about 2 hours after breakfast was over 200 increase the morning dose to 18 units If the blood sugar 2 hours after dinner is over 200, increase the dose to 24 units  NEW INSULINS:   The Lantus insulin will be replaced by HUMULIN NPH: Take 14 units in the morning before breakfast and 18 units at bedtime   Check insurance for the cost of Humulin regular or Novolin regular insulin

## 2014-02-06 NOTE — Telephone Encounter (Signed)
His [facility] does carry the novolin.   Patient will be out of the humalog in 2 wks

## 2014-02-06 NOTE — Progress Notes (Signed)
Patient ID: Patrick Wilcox, male   DOB: 12-26-1945, 68 y.o.   MRN: [id number]    Reason for Appointment: Consultation for Type 2 Diabetes  Referring physician: Burnice Logan  History of Present Illness:          Diagnosis: Type 2 diabetes mellitus, date of diagnosis: 2004       Past history:  At diagnosis he was having symptoms of drowsiness, difficulty breathing and blood sugar was reportedly over 600 He was initially treated with insulin but subsequently switched back to oral drugs He may have taken metformin and other medications for some time before going back on insulin His records indicate that he had been taking Amaryl and Januvia before going back on insulin in mid-2014 when his A1c had gone up over 13 Initially was given premixed insulin and then switched to Lantus and Humalog Record of his A1c indicates that his levels have been over 8% since 02/2012  Recent history:  His blood sugars have been difficult to control and his A1c recently was 9.6% He has been taking Lantus at night and Humalog with meals Currently using an old FreeStyle monitor for checking his blood sugar which cannot be downloaded He is checking his blood sugars only before breakfast and supper and he thinks they are fairly good By history his morning sugars seem to be more variable and he does not know it was over 200 today which is unusual No hypoglycemia Mealtime insulin: He is taking 15 minutes after eating and does not adjusted based on his meal size Glucose in the office today was over 200 after breakfast He had lost about 6 pounds in the last 3 months       Oral hypoglycemic drugs the patient is taking are: Metformin 1 g twice a day      INSULIN regimen is described as:  Lantus 30 hs, Humalog 15 after breakfast and 20 after supper   Compliance with the medical regimen: Good Hypoglycemia: None    Glucose monitoring: Checking 1-2 times a day        Glucometer:  FreeStyle    Blood Glucose readings  from  recall; unable to download meter  PREMEAL Breakfast Lunch Dinner Bedtime  Overall   Glucose range: 92- 226  130-140 ?   Median:         Glycemic control:   Lab Results  Component Value Date   HGBA1C 9.6* 01/14/2014   HGBA1C 11.4* 08/02/2013   HGBA1C 13.5* 05/02/2013   Lab Results  Component Value Date   MICROALBUR 1.7 01/30/2013   LDLCALC 52 09/07/2010   CREATININE 0.8 09/07/2010    Retinal exam: Most recent:.7/15   Self-care: The diet that the patient has been following is: tries to limit fats.he has seen the dietitian and 2014       Meals: 2 meals per day. Breakfast is cereal or eggs, some sweets, has fruit for snacks or ice cream. Evening meal usually vegetables, tuna salad, chicken. Sometimes eating desserts like cake           Exercise:  walking 3/7 days a week, usually 40 minutes in the morning         Dietician visit: Most recent: 2014.               Weight history: Wt Readings from Last 3 Encounters:  02/06/14 212 lb 6.4 oz (96.344 kg)  01/14/14 214 lb (97.07 kg)  10/15/13 218 lb (98.884 kg)       Medication List  This list is accurate as of: 02/06/14 11:38 AM.  Always use your most recent med list.               ACCU-CHEK FASTCLIX LANCETS Misc  1 each by Does not apply route 3 (three) times daily as needed.     allopurinol 300 MG tablet  Commonly known as:  ZYLOPRIM  take 1 tablet by mouth once daily     amLODipine 10 MG tablet  Commonly known as:  NORVASC  take 1 tablet by mouth once daily     aspirin 81 MG tablet  Take 81 mg by mouth daily.     atorvastatin 40 MG tablet  Commonly known as:  LIPITOR  take 1 tablet by mouth once daily     B-D SINGLE USE SWABS REGULAR Pads  Use as necessary     colchicine 0.6 MG tablet  Commonly known as:  COLCRYS  take 1 tablet by mouth once daily     furosemide 20 MG tablet  Commonly known as:  LASIX  TAKE 1 TABLET BY MOUTH DAILY     glucose blood test strip  1 each by Other route 3 (three) times  daily as needed for other.     HUMALOG KWIKPEN 100 UNIT/ML KiwkPen  Generic drug:  insulin lispro  INJECT 15 UNITS INTO THE SKIN PRIOR TO BREAKFAST AND 20 TO 25 UNITS PRIOR TO EVENING MEAL     Insulin Pen Needle 32G X 4 MM Misc  1 each by Does not apply route 3 (three) times daily as needed.     LANTUS SOLOSTAR 100 UNIT/ML Solostar Pen  Generic drug:  Insulin Glargine  INJECT 30 UNITS SUBCUTANEOUSLY AT BEDTIME     metFORMIN 1000 MG tablet  Commonly known as:  GLUCOPHAGE  Take 1 tablet (1,000 mg total) by mouth 2 (two) times daily with a meal.     metoprolol 100 MG tablet  Commonly known as:  LOPRESSOR  Take 1 tablet (100 mg total) by mouth 2 (two) times daily.     nystatin-triamcinolone ointment  Commonly known as:  MYCOLOG  Apply topically 2 (two) times daily.     ramipril 10 MG capsule  Commonly known as:  ALTACE  take 1 capsule by mouth once daily     vardenafil 20 MG tablet  Commonly known as:  LEVITRA  Take 20 mg by mouth daily as needed.        Allergies: No Known Allergies  Past Medical History  Diagnosis Date  . CORONARY ARTERY DISEASE 11/23/2006  . DIABETES MELLITUS, TYPE II 11/23/2006  . GOUT 05/20/2008  . HYPERLIPIDEMIA 11/23/2006  . HYPERTENSION 11/23/2006  . 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 PNEUMONIA, LEFT LOWER LOBE 09/08/2009    Past Surgical History  Procedure Laterality Date  . Coronary angioplasty with stent placement    . Coronary artery bypass graft  2003    Family History  Problem Relation Age of Onset  . Heart disease Mother   . Heart disease Father   . Diabetes Sister   . Diabetes Brother     Social History:  reports that he has never smoked. He has never used smokeless tobacco. He reports that he does not drink alcohol or use illicit drugs.    Review of Systems       Lipids: Adequately treated with Lipitor, this was started after his MI       Lab Results  Component Value Date   CHOL 95  09/07/2010   HDL 27.40*  09/07/2010   LDLCALC 52 09/07/2010   TRIG 79.0 09/07/2010   CHOLHDL 3 09/07/2010                  Skin: No rash or infections     Thyroid:  No  unusual fatigue.     The blood pressure has been controlled with Norvasc, ramipril and metoprolol      No recent swelling of feet. Is on Lasix 20 mg     No shortness of breath or chest tightness  on exertion.     Bowel habits: Normal.       No frequency of urination during the day but he drinks a lot of water. Has nocturia  2-3 times      He has history of erectile dysfunction, has been prescribed Levitra      Has some knee joint  pains but is able to walk.       Has history of gout, recently not a problem     He has no calf pain on walking         No history of Numbness or burning in feet. May occasionally have tingling    All other review of systems is negative    LABS:  No visits with results within 1 Week(s) from this visit. Latest known visit with results is:  Office Visit on 01/14/2014  Component Date Value Ref Range Status  . Hemoglobin A1C 01/14/2014 9.6* 4.6 - 6.5 % Final   Glycemic Control Guidelines for People with Diabetes:Non Diabetic:  <6%Goal of Therapy: <7%Additional Action Suggested:  >8%     Physical Examination:  BP 158/80  Pulse 73  Temp(Src) 97.9 F (36.6 C)  Resp 16  Ht 6' (1.829 m)  Wt 212 lb 6.4 oz (96.344 kg)  BMI 28.80 kg/m2  SpO2 99%  GENERAL:         Patient has a large built. Not obese but has significant abdominal obesity   HEENT:         Eye exam shows normal external appearance. Fundus exam shows no retinopathy. Oral exam shows normal mucosa .  NECK:         General:  Neck exam shows no lymphadenopathy. Carotids are normal to palpation and no bruit heard.  Thyroid is not enlarged and no nodules felt.   LUNGS:         Chest is symmetrical. Lungs are clear to auscultation.Marland Kitchen   HEART:         Heart sounds:  S1 and S2 are normal. No murmurs or clicks heard., no S3 or S4.   ABDOMEN:   There  is no distention present. Liver and spleen are not palpable. No other mass or tenderness present.  EXTREMITIES:     There is no edema. No skin lesions present.Marland Kitchen  NEUROLOGICAL:   Vibration sense is mildly reduced in toes. Ankle jerks are absent bilaterally.          Diabetic foot exam: Diabetic foot exam shows normal monofilament sensation in the toes and plantar surfaces, no skin lesions or ulcers on the feet and normal pedal pulses except the right dorsalis pedis MUSCULOSKELETAL:       There is no enlargement or deformity of the joints. Spine is normal to inspection.Marland Kitchen   SKIN:       No rash or lesions of concern.        ASSESSMENT:  Diabetes type 2, uncontrolled  He has had  poor control since at least 2013 Despite been on insulin his A1c has been significantly high since 2014 Overall his compliance with diet and exercise is fairly good and appears to be needing more insulin than what he is taking now    Unable to download the patient's monitor and not clear what his blood sugar patterns are He appears to have inconsistent blood sugars fasting although suppertime readings are reportedly normal He is using a very old glucose monitor which may not be accurate Since his A1c has been significantly high and his pre-meal readings are not usually very high he probably has higher postprandial readings Currently not checking readings after meals at all Other problems:  Eating high carbohydrate breakfast on some days  Occasionally eating sweets without adjusting his insulin coverage  Taking Humalog insulin postprandially instead of before meals  Inadequate education regarding day-to-day diabetes management  Complications: No evidence of neuropathy, needs evaluation of urine microalbumin, reportedly no retinopathy  PLAN:   Change mealtime insulin to 10-15 minutes before eating  Start using a new glucose monitor, instructed on using Accu-Chek [employer] plus  Start doing half of the readings about  2 hours after meals  Increase Lantus to 34 for better basal action  He will look into the cost of NPH and regular insulin since he is concerned about the cost of Lantus and Humalog  Meanwhile he will continue Lantus and Humalog and given a voucher for free Humalog sample  Consider followup diabetes education  More protein at meals especially breakfast  Needs assessment of lipids and urine microalbumin as well as renal/hepatic function today  Continue metformin 1 g twice a day Detailed instructions as follows:  Patient Instructions  Increase Lantus insulin to 34 units daily Take Humalog insulin 10-15 minutes before eating: Continue 15 units before breakfast and 20 units before supper  Please check blood sugars at least half the time about 2 hours after any meal and as directed on waking up.  Please bring blood sugar monitor to each visit  If the blood sugar about 2 hours after breakfast was over 200 increase the morning dose to 18 units If the blood sugar 2 hours after dinner is over 200, increase the dose to 24 units  NEW INSULINS:   The Lantus insulin will be replaced by HUMULIN NPH: Take 14 units in the morning before breakfast and 18 units at bedtime   Check insurance for the cost of Humulin regular or Novolin regular insulin   Counseling time over 50% of today's 60 minute visit   Geralene Afshar 02/06/2014, 11:38 AM   Note: This office note was prepared with Estate agent. Any transcriptional errors that result from this process are unintentional.

## 2014-02-06 NOTE — Telephone Encounter (Signed)
Please see below, he didn't say anything about the cost.

## 2014-02-06 NOTE — Telephone Encounter (Signed)
He needs to know the cost of Novolin NPH and Novolin regular

## 2014-02-06 NOTE — Telephone Encounter (Signed)
Patient states the pharmacy would not tell him how much they cost without an rx.  Patient states he's got about 2 weeks left of his current insulin and will call when he needs to try to new rx

## 2014-02-25 ENCOUNTER — Other Ambulatory Visit: Payer: Self-pay | Admitting: *Deleted

## 2014-02-25 MED ORDER — INSULIN ASPART 100 UNIT/ML FLEXPEN: PEN_INJECTOR | SUBCUTANEOUS | Status: DC

## 2014-03-04 ENCOUNTER — Ambulatory Visit: Payer: Medicare HMO | Admitting: Endocrinology

## 2014-03-10 ENCOUNTER — Encounter: Payer: Self-pay | Admitting: Internal Medicine

## 2014-03-11 ENCOUNTER — Other Ambulatory Visit: Payer: Self-pay | Admitting: Internal Medicine

## 2014-04-02 ENCOUNTER — Ambulatory Visit (INDEPENDENT_AMBULATORY_CARE_PROVIDER_SITE_OTHER): Payer: Medicare HMO | Admitting: Endocrinology

## 2014-04-02 ENCOUNTER — Encounter: Payer: Self-pay | Admitting: Endocrinology

## 2014-04-02 VITALS — BP 138/84 | HR 81 | Temp 98.3°F | Ht 72.0 in | Wt 211.0 lb

## 2014-04-02 DIAGNOSIS — E1165 Type 2 diabetes mellitus with hyperglycemia: Principal | ICD-10-CM

## 2014-04-02 DIAGNOSIS — IMO0001 Reserved for inherently not codable concepts without codable children: Secondary | ICD-10-CM

## 2014-04-02 DIAGNOSIS — I10 Essential (primary) hypertension: Secondary | ICD-10-CM

## 2014-04-02 DIAGNOSIS — Z23 Encounter for immunization: Secondary | ICD-10-CM

## 2014-04-02 MED ORDER — INSULIN LISPRO 100 UNIT/ML (KWIKPEN): PEN_INJECTOR | SUBCUTANEOUS | Status: DC

## 2014-04-02 NOTE — Addendum Note (Signed)
Addended by: Moody Bruins E on: 04/02/2014 02:20 PM   Modules accepted: Orders

## 2014-04-02 NOTE — Patient Instructions (Addendum)
LANTUS 34 UNITS IN AM, if am sugar still over 140 go up to 36  Novolog OR Humalog 18 at breakfast and 22 at dinner: MUST TAKE THIS 5-15 MIN BEFORE MEALS  Please check blood sugars at least half the time about 2 hours after any meal and times per week on waking up. Please bring blood sugar monitor to each visit

## 2014-04-02 NOTE — Progress Notes (Signed)
Patient ID: Patrick Wilcox, male   DOB: March 20, 1946, 68 y.o.   MRN: [id number]    Reason for Appointment: Followup for Type 2 Diabetes  Referring physician: Burnice Logan  History of Present Illness:          Diagnosis: Type 2 diabetes mellitus, date of diagnosis: 2004       Past history:  At diagnosis he was having symptoms of drowsiness, difficulty breathing and blood sugar was reportedly over 600 He was initially treated with insulin but subsequently switched back to oral drugs He may have taken metformin and other medications for some time before going back on insulin His records indicate that he had been taking Amaryl and Januvia before going back on insulin in mid-2014 when his A1c had gone up over 13 Initially was given premixed insulin and then switched to Lantus and Humalog Record of his A1c indicates that his levels have been over 8% since 02/2012  Recent history:  His blood sugars have been difficult to control with his last A1c of 9.6% He was seen in consultation in 7/15 but did not followup until today On his last visit he was advised to increase his Lantus to 34 as well as start taking his Humalog before meals instead of after Also he was told to start checking some readings in between meals or after supper He has not made any of these changes despite being given written instructions  He did not bring his monitor for download today and not clear what his level of control is He was given a new Accu-Chek monitor on the last visit Although he does not eat lunch his blood sugar appears to be higher before supper He has been taking his Humalog after meals Currently is trying to keep up with his walking but his weight has come down only a pound or 2       Oral hypoglycemic drugs the patient is taking are: Metformin 1 g twice a day      INSULIN regimen is described as:  Lantus 30 hs, Humalog 15 after breakfast and 20 after supper   Compliance with the medical regimen: Good   Hypoglycemia: None    Glucose monitoring: Checking 1-2 times a day        Glucometer:  FreeStyle    Blood Glucose readings  from recall   PREMEAL Breakfast Lunch Dinner Bedtime  Overall   Glucose range:  130-160  180 ?   Median:         Glycemic control:   Lab Results  Component Value Date   HGBA1C 9.6* 01/14/2014   HGBA1C 11.4* 08/02/2013   HGBA1C 13.5* 05/02/2013   Lab Results  Component Value Date   MICROALBUR 3.3* 02/06/2014   LDLCALC 45 02/06/2014   CREATININE 0.8 02/06/2014    Retinal exam: Most recent:.7/15   Self-care: The diet that the patient has been following is: tries to limit fats.he has seen the dietitian and 2014       Meals: 2 meals per day. Breakfast is cereal or eggs, some sweets, has fruit for snacks. Evening meal usually vegetables, tuna salad, chicken.  Sometimes eating desserts like cake           Exercise:  walking 3/7 days a week, usually 45 minutes in the morning         Dietician visit: Most recent: 2014.               Weight history: Wt Readings from Last 3 Encounters:  04/02/14 211 lb (95.709 kg)  02/06/14 212 lb 6.4 oz (96.344 kg)  01/14/14 214 lb (97.07 kg)       Medication List       This list is accurate as of: 04/02/14 12:22 PM.  Always use your most recent med list.               ACCU-CHEK FASTCLIX LANCETS Misc  Use to check blood sugar 3 times per day dx code 250.00     allopurinol 300 MG tablet  Commonly known as:  ZYLOPRIM  take 1 tablet by mouth once daily     amLODipine 10 MG tablet  Commonly known as:  NORVASC  TAKE ONE TABLET BY MOUTH ONCE DAILY     aspirin 81 MG tablet  Take 81 mg by mouth daily.     atorvastatin 40 MG tablet  Commonly known as:  LIPITOR  TAKE ONE TABLET BY MOUTH ONCE DAILY     B-D SINGLE USE SWABS REGULAR Pads  Use as necessary     colchicine 0.6 MG tablet  Commonly known as:  COLCRYS  take 1 tablet by mouth once daily     furosemide 20 MG tablet  Commonly known as:  LASIX  TAKE 1  TABLET BY MOUTH DAILY     glucose blood test strip  Commonly known as:  ACCU-CHEK AVIVA PLUS  Use as instructed to check blood sugar 3 times per day dx code 250.00     insulin aspart 100 UNIT/ML FlexPen  Commonly known as:  NOVOLOG FLEXPEN  Inject 15 units into skin prior to breakfast and 20 to 25 units prior to evening meal     Insulin Pen Needle 32G X 4 MM Misc  1 each by Does not apply route 3 (three) times daily as needed.     LANTUS SOLOSTAR 100 UNIT/ML Solostar Pen  Generic drug:  Insulin Glargine  INJECT 30 UNITS SUBCUTANEOUSLY AT BEDTIME     metFORMIN 1000 MG tablet  Commonly known as:  GLUCOPHAGE  Take 1 tablet (1,000 mg total) by mouth 2 (two) times daily with a meal.     metoprolol 100 MG tablet  Commonly known as:  LOPRESSOR  Take 1 tablet (100 mg total) by mouth 2 (two) times daily.     nystatin-triamcinolone ointment  Commonly known as:  MYCOLOG  Apply topically 2 (two) times daily.     ramipril 10 MG capsule  Commonly known as:  ALTACE  TAKE ONE CAPSULE BY MOUTH ONCE DAILY.     vardenafil 20 MG tablet  Commonly known as:  LEVITRA  Take 20 mg by mouth daily as needed.        Allergies: No Known Allergies  Past Medical History  Diagnosis Date  . CORONARY ARTERY DISEASE 11/23/2006  . DIABETES MELLITUS, TYPE II 11/23/2006  . GOUT 05/20/2008  . HYPERLIPIDEMIA 11/23/2006  . HYPERTENSION 11/23/2006  . 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 PNEUMONIA, LEFT LOWER LOBE 09/08/2009    Past Surgical History  Procedure Laterality Date  . Coronary angioplasty with stent placement    . Coronary artery bypass graft  2003    Family History  Problem Relation Age of Onset  . Heart disease Mother   . Heart disease Father   . Diabetes Sister   . Diabetes Brother     Social History:  reports that he has never smoked. He has never used smokeless tobacco. He reports that he does not drink alcohol or use  illicit drugs.    Review of Systems       Lipids:  Adequately treated with Lipitor, this was started after his MI       Lab Results  Component Value Date   CHOL 101 02/06/2014   HDL 37.30* 02/06/2014   LDLCALC 45 02/06/2014   TRIG 94.0 02/06/2014   CHOLHDL 3 02/06/2014       The blood pressure has been treated with Norvasc, ramipril and metoprolol      No recent swelling of feet. Is on Lasix 20 mg  Complications: No evidence of neuropathy or nephropathy      LABS:  No visits with results within 1 Week(s) from this visit. Latest known visit with results is:  Abstract on 03/10/2014  Component Date Value Ref Range Status  . HM Diabetic Eye Exam 02/05/2014 No Retinopathy  No Retinopathy Final   [place], Ophthalmology    Physical Examination:  BP 138/84  Pulse 81  Temp(Src) 98.3 F (36.8 C) (Oral)  Ht 6' (1.829 m)  Wt 211 lb (95.709 kg)  BMI 28.61 kg/m2  SpO2 98%  Repeat blood pressure 138/84 No pedal edema present       ASSESSMENT/PLAN  Diabetes type 2, uncontrolled   He has had poor control with regimen of Lantus and NovoLog along with metformin See history of present illness for detailed discussion of current management, problems identified and blood sugar patterns He did not follow any instructions that he was given on his last visit about 2 months ago and blood sugar levels are probably about the same No recent A1c available  He appears to need considerable diabetes education and reinforcement to be able to get better control He is also concerned about the cost of his insulin. Offered him NPH and regular insulin but he refuses to consider an insulin syringe instead of his pens Recommendations:   For better glucose control during the daytime he will switch his Lantus to the morning and increase the dose  Discussed titration of Lantus insulin  Will increase his NovoLog by 2-4 units for now and adjust further based on postprandial readings  He will start using the Accu-Chek meter and bring this for download  on each visit  Detailed instructions to be given by nurse educator  Balanced meals  Consider adding and will call if blood sugars continue to be poorly controlled  Check A1c on his next visit  He will be given a trial of Humalog with a free coupon  Patient instructions as follows:  Patient Instructions  LANTUS 34 UNITS IN AM, if am sugar still over 140 go up to 36  Novolog OR Humalog 18 at breakfast and 22 at dinner: MUST TAKE THIS 5-15 MIN BEFORE MEALS  Please check blood sugars at least half the time about 2 hours after any meal and times per week on waking up. Please bring blood sugar monitor to each visit    HYPERTENSION: His blood pressure is high normal today and he will followup with his PCP next month for this  Counseling time over 50% of today's 25 minute visit   Takeia Ciaravino 04/02/2014, 12:22 PM   Note: This office note was prepared with Estate agent. Any transcriptional errors that result from this process are unintentional.

## 2014-04-16 ENCOUNTER — Encounter: Payer: Self-pay | Admitting: Internal Medicine

## 2014-04-16 ENCOUNTER — Ambulatory Visit (INDEPENDENT_AMBULATORY_CARE_PROVIDER_SITE_OTHER): Payer: Commercial Managed Care - HMO | Admitting: Internal Medicine

## 2014-04-16 VITALS — BP 130/80 | HR 74 | Temp 98.6°F | Resp 20 | Ht 72.0 in | Wt 212.0 lb

## 2014-04-16 DIAGNOSIS — I10 Essential (primary) hypertension: Secondary | ICD-10-CM

## 2014-04-16 DIAGNOSIS — E119 Type 2 diabetes mellitus without complications: Secondary | ICD-10-CM

## 2014-04-16 DIAGNOSIS — E7800 Pure hypercholesterolemia, unspecified: Secondary | ICD-10-CM

## 2014-04-16 MED ORDER — COLCHICINE 0.6 MG PO TABS: ORAL_TABLET | ORAL | Status: DC

## 2014-04-16 MED ORDER — ACCU-CHEK SOFTCLIX LANCETS MISC: Status: DC

## 2014-04-16 NOTE — Progress Notes (Signed)
Subjective:    Patient ID: Patrick Wilcox, male    DOB: 04-10-1946, 68 y.o.   MRN: [id number]  HPI 68 year old patient who is seen today in followup.  He has type 2 diabetes and is now being followed by endocrinology.  He is on basal bolus insulin and has had a recent titration.  Generally feels well today.  He is scheduled for endocrinology followup in 2 weeks and is also scheduled for dietary consultation.  Lab Results  Component Value Date   HGBA1C 9.6* 01/14/2014    Wt Readings from Last 3 Encounters:  04/16/14 212 lb (96.163 kg)  04/02/14 211 lb (95.709 kg)  02/06/14 212 lb 6.4 oz (96.344 kg)   He has had a recent eye examination last month. He has treated hypertension, as well as CAD and prior MI.  He He remains on statin therapy, which he continues to tolerate well .  No chest pain  He has a history of gout.  Allopurinol has been discontinued due to a rash.  He remains on colchicine.  Past Medical History  Diagnosis Date  . CORONARY ARTERY DISEASE 11/23/2006  . DIABETES MELLITUS, TYPE II 11/23/2006  . GOUT 05/20/2008  . HYPERLIPIDEMIA 11/23/2006  . HYPERTENSION 11/23/2006  . 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 PNEUMONIA, LEFT LOWER LOBE 09/08/2009    History   Social History  . Marital Status: Single    Spouse Name: N/A    Number of Children: N/A  . Years of Education: N/A   Occupational History  . Not on file.   Social History Main Topics  . Smoking status: Never Smoker   . Smokeless tobacco: Never Used  . Alcohol Use: No  . Drug Use: No  . Sexual Activity: Not on file   Other Topics Concern  . Not on file   Social History Narrative  . No narrative on file    Past Surgical History  Procedure Laterality Date  . Coronary angioplasty with stent placement    . Coronary artery bypass graft  2003    Family History  Problem Relation Age of Onset  . Heart disease Mother   . Heart disease Father   . Diabetes Sister   . Diabetes Brother     No Known  Allergies  Current Outpatient Prescriptions on File Prior to Visit  Medication Sig Dispense Refill  . Alcohol Swabs (B-D SINGLE USE SWABS REGULAR) PADS Use as necessary  300 each  6  . allopurinol (ZYLOPRIM) 300 MG tablet take 1 tablet by mouth once daily  90 tablet  3  . amLODipine (NORVASC) 10 MG tablet TAKE ONE TABLET BY MOUTH ONCE DAILY  90 tablet  0  . aspirin 81 MG tablet Take 81 mg by mouth daily.      Marland Kitchen atorvastatin (LIPITOR) 40 MG tablet TAKE ONE TABLET BY MOUTH ONCE DAILY  90 tablet  0  . furosemide (LASIX) 20 MG tablet TAKE 1 TABLET BY MOUTH DAILY  90 tablet  3  . glucose blood (ACCU-CHEK AVIVA PLUS) test strip Use as instructed to check blood sugar 3 times per day dx code 250.00  100 each  3  . insulin aspart (NOVOLOG FLEXPEN) 100 UNIT/ML FlexPen Inject 15 units into skin prior to breakfast and 20 to 25 units prior to evening meal  15 pen  1  . insulin lispro (HUMALOG KWIKPEN) 100 UNIT/ML KiwkPen 18 units at breakfast and 22 at dinner  15 mL  1  . Insulin Pen  Needle 32G X 4 MM MISC 1 each by Does not apply route 3 (three) times daily as needed.  100 each  12  . LANTUS SOLOSTAR 100 UNIT/ML Solostar Pen INJECT 30 UNITS SUBCUTANEOUSLY AT BEDTIME  15 pen  3  . metFORMIN (GLUCOPHAGE) 1000 MG tablet Take 1 tablet (1,000 mg total) by mouth 2 (two) times daily with a meal.  180 tablet  1  . metoprolol (LOPRESSOR) 100 MG tablet Take 1 tablet (100 mg total) by mouth 2 (two) times daily.  180 tablet  5  . nystatin-triamcinolone ointment (MYCOLOG) Apply topically 2 (two) times daily.  30 g  0  . ramipril (ALTACE) 10 MG capsule TAKE ONE CAPSULE BY MOUTH ONCE DAILY.  90 capsule  0  . vardenafil (LEVITRA) 20 MG tablet Take 20 mg by mouth daily as needed.       No current facility-administered medications on file prior to visit.    BP 130/80  Pulse 74  Temp(Src) 98.6 F (37 C) (Oral)  Resp 20  Ht 6' (1.829 m)  Wt 212 lb (96.163 kg)  BMI 28.75 kg/m2  SpO2 98%      Review of Systems    Constitutional: Negative for fever, chills, appetite change and fatigue.  HENT: Negative for congestion, dental problem, ear pain, hearing loss, sore throat, tinnitus, trouble swallowing and voice change.   Eyes: Negative for pain, discharge and visual disturbance.  Respiratory: Negative for cough, chest tightness, wheezing and stridor.   Cardiovascular: Negative for chest pain, palpitations and leg swelling.  Gastrointestinal: Negative for nausea, vomiting, abdominal pain, diarrhea, constipation, blood in stool and abdominal distention.  Genitourinary: Negative for urgency, hematuria, flank pain, discharge, difficulty urinating and genital sores.  Musculoskeletal: Negative for arthralgias, back pain, gait problem, joint swelling, myalgias and neck stiffness.  Skin: Negative for rash.  Neurological: Negative for dizziness, syncope, speech difficulty, weakness, numbness and headaches.  Hematological: Negative for adenopathy. Does not bruise/bleed easily.  Psychiatric/Behavioral: Negative for behavioral problems and dysphoric mood. The patient is not nervous/anxious.        Objective:   Physical Exam  Constitutional: He is oriented to person, place, and time. He appears well-developed.  HENT:  Head: Normocephalic.  Right Ear: External ear normal.  Left Ear: External ear normal.  Eyes: Conjunctivae and EOM are normal.  Neck: Normal range of motion.  Cardiovascular: Normal rate and normal heart sounds.   Pulmonary/Chest: Breath sounds normal.  Abdominal: Bowel sounds are normal.  Musculoskeletal: Normal range of motion. He exhibits no edema and no tenderness.  Neurological: He is alert and oriented to person, place, and time.  Psychiatric: He has a normal mood and affect. His behavior is normal.          Assessment & Plan:  Diabetes mellitus.  Followup in endocrinology in 2 weeks as scheduled Hypertension controlled Dyslipidemia.  Continue statin therapy CAD.  Continue  aggressive risk factor modification History of gout, stable  Recheck 6 months

## 2014-04-16 NOTE — Progress Notes (Signed)
Pre visit review using our clinic review tool, if applicable. No additional management support is needed unless otherwise documented below in the visit note. 

## 2014-04-16 NOTE — Patient Instructions (Signed)
Please check your hemoglobin A1c every 3 months  Limit your sodium (Salt) intake    It is important that you exercise regularly, at least 20 minutes 3 to 4 times per week.  If you develop chest pain or shortness of breath seek  medical attention.  You need to lose weight.  Consider a lower calorie diet and regular exercise.  Return in 6 months for follow-up

## 2014-04-23 ENCOUNTER — Other Ambulatory Visit: Payer: Self-pay | Admitting: Internal Medicine

## 2014-04-25 ENCOUNTER — Other Ambulatory Visit: Payer: Medicare HMO

## 2014-04-30 ENCOUNTER — Ambulatory Visit: Payer: Medicare HMO | Admitting: Nutrition

## 2014-04-30 ENCOUNTER — Ambulatory Visit: Payer: Medicare HMO | Admitting: Endocrinology

## 2014-05-22 ENCOUNTER — Other Ambulatory Visit (INDEPENDENT_AMBULATORY_CARE_PROVIDER_SITE_OTHER): Payer: Commercial Managed Care - HMO

## 2014-05-22 ENCOUNTER — Other Ambulatory Visit: Payer: Self-pay | Admitting: *Deleted

## 2014-05-22 DIAGNOSIS — E119 Type 2 diabetes mellitus without complications: Secondary | ICD-10-CM

## 2014-05-22 LAB — LIPID PANEL
Cholesterol: 102 mg/dL (ref 0–200)
HDL: 26.4 mg/dL — ABNORMAL LOW (ref >39.00)
LDL Cholesterol: 58 mg/dL (ref 0–99)
NonHDL: 75.6
Total CHOL/HDL Ratio: 4
Triglycerides: 89 mg/dL (ref 0.0–149.0)
VLDL: 17.8 mg/dL (ref 0.0–40.0)

## 2014-05-22 LAB — COMPREHENSIVE METABOLIC PANEL
ALT: 40 U/L (ref 0–53)
AST: 48 U/L — ABNORMAL HIGH (ref 0–37)
Albumin: 3.5 g/dL (ref 3.5–5.2)
Alkaline Phosphatase: 122 U/L — ABNORMAL HIGH (ref 39–117)
BUN: 12 mg/dL (ref 6–23)
CO2: 19 mEq/L (ref 19–32)
Calcium: 9.5 mg/dL (ref 8.4–10.5)
Chloride: 103 mEq/L (ref 96–112)
Creatinine, Ser: 0.8 mg/dL (ref 0.4–1.5)
GFR: 132.95 mL/min (ref >60.00)
Glucose, Bld: 322 mg/dL — ABNORMAL HIGH (ref 70–99)
Potassium: 4.1 mEq/L (ref 3.5–5.1)
Sodium: 136 mEq/L (ref 135–145)
Total Bilirubin: 0.7 mg/dL (ref 0.2–1.2)
Total Protein: 7.6 g/dL (ref 6.0–8.3)

## 2014-05-22 LAB — HEMOGLOBIN A1C: Hgb A1c MFr Bld: 9.4 % — ABNORMAL HIGH (ref 4.6–6.5)

## 2014-05-28 ENCOUNTER — Ambulatory Visit (INDEPENDENT_AMBULATORY_CARE_PROVIDER_SITE_OTHER): Payer: Commercial Managed Care - HMO | Admitting: Endocrinology

## 2014-05-28 ENCOUNTER — Encounter: Payer: Self-pay | Admitting: Endocrinology

## 2014-05-28 VITALS — BP 130/90 | HR 86 | Temp 98.4°F | Ht 72.0 in | Wt 209.0 lb

## 2014-05-28 DIAGNOSIS — E1165 Type 2 diabetes mellitus with hyperglycemia: Secondary | ICD-10-CM

## 2014-05-28 DIAGNOSIS — I10 Essential (primary) hypertension: Secondary | ICD-10-CM

## 2014-05-28 DIAGNOSIS — IMO0002 Reserved for concepts with insufficient information to code with codable children: Secondary | ICD-10-CM

## 2014-05-28 NOTE — Progress Notes (Signed)
Patient ID: Patrick Wilcox, male   DOB: 03/12/1946, 68 y.o.   MRN: [id number]    Reason for Appointment: Followup for Type 2 Diabetes  Referring physician: Burnice Logan  History of Present Illness:          Diagnosis: Type 2 diabetes mellitus, date of diagnosis: 2004       Past history:  At diagnosis he was having symptoms of drowsiness, difficulty breathing and blood sugar was reportedly over 600 He was initially treated with insulin but subsequently switched back to oral drugs He may have taken metformin and other medications for some time before going back on insulin His records indicate that he had been taking Amaryl and Januvia before going back on insulin in mid-2014 when his A1c had gone up over 13 Initially was given premixed insulin and then switched to Lantus and Humalog Record of his A1c indicates that his levels have been over 8% since 02/2012  Recent history:  His blood sugars have been difficult to control with his A1c consistently over 9% He has consistent difficulty following instructions given for his insulin doses in writing He was told to try Lantus in the morning and increase the dose but is still taking this at night Also apparently he had not received a supply of insulin for several days and glucose was over 300 in the lab Mealtime insulin: he is still taking this after eating despite instructions to take it a few minutes before Taking this twice a day as he usually does not eat lunch Glucose monitoring: He is still checking his blood sugar 10-15 minutes after his evening meal instead of before eating or 2 hours later His monitor has the time about 2 hours late programmed on it However with starting back on his insulin doses as directed his blood sugars are improving although inconsistent; still relatively high in the morning fasting No hypoglycemia with current regimen Currently is trying to keep up with his walking and has slight improvement in his weight        Oral hypoglycemic drugs the patient is taking are: Metformin 1 g twice a day      INSULIN regimen is described as:  Lantus 34 hs, Humalog 15 after breakfast and 25 after supper   Compliance with the medical regimen: Good  Hypoglycemia: None    Glucose monitoring: Checking 1-2 times a day        Glucometer:  Accu-Chek   Blood Glucose readings  from  download  PREMEAL Breakfast Lunch Dinner Bedtime Overall  Glucose range: 130-285  94-220 185   Mean/median:     173   Glycemic control:   Lab Results  Component Value Date   HGBA1C 9.4* 05/22/2014   HGBA1C 9.6* 01/14/2014   HGBA1C 11.4* 08/02/2013   Lab Results  Component Value Date   MICROALBUR 3.3* 02/06/2014   LDLCALC 58 05/22/2014   CREATININE 0.8 05/22/2014    Retinal exam: Most recent:.7/15   Self-care: The diet that the patient has been following is: tries to limit fats.he has seen the dietitian and 2014       Meals: 2 meals per day. Breakfast is cereal or eggs, some sweets, has fruit for snacks. Evening meal usually vegetables, tuna salad, chicken.          Exercise:  walking 3/7 days a week, usually 45 minutes in the morning         Dietician visit: Most recent: 2014.  Weight history: Wt Readings from Last 3 Encounters:  05/28/14 209 lb (94.802 kg)  04/16/14 212 lb (96.163 kg)  04/02/14 211 lb (95.709 kg)       Medication List       This list is accurate as of: 05/28/14 11:59 PM.  Always use your most recent med list.               ACCU-CHEK SOFTCLIX LANCETS lancets  Check blood sugars three times a day.     allopurinol 300 MG tablet  Commonly known as:  ZYLOPRIM  take 1 tablet by mouth once daily     amLODipine 10 MG tablet  Commonly known as:  NORVASC  TAKE ONE TABLET BY MOUTH ONCE DAILY     aspirin 81 MG tablet  Take 81 mg by mouth daily.     atorvastatin 40 MG tablet  Commonly known as:  LIPITOR  TAKE ONE TABLET BY MOUTH ONCE DAILY     B-D SINGLE USE SWABS REGULAR Pads  Use  as necessary     colchicine 0.6 MG tablet  Commonly known as:  COLCRYS  take 1 tablet by mouth once daily     furosemide 20 MG tablet  Commonly known as:  LASIX  TAKE 1 TABLET BY MOUTH DAILY     glucose blood test strip  Commonly known as:  ACCU-CHEK AVIVA PLUS  Use as instructed to check blood sugar 3 times per day dx code 250.00     insulin aspart 100 UNIT/ML FlexPen  Commonly known as:  NOVOLOG FLEXPEN  Inject 15 units into skin prior to breakfast and 20 to 25 units prior to evening meal     insulin lispro 100 UNIT/ML KiwkPen  Commonly known as:  HUMALOG KWIKPEN  18 units at breakfast and 22 at dinner     Insulin Pen Needle 32G X 4 MM Misc  1 each by Does not apply route 3 (three) times daily as needed.     LANTUS SOLOSTAR 100 UNIT/ML Solostar Pen  Generic drug:  Insulin Glargine  INJECT 30 UNITS SUBCUTANEOUSLY AT BEDTIME     metFORMIN 1000 MG tablet  Commonly known as:  GLUCOPHAGE  TAKE ONE TABLET BY MOUTH TWICE DAILY. TAKE WITH A MEAL.     metoprolol 100 MG tablet  Commonly known as:  LOPRESSOR  Take 1 tablet (100 mg total) by mouth 2 (two) times daily.     nystatin-triamcinolone ointment  Commonly known as:  MYCOLOG  Apply topically 2 (two) times daily.     ramipril 10 MG capsule  Commonly known as:  ALTACE  TAKE ONE CAPSULE BY MOUTH ONCE DAILY.     vardenafil 20 MG tablet  Commonly known as:  LEVITRA  Take 20 mg by mouth daily as needed.        Allergies:  Allergies  Allergen Reactions  . Allopurinol     Rash    Past Medical History  Diagnosis Date  . CORONARY ARTERY DISEASE 11/23/2006  . DIABETES MELLITUS, TYPE II 11/23/2006  . GOUT 05/20/2008  . HYPERLIPIDEMIA 11/23/2006  . HYPERTENSION 11/23/2006  . 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 PNEUMONIA, LEFT LOWER LOBE 09/08/2009    Past Surgical History  Procedure Laterality Date  . Coronary angioplasty with stent placement    . Coronary artery bypass graft  2003    Family History   Problem Relation Age of Onset  . Heart disease Mother   . Heart disease Father   .  Diabetes Sister   . Diabetes Brother     Social History:  reports that he has never smoked. He has never used smokeless tobacco. He reports that he does not drink alcohol or use illicit drugs.    Review of Systems    last eye exam: 7/15      Lipids: Adequately treated with Lipitor, this was started after his MI       Lab Results  Component Value Date   CHOL 102 05/22/2014   HDL 26.40* 05/22/2014   LDLCALC 58 05/22/2014   TRIG 89.0 05/22/2014   CHOLHDL 4 05/22/2014       The blood pressure has been treated with Norvasc, ramipril and metoprolol with variable control     No recent swelling of feet. Is on Lasix 20 mg  Complications: No evidence of neuropathy or nephropathy      LABS:  Appointment on 05/22/2014  Component Date Value Ref Range Status  . Sodium 05/22/2014 136  135 - 145 mEq/L Final  . Potassium 05/22/2014 4.1  3.5 - 5.1 mEq/L Final  . Chloride 05/22/2014 103  96 - 112 mEq/L Final  . CO2 05/22/2014 19  19 - 32 mEq/L Final  . Glucose, Bld 05/22/2014 322* 70 - 99 mg/dL Final  . BUN 05/22/2014 12  6 - 23 mg/dL Final  . Creatinine, Ser 05/22/2014 0.8  0.4 - 1.5 mg/dL Final  . Total Bilirubin 05/22/2014 0.7  0.2 - 1.2 mg/dL Final  . Alkaline Phosphatase 05/22/2014 122* 39 - 117 U/L Final  . AST 05/22/2014 48* 0 - 37 U/L Final  . ALT 05/22/2014 40  0 - 53 U/L Final  . Total Protein 05/22/2014 7.6  6.0 - 8.3 g/dL Final  . Albumin 05/22/2014 3.5  3.5 - 5.2 g/dL Final  . Calcium 05/22/2014 9.5  8.4 - 10.5 mg/dL Final  . GFR 05/22/2014 132.95  >60.00 mL/min Final  . Cholesterol 05/22/2014 102  0 - 200 mg/dL Final   ATP III Classification       Desirable:  < 200 mg/dL               Borderline High:  200 - 239 mg/dL          High:  > = 240 mg/dL  . Triglycerides 05/22/2014 89.0  0.0 - 149.0 mg/dL Final   Normal:  <150 mg/dLBorderline High:  150 - 199 mg/dL  . HDL 05/22/2014 26.40*  >39.00 mg/dL Final  . VLDL 05/22/2014 17.8  0.0 - 40.0 mg/dL Final  . LDL Cholesterol 05/22/2014 58  0 - 99 mg/dL Final  . Total CHOL/HDL Ratio 05/22/2014 4   Final                  Men          Women1/2 Average Risk     3.4          3.3Average Risk          5.0          4.42X Average Risk          9.6          7.13X Average Risk          15.0          11.0                      . NonHDL 05/22/2014 75.60   Final   NOTE:  Non-HDL goal  should be 30 mg/dL higher than patient's LDL goal (i.e. LDL goal of < 70 mg/dL, would have non-HDL goal of < 100 mg/dL)  . Hgb A1c MFr Bld 05/22/2014 9.4* 4.6 - 6.5 % Final   Glycemic Control Guidelines for People with Diabetes:Non Diabetic:  <6%Goal of Therapy: <7%Additional Action Suggested:  >8%     Physical Examination:  BP 130/90 mmHg  Pulse 86  Temp(Src) 98.4 F (36.9 C) (Oral)  Ht 6' (1.829 m)  Wt 209 lb (94.802 kg)  BMI 28.34 kg/m2  SpO2 96%  No pedal edema present       ASSESSMENT/PLAN  Diabetes type 2, uncontrolled   See history of present illness for detailed discussion of current management, problems identified and blood sugar patterns He this finally taking his insulin doses as directed and has no difficulty with affording the insulin, both mealtime and basal insulin His A1c was still significant high but his blood sugars have improved only in the last 2 weeks when he has taken his insulin consistently As discussed above he does need to take his mealtime insulin before eating rather than after Also discussed timing of glucose monitoring which he does not understand Again written instructions given in detail and explained as well as highlighted all the instructions on the after visit summary  Have increased Lantus up by 2 units and asked him to adjust it further based on blood sugar trend in the morning Discussed that NovoLog will be adjusted based on 2 hour postprandial blood sugar with the target of at least under 180 He will review his  progress with the nurse educator in 3 weeks and also get more detailed diabetes education  Patient instructions as follows:  Patient Instructions  LANTUS 36 UNITS, if am sugar still over 140 go up to 36   Novolog 15 before breakfast and 22 at dinner: MUST TAKE THIS 5-15 MIN BEFORE MEALS  Please check blood sugars at least half the time about 2 hours after any meal and 3-4 times per week on waking up. Please bring blood sugar monitor to each visit     HYPERTENSION: His blood pressure is high  today and he will followup with his PCP as before, apparently blood pressure was fairly good with PCP recently  Counseling time over 50% of today's 25 minute visit   Prabhjot Maddux 05/29/2014, 9:34 AM   Note: This office note was prepared with Estate agent. Any transcriptional errors that result from this process are unintentional.

## 2014-05-28 NOTE — Patient Instructions (Signed)
LANTUS 36 UNITS, if am sugar still over 140 go up to 36   Novolog 15 before breakfast and 22 at dinner: MUST TAKE THIS 5-15 MIN BEFORE MEALS  Please check blood sugars at least half the time about 2 hours after any meal and 3-4 times per week on waking up. Please bring blood sugar monitor to each visit

## 2014-06-25 ENCOUNTER — Encounter: Payer: Medicare HMO | Attending: Endocrinology | Admitting: Nutrition

## 2014-06-25 DIAGNOSIS — E119 Type 2 diabetes mellitus without complications: Secondary | ICD-10-CM

## 2014-06-25 DIAGNOSIS — Z713 Dietary counseling and surveillance: Secondary | ICD-10-CM | POA: Diagnosis not present

## 2014-06-25 DIAGNOSIS — Z794 Long term (current) use of insulin: Secondary | ICD-10-CM | POA: Diagnosis not present

## 2014-06-25 NOTE — Progress Notes (Signed)
Patient brought a record book of his blood sugars in.  Testing acB and acS.  FBSs last 7 days: 151, 168, 156, 114, 114, 157, 160.  ACS: 127, 91, 182, 147, 183, 264 (ate a desert with lunch)  The patient is taking Lantus: 34u, Novolog 15u if blood sugar is high, or if eating a bigger meal. *Patient is snacking at HS when taking his Lantus, because he was told to have a bedtime snack every night.  He does not want this and is forcing himself to eat 20-40 grams of carbs.  He will stop this and call me his FBSs on Monday.  He will stay at 34u of Lantus.

## 2014-06-25 NOTE — Patient Instructions (Signed)
Continue to test blood sugars before breakfast and supper. Stop HS snacks Call blood sugars to me on Monday: [phone]

## 2014-07-02 ENCOUNTER — Other Ambulatory Visit: Payer: Self-pay | Admitting: *Deleted

## 2014-07-23 ENCOUNTER — Other Ambulatory Visit: Payer: Medicare HMO

## 2014-07-28 ENCOUNTER — Ambulatory Visit: Payer: Medicare HMO | Admitting: Endocrinology

## 2014-07-31 ENCOUNTER — Other Ambulatory Visit: Payer: Self-pay | Admitting: *Deleted

## 2014-07-31 ENCOUNTER — Other Ambulatory Visit: Payer: Self-pay

## 2014-07-31 MED ORDER — INSULIN PEN NEEDLE 32G X 4 MM MISC
Status: DC
Start: 2014-07-31 — End: 2015-02-23

## 2014-07-31 MED ORDER — ALLOPURINOL 300 MG PO TABS: ORAL_TABLET | ORAL | Status: DC

## 2014-07-31 MED ORDER — METOPROLOL TARTRATE 100 MG PO TABS: 100.0000 mg | ORAL_TABLET | Freq: Two times a day (BID) | ORAL | Status: DC

## 2014-07-31 NOTE — Telephone Encounter (Signed)
Rx request for allopurinol 300 mg- Take 1 tablet daily #90  Request for Lopressor 100 mg-Take 1 tablet by mouth bid.  Pharm:  [facility]  Rx sent to pharmacy.

## 2014-08-04 ENCOUNTER — Ambulatory Visit (INDEPENDENT_AMBULATORY_CARE_PROVIDER_SITE_OTHER): Payer: Commercial Managed Care - HMO | Admitting: Internal Medicine

## 2014-08-04 ENCOUNTER — Other Ambulatory Visit (INDEPENDENT_AMBULATORY_CARE_PROVIDER_SITE_OTHER): Payer: Commercial Managed Care - HMO

## 2014-08-04 ENCOUNTER — Encounter: Payer: Self-pay | Admitting: Internal Medicine

## 2014-08-04 VITALS — BP 130/80 | HR 92 | Temp 97.7°F | Resp 20 | Ht 72.0 in | Wt 204.0 lb

## 2014-08-04 DIAGNOSIS — J069 Acute upper respiratory infection, unspecified: Secondary | ICD-10-CM | POA: Diagnosis not present

## 2014-08-04 DIAGNOSIS — E1165 Type 2 diabetes mellitus with hyperglycemia: Secondary | ICD-10-CM

## 2014-08-04 DIAGNOSIS — I10 Essential (primary) hypertension: Secondary | ICD-10-CM | POA: Diagnosis not present

## 2014-08-04 DIAGNOSIS — IMO0002 Reserved for concepts with insufficient information to code with codable children: Secondary | ICD-10-CM

## 2014-08-04 DIAGNOSIS — B9789 Other viral agents as the cause of diseases classified elsewhere: Secondary | ICD-10-CM

## 2014-08-04 LAB — BASIC METABOLIC PANEL
BUN: 18 mg/dL (ref 6–23)
CO2: 26 mEq/L (ref 19–32)
Calcium: 10.1 mg/dL (ref 8.4–10.5)
Chloride: 100 mEq/L (ref 96–112)
Creatinine, Ser: 0.91 mg/dL (ref 0.40–1.50)
GFR: 106.3 mL/min (ref >60.00)
Glucose, Bld: 126 mg/dL — ABNORMAL HIGH (ref 70–99)
Potassium: 4.4 mEq/L (ref 3.5–5.1)
Sodium: 136 mEq/L (ref 135–145)

## 2014-08-04 LAB — HEMOGLOBIN A1C: Hgb A1c MFr Bld: 7.8 % — ABNORMAL HIGH (ref 4.6–6.5)

## 2014-08-04 MED ORDER — HYDROCODONE-HOMATROPINE 5-1.5 MG/5ML PO SYRP: 5.0000 mL | ORAL_SOLUTION | Freq: Four times a day (QID) | ORAL | Status: DC | PRN

## 2014-08-04 NOTE — Progress Notes (Signed)
Subjective:    Patient ID: Patrick Wilcox, male    DOB: 24-Feb-1946, 69 y.o.   MRN: [id number]  HPI  69 year old patient who has hypertension, dyslipidemia and coronary artery disease.  He is followed by endocrinology for type 2 diabetes.  For the past 2 weeks he has had sore throat, cough with sinus and chest congestion.  Cough has been productive of slightly yellow sputum.  There has been perhaps intermittent low-grade fever.  His chief complaints are, sore throat and cough  Past Medical History  Diagnosis Date  . CORONARY ARTERY DISEASE 11/23/2006  . DIABETES MELLITUS, TYPE II 11/23/2006  . GOUT 05/20/2008  . HYPERLIPIDEMIA 11/23/2006  . HYPERTENSION 11/23/2006  . 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 PNEUMONIA, LEFT LOWER LOBE 09/08/2009    History   Social History  . Marital Status: Single    Spouse Name: N/A    Number of Children: N/A  . Years of Education: N/A   Occupational History  . Not on file.   Social History Main Topics  . Smoking status: Never Smoker   . Smokeless tobacco: Never Used  . Alcohol Use: No  . Drug Use: No  . Sexual Activity: Not on file   Other Topics Concern  . Not on file   Social History Narrative    Past Surgical History  Procedure Laterality Date  . Coronary angioplasty with stent placement    . Coronary artery bypass graft  2003    Family History  Problem Relation Age of Onset  . Heart disease Mother   . Heart disease Father   . Diabetes Sister   . Diabetes Brother     Allergies  Allergen Reactions  . Allopurinol     Rash    Current Outpatient Prescriptions on File Prior to Visit  Medication Sig Dispense Refill  . ACCU-CHEK SOFTCLIX LANCETS lancets Check blood sugars three times a day. 100 each 12  . Alcohol Swabs (B-D SINGLE USE SWABS REGULAR) PADS Use as necessary 300 each 6  . allopurinol (ZYLOPRIM) 300 MG tablet take 1 tablet by mouth once daily 90 tablet 1  . amLODipine (NORVASC) 10 MG tablet TAKE ONE TABLET BY  MOUTH ONCE DAILY 90 tablet 0  . aspirin 81 MG tablet Take 81 mg by mouth daily.    Marland Kitchen atorvastatin (LIPITOR) 40 MG tablet TAKE ONE TABLET BY MOUTH ONCE DAILY 90 tablet 0  . colchicine (COLCRYS) 0.6 MG tablet take 1 tablet by mouth once daily 90 tablet 3  . furosemide (LASIX) 20 MG tablet TAKE 1 TABLET BY MOUTH DAILY 90 tablet 3  . glucose blood (ACCU-CHEK AVIVA PLUS) test strip Use as instructed to check blood sugar 3 times per day dx code 250.00 100 each 3  . insulin aspart (NOVOLOG FLEXPEN) 100 UNIT/ML FlexPen Inject 15 units into skin prior to breakfast and 20 to 25 units prior to evening meal (Patient taking differently: Inject 20 units into skin prior to breakfast and 20 units prior to evening meal) 15 pen 1  . Insulin Pen Needle (BD PEN NEEDLE NANO U/F) 32G X 4 MM MISC Use to inject insulin 3 times daily as instructed. 100 each 11  . Insulin Pen Needle 32G X 4 MM MISC 1 each by Does not apply route 3 (three) times daily as needed. 100 each 12  . LANTUS SOLOSTAR 100 UNIT/ML Solostar Pen INJECT 30 UNITS SUBCUTANEOUSLY AT BEDTIME (Patient taking differently: INJECT 34 UNITS SUBCUTANEOUSLY AT BEDTIME) 15 pen 3  .  metFORMIN (GLUCOPHAGE) 1000 MG tablet TAKE ONE TABLET BY MOUTH TWICE DAILY. TAKE WITH A MEAL. 180 tablet 1  . metoprolol (LOPRESSOR) 100 MG tablet Take 1 tablet (100 mg total) by mouth 2 (two) times daily. 180 tablet 1  . nystatin-triamcinolone ointment (MYCOLOG) Apply topically 2 (two) times daily. 30 g 0  . ramipril (ALTACE) 10 MG capsule TAKE ONE CAPSULE BY MOUTH ONCE DAILY. 90 capsule 0  . vardenafil (LEVITRA) 20 MG tablet Take 20 mg by mouth daily as needed.     No current facility-administered medications on file prior to visit.    BP 130/80 mmHg  Pulse 92  Temp(Src) 97.7 F (36.5 C) (Oral)  Resp 20  Ht 6' (1.829 m)  Wt 204 lb (92.534 kg)  BMI 27.66 kg/m2  SpO2 98%     Review of Systems  Constitutional: Positive for appetite change and fatigue. Negative for fever and  chills.  HENT: Positive for congestion, postnasal drip, rhinorrhea and sinus pressure. Negative for dental problem, ear pain, hearing loss, sore throat, tinnitus, trouble swallowing and voice change.   Eyes: Negative for pain, discharge and visual disturbance.  Respiratory: Positive for cough. Negative for chest tightness, wheezing and stridor.   Cardiovascular: Negative for chest pain, palpitations and leg swelling.  Gastrointestinal: Negative for nausea, vomiting, abdominal pain, diarrhea, constipation, blood in stool and abdominal distention.  Genitourinary: Negative for urgency, hematuria, flank pain, discharge, difficulty urinating and genital sores.  Musculoskeletal: Negative for myalgias, back pain, joint swelling, arthralgias, gait problem and neck stiffness.  Skin: Negative for rash.  Neurological: Negative for dizziness, syncope, speech difficulty, weakness, numbness and headaches.  Hematological: Negative for adenopathy. Does not bruise/bleed easily.  Psychiatric/Behavioral: Negative for behavioral problems and dysphoric mood. The patient is not nervous/anxious.        Objective:   Physical Exam  Constitutional: He is oriented to person, place, and time. He appears well-developed.  HENT:  Head: Normocephalic.  Right Ear: External ear normal.  Left Ear: External ear normal.  Erythema of the oropharynx without exudate  Eyes: Conjunctivae and EOM are normal.  Neck: Normal range of motion.  Cardiovascular: Normal rate and normal heart sounds.   Pulmonary/Chest: Breath sounds normal. No respiratory distress. He has no wheezes. He has no rales.  Abdominal: Bowel sounds are normal.  Musculoskeletal: Normal range of motion. He exhibits no edema or tenderness.  Lymphadenopathy:    He has no cervical adenopathy.  Neurological: He is alert and oriented to person, place, and time.  Psychiatric: He has a normal mood and affect. His behavior is normal.          Assessment & Plan:     Viral URI with cough.  Will treat symptomatically Hypertension, well-controlled Diabetes mellitus.  Follow-up endocrinology

## 2014-08-04 NOTE — Patient Instructions (Signed)
Acute bronchitis symptoms for less than 10 days are generally not helped by antibiotics.  Take over-the-counter expectorants and cough medications such as  Mucinex DM.  Call if there is no improvement in 5 to 7 days or if  you develop worsening cough, fever, or new symptoms, such as shortness of breath or chest pain.  Tylenol 1-2 tabs po q4h prn   Use lozenges for sore throat pain

## 2014-08-04 NOTE — Progress Notes (Signed)
Pre visit review using our clinic review tool, if applicable. No additional management support is needed unless otherwise documented below in the visit note. 

## 2014-08-12 ENCOUNTER — Other Ambulatory Visit: Payer: Self-pay | Admitting: *Deleted

## 2014-08-12 ENCOUNTER — Ambulatory Visit (INDEPENDENT_AMBULATORY_CARE_PROVIDER_SITE_OTHER): Payer: Medicare Other | Admitting: Endocrinology

## 2014-08-12 ENCOUNTER — Encounter: Payer: Self-pay | Admitting: Endocrinology

## 2014-08-12 VITALS — BP 146/87 | HR 110 | Temp 98.0°F | Resp 14 | Ht 72.0 in | Wt 194.0 lb

## 2014-08-12 DIAGNOSIS — I10 Essential (primary) hypertension: Secondary | ICD-10-CM | POA: Diagnosis not present

## 2014-08-12 DIAGNOSIS — E1165 Type 2 diabetes mellitus with hyperglycemia: Secondary | ICD-10-CM | POA: Diagnosis not present

## 2014-08-12 DIAGNOSIS — IMO0002 Reserved for concepts with insufficient information to code with codable children: Secondary | ICD-10-CM

## 2014-08-12 MED ORDER — INSULIN ASPART 100 UNIT/ML FLEXPEN: PEN_INJECTOR | SUBCUTANEOUS | Status: DC

## 2014-08-12 NOTE — Patient Instructions (Signed)
Please check blood sugars at least half the time about 2 hours after any meal and 3 times per week on waking up.  Please bring blood sugar monitor to each visit. Recommended blood sugar levels about 2 hours after meal is 140-180 and on waking up 90-130  TAKE NOVOLOG before each meal 20 units before eating breakfast and 25 at supper  Try to keep 2 hour reading after meals under 180   May switch to [facility] when out of accucheck

## 2014-08-12 NOTE — Progress Notes (Signed)
Patient ID: Patrick Wilcox, male   DOB: 04/02/1946, 69 y.o.   MRN: [id number]    Reason for Appointment: Followup for Type 2 Diabetes  Referring physician: Burnice Logan  History of Present Illness:          Diagnosis: Type 2 diabetes mellitus, date of diagnosis: 2004       Past history:  At diagnosis he was having symptoms of drowsiness, difficulty breathing and blood sugar was reportedly over 600 He was initially treated with insulin but subsequently switched back to oral drugs He may have taken metformin and other medications for some time before going back on insulin His records indicate that he had been taking Amaryl and Januvia before going back on insulin in mid-2014 when his A1c had gone up over 13 Initially was given premixed insulin and then switched to Lantus and Humalog Record of his A1c indicates that his levels have been over 8% since 02/2012  Recent history:  He appears to be more compliant with his insulin regimen after discussion with nurse educator in 12/15 Also his A1c has improved Recently has lost a significant amount of weight because of a respiratory infection causing dysphagia and stress He thinks he is taking both the Lantus and NovoLog as directed although has been out of his NovoLog for 1 day  Basal insulin: He is trying to take the Lantus in the morning now Fasting glucose with this has been mostly near normal although higher in the last couple of days and fluctuating earlier this month with his respiratory infection Mealtime insulin: he is trying to take this before eating and also not skipping the dose in the blood sugar is normal as instructed by nurse educator He still does not understand the need for checking his blood sugar about 2 hours after eating to help adjust insulin dose  Glucose monitoring: He is still checking his blood sugar mostly before breakfast and supper and not after eating He thinks he has difficulty getting enough blood from the finger  with the current meter Blood sugars appear to be more variable in the afternoon No hypoglycemia with current regimen  DIET: Has seen the dietitian in 2014       Meals: 2 meals per day 10 am and 5 pm. Breakfast is cereal mostly, sometimes eggs, some sweets like cake, has fruit for snacks.  Evening meal usually vegetables, tuna salad, chicken.   More recently has been eating very small portions because of difficulty swallowing       Oral hypoglycemic drugs the patient is taking are: Metformin 1 g twice a day      INSULIN regimen is described as:  Lantus 34 daily, Novolog 15 after breakfast and 25 after supper   Compliance with the medical regimen: Good  Hypoglycemia: None    Glucose monitoring: Checking 1-2 times a day        Glucometer:  Accu-Chek   Blood Glucose readings  from  Download  PRE-MEAL Breakfast Lunch Dinner Bedtime Overall  Glucose range:  80-210   115-210   97-204     Mean/median:  130    160    141     Glycemic control:   Lab Results  Component Value Date   HGBA1C 7.8* 08/04/2014   HGBA1C 9.4* 05/22/2014   HGBA1C 9.6* 01/14/2014   Lab Results  Component Value Date   MICROALBUR 3.3* 02/06/2014   LDLCALC 58 05/22/2014   CREATININE 0.91 08/04/2014    Retinal exam: Most recent:.7/15  Exercise:  walking 3/7 days a week, usually 45 minutes in the morning         Dietician visit: Most recent: 2014.               Weight history:  Wt Readings from Last 3 Encounters:  08/12/14 194 lb (87.998 kg)  08/04/14 204 lb (92.534 kg)  05/28/14 209 lb (94.802 kg)       Medication List       This list is accurate as of: 08/12/14 12:00 PM.  Always use your most recent med list.               ACCU-CHEK SOFTCLIX LANCETS lancets  Check blood sugars three times a day.     allopurinol 300 MG tablet  Commonly known as:  ZYLOPRIM  take 1 tablet by mouth once daily     amLODipine 10 MG tablet  Commonly known as:  NORVASC  TAKE ONE TABLET BY MOUTH ONCE DAILY      aspirin 81 MG tablet  Take 81 mg by mouth daily.     atorvastatin 40 MG tablet  Commonly known as:  LIPITOR  TAKE ONE TABLET BY MOUTH ONCE DAILY     B-D SINGLE USE SWABS REGULAR Pads  Use as necessary     colchicine 0.6 MG tablet  Commonly known as:  COLCRYS  take 1 tablet by mouth once daily     furosemide 20 MG tablet  Commonly known as:  LASIX  TAKE 1 TABLET BY MOUTH DAILY     glucose blood test strip  Commonly known as:  ACCU-CHEK AVIVA PLUS  Use as instructed to check blood sugar 3 times per day dx code 250.00     HYDROcodone-homatropine 5-1.5 MG/5ML syrup  Commonly known as:  HYCODAN  Take 5 mLs by mouth every 6 (six) hours as needed for cough.     insulin aspart 100 UNIT/ML FlexPen  Commonly known as:  NOVOLOG FLEXPEN  Inject 20 units into skin prior to breakfast and 20 units prior to evening meal     Insulin Pen Needle 32G X 4 MM Misc  1 each by Does not apply route 3 (three) times daily as needed.     Insulin Pen Needle 32G X 4 MM Misc  Commonly known as:  BD PEN NEEDLE NANO U/F  Use to inject insulin 3 times daily as instructed.     LANTUS SOLOSTAR 100 UNIT/ML Solostar Pen  Generic drug:  Insulin Glargine  INJECT 30 UNITS SUBCUTANEOUSLY AT BEDTIME     metFORMIN 1000 MG tablet  Commonly known as:  GLUCOPHAGE  TAKE ONE TABLET BY MOUTH TWICE DAILY. TAKE WITH A MEAL.     metoprolol 100 MG tablet  Commonly known as:  LOPRESSOR  Take 1 tablet (100 mg total) by mouth 2 (two) times daily.     nystatin-triamcinolone ointment  Commonly known as:  MYCOLOG  Apply topically 2 (two) times daily.     ramipril 10 MG capsule  Commonly known as:  ALTACE  TAKE ONE CAPSULE BY MOUTH ONCE DAILY.     vardenafil 20 MG tablet  Commonly known as:  LEVITRA  Take 20 mg by mouth daily as needed.        Allergies:  Allergies  Allergen Reactions  . Allopurinol     Rash    Past Medical History  Diagnosis Date  . CORONARY ARTERY DISEASE 11/23/2006  . DIABETES  MELLITUS, TYPE II 11/23/2006  . GOUT 05/20/2008  .  HYPERLIPIDEMIA 11/23/2006  . HYPERTENSION 11/23/2006  . 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 PNEUMONIA, LEFT LOWER LOBE 09/08/2009    Past Surgical History  Procedure Laterality Date  . Coronary angioplasty with stent placement    . Coronary artery bypass graft  2003    Family History  Problem Relation Age of Onset  . Heart disease Mother   . Heart disease Father   . Diabetes Sister   . Diabetes Brother     Social History:  reports that he has never smoked. He has never used smokeless tobacco. He reports that he does not drink alcohol or use illicit drugs.    Review of Systems       Lipids: Adequately treated with Lipitor, this was started after his MI       Lab Results  Component Value Date   CHOL 102 05/22/2014   HDL 26.40* 05/22/2014   LDLCALC 58 05/22/2014   TRIG 89.0 05/22/2014   CHOLHDL 4 05/22/2014       The blood pressure has been treated with Norvasc, ramipril and metoprolol with variable control  Is on Lasix 20 mg  Complications: No evidence of neuropathy or nephropathy      LABS:  No visits with results within 1 Week(s) from this visit. Latest known visit with results is:  Appointment on 08/04/2014  Component Date Value Ref Range Status  . Hgb A1c MFr Bld 08/04/2014 7.8* 4.6 - 6.5 % Final   Glycemic Control Guidelines for People with Diabetes:Non Diabetic:  <6%Goal of Therapy: <7%Additional Action Suggested:  >8%   . Sodium 08/04/2014 136  135 - 145 mEq/L Final  . Potassium 08/04/2014 4.4  3.5 - 5.1 mEq/L Final  . Chloride 08/04/2014 100  96 - 112 mEq/L Final  . CO2 08/04/2014 26  19 - 32 mEq/L Final  . Glucose, Bld 08/04/2014 126* 70 - 99 mg/dL Final  . BUN 08/04/2014 18  6 - 23 mg/dL Final  . Creatinine, Ser 08/04/2014 0.91  0.40 - 1.50 mg/dL Final  . Calcium 08/04/2014 10.1  8.4 - 10.5 mg/dL Final  . GFR 08/04/2014 106.30  >60.00 mL/min Final    Physical Examination:  BP 146/87 mmHg   Pulse 110  Temp(Src) 98 F (36.7 C)  Resp 14  Ht 6' (1.829 m)  Wt 194 lb (87.998 kg)  BMI 26.31 kg/m2  SpO2 96%  No pedal edema present       ASSESSMENT/PLAN  Diabetes type 2, uncontrolled   See history of present illness for detailed discussion of current management, problems identified and blood sugar patterns. His blood sugars are overall better and this may be related to improved compliance with his insulin and getting his supplies consistently Also has been taking NovoLog insulin before eating regardless of pre-meal blood sugar and not skipping the doses Also recently because of difficulty swallowing he has been eating very small portions He is still not understanding the need for checking blood sugars about 2 hours after meals to help adjust his mealtime dose and see the effect of various meals. His blood sugars are variable in the afternoon and relatively higher before supper; he thinks he does not eat any food between breakfast and lunch. He still gives variable responses on whether he is taking his Lantus in the morning or evening  Explained to the patient with a chart the effects of food on blood sugars and the action of rapid acting insulin Discussed blood sugar targets both before and after meals  Patient was given specific instructions on his insulin doses and glucose monitoring He also was recommended eating more balanced meals in the mornings and avoiding cereal especially if he has high readings after such meals He can also adjust his pre-meal insulin based on the amount of carbohydrate he is planning to eat especially at suppertime  Patient instructions as follows:  Patient Instructions  Please check blood sugars at least half the time about 2 hours after any meal and 3 times per week on waking up.  Please bring blood sugar monitor to each visit. Recommended blood sugar levels about 2 hours after meal is 140-180 and on waking up 90-130  TAKE NOVOLOG before each  meal 20 units before eating breakfast and 25 at supper  Try to keep 2 hour reading after meals under 180   May switch to [facility] when out of accucheck    HYPERTENSION: To follow-up with PCP again, not clear why he has mild tachycardia today  Counseling time over 50% of today's 25 minute visit   Shawntel Farnworth 08/12/2014, 12:00 PM   Note: This office note was prepared with Estate agent. Any transcriptional errors that result from this process are unintentional.

## 2014-09-11 ENCOUNTER — Telehealth: Payer: Self-pay | Admitting: Internal Medicine

## 2014-09-11 NOTE — Telephone Encounter (Signed)
Pt request refill of the following: amLODipine (NORVASC) 10 MG tablet, atorvastatin (LIPITOR) 40 MG tablet, furosemide (LASIX) 20 MG tablet,nystatin-triamcinolone ointment (MYCOLOG), ramipril (ALTACE) 10 MG capsule all should be 90 day supply   vardenafil (LEVITRA) 20 MG tablet pt request 40 mg instead of 20 and said if the pharmacy does not have this med he will take viagra   colchicine (COLCRYS) 0.6 MG tablet 30 day supply       Phamacy:  [facility] [address]

## 2014-09-12 MED ORDER — AMLODIPINE BESYLATE 10 MG PO TABS: 10.0000 mg | ORAL_TABLET | Freq: Every day | ORAL | Status: DC

## 2014-09-12 MED ORDER — ATORVASTATIN CALCIUM 40 MG PO TABS: 40.0000 mg | ORAL_TABLET | Freq: Every day | ORAL | Status: DC

## 2014-09-12 MED ORDER — COLCHICINE 0.6 MG PO TABS: ORAL_TABLET | ORAL | Status: DC

## 2014-09-12 MED ORDER — RAMIPRIL 10 MG PO CAPS: ORAL_CAPSULE | ORAL | Status: DC

## 2014-09-12 MED ORDER — SILDENAFIL CITRATE 50 MG PO TABS: 50.0000 mg | ORAL_TABLET | Freq: Every day | ORAL | Status: DC | PRN

## 2014-09-12 MED ORDER — NYSTATIN-TRIAMCINOLONE 100000-0.1 UNIT/GM-% EX OINT
TOPICAL_OINTMENT | Freq: Two times a day (BID) | CUTANEOUS | Status: DC
Start: 2014-09-12 — End: 2016-01-06

## 2014-09-12 MED ORDER — FUROSEMIDE 20 MG PO TABS: ORAL_TABLET | ORAL | Status: DC

## 2014-09-12 NOTE — Telephone Encounter (Signed)
ok 

## 2014-09-12 NOTE — Telephone Encounter (Signed)
Dr. Raliegh Ip, pt requesting Levitra be changed to 40 mg tablet instead of 20 mg. Please advise if okay?

## 2014-09-12 NOTE — Telephone Encounter (Signed)
Pt notified Rx for Viagra sent to pharmacy.

## 2014-09-12 NOTE — Telephone Encounter (Signed)
Spoke to pt, told him Levitra does not come in 40 mg, 20 mg a day is max dose. Pt verbalized understanding and asked to change to Viagra 50 mg tablet due to cost. Told him will check with Dr. Raliegh Ip and send Rx if okay. Pt verbalized understanding.

## 2014-09-12 NOTE — Telephone Encounter (Signed)
Dr. Raliegh Ip, pt would like you to order Viagra 50 mg tablet instead due to cost. Please advise if okay?

## 2014-09-12 NOTE — Telephone Encounter (Signed)
Dose range for Levitra is  5-20 mg  20 mg is the largest tablet size and is the maximum daily dose

## 2014-09-15 ENCOUNTER — Other Ambulatory Visit: Payer: Self-pay | Admitting: *Deleted

## 2014-09-15 ENCOUNTER — Telehealth: Payer: Self-pay | Admitting: Internal Medicine

## 2014-09-15 MED ORDER — SILDENAFIL CITRATE 20 MG PO TABS: 40.0000 mg | ORAL_TABLET | Freq: Three times a day (TID) | ORAL | Status: DC

## 2014-09-15 NOTE — Telephone Encounter (Signed)
Error - rx's sent on 03/03

## 2014-09-15 NOTE — Telephone Encounter (Signed)
Pt needs states he brought list of medications by on Thursday that he needed sent to the pharmacy.

## 2014-10-16 ENCOUNTER — Ambulatory Visit: Payer: Medicare HMO | Admitting: Internal Medicine

## 2014-11-06 ENCOUNTER — Other Ambulatory Visit (INDEPENDENT_AMBULATORY_CARE_PROVIDER_SITE_OTHER): Payer: Medicare Other

## 2014-11-06 DIAGNOSIS — IMO0002 Reserved for concepts with insufficient information to code with codable children: Secondary | ICD-10-CM

## 2014-11-06 DIAGNOSIS — E1165 Type 2 diabetes mellitus with hyperglycemia: Secondary | ICD-10-CM

## 2014-11-06 LAB — BASIC METABOLIC PANEL
BUN: 11 mg/dL (ref 6–23)
CO2: 31 mEq/L (ref 19–32)
Calcium: 9.9 mg/dL (ref 8.4–10.5)
Chloride: 103 mEq/L (ref 96–112)
Creatinine, Ser: 0.88 mg/dL (ref 0.40–1.50)
GFR: 110.41 mL/min (ref >60.00)
Glucose, Bld: 120 mg/dL — ABNORMAL HIGH (ref 70–99)
Potassium: 4.1 mEq/L (ref 3.5–5.1)
Sodium: 137 mEq/L (ref 135–145)

## 2014-11-06 LAB — HEMOGLOBIN A1C: Hgb A1c MFr Bld: 7.3 % — ABNORMAL HIGH (ref 4.6–6.5)

## 2014-11-10 ENCOUNTER — Ambulatory Visit: Payer: Commercial Managed Care - HMO | Admitting: Endocrinology

## 2014-11-11 ENCOUNTER — Encounter: Payer: Self-pay | Admitting: Endocrinology

## 2014-11-11 ENCOUNTER — Ambulatory Visit (INDEPENDENT_AMBULATORY_CARE_PROVIDER_SITE_OTHER): Payer: Medicare Other | Admitting: Endocrinology

## 2014-11-11 VITALS — BP 148/82 | HR 79 | Temp 97.9°F | Resp 16 | Ht 72.0 in | Wt 217.6 lb

## 2014-11-11 DIAGNOSIS — E1165 Type 2 diabetes mellitus with hyperglycemia: Secondary | ICD-10-CM | POA: Diagnosis not present

## 2014-11-11 DIAGNOSIS — I10 Essential (primary) hypertension: Secondary | ICD-10-CM | POA: Diagnosis not present

## 2014-11-11 DIAGNOSIS — IMO0002 Reserved for concepts with insufficient information to code with codable children: Secondary | ICD-10-CM

## 2014-11-11 NOTE — Progress Notes (Signed)
Patient ID: Patrick Wilcox, male   DOB: 1945/10/30, 69 y.o.   MRN: [id number]    Reason for Appointment: Followup for Type 2 Diabetes  Referring physician: Burnice Logan  History of Present Illness:          Diagnosis: Type 2 diabetes mellitus, date of diagnosis: 2004       Past history:  At diagnosis he was having symptoms of drowsiness, difficulty breathing and blood sugar was reportedly over 600 He was initially treated with insulin but subsequently switched back to oral drugs He may have taken metformin and other medications for some time before going back on insulin His records indicate that he had been taking Amaryl and Januvia before going back on insulin in mid-2014 when his A1c had gone up over 13 Initially was given premixed insulin and then switched to Lantus and Humalog Record of his A1c indicates that his levels have been over 8% since 02/2012  Recent history:  He appears to be more compliant with his insulin regimen  Recently his A1c has improved further, probably because of better compliance with insulin Recently has gained back a lot of weight but had previously lost several pounds  Basal insulin: He is able to take the Lantus in the evening.  Fasting glucose with this has been somewhat variable and not consistently high; also has some readings late morning around 10 AM which are over 200 and not clear why Mealtime insulin: he is trying to take this AFTER eating despite instructions to take this before eating and he gets confused about his insulin  instructions despite discussion with nurse educator Also he is taking the same dose of 20 units both morning and evening even though he was asked to take more insulin at suppertime He still does not understand the need for checking his blood sugar about 2 hours after eating to help adjust insulin dose  Glucose monitoring: He is still checking his blood sugar mostly before breakfast and supper.  Not clear if some of  his morning readings are after breakfast Blood sugars appear to be more variable in the afternoon and before supper and he does not think he is eating snacks during the day No hypoglycemia with current regimen  DIET: Has seen the dietitian in 2014       Meals: 2 meals per day 10 am and 5 pm. Breakfast is  toast, bacon, eggs; may have some sweets like cake, has fruit for snacks.  Evening meal usually vegetables, tuna salad, chicken.         Oral hypoglycemic drugs the patient is taking are: Metformin 1 g twice a day      INSULIN regimen is described as:  Lantus 34 daily, Novolog 20 aftert breakfast and 20 after supper   Compliance with the medical regimen:  Fair Hypoglycemia: None    Glucose monitoring: Checking 1-2 times a day        Glucometer:  Accu-Chek   Blood Glucose readings  from  Download  PRE-MEAL Breakfast Lunch Dinner Bedtime Overall  Glucose range:  116-174    100-238     Mean/median:  142    151   155   POST-MEAL PC Breakfast PC Lunch PC Dinner  Glucose range:  118-292    181, 187   Mean/median:  165       Glycemic control:   Lab Results  Component Value Date   HGBA1C 7.3* 11/06/2014   HGBA1C 7.8* 08/04/2014   HGBA1C 9.4*  05/22/2014   Lab Results  Component Value Date   MICROALBUR 3.3* 02/06/2014   LDLCALC 58 05/22/2014   CREATININE 0.88 11/06/2014        Exercise:  walking 0-/7 days a week, usually 45 minutes in the morning         Dietician visit: Most recent: 2014.               Weight history:  Wt Readings from Last 3 Encounters:  11/11/14 217 lb 9.6 oz (98.703 kg)  08/12/14 194 lb (87.998 kg)  08/04/14 204 lb (92.534 kg)       Medication List       This list is accurate as of: 11/11/14  8:33 AM.  Always use your most recent med list.               ACCU-CHEK SOFTCLIX LANCETS lancets  Check blood sugars three times a day.     allopurinol 300 MG tablet  Commonly known as:  ZYLOPRIM  take 1 tablet by mouth once daily     amLODipine 10 MG  tablet  Commonly known as:  NORVASC  Take 1 tablet (10 mg total) by mouth daily.     aspirin 81 MG tablet  Take 81 mg by mouth daily.     atorvastatin 40 MG tablet  Commonly known as:  LIPITOR  Take 1 tablet (40 mg total) by mouth daily.     B-D SINGLE USE SWABS REGULAR Pads  Use as necessary     colchicine 0.6 MG tablet  Commonly known as:  COLCRYS  take 1 tablet by mouth once daily     furosemide 20 MG tablet  Commonly known as:  LASIX  TAKE 1 TABLET BY MOUTH DAILY     glucose blood test strip  Commonly known as:  ACCU-CHEK AVIVA PLUS  Use as instructed to check blood sugar 3 times per day dx code 250.00     HYDROcodone-homatropine 5-1.5 MG/5ML syrup  Commonly known as:  HYCODAN  Take 5 mLs by mouth every 6 (six) hours as needed for cough.     insulin aspart 100 UNIT/ML FlexPen  Commonly known as:  NOVOLOG FLEXPEN  Inject 20 units into skin prior to breakfast and 20 units prior to evening meal     Insulin Pen Needle 32G X 4 MM Misc  1 each by Does not apply route 3 (three) times daily as needed.     Insulin Pen Needle 32G X 4 MM Misc  Commonly known as:  BD PEN NEEDLE NANO U/F  Use to inject insulin 3 times daily as instructed.     LANTUS SOLOSTAR 100 UNIT/ML Solostar Pen  Generic drug:  Insulin Glargine  INJECT 30 UNITS SUBCUTANEOUSLY AT BEDTIME     metFORMIN 1000 MG tablet  Commonly known as:  GLUCOPHAGE  TAKE ONE TABLET BY MOUTH TWICE DAILY. TAKE WITH A MEAL.     metoprolol 100 MG tablet  Commonly known as:  LOPRESSOR  Take 1 tablet (100 mg total) by mouth 2 (two) times daily.     nystatin-triamcinolone ointment  Commonly known as:  MYCOLOG  Apply topically 2 (two) times daily.     ramipril 10 MG capsule  Commonly known as:  ALTACE  TAKE ONE CAPSULE BY MOUTH ONCE DAILY.     sildenafil 20 MG tablet  Commonly known as:  REVATIO  Take 2 tablets (40 mg total) by mouth 3 (three) times daily.     vardenafil 20  MG tablet  Commonly known as:  LEVITRA    Take 20 mg by mouth daily as needed.        Allergies:  Allergies  Allergen Reactions  . Allopurinol     Rash    Past Medical History  Diagnosis Date  . CORONARY ARTERY DISEASE 11/23/2006  . DIABETES MELLITUS, TYPE II 11/23/2006  . GOUT 05/20/2008  . HYPERLIPIDEMIA 11/23/2006  . HYPERTENSION 11/23/2006  . 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 PNEUMONIA, LEFT LOWER LOBE 09/08/2009    Past Surgical History  Procedure Laterality Date  . Coronary angioplasty with stent placement    . Coronary artery bypass graft  2003    Family History  Problem Relation Age of Onset  . Heart disease Mother   . Heart disease Father   . Diabetes Sister   . Diabetes Brother     Social History:  reports that he has never smoked. He has never used smokeless tobacco. He reports that he does not drink alcohol or use illicit drugs.    Review of Systems    Retinal exam: Most recent:.7/15       Lipids: Adequately treated with Lipitor, this was started after his MI       Lab Results  Component Value Date   CHOL 102 05/22/2014   HDL 26.40* 05/22/2014   LDLCALC 58 05/22/2014   TRIG 89.0 05/22/2014   CHOLHDL 4 05/22/2014       The blood pressure has been treated with Norvasc, ramipril and metoprolol, has not had recent follow-up with PCP   Is on Lasix 20 mg  Complications: No evidence of neuropathy or nephropathy  Did not complain of any numbness or tingling in his feet. Also does not complain of any calf pain on walking     LABS:  Lab on 11/06/2014  Component Date Value Ref Range Status  . Hgb A1c MFr Bld 11/06/2014 7.3* 4.6 - 6.5 % Final   Glycemic Control Guidelines for People with Diabetes:Non Diabetic:  <6%Goal of Therapy: <7%Additional Action Suggested:  >8%   . Sodium 11/06/2014 137  135 - 145 mEq/L Final  . Potassium 11/06/2014 4.1  3.5 - 5.1 mEq/L Final  . Chloride 11/06/2014 103  96 - 112 mEq/L Final  . CO2 11/06/2014 31  19 - 32 mEq/L Final  . Glucose, Bld  11/06/2014 120* 70 - 99 mg/dL Final  . BUN 11/06/2014 11  6 - 23 mg/dL Final  . Creatinine, Ser 11/06/2014 0.88  0.40 - 1.50 mg/dL Final  . Calcium 11/06/2014 9.9  8.4 - 10.5 mg/dL Final  . GFR 11/06/2014 110.41  >60.00 mL/min Final    Physical Examination:  BP 148/82 mmHg  Pulse 79  Temp(Src) 97.9 F (36.6 C)  Resp 16  Ht 6' (1.829 m)  Wt 217 lb 9.6 oz (98.703 kg)  BMI 29.51 kg/m2  SpO2 97%  No pedal edema present  Diabetic foot exam shows normal monofilament sensation in the toes and plantar surfaces, no skin lesions or ulcers on the feet ; pedal pulses are absent except for right dorsalis pedis        ASSESSMENT/PLAN  Diabetes type 2, uncontrolled   See history of present illness for detailed discussion of current management, problems identified and blood sugar patterns. His blood sugars are overall better and A1c is the best long time  However he still has some fluctuation in his blood sugars with inconsistent readings both fasting and in the afternoon. Not clear if  he is checking his blood sugar right before eating or soon after eating which may affect is labile cause variability Also he is still not able to control eating sweets More recently has not been compliant with EXERCISE  He has difficulty understanding instructions and again is taking NovoLog POSTPRANDIALLY instead of before eating as instructed on each visit His insulin doses have not been changed on the last visit but he is taking less NovoLog at suppertime POSTPRANDIAL blood sugars at night have been checked only twice and these are about 180 Probably has high readings after breakfast from eating a relative high-fat meal  Discussed blood sugar targets both before and after meals Patient was given specific instructions on his insulin doses and glucose monitoring and the instructions were reviewed with him before he left He also was recommended eating more balanced meals in the mornings and avoiding eating  bacon He can also adjust his pre-meal insulin based on the amount of carbohydrate he is planning to eat especially at suppertime  Insulin doses will be unchanged for now but he can adjust the dose based on meal size and carbohydrate intake However emphasized the need to take insulin before eating consistently  HYPERTENSION: Blood pressure is well-controlled although systolic is high normal Recommended follow-up with PCP and also needs a general exam    Patient Instructions  Please check blood sugars at least half the time about 2 hours after any meal and 3 times per week on waking up.   Please bring blood sugar monitor to each visit. Recommended blood sugar levels about 2 hours after meal is 140-180 and on waking up 90-130  NOVOLOG insulin: This should be taken before meals, preferably 5-10 minutes before eating, make sure you take this with you when eating out May take 2-3 units more NovoLog if eating a larger meal and if eating a small meal or less carbohydrate may reduce it by 2-3 units  Avoid high-fat foods like bacon  Start walking regularly   Counseling time on subjects discussed above is over 50% of today's 25 minute visit     Rhilyn Battle 11/11/2014, 8:33 AM   Note: This office note was prepared with Dragon voice recognition system technology. Any transcriptional errors that result from this process are unintentional.

## 2014-11-11 NOTE — Patient Instructions (Addendum)
Please check blood sugars at least half the time about 2 hours after any meal and 3 times per week on waking up.   Please bring blood sugar monitor to each visit. Recommended blood sugar levels about 2 hours after meal is 140-180 and on waking up 90-130  NOVOLOG insulin: This should be taken before meals, preferably 5-10 minutes before eating, make sure you take this with you when eating out May take 2-3 units more NovoLog if eating a larger meal and if eating a small meal or less carbohydrate may reduce it by 2-3 units  Avoid high-fat foods like bacon  Start walking regularly

## 2014-12-12 ENCOUNTER — Encounter: Payer: Self-pay | Admitting: Gastroenterology

## 2015-01-09 ENCOUNTER — Other Ambulatory Visit: Payer: Self-pay | Admitting: *Deleted

## 2015-01-09 MED ORDER — INSULIN ASPART 100 UNIT/ML FLEXPEN: PEN_INJECTOR | SUBCUTANEOUS | Status: DC

## 2015-02-10 ENCOUNTER — Other Ambulatory Visit (INDEPENDENT_AMBULATORY_CARE_PROVIDER_SITE_OTHER): Payer: Medicare Other

## 2015-02-10 ENCOUNTER — Other Ambulatory Visit: Payer: Self-pay | Admitting: *Deleted

## 2015-02-10 DIAGNOSIS — E1165 Type 2 diabetes mellitus with hyperglycemia: Secondary | ICD-10-CM

## 2015-02-10 DIAGNOSIS — IMO0002 Reserved for concepts with insufficient information to code with codable children: Secondary | ICD-10-CM

## 2015-02-10 LAB — MICROALBUMIN / CREATININE URINE RATIO
Creatinine,U: 257 mg/dL
Microalb Creat Ratio: 8.1 mg/g (ref 0.0–30.0)
Microalb, Ur: 20.8 mg/dL — ABNORMAL HIGH (ref 0.0–1.9)

## 2015-02-10 LAB — BASIC METABOLIC PANEL
BUN: 8 mg/dL (ref 6–23)
CO2: 29 mEq/L (ref 19–32)
Calcium: 9.7 mg/dL (ref 8.4–10.5)
Chloride: 103 mEq/L (ref 96–112)
Creatinine, Ser: 0.74 mg/dL (ref 0.40–1.50)
GFR: 134.74 mL/min (ref >60.00)
Glucose, Bld: 164 mg/dL — ABNORMAL HIGH (ref 70–99)
Potassium: 3.5 mEq/L (ref 3.5–5.1)
Sodium: 138 mEq/L (ref 135–145)

## 2015-02-10 LAB — HEMOGLOBIN A1C: Hgb A1c MFr Bld: 7.8 % — ABNORMAL HIGH (ref 4.6–6.5)

## 2015-02-10 MED ORDER — INSULIN ASPART 100 UNIT/ML FLEXPEN: PEN_INJECTOR | SUBCUTANEOUS | Status: DC

## 2015-02-13 ENCOUNTER — Encounter: Payer: Self-pay | Admitting: Endocrinology

## 2015-02-13 ENCOUNTER — Ambulatory Visit (INDEPENDENT_AMBULATORY_CARE_PROVIDER_SITE_OTHER): Payer: Medicare Other | Admitting: Endocrinology

## 2015-02-13 VITALS — BP 134/84 | HR 70 | Temp 98.3°F | Resp 16 | Ht 72.0 in | Wt 218.0 lb

## 2015-02-13 DIAGNOSIS — IMO0002 Reserved for concepts with insufficient information to code with codable children: Secondary | ICD-10-CM

## 2015-02-13 DIAGNOSIS — I10 Essential (primary) hypertension: Secondary | ICD-10-CM

## 2015-02-13 DIAGNOSIS — E1165 Type 2 diabetes mellitus with hyperglycemia: Secondary | ICD-10-CM | POA: Diagnosis not present

## 2015-02-13 NOTE — Patient Instructions (Signed)
Take Metformin at breakfast and bedtime  Stop sweet tea.   Check blood sugars on waking up .Marland Kitchen 3-4 .Marland Kitchen times a week Also check blood sugars about 2 hours after a meal and do this after different meals by rotation  Recommended blood sugar levels on waking up is 90-130 and about 2 hours after meal is 140-180 Please bring blood sugar monitor to each visit.  Walk daily

## 2015-02-13 NOTE — Progress Notes (Signed)
Patient ID: Patrick Wilcox, male   DOB: 1945/12/14, 69 y.o.   MRN: [id number]    Reason for Appointment: Followup for Type 2 Diabetes  Referring physician: Burnice Logan  History of Present Illness:          Diagnosis: Type 2 diabetes mellitus, date of diagnosis: 2004       Past history:  At diagnosis he was having symptoms of drowsiness, difficulty breathing and blood sugar was reportedly over 600 He was initially treated with insulin but subsequently switched back to oral drugs He may have taken metformin and other medications for some time before going back on insulin His records indicate that he had been taking Amaryl and Januvia before going back on insulin in mid-2014 when his A1c had gone up over 13 Initially was given premixed insulin and then switched to Lantus and Humalog Record of his A1c indicates that his levels have been over 8% since 02/2012  Recent history:  He has had only fair control with basal bolus insulin and metformin He appears to need periodic education regarding his day-to-day diabetes management; previously was taking his Novolog insulin postprandially instead of before eating Also has difficulty following instructions for checking his blood sugars after meals He recently has been more compliant with his insulin regimen  Recently his A1c has gone back up even though it was 7.3 previously Not able to lose weight  Glucose monitoring results and problems identified:  He now says that he does not take metformin in the evening because he thinks it makes him drowsy and not clear how long he has been doing this  He is still checking his blood sugar mostly before breakfast recently and previously had done some readings in the evenings around supper time  His blood sugars are overall higher than before in the mornings  Blood sugars when checked previously were overall higher relatively in the evening and frequently over 200  He does not have more than to  meals and not clear why her sugars are higher before supper several hours after his first meal; he does not have many snacks but now he says that he is drinking sweetened tea during the afternoon No hypoglycemia with current regimen  DIET: Has seen the dietitian in 2014       Meals: 2 meals per day 10 am and 6-7 pm. Breakfast is  toast, bacon, eggs, has fruit for snacks.  Evening meal usually vegetables, tuna salad, chicken.         Oral hypoglycemic drugs the patient is taking are: Metformin 1 g      INSULIN regimen is described as:  Lantus 34 daily, Novolog 20 ac breakfast and 20 supper   Compliance with the medical regimen:  Fair Hypoglycemia: None    Glucose monitoring: Checking 1-2 times a day        Glucometer:  Accu-Chek   Blood Glucose readings  from  Download  Mean values apply above for all meters except median for One Touch  PRE-MEAL Fasting Lunch Dinner Bedtime Overall  Glucose range:  136-220    139-279   169    Mean/median:  175    205    180    Glycemic control:   Lab Results  Component Value Date   HGBA1C 7.8* 02/10/2015   HGBA1C 7.3* 11/06/2014   HGBA1C 7.8* 08/04/2014   Lab Results  Component Value Date   MICROALBUR 20.8* 02/10/2015   LDLCALC 58 05/22/2014   CREATININE 0.74  02/10/2015        Exercise:  walking 2/7 days a week, usually 45 minutes in the morning         Dietician visit: Most recent: 2014.               Weight history:  Wt Readings from Last 3 Encounters:  02/13/15 218 lb (98.884 kg)  11/11/14 217 lb 9.6 oz (98.703 kg)  08/12/14 194 lb (87.998 kg)       Medication List       This list is accurate as of: 02/13/15 11:59 PM.  Always use your most recent med list.               ACCU-CHEK SOFTCLIX LANCETS lancets  Check blood sugars three times a day.     allopurinol 300 MG tablet  Commonly known as:  ZYLOPRIM  take 1 tablet by mouth once daily     amLODipine 10 MG tablet  Commonly known as:  NORVASC  Take 1 tablet (10 mg  total) by mouth daily.     aspirin 81 MG tablet  Take 81 mg by mouth daily.     atorvastatin 40 MG tablet  Commonly known as:  LIPITOR  Take 1 tablet (40 mg total) by mouth daily.     B-D SINGLE USE SWABS REGULAR Pads  Use as necessary     colchicine 0.6 MG tablet  Commonly known as:  COLCRYS  take 1 tablet by mouth once daily     furosemide 20 MG tablet  Commonly known as:  LASIX  TAKE 1 TABLET BY MOUTH DAILY     glucose blood test strip  Commonly known as:  ACCU-CHEK AVIVA PLUS  Use as instructed to check blood sugar 3 times per day dx code 250.00     HYDROcodone-homatropine 5-1.5 MG/5ML syrup  Commonly known as:  HYCODAN  Take 5 mLs by mouth every 6 (six) hours as needed for cough.     insulin aspart 100 UNIT/ML FlexPen  Commonly known as:  NOVOLOG FLEXPEN  Inject 20 units into skin prior to breakfast and 20 units prior to evening meal     Insulin Pen Needle 32G X 4 MM Misc  1 each by Does not apply route 3 (three) times daily as needed.     Insulin Pen Needle 32G X 4 MM Misc  Commonly known as:  BD PEN NEEDLE NANO U/F  Use to inject insulin 3 times daily as instructed.     LANTUS SOLOSTAR 100 UNIT/ML Solostar Pen  Generic drug:  Insulin Glargine  INJECT 30 UNITS SUBCUTANEOUSLY AT BEDTIME     metFORMIN 1000 MG tablet  Commonly known as:  GLUCOPHAGE  TAKE ONE TABLET BY MOUTH TWICE DAILY. TAKE WITH A MEAL.     metoprolol 100 MG tablet  Commonly known as:  LOPRESSOR  Take 1 tablet (100 mg total) by mouth 2 (two) times daily.     nystatin-triamcinolone ointment  Commonly known as:  MYCOLOG  Apply topically 2 (two) times daily.     ramipril 10 MG capsule  Commonly known as:  ALTACE  TAKE ONE CAPSULE BY MOUTH ONCE DAILY.     sildenafil 20 MG tablet  Commonly known as:  REVATIO  Take 2 tablets (40 mg total) by mouth 3 (three) times daily.     vardenafil 20 MG tablet  Commonly known as:  LEVITRA  Take 20 mg by mouth daily as needed.  Allergies:    Allergies  Allergen Reactions  . Allopurinol     Rash    Past Medical History  Diagnosis Date  . CORONARY ARTERY DISEASE 11/23/2006  . DIABETES MELLITUS, TYPE II 11/23/2006  . GOUT 05/20/2008  . HYPERLIPIDEMIA 11/23/2006  . HYPERTENSION 11/23/2006  . 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 PNEUMONIA, LEFT LOWER LOBE 09/08/2009    Past Surgical History  Procedure Laterality Date  . Coronary angioplasty with stent placement    . Coronary artery bypass graft  2003    Family History  Problem Relation Age of Onset  . Heart disease Mother   . Heart disease Father   . Diabetes Sister   . Diabetes Brother     Social History:  reports that he has never smoked. He has never used smokeless tobacco. He reports that he does not drink alcohol or use illicit drugs.    Review of Systems    Retinal exam: Most recent:.7/15       Lipids: Adequately treated with Lipitor, this was started after his MI       Lab Results  Component Value Date   CHOL 102 05/22/2014   HDL 26.40* 05/22/2014   LDLCALC 58 05/22/2014   TRIG 89.0 05/22/2014   CHOLHDL 4 05/22/2014       The blood pressure has been treated with Norvasc, ramipril and metoprolol, has mild increase in diastolic BP  Is on Lasix 20 mg  Complications: No evidence of neuropathy or nephropathy.  Diabetic foot exam was done in 5/16 Did not complain of any numbness or tingling in his feet.     LABS:  Appointment on 02/10/2015  Component Date Value Ref Range Status  . Hgb A1c MFr Bld 02/10/2015 7.8* 4.6 - 6.5 % Final   Glycemic Control Guidelines for People with Diabetes:Non Diabetic:  <6%Goal of Therapy: <7%Additional Action Suggested:  >8%   . Sodium 02/10/2015 138  135 - 145 mEq/L Final  . Potassium 02/10/2015 3.5  3.5 - 5.1 mEq/L Final  . Chloride 02/10/2015 103  96 - 112 mEq/L Final  . CO2 02/10/2015 29  19 - 32 mEq/L Final  . Glucose, Bld 02/10/2015 164* 70 - 99 mg/dL Final  . BUN 02/10/2015 8  6 - 23 mg/dL Final  .  Creatinine, Ser 02/10/2015 0.74  0.40 - 1.50 mg/dL Final  . Calcium 02/10/2015 9.7  8.4 - 10.5 mg/dL Final  . GFR 02/10/2015 134.74  >60.00 mL/min Final  . Microalb, Ur 02/10/2015 20.8* 0.0 - 1.9 mg/dL Final  . Creatinine,U 02/10/2015 257.0   Final  . Microalb Creat Ratio 02/10/2015 8.1  0.0 - 30.0 mg/g Final    Physical Examination:  BP 134/84 mmHg  Pulse 70  Temp(Src) 98.3 F (36.8 C)  Resp 16  Ht 6' (1.829 m)  Wt 218 lb (98.884 kg)  BMI 29.56 kg/m2  SpO2 98%  No pedal edema present        ASSESSMENT/PLAN  Diabetes type 2, uncontrolled   See history of present illness for detailed discussion of current management, problems identified and blood sugar patterns. His blood sugars are not well controlled and A1c has gone up He is slightly on inadequate insulin. He now admits that he is not taking his metformin twice a day Reassured him that metformin does not cause drowsiness Also he does need to check his blood sugars more often after meals to assess his need for adjusting mealtime Novolog insulin He was also advised on adjusting  the dose based on how much he is eating  Although his fasting blood sugars may improve with restarting metformin at night he may need increased Lantus also Also reminded him about checking his blood sugars after supper and not daily in the morning He will need to eliminate drinks with sugar and consult dietitian if needed for better me planning  HYPERTENSION: Blood pressure is only fairly well controlled Does need follow-up with PCP      Patient Instructions  Take Metformin at breakfast and bedtime  Stop sweet tea.   Check blood sugars on waking up .Marland Kitchen 3-4 .Marland Kitchen times a week Also check blood sugars about 2 hours after a meal and do this after different meals by rotation  Recommended blood sugar levels on waking up is 90-130 and about 2 hours after meal is 140-180 Please bring blood sugar monitor to each visit.  Walk daily  Counseling time  on subjects discussed above is over 50% of today's 25 minute visit     Dondi Aime 02/14/2015, 3:53 PM   Note: This office note was prepared with Estate agent. Any transcriptional errors that result from this process are unintentional.

## 2015-02-16 ENCOUNTER — Telehealth: Payer: Self-pay | Admitting: Family Medicine

## 2015-02-16 MED ORDER — ALLOPURINOL 300 MG PO TABS: ORAL_TABLET | ORAL | Status: DC

## 2015-02-16 NOTE — Telephone Encounter (Signed)
Rx sent to pharmacy   

## 2015-02-16 NOTE — Telephone Encounter (Signed)
Refill request for Allopurinol 300 mg take 1 po qd and a 90 day supply to [facility] family [facility].

## 2015-02-23 ENCOUNTER — Ambulatory Visit (INDEPENDENT_AMBULATORY_CARE_PROVIDER_SITE_OTHER): Payer: Medicare Other | Admitting: Internal Medicine

## 2015-02-23 ENCOUNTER — Encounter: Payer: Self-pay | Admitting: Internal Medicine

## 2015-02-23 VITALS — BP 140/70 | HR 66 | Temp 98.3°F | Resp 20 | Ht 72.0 in | Wt 220.0 lb

## 2015-02-23 DIAGNOSIS — I252 Old myocardial infarction: Secondary | ICD-10-CM

## 2015-02-23 DIAGNOSIS — E7800 Pure hypercholesterolemia, unspecified: Secondary | ICD-10-CM

## 2015-02-23 DIAGNOSIS — E0859 Diabetes mellitus due to underlying condition with other circulatory complications: Secondary | ICD-10-CM | POA: Diagnosis not present

## 2015-02-23 DIAGNOSIS — I10 Essential (primary) hypertension: Secondary | ICD-10-CM

## 2015-02-23 NOTE — Progress Notes (Signed)
Subjective:    Patient ID: Patrick Wilcox, male    DOB: Aug 04, 1945, 69 y.o.   MRN: [id number]  HPI  Lab Results  Component Value Date   HGBA1C 7.8* 02/10/2015    69 year old patient who is seen today in follow-up.  He is followed by endocrinology for type 2 diabetes.  He states that his glycemic control is good.  When he follows is proper diet.  He will be reevaluated by dietary later this month.  He is scheduled for endocrine follow-up in 3 months He has essential hypertension which has been stable.  He has CAD and denies any exertional chest pain.  He remains on statin therapy.  He has a history of gout which has been stable.  No real concerns except the cost of his medications.  Samples provided today  Past Medical History  Diagnosis Date  . CORONARY ARTERY DISEASE 11/23/2006  . DIABETES MELLITUS, TYPE II 11/23/2006  . GOUT 05/20/2008  . HYPERLIPIDEMIA 11/23/2006  . HYPERTENSION 11/23/2006  . 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 PNEUMONIA, LEFT LOWER LOBE 09/08/2009    Social History   Social History  . Marital Status: Single    Spouse Name: N/A  . Number of Children: N/A  . Years of Education: N/A   Occupational History  . Not on file.   Social History Main Topics  . Smoking status: Never Smoker   . Smokeless tobacco: Never Used  . Alcohol Use: No  . Drug Use: No  . Sexual Activity: Not on file   Other Topics Concern  . Not on file   Social History Narrative    Past Surgical History  Procedure Laterality Date  . Coronary angioplasty with stent placement    . Coronary artery bypass graft  2003    Family History  Problem Relation Age of Onset  . Heart disease Mother   . Heart disease Father   . Diabetes Sister   . Diabetes Brother     Allergies  Allergen Reactions  . Allopurinol     Rash    Current Outpatient Prescriptions on File Prior to Visit  Medication Sig Dispense Refill  . ACCU-CHEK SOFTCLIX LANCETS lancets Check blood sugars three  times a day. 100 each 12  . Alcohol Swabs (B-D SINGLE USE SWABS REGULAR) PADS Use as necessary 300 each 6  . allopurinol (ZYLOPRIM) 300 MG tablet take 1 tablet by mouth once daily 90 tablet 1  . amLODipine (NORVASC) 10 MG tablet Take 1 tablet (10 mg total) by mouth daily. 90 tablet 1  . aspirin 81 MG tablet Take 81 mg by mouth daily.    Marland Kitchen atorvastatin (LIPITOR) 40 MG tablet Take 1 tablet (40 mg total) by mouth daily. 90 tablet 1  . colchicine (COLCRYS) 0.6 MG tablet take 1 tablet by mouth once daily 30 tablet 5  . furosemide (LASIX) 20 MG tablet TAKE 1 TABLET BY MOUTH DAILY 90 tablet 1  . glucose blood (ACCU-CHEK AVIVA PLUS) test strip Use as instructed to check blood sugar 3 times per day dx code 250.00 100 each 3  . HYDROcodone-homatropine (HYCODAN) 5-1.5 MG/5ML syrup Take 5 mLs by mouth every 6 (six) hours as needed for cough. 120 mL 0  . insulin aspart (NOVOLOG FLEXPEN) 100 UNIT/ML FlexPen Inject 20 units into skin prior to breakfast and 20 units prior to evening meal 15 pen 1  . LANTUS SOLOSTAR 100 UNIT/ML Solostar Pen INJECT 30 UNITS SUBCUTANEOUSLY AT BEDTIME (Patient taking differently: INJECT  34 UNITS SUBCUTANEOUSLY AT BEDTIME) 15 pen 3  . metFORMIN (GLUCOPHAGE) 1000 MG tablet TAKE ONE TABLET BY MOUTH TWICE DAILY. TAKE WITH A MEAL. 180 tablet 1  . metoprolol (LOPRESSOR) 100 MG tablet Take 1 tablet (100 mg total) by mouth 2 (two) times daily. 180 tablet 1  . nystatin-triamcinolone ointment (MYCOLOG) Apply topically 2 (two) times daily. 30 g 2  . ramipril (ALTACE) 10 MG capsule TAKE ONE CAPSULE BY MOUTH ONCE DAILY. 90 capsule 1  . sildenafil (REVATIO) 20 MG tablet Take 2 tablets (40 mg total) by mouth 3 (three) times daily. 50 tablet 0  . vardenafil (LEVITRA) 20 MG tablet Take 20 mg by mouth daily as needed.     No current facility-administered medications on file prior to visit.    BP 140/70 mmHg  Pulse 66  Temp(Src) 98.3 F (36.8 C) (Oral)  Resp 20  Ht 6' (1.829 m)  Wt 220 lb  (99.791 kg)  BMI 29.83 kg/m2  SpO2 98%     Review of Systems  Constitutional: Negative for fever, chills, appetite change and fatigue.  HENT: Negative for congestion, dental problem, ear pain, hearing loss, sore throat, tinnitus, trouble swallowing and voice change.   Eyes: Negative for pain, discharge and visual disturbance.  Respiratory: Negative for cough, chest tightness, wheezing and stridor.   Cardiovascular: Negative for chest pain, palpitations and leg swelling.  Gastrointestinal: Negative for nausea, vomiting, abdominal pain, diarrhea, constipation, blood in stool and abdominal distention.  Genitourinary: Negative for urgency, hematuria, flank pain, discharge, difficulty urinating and genital sores.  Musculoskeletal: Negative for myalgias, back pain, joint swelling, arthralgias, gait problem and neck stiffness.  Skin: Negative for rash.  Neurological: Negative for dizziness, syncope, speech difficulty, weakness, numbness and headaches.  Hematological: Negative for adenopathy. Does not bruise/bleed easily.  Psychiatric/Behavioral: Negative for behavioral problems and dysphoric mood. The patient is not nervous/anxious.        Objective:   Physical Exam  Constitutional: He is oriented to person, place, and time. He appears well-developed.  Weight 220  HENT:  Head: Normocephalic.  Right Ear: External ear normal.  Left Ear: External ear normal.  Eyes: Conjunctivae and EOM are normal.  Neck: Normal range of motion.  Cardiovascular: Normal rate and normal heart sounds.   Pulmonary/Chest: Breath sounds normal.  Abdominal: Bowel sounds are normal.  Musculoskeletal: Normal range of motion. He exhibits no edema or tenderness.  Neurological: He is alert and oriented to person, place, and time.  Psychiatric: He has a normal mood and affect. His behavior is normal.          Assessment & Plan:   Hypertension, well-controlled Coronary artery disease, stable Dyslipidemia.   Continue statin therapy Diabetes mellitus.  Follow-up dietary.  Follow-up endocrine and 3 months  Recheck here in 6 months or as needed

## 2015-02-23 NOTE — Progress Notes (Signed)
Pre visit review using our clinic review tool, if applicable. No additional management support is needed unless otherwise documented below in the visit note. 

## 2015-02-23 NOTE — Patient Instructions (Signed)
Limit your sodium (Salt) intake   Please check your hemoglobin A1c every 3 months     It is important that you exercise regularly, at least 20 minutes 3 to 4 times per week.  If you develop chest pain or shortness of breath seek  medical attention.  Return in 6 months for follow-up  

## 2015-03-02 ENCOUNTER — Encounter: Payer: Medicare Other | Attending: Endocrinology | Admitting: Nutrition

## 2015-03-02 VITALS — Wt 202.7 lb

## 2015-03-02 DIAGNOSIS — IMO0002 Reserved for concepts with insufficient information to code with codable children: Secondary | ICD-10-CM

## 2015-03-02 DIAGNOSIS — E118 Type 2 diabetes mellitus with unspecified complications: Secondary | ICD-10-CM | POA: Diagnosis present

## 2015-03-02 DIAGNOSIS — E1165 Type 2 diabetes mellitus with hyperglycemia: Secondary | ICD-10-CM

## 2015-03-02 DIAGNOSIS — Z794 Long term (current) use of insulin: Secondary | ICD-10-CM | POA: Insufficient documentation

## 2015-03-02 DIAGNOSIS — Z713 Dietary counseling and surveillance: Secondary | ICD-10-CM | POA: Insufficient documentation

## 2015-03-11 ENCOUNTER — Telehealth: Payer: Self-pay | Admitting: Nutrition

## 2015-03-11 NOTE — Progress Notes (Signed)
Insulin dose: lantus 34u HS                      Novolog 20u ac meals  He does not adjust this dose Typical day: 8:30-9  3 piecs bacon, 2 piecs liver pudding, 2 eggs, coffee with splenda 6-7PM: Supper:  chcken 2 small pieces peas, beans, cream potatoes, 1 piece of bread.  Sweetened Lemonade Denies eating mid afternoon HS snack: chips 2 ounces, or large bowl of ice cream    Exercise:  usually right before breakfast--yard work, or walking for 40 min.    Plan:  1. Stop the lemonade and switch to lemonade sweetened with splenda, like Crystal Light 2.  Continue the walking every morning--5 days/wk. 3.  Reduce ice cream to 1 scoop, 4.  Test before meals and at bedtime.   5.  Call me in one week with blood sugar readings.

## 2015-03-11 NOTE — Patient Instructions (Signed)
1. Stop the lemonade and switch to lemonade sweetened with splenda, like Crystal Light 2.  Continue the walking every morning--5 days/wk. 3.  Reduce ice cream to 1 scoop, 4.  Test before meals and at bedtime.   5.  Call me in one week with blood sugar readings.

## 2015-03-11 NOTE — Telephone Encounter (Signed)
Pt. Reports that his blood sugars are "coming down".  FBSs 102-130s,  AcS: 105-155,  HS: 135-155. He has stopped drinking sweetened lemonade and switched to water.  He has stopped eating icecream at night, and is walking for 40 min. 5 days/wk.   He is pleases with his blood sugar readings.

## 2015-03-11 NOTE — Telephone Encounter (Signed)
Noted  

## 2015-03-17 ENCOUNTER — Other Ambulatory Visit: Payer: Self-pay

## 2015-03-17 LAB — HM DIABETES EYE EXAM

## 2015-03-18 ENCOUNTER — Encounter: Payer: Self-pay | Admitting: Internal Medicine

## 2015-03-27 ENCOUNTER — Other Ambulatory Visit: Payer: Self-pay | Admitting: *Deleted

## 2015-03-27 MED ORDER — ATORVASTATIN CALCIUM 40 MG PO TABS: 40.0000 mg | ORAL_TABLET | Freq: Every day | ORAL | Status: DC

## 2015-03-27 MED ORDER — AMLODIPINE BESYLATE 10 MG PO TABS: 10.0000 mg | ORAL_TABLET | Freq: Every day | ORAL | Status: DC

## 2015-03-27 MED ORDER — METFORMIN HCL 1000 MG PO TABS: ORAL_TABLET | ORAL | Status: DC

## 2015-03-27 MED ORDER — COLCHICINE 0.6 MG PO TABS: ORAL_TABLET | ORAL | Status: DC

## 2015-03-27 MED ORDER — METOPROLOL TARTRATE 100 MG PO TABS: 100.0000 mg | ORAL_TABLET | Freq: Two times a day (BID) | ORAL | Status: DC

## 2015-03-27 MED ORDER — RAMIPRIL 10 MG PO CAPS: ORAL_CAPSULE | ORAL | Status: DC

## 2015-03-27 MED ORDER — FUROSEMIDE 20 MG PO TABS: ORAL_TABLET | ORAL | Status: DC

## 2015-03-30 ENCOUNTER — Ambulatory Visit (INDEPENDENT_AMBULATORY_CARE_PROVIDER_SITE_OTHER): Payer: Medicare Other | Admitting: *Deleted

## 2015-03-30 DIAGNOSIS — Z23 Encounter for immunization: Secondary | ICD-10-CM

## 2015-05-18 ENCOUNTER — Ambulatory Visit (INDEPENDENT_AMBULATORY_CARE_PROVIDER_SITE_OTHER): Payer: Medicare Other | Admitting: Endocrinology

## 2015-05-18 ENCOUNTER — Encounter: Payer: Self-pay | Admitting: Endocrinology

## 2015-05-18 VITALS — BP 130/60 | HR 67 | Temp 97.8°F | Resp 14 | Ht 72.0 in | Wt 217.0 lb

## 2015-05-18 DIAGNOSIS — Z794 Long term (current) use of insulin: Secondary | ICD-10-CM

## 2015-05-18 DIAGNOSIS — I10 Essential (primary) hypertension: Secondary | ICD-10-CM

## 2015-05-18 DIAGNOSIS — E1165 Type 2 diabetes mellitus with hyperglycemia: Secondary | ICD-10-CM | POA: Diagnosis not present

## 2015-05-18 DIAGNOSIS — IMO0002 Reserved for concepts with insufficient information to code with codable children: Secondary | ICD-10-CM

## 2015-05-18 LAB — COMPREHENSIVE METABOLIC PANEL
ALT: 20 U/L (ref 0–53)
AST: 20 U/L (ref 0–37)
Albumin: 4 g/dL (ref 3.5–5.2)
Alkaline Phosphatase: 97 U/L (ref 39–117)
BUN: 10 mg/dL (ref 6–23)
CO2: 31 mEq/L (ref 19–32)
Calcium: 9.7 mg/dL (ref 8.4–10.5)
Chloride: 101 mEq/L (ref 96–112)
Creatinine, Ser: 0.87 mg/dL (ref 0.40–1.50)
GFR: 111.7 mL/min (ref >60.00)
Glucose, Bld: 108 mg/dL — ABNORMAL HIGH (ref 70–99)
Potassium: 3.9 mEq/L (ref 3.5–5.1)
Sodium: 140 mEq/L (ref 135–145)
Total Bilirubin: 0.8 mg/dL (ref 0.2–1.2)
Total Protein: 7.3 g/dL (ref 6.0–8.3)

## 2015-05-18 LAB — LIPID PANEL
Cholesterol: 104 mg/dL (ref 0–200)
HDL: 28.7 mg/dL — ABNORMAL LOW (ref >39.00)
LDL Cholesterol: 58 mg/dL (ref 0–99)
NonHDL: 75.01
Total CHOL/HDL Ratio: 4
Triglycerides: 85 mg/dL (ref 0.0–149.0)
VLDL: 17 mg/dL (ref 0.0–40.0)

## 2015-05-18 LAB — POCT GLYCOSYLATED HEMOGLOBIN (HGB A1C): Hemoglobin A1C: 6.6

## 2015-05-18 NOTE — Patient Instructions (Signed)
Check blood sugars on waking up 3-4  times a week Also check blood sugars about 2 hours after a meal and do this after different meals by rotation  Recommended blood sugar levels on waking up is 90-130 and about 2 hours after meal is 130-160  Please bring your blood sugar monitor to each visit, thank you  Adjust Novolog based on type of meal and keep 2 hr readings as above

## 2015-05-18 NOTE — Progress Notes (Signed)
Quick Note:  Please let patient know that the lab result is normal and no further action needed ______ 

## 2015-05-18 NOTE — Progress Notes (Signed)
Patient ID: Patrick Wilcox, male   DOB: [birth year], 69 y.o.   MRN: [id number]    Reason for Appointment: Followup for Type 2 Diabetes  Referring physician: Burnice Logan  History of Present Illness:          Diagnosis: Type 2 diabetes mellitus, date of diagnosis: 2004       Past history:  At diagnosis he was having symptoms of drowsiness, difficulty breathing and blood sugar was reportedly over 600 He was initially treated with insulin but subsequently switched back to oral drugs He may have taken metformin and other medications for some time before going back on insulin His records indicate that he had been taking Amaryl and Januvia before going back on insulin in mid-2014 when his A1c had gone up over 13 Initially was given premixed insulin and then switched to Lantus and Humalog Record of his A1c indicates that his levels have been over 8% since 02/2012  Recent history:   INSULIN regimen is described as:  Lantus 34 daily hs, Novolog 20 ac breakfast and 20 supper    He has had much better blood sugar control since his last visit with his changing his lifestyle and being instructed by the nurse educator  Also he was not taking his metformin twice a day on the last visit because of perceived somnolence from this Still has difficulty following instructions for checking his blood sugars after meals  Not able to lose weight despite his trying to be fairly active  Glucose monitoring results and problems identified:  He has cut out drinks with sugar and drinking more water  He is generally trying to watch his diet.  He does not understand the normal ranges of blood sugars and will not take his Novolog in the morning if blood sugars are close to 100  No symptoms of hypoglycemia  Has not done any blood sugars after meals; could not download his meter today because of the battery being dead  Previously was having some high blood sugars in the afternoon despite not eating  lunch  DIET: Has seen the dietitian in 2014 and nurse educator in 8/16       Meals: 2 meals per day 10 am and 6-7 pm. Breakfast is  toast, bacon, eggs, has fruit for snacks.  Evening meal usually vegetables, tuna salad, chicken.         Oral hypoglycemic drugs the patient is taking are: Metformin 1 g       Compliance with the medical regimen:  Fair Hypoglycemia: None    Glucose monitoring: Checking 1-2 times a day        Glucometer:  Accu-Chek   Blood Glucose readings  from  home records show fasting readings recently ranging from 95-165, mostly around 120-140 Previously has blood sugars were averaging about 180 including evening readings  Glycemic control:   Lab Results  Component Value Date   HGBA1C 6.6 05/18/2015   HGBA1C 7.8* 02/10/2015   HGBA1C 7.3* 11/06/2014   Lab Results  Component Value Date   MICROALBUR 20.8* 02/10/2015   LDLCALC 58 05/22/2014   CREATININE 0.74 02/10/2015        Exercise:  walking 2-5/7 days a week, usually 45 minutes in the morning          Weight history:  Wt Readings from Last 3 Encounters:  05/18/15 217 lb (98.431 kg)  03/11/15 202 lb 11.2 oz (91.944 kg)  02/23/15 220 lb (99.791 kg)  Medication List       This list is accurate as of: 05/18/15 10:22 AM.  Always use your most recent med list.               ACCU-CHEK SOFTCLIX LANCETS lancets  Check blood sugars three times a day.     allopurinol 300 MG tablet  Commonly known as:  ZYLOPRIM  take 1 tablet by mouth once daily     amLODipine 10 MG tablet  Commonly known as:  NORVASC  Take 1 tablet (10 mg total) by mouth daily.     aspirin 81 MG tablet  Take 81 mg by mouth daily.     atorvastatin 40 MG tablet  Commonly known as:  LIPITOR  Take 1 tablet (40 mg total) by mouth daily.     B-D SINGLE USE SWABS REGULAR Pads  Use as necessary     colchicine 0.6 MG tablet  Commonly known as:  COLCRYS  take 1 tablet by mouth once daily     EASY COMFORT PEN NEEDLES 31G X 5  MM Misc  Generic drug:  Insulin Pen Needle  use as directed THREE TIMES DAILY with lantus and novolog doses     furosemide 20 MG tablet  Commonly known as:  LASIX  TAKE 1 TABLET BY MOUTH DAILY     glucose blood test strip  Commonly known as:  ACCU-CHEK AVIVA PLUS  Use as instructed to check blood sugar 3 times per day dx code 250.00     HYDROcodone-homatropine 5-1.5 MG/5ML syrup  Commonly known as:  HYCODAN  Take 5 mLs by mouth every 6 (six) hours as needed for cough.     insulin aspart 100 UNIT/ML FlexPen  Commonly known as:  NOVOLOG FLEXPEN  Inject 20 units into skin prior to breakfast and 20 units prior to evening meal     LANTUS SOLOSTAR 100 UNIT/ML Solostar Pen  Generic drug:  Insulin Glargine  INJECT 30 UNITS SUBCUTANEOUSLY AT BEDTIME     metFORMIN 1000 MG tablet  Commonly known as:  GLUCOPHAGE  TAKE ONE TABLET BY MOUTH TWICE DAILY. TAKE WITH A MEAL.     metoprolol 100 MG tablet  Commonly known as:  LOPRESSOR  Take 1 tablet (100 mg total) by mouth 2 (two) times daily.     nystatin-triamcinolone ointment  Commonly known as:  MYCOLOG  Apply topically 2 (two) times daily.     ramipril 10 MG capsule  Commonly known as:  ALTACE  TAKE ONE CAPSULE BY MOUTH ONCE DAILY.     sildenafil 20 MG tablet  Commonly known as:  REVATIO  Take 2 tablets (40 mg total) by mouth 3 (three) times daily.     vardenafil 20 MG tablet  Commonly known as:  LEVITRA  Take 20 mg by mouth daily as needed.        Allergies:  Allergies  Allergen Reactions  . Allopurinol     Rash    Past Medical History  Diagnosis Date  . CORONARY ARTERY DISEASE 11/23/2006  . DIABETES MELLITUS, TYPE II 11/23/2006  . GOUT 05/20/2008  . HYPERLIPIDEMIA 11/23/2006  . HYPERTENSION 11/23/2006  . 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 PNEUMONIA, LEFT LOWER LOBE 09/08/2009    Past Surgical History  Procedure Laterality Date  . Coronary angioplasty with stent placement    . Coronary artery bypass graft   2003    Family History  Problem Relation Age of Onset  . Heart disease Mother   .  Heart disease Father   . Diabetes Sister   . Diabetes Brother     Social History:  reports that he has never smoked. He has never used smokeless tobacco. He reports that he does not drink alcohol or use illicit drugs.    Review of Systems        Lipids: Adequately treated with Lipitor, this was started after his MI, no recent labs available       Lab Results  Component Value Date   CHOL 102 05/22/2014   HDL 26.40* 05/22/2014   LDLCALC 58 05/22/2014   TRIG 89.0 05/22/2014   CHOLHDL 4 05/22/2014       The blood pressure has been treated with Norvasc, ramipril and metoprolol, has had follow-up with PCP recently  Is on Lasix 20 mg  Complications: No evidence of neuropathy or nephropathy.   Diabetic foot exam was done in 5/16     LABS:  Office Visit on 05/18/2015  Component Date Value Ref Range Status  . Hemoglobin A1C 05/18/2015 6.6   Final    Physical Examination:  BP 130/60 mmHg  Pulse 67  Temp(Src) 97.8 F (36.6 C) (Oral)  Resp 14  Ht 6' (1.829 m)  Wt 217 lb (98.431 kg)  BMI 29.42 kg/m2  SpO2 97%         ASSESSMENT/PLAN  Diabetes type 2, uncontrolled   See history of present illness for detailed discussion of current management, problems identified and blood sugar patterns. His blood sugars are much better controlled with A1c now below 7% for the first time in years This is from his improving his diet, cutting out sweets drinks, increasing metformin to twice a day and being consistent with exercise  Discussed day-to-day adjustment of Novolog insulin and also need for postprandial monitoring to help adjust the dose He will continue Bactrim for his meter  HYPERTENSION: Blood pressure is fairly well controlled  LIPIDS: To be checked today    Patient Instructions  Check blood sugars on waking up 3-4  times a week Also check blood sugars about 2 hours after a meal  and do this after different meals by rotation  Recommended blood sugar levels on waking up is 90-130 and about 2 hours after meal is 130-160  Please bring your blood sugar monitor to each visit, thank you  Adjust Novolog based on type of meal and keep 2 hr readings as above         Lorene Klimas 05/18/2015, 10:22 AM   Note: This office note was prepared with Dragon voice recognition system technology. Any transcriptional errors that result from this process are unintentional.

## 2015-05-27 ENCOUNTER — Other Ambulatory Visit: Payer: Self-pay | Admitting: *Deleted

## 2015-05-27 MED ORDER — INSULIN ASPART 100 UNIT/ML FLEXPEN: PEN_INJECTOR | SUBCUTANEOUS | Status: DC

## 2015-08-13 ENCOUNTER — Other Ambulatory Visit (INDEPENDENT_AMBULATORY_CARE_PROVIDER_SITE_OTHER): Payer: Medicare Other

## 2015-08-13 DIAGNOSIS — E1165 Type 2 diabetes mellitus with hyperglycemia: Secondary | ICD-10-CM

## 2015-08-13 DIAGNOSIS — IMO0002 Reserved for concepts with insufficient information to code with codable children: Secondary | ICD-10-CM

## 2015-08-13 LAB — BASIC METABOLIC PANEL
BUN: 14 mg/dL (ref 6–23)
CO2: 28 mEq/L (ref 19–32)
Calcium: 10.1 mg/dL (ref 8.4–10.5)
Chloride: 102 mEq/L (ref 96–112)
Creatinine, Ser: 0.81 mg/dL (ref 0.40–1.50)
GFR: 121.22 mL/min (ref >60.00)
Glucose, Bld: 182 mg/dL — ABNORMAL HIGH (ref 70–99)
Potassium: 3.9 mEq/L (ref 3.5–5.1)
Sodium: 139 mEq/L (ref 135–145)

## 2015-08-13 LAB — HEMOGLOBIN A1C: Hgb A1c MFr Bld: 7.3 % — ABNORMAL HIGH (ref 4.6–6.5)

## 2015-08-17 ENCOUNTER — Other Ambulatory Visit: Payer: Self-pay | Admitting: *Deleted

## 2015-08-17 MED ORDER — INSULIN ASPART 100 UNIT/ML FLEXPEN: PEN_INJECTOR | SUBCUTANEOUS | Status: DC

## 2015-08-18 ENCOUNTER — Ambulatory Visit (INDEPENDENT_AMBULATORY_CARE_PROVIDER_SITE_OTHER): Payer: Medicare Other | Admitting: Endocrinology

## 2015-08-18 ENCOUNTER — Encounter: Payer: Self-pay | Admitting: Endocrinology

## 2015-08-18 VITALS — BP 138/78 | HR 63 | Temp 98.5°F | Resp 16 | Ht 72.0 in | Wt 219.0 lb

## 2015-08-18 DIAGNOSIS — Z794 Long term (current) use of insulin: Secondary | ICD-10-CM

## 2015-08-18 DIAGNOSIS — E1165 Type 2 diabetes mellitus with hyperglycemia: Secondary | ICD-10-CM

## 2015-08-18 NOTE — Progress Notes (Signed)
Patient ID: Patrick Wilcox, male   DOB: [birth year], 70 y.o.   MRN: [id number]    Reason for Appointment: Followup for Type 2 Diabetes  Referring physician: Burnice Logan  History of Present Illness:          Diagnosis: Type 2 diabetes mellitus, date of diagnosis: 2004       Past history:  At diagnosis he was having symptoms of drowsiness, difficulty breathing and blood sugar was reportedly over 600 He was initially treated with insulin but subsequently switched back to oral drugs He may have taken metformin and other medications for some time before going back on insulin His records indicate that he had been taking Amaryl and Januvia before going back on insulin in mid-2014 when his A1c had gone up over 13 Initially was given premixed insulin and then switched to Lantus and Humalog Record of his A1c indicates that his levels have been over 8% since 02/2012  Recent history:   INSULIN regimen is described as:  Lantus 30 daily hs, Novolog 20 ac breakfast and 20 supper    He  had much better blood sugar control on his last visit with his changing his lifestyle and being instructed by the nurse educator High over his A1c has gone up to 7.3  Glucose monitoring results and problems identified:  He has again been checking his blood sugars only fasting and not after meals as directed  He thinks his high blood sugars are related to stress because of family illnesses and issues  His fasting readings are mildly increased and sporadically high also  AVERAGE glucose at home is 136 with most readings before breakfast, range 103-234 cut out drinks with sugar and drinking more water  He is not exercising or being active recently and weight is going up another 2 pounds  No hypoglycemia reported  He is not eating any protein at breakfast recently and getting mostly cereal, fruit and apple juice and his lab glucose was about 180 postprandially  DIET: Has seen the dietitian in 2014 and  nurse educator in 8/16       Meals: 2 meals per day 10 am and 6-7 pm. Breakfast is cereal mostly;  toast, bacon, eggs, has fruit for snacks.  Evening meal usually vegetables, tuna salad, chicken.         Oral hypoglycemic drugs the patient is taking are: Metformin 1 g       Compliance with the medical regimen:  Fair Hypoglycemia: None    Glucose monitoring: Checking 1-2 times a day        Glucometer:  Accu-Chek   Blood Glucose readings  from  home records show fasting readings as above Has only one evening reading of 174 His monitor is off by 12 hours    Exercise:  walking less recently      Weight history:  Wt Readings from Last 3 Encounters:  08/18/15 219 lb (99.338 kg)  05/18/15 217 lb (98.431 kg)  03/11/15 202 lb 11.2 oz (91.944 kg)    Glycemic control:   Lab Results  Component Value Date   HGBA1C 7.3* 08/13/2015   HGBA1C 6.6 05/18/2015   HGBA1C 7.8* 02/10/2015   Lab Results  Component Value Date   MICROALBUR 20.8* 02/10/2015   LDLCALC 58 05/18/2015   CREATININE 0.81 08/13/2015         Medication List       This list is accurate as of: 08/18/15  8:53 PM.  Always use your most  recent med list.               ACCU-CHEK SOFTCLIX LANCETS lancets  Check blood sugars three times a day.     allopurinol 300 MG tablet  Commonly known as:  ZYLOPRIM  take 1 tablet by mouth once daily     amLODipine 10 MG tablet  Commonly known as:  NORVASC  Take 1 tablet (10 mg total) by mouth daily.     aspirin 81 MG tablet  Take 81 mg by mouth daily.     atorvastatin 40 MG tablet  Commonly known as:  LIPITOR  Take 1 tablet (40 mg total) by mouth daily.     B-D SINGLE USE SWABS REGULAR Pads  Use as necessary     colchicine 0.6 MG tablet  Commonly known as:  COLCRYS  take 1 tablet by mouth once daily     EASY COMFORT PEN NEEDLES 31G X 5 MM Misc  Generic drug:  Insulin Pen Needle  use as directed THREE TIMES DAILY with lantus and novolog doses     furosemide 20 MG  tablet  Commonly known as:  LASIX  TAKE 1 TABLET BY MOUTH DAILY     glucose blood test strip  Commonly known as:  ACCU-CHEK AVIVA PLUS  Use as instructed to check blood sugar 3 times per day dx code 250.00     HYDROcodone-homatropine 5-1.5 MG/5ML syrup  Commonly known as:  HYCODAN  Take 5 mLs by mouth every 6 (six) hours as needed for cough.     insulin aspart 100 UNIT/ML FlexPen  Commonly known as:  NOVOLOG FLEXPEN  Inject 20 units into skin prior to breakfast and 20 units prior to evening meal     LANTUS SOLOSTAR 100 UNIT/ML Solostar Pen  Generic drug:  Insulin Glargine  INJECT 30 UNITS SUBCUTANEOUSLY AT BEDTIME     metFORMIN 1000 MG tablet  Commonly known as:  GLUCOPHAGE  TAKE ONE TABLET BY MOUTH TWICE DAILY. TAKE WITH A MEAL.     metoprolol 100 MG tablet  Commonly known as:  LOPRESSOR  Take 1 tablet (100 mg total) by mouth 2 (two) times daily.     nystatin-triamcinolone ointment  Commonly known as:  MYCOLOG  Apply topically 2 (two) times daily.     ramipril 10 MG capsule  Commonly known as:  ALTACE  TAKE ONE CAPSULE BY MOUTH ONCE DAILY.     sildenafil 20 MG tablet  Commonly known as:  REVATIO  Take 2 tablets (40 mg total) by mouth 3 (three) times daily.     vardenafil 20 MG tablet  Commonly known as:  LEVITRA  Take 20 mg by mouth daily as needed.        Allergies:  Allergies  Allergen Reactions  . Allopurinol     Rash    Past Medical History  Diagnosis Date  . CORONARY ARTERY DISEASE 11/23/2006  . DIABETES MELLITUS, TYPE II 11/23/2006  . GOUT 05/20/2008  . HYPERLIPIDEMIA 11/23/2006  . HYPERTENSION 11/23/2006  . 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 PNEUMONIA, LEFT LOWER LOBE 09/08/2009    Past Surgical History  Procedure Laterality Date  . Coronary angioplasty with stent placement    . Coronary artery bypass graft  2003    Family History  Problem Relation Age of Onset  . Heart disease Mother   . Heart disease Father   . Diabetes Sister   .  Diabetes Brother     Social History:  reports that  he has never smoked. He has never used smokeless tobacco. He reports that he does not drink alcohol or use illicit drugs.    Review of Systems        Lipids:  LDL Adequately treated with Lipitor, this was started after his MI, HDL still low        Lab Results  Component Value Date   CHOL 104 05/18/2015   HDL 28.70* 05/18/2015   LDLCALC 58 05/18/2015   TRIG 85.0 05/18/2015   CHOLHDL 4 05/18/2015       The blood pressure has been treated with Norvasc, ramipril and metoprolol, has had follow-up with PCP recently   Complications: No evidence of neuropathy or nephropathy.   Diabetic foot exam was done in 5/16     LABS:  Lab on 08/13/2015  Component Date Value Ref Range Status  . Hgb A1c MFr Bld 08/13/2015 7.3* 4.6 - 6.5 % Final   Glycemic Control Guidelines for People with Diabetes:Non Diabetic:  <6%Goal of Therapy: <7%Additional Action Suggested:  >8%   . Sodium 08/13/2015 139  135 - 145 mEq/L Final  . Potassium 08/13/2015 3.9  3.5 - 5.1 mEq/L Final  . Chloride 08/13/2015 102  96 - 112 mEq/L Final  . CO2 08/13/2015 28  19 - 32 mEq/L Final  . Glucose, Bld 08/13/2015 182* 70 - 99 mg/dL Final  . BUN 08/13/2015 14  6 - 23 mg/dL Final  . Creatinine, Ser 08/13/2015 0.81  0.40 - 1.50 mg/dL Final  . Calcium 08/13/2015 10.1  8.4 - 10.5 mg/dL Final  . GFR 08/13/2015 121.22  >60.00 mL/min Final    Physical Examination:  BP 138/78 mmHg  Pulse 63  Temp(Src) 98.5 F (36.9 C)  Resp 16  Ht 6' (1.829 m)  Wt 219 lb (99.338 kg)  BMI 29.70 kg/m2  SpO2 97%         ASSESSMENT/PLAN  Diabetes type 2, uncontrolled   See history of present illness for detailed discussion of current management, problems identified and blood sugar patterns. His blood sugars are  not as well controlled with A1c going up over 7% again This is likely to be from his being inconsistent with diet, not exercising and stress He still does not understand the  need to check his blood sugars at various times per day was done fasting  Also he is now eating unbalanced meals in the morning with only carbohydrate causing post prandial hyperglycemia Discussed better options for breakfast with some protein Encouraged him to start walking again He will also check more readings after meals His meter was reprogrammed to the correct date and time  HYPERTENSION: Blood pressure is fairly well controlled     Patient Instructions  Check blood sugars on waking up 3-4  times a week Also check blood sugars about 2 hours after a meal and do this after different meals by rotation  Recommended blood sugar levels on waking up is 90-130 and about 2 hours after meal is 130-160  Please bring your blood sugar monitor to each visit, thank you  Walk daily  Add a protein with less cereal in ams         [facility] 08/18/2015, 8:53 PM   Note: This office note was prepared with Dragon voice recognition system technology. Any transcriptional errors that result from this process are unintentional.

## 2015-08-18 NOTE — Patient Instructions (Signed)
Check blood sugars on waking up 3-4  times a week Also check blood sugars about 2 hours after a meal and do this after different meals by rotation  Recommended blood sugar levels on waking up is 90-130 and about 2 hours after meal is 130-160  Please bring your blood sugar monitor to each visit, thank you  Walk daily  Add a protein with less cereal in ams

## 2015-08-25 ENCOUNTER — Other Ambulatory Visit: Payer: Self-pay | Admitting: *Deleted

## 2015-08-25 MED ORDER — INSULIN GLARGINE 100 UNIT/ML SOLOSTAR PEN: PEN_INJECTOR | SUBCUTANEOUS | Status: DC

## 2015-08-26 ENCOUNTER — Encounter: Payer: Self-pay | Admitting: Internal Medicine

## 2015-08-26 ENCOUNTER — Ambulatory Visit (INDEPENDENT_AMBULATORY_CARE_PROVIDER_SITE_OTHER): Payer: Medicare Other | Admitting: Internal Medicine

## 2015-08-26 VITALS — BP 150/70 | HR 66 | Temp 98.5°F | Resp 20 | Ht 72.0 in | Wt 219.0 lb

## 2015-08-26 DIAGNOSIS — E1159 Type 2 diabetes mellitus with other circulatory complications: Secondary | ICD-10-CM | POA: Insufficient documentation

## 2015-08-26 DIAGNOSIS — I10 Essential (primary) hypertension: Secondary | ICD-10-CM

## 2015-08-26 DIAGNOSIS — Z23 Encounter for immunization: Secondary | ICD-10-CM | POA: Diagnosis not present

## 2015-08-26 DIAGNOSIS — E7800 Pure hypercholesterolemia, unspecified: Secondary | ICD-10-CM

## 2015-08-26 DIAGNOSIS — I2510 Atherosclerotic heart disease of native coronary artery without angina pectoris: Secondary | ICD-10-CM | POA: Diagnosis not present

## 2015-08-26 DIAGNOSIS — I152 Hypertension secondary to endocrine disorders: Secondary | ICD-10-CM

## 2015-08-26 NOTE — Progress Notes (Signed)
Subjective:    Patient ID: Patrick Wilcox, male    DOB: 1946-04-09, 70 y.o.   MRN: [id number]  HPI  Lab Results  Component Value Date   HGBA1C 7.3* 08/13/2015    BP Readings from Last 3 Encounters:  08/26/15 150/70  08/18/15 138/78  05/18/15 130/60    Wt Readings from Last 3 Encounters:  08/26/15 219 lb (99.338 kg)  08/18/15 219 lb (99.338 kg)  05/18/15 217 lb (98.60 kg)    70 year old patient who is seen today for his biannual follow-up.  He is followed by endocrinology for diabetes.  He has coronary artery disease and status post CABG in 2003.  He remains asymptomatic.  Since his last visit here, he has had 2 brothers that have died from cardiac complications.   .  He has hypertension, dyslipidemia.    no exertional chest pain.  In general doing well. He has had a annual diabetic eye exam.   Past Medical History  Diagnosis Date  . CORONARY ARTERY DISEASE 11/23/2006  . DIABETES MELLITUS, TYPE II 11/23/2006  . GOUT 05/20/2008  . HYPERLIPIDEMIA 11/23/2006  . HYPERTENSION 11/23/2006  . 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 PNEUMONIA, LEFT LOWER LOBE 09/08/2009    Social History   Social History  . Marital Status: Single    Spouse Name: N/A  . Number of Children: N/A  . Years of Education: N/A   Occupational History  . Not on file.   Social History Main Topics  . Smoking status: Never Smoker   . Smokeless tobacco: Never Used  . Alcohol Use: No  . Drug Use: No  . Sexual Activity: Not on file   Other Topics Concern  . Not on file   Social History Narrative    Past Surgical History  Procedure Laterality Date  . Coronary angioplasty with stent placement    . Coronary artery bypass graft  2003    Family History  Problem Relation Age of Onset  . Heart disease Mother   . Heart disease Father   . Diabetes Sister   . Diabetes Brother     Allergies  Allergen Reactions  . Allopurinol     Rash    Current Outpatient Prescriptions on File Prior to  Visit  Medication Sig Dispense Refill  . ACCU-CHEK SOFTCLIX LANCETS lancets Check blood sugars three times a day. 100 each 12  . Alcohol Swabs (B-D SINGLE USE SWABS REGULAR) PADS Use as necessary 300 each 6  . allopurinol (ZYLOPRIM) 300 MG tablet take 1 tablet by mouth once daily 90 tablet 1  . amLODipine (NORVASC) 10 MG tablet Take 1 tablet (10 mg total) by mouth daily. 90 tablet 3  . aspirin 81 MG tablet Take 81 mg by mouth daily.    Marland Kitchen atorvastatin (LIPITOR) 40 MG tablet Take 1 tablet (40 mg total) by mouth daily. 90 tablet 3  . colchicine (COLCRYS) 0.6 MG tablet take 1 tablet by mouth once daily 90 tablet 3  . EASY COMFORT PEN NEEDLES 31G X 5 MM MISC use as directed THREE TIMES DAILY with lantus and novolog doses  99  . furosemide (LASIX) 20 MG tablet TAKE 1 TABLET BY MOUTH DAILY 90 tablet 3  . glucose blood (ACCU-CHEK AVIVA PLUS) test strip Use as instructed to check blood sugar 3 times per day dx code 250.00 100 each 3  . HYDROcodone-homatropine (HYCODAN) 5-1.5 MG/5ML syrup Take 5 mLs by mouth every 6 (six) hours as needed for cough. 120 mL  0  . insulin aspart (NOVOLOG FLEXPEN) 100 UNIT/ML FlexPen Inject 20 units into skin prior to breakfast and 20 units prior to evening meal 15 pen 1  . Insulin Glargine (LANTUS SOLOSTAR) 100 UNIT/ML Solostar Pen INJECT 34 UNITS SUBCUTANEOUSLY AT BEDTIME 15 pen 3  . metFORMIN (GLUCOPHAGE) 1000 MG tablet TAKE ONE TABLET BY MOUTH TWICE DAILY. TAKE WITH A MEAL. 180 tablet 3  . metoprolol (LOPRESSOR) 100 MG tablet Take 1 tablet (100 mg total) by mouth 2 (two) times daily. 180 tablet 3  . nystatin-triamcinolone ointment (MYCOLOG) Apply topically 2 (two) times daily. 30 g 2  . ramipril (ALTACE) 10 MG capsule TAKE ONE CAPSULE BY MOUTH ONCE DAILY. 90 capsule 3  . sildenafil (REVATIO) 20 MG tablet Take 2 tablets (40 mg total) by mouth 3 (three) times daily. 50 tablet 0  . vardenafil (LEVITRA) 20 MG tablet Take 20 mg by mouth daily as needed.     No current  facility-administered medications on file prior to visit.    BP 150/70 mmHg  Pulse 66  Temp(Src) 98.5 F (36.9 C) (Oral)  Resp 20  Ht 6' (1.829 m)  Wt 219 lb (99.338 kg)  BMI 29.70 kg/m2  SpO2 98%    Review of Systems  Constitutional: Negative for fever, chills, appetite change and fatigue.  HENT: Negative for congestion, dental problem, ear pain, hearing loss, sore throat, tinnitus, trouble swallowing and voice change.   Eyes: Negative for pain, discharge and visual disturbance.  Respiratory: Negative for cough, chest tightness, wheezing and stridor.   Cardiovascular: Negative for chest pain, palpitations and leg swelling.  Gastrointestinal: Negative for nausea, vomiting, abdominal pain, diarrhea, constipation, blood in stool and abdominal distention.  Genitourinary: Negative for urgency, hematuria, flank pain, discharge, difficulty urinating and genital sores.  Musculoskeletal: Negative for myalgias, back pain, joint swelling, arthralgias, gait problem and neck stiffness.  Skin: Negative for rash.  Neurological: Negative for dizziness, syncope, speech difficulty, weakness, numbness and headaches.  Hematological: Negative for adenopathy. Does not bruise/bleed easily.  Psychiatric/Behavioral: Negative for behavioral problems and dysphoric mood. The patient is not nervous/anxious.        Objective:   Physical Exam  Constitutional: He is oriented to person, place, and time. He appears well-developed.  HENT:  Head: Normocephalic.  Right Ear: External ear normal.  Left Ear: External ear normal.  Eyes: Conjunctivae and EOM are normal.  Neck: Normal range of motion.  Cardiovascular: Normal rate.   Murmur heard. Status post sternotomy Grade 2/6 systolic murmur  Pulmonary/Chest: Breath sounds normal.  Abdominal: Bowel sounds are normal.  Musculoskeletal: Normal range of motion. He exhibits no edema or tenderness.  Neurological: He is alert and oriented to person, place, and  time.  Psychiatric: He has a normal mood and affect. His behavior is normal.          Assessment & Plan:       Coronary artery disease.  Will schedule nuclear stress test Essential hypertension, stable Diabetes, reasonable control Dyslipidemia.  Continue statin therapy  Recheck 6 months

## 2015-08-26 NOTE — Patient Instructions (Signed)
Limit your sodium (Salt) intake  Please check your blood pressure on a regular basis.  If it is consistently greater than 150/90, please make an office appointment.    It is important that you exercise regularly, at least 20 minutes 3 to 4 times per week.  If you develop chest pain or shortness of breath seek  medical attention.  Cardiac stress testing as discussed

## 2015-08-26 NOTE — Progress Notes (Signed)
Pre visit review using our clinic review tool, if applicable. No additional management support is needed unless otherwise documented below in the visit note. 

## 2015-09-10 ENCOUNTER — Telehealth (HOSPITAL_COMMUNITY): Payer: Self-pay

## 2015-09-10 NOTE — Telephone Encounter (Signed)
Encounter complete. 

## 2015-09-15 ENCOUNTER — Inpatient Hospital Stay (HOSPITAL_COMMUNITY): Admission: RE | Admit: 2015-09-15 | Payer: Medicare Other | Source: Ambulatory Visit

## 2015-09-24 ENCOUNTER — Other Ambulatory Visit: Payer: Self-pay

## 2015-10-01 ENCOUNTER — Telehealth (HOSPITAL_COMMUNITY): Payer: Self-pay

## 2015-10-01 NOTE — Telephone Encounter (Signed)
Encounter complete. 

## 2015-10-06 ENCOUNTER — Ambulatory Visit (HOSPITAL_COMMUNITY)
Admission: RE | Admit: 2015-10-06 | Discharge: 2015-10-06 | Disposition: A | Discharge status: Home / Self Care | Payer: Medicare Other | Source: Ambulatory Visit | Attending: Internal Medicine | Admitting: Internal Medicine

## 2015-10-06 DIAGNOSIS — I2510 Atherosclerotic heart disease of native coronary artery without angina pectoris: Secondary | ICD-10-CM | POA: Diagnosis present

## 2015-10-06 DIAGNOSIS — E119 Type 2 diabetes mellitus without complications: Secondary | ICD-10-CM | POA: Diagnosis not present

## 2015-10-06 DIAGNOSIS — Z8249 Family history of ischemic heart disease and other diseases of the circulatory system: Secondary | ICD-10-CM | POA: Diagnosis not present

## 2015-10-06 DIAGNOSIS — I10 Essential (primary) hypertension: Secondary | ICD-10-CM | POA: Insufficient documentation

## 2015-10-06 DIAGNOSIS — Z6829 Body mass index (BMI) 29.0-29.9, adult: Secondary | ICD-10-CM | POA: Insufficient documentation

## 2015-10-06 DIAGNOSIS — E663 Overweight: Secondary | ICD-10-CM | POA: Diagnosis not present

## 2015-10-06 LAB — MYOCARDIAL PERFUSION IMAGING
LV dias vol: 125 mL (ref 62–150)
LV sys vol: 48 mL
Peak HR: 85 {beats}/min
Rest HR: 55 {beats}/min
SDS: 7
SRS: 2
SSS: 8
TID: 1.09

## 2015-10-06 MED ORDER — TECHNETIUM TC 99M SESTAMIBI GENERIC - CARDIOLITE
30.8000 | Freq: Once | INTRAVENOUS | Status: AC | PRN
  Administered 2015-10-06: 30.8 via INTRAVENOUS

## 2015-10-06 MED ORDER — TECHNETIUM TC 99M SESTAMIBI GENERIC - CARDIOLITE
10.0000 | Freq: Once | INTRAVENOUS | Status: AC | PRN
  Administered 2015-10-06: 10 via INTRAVENOUS

## 2015-10-06 MED ORDER — REGADENOSON 0.4 MG/5ML IV SOLN
0.4000 mg | Freq: Once | INTRAVENOUS | Status: AC
  Administered 2015-10-06: 0.4 mg via INTRAVENOUS

## 2015-10-13 ENCOUNTER — Other Ambulatory Visit: Payer: Self-pay | Admitting: *Deleted

## 2015-10-13 MED ORDER — ALLOPURINOL 300 MG PO TABS: ORAL_TABLET | ORAL | Status: DC

## 2015-11-12 ENCOUNTER — Other Ambulatory Visit (INDEPENDENT_AMBULATORY_CARE_PROVIDER_SITE_OTHER): Payer: Medicare Other

## 2015-11-12 ENCOUNTER — Other Ambulatory Visit: Payer: Self-pay

## 2015-11-12 DIAGNOSIS — E1165 Type 2 diabetes mellitus with hyperglycemia: Secondary | ICD-10-CM | POA: Diagnosis not present

## 2015-11-12 DIAGNOSIS — Z794 Long term (current) use of insulin: Secondary | ICD-10-CM | POA: Diagnosis not present

## 2015-11-12 LAB — COMPREHENSIVE METABOLIC PANEL
ALT: 28 U/L (ref 0–53)
AST: 23 U/L (ref 0–37)
Albumin: 4.4 g/dL (ref 3.5–5.2)
Alkaline Phosphatase: 101 U/L (ref 39–117)
BUN: 8 mg/dL (ref 6–23)
CO2: 26 mEq/L (ref 19–32)
Calcium: 9.9 mg/dL (ref 8.4–10.5)
Chloride: 104 mEq/L (ref 96–112)
Creatinine, Ser: 0.74 mg/dL (ref 0.40–1.50)
GFR: 134.45 mL/min (ref >60.00)
Glucose, Bld: 178 mg/dL — ABNORMAL HIGH (ref 70–99)
Potassium: 3.9 mEq/L (ref 3.5–5.1)
Sodium: 142 mEq/L (ref 135–145)
Total Bilirubin: 0.5 mg/dL (ref 0.2–1.2)
Total Protein: 7.7 g/dL (ref 6.0–8.3)

## 2015-11-12 LAB — HEMOGLOBIN A1C: Hgb A1c MFr Bld: 7.8 % — ABNORMAL HIGH (ref 4.6–6.5)

## 2015-11-12 MED ORDER — INSULIN ASPART 100 UNIT/ML FLEXPEN: PEN_INJECTOR | SUBCUTANEOUS | Status: DC

## 2015-11-17 ENCOUNTER — Ambulatory Visit (INDEPENDENT_AMBULATORY_CARE_PROVIDER_SITE_OTHER): Payer: Medicare Other | Admitting: Endocrinology

## 2015-11-17 ENCOUNTER — Encounter: Payer: Self-pay | Admitting: Endocrinology

## 2015-11-17 ENCOUNTER — Other Ambulatory Visit: Payer: Self-pay | Admitting: *Deleted

## 2015-11-17 VITALS — BP 133/63 | HR 67 | Temp 98.1°F | Resp 14 | Ht 72.0 in | Wt 221.8 lb

## 2015-11-17 DIAGNOSIS — E1165 Type 2 diabetes mellitus with hyperglycemia: Secondary | ICD-10-CM

## 2015-11-17 DIAGNOSIS — Z794 Long term (current) use of insulin: Secondary | ICD-10-CM | POA: Diagnosis not present

## 2015-11-17 MED ORDER — GLUCOSE BLOOD VI STRP: ORAL_STRIP | Status: DC

## 2015-11-17 NOTE — Progress Notes (Signed)
Patient ID: Patrick Wilcox, male   DOB: Dec 14, 1945, 70 y.o.   MRN: [name]    Reason for Appointment: Followup for Type 2 Diabetes  Referring physician: Burnice Logan  History of Present Illness:          Diagnosis: Type 2 diabetes mellitus, date of diagnosis: 2004       Past history:  At diagnosis he was having symptoms of drowsiness, difficulty breathing and blood sugar was reportedly over 600 He was initially treated with insulin but subsequently switched back to oral drugs He may have taken metformin and other medications for some time before going back on insulin His records indicate that he had been taking Amaryl and Januvia before going back on insulin in mid-2014 when his A1c had gone up over 13 Initially was given premixed insulin and then switched to Lantus and Humalog Record of his A1c indicates that his levels previously had been over 8% since 02/2012 He  had much better blood sugar control on his last visit with his changing his lifestyle and being instructed by the nurse educator  Recent history:   INSULIN regimen is described as:  Lantus 30 daily hs, Novolog 20 ac breakfast and 20 supper    A1c has gone up further to 7.8, previously 7.3  Glucose monitoring results, current management and problems identified:  He has again been checking his blood sugars only fasting and not after meals  Blood sugars are also variable, relatively higher when he checks relatively early in the morning on some days  He thinks his high blood sugars are also sometimes related to eating dessert late at night without any additional insulin  He is not exercising consistently and weight is going up another 2 pounds  No hypoglycemia reported; he is taking the same insulin doses consistently with both his meals regardless of what he is eating.  Previously had better compliance with being consistent on his diet and may not be doing this now  DIET: Has seen the dietitian in 2014 and  nurse educator in 8/16       Meals: 2 meals per day 10 am and 6-7 pm. Breakfast is cereal mostly;  toast, eggs, has fruit for snacks.  Evening meal usually vegetables, tuna salad, chicken.         Oral hypoglycemic drugs the patient is taking are: Metformin 1 g       Compliance with the medical regimen:  Fair Hypoglycemia: None    Glucose monitoring: Checking 1-2 times a day        Glucometer:  Accu-Chek   Blood Glucose readings  from  home records show fasting readings with AVERAGE 150  Blood sugars range 102-208    Exercise:  walking a little, weather permitting     Weight history:  Wt Readings from Last 3 Encounters:  11/17/15 221 lb 12.8 oz (100.608 kg)  10/06/15 219 lb (99.338 kg)  08/26/15 219 lb (99.338 kg)    Glycemic control:   Lab Results  Component Value Date   HGBA1C 7.8* 11/12/2015   HGBA1C 7.3* 08/13/2015   HGBA1C 6.6 05/18/2015   Lab Results  Component Value Date   MICROALBUR 20.8* 02/10/2015   LDLCALC 58 05/18/2015   CREATININE 0.74 11/12/2015         Medication List       This list is accurate as of: 11/17/15  8:33 PM.  Always use your most recent med list.  ACCU-CHEK SOFTCLIX LANCETS lancets  Check blood sugars three times a day.     allopurinol 300 MG tablet  Commonly known as:  ZYLOPRIM  take 1 tablet by mouth once daily     amLODipine 10 MG tablet  Commonly known as:  NORVASC  Take 1 tablet (10 mg total) by mouth daily.     aspirin 81 MG tablet  Take 81 mg by mouth daily.     atorvastatin 40 MG tablet  Commonly known as:  LIPITOR  Take 1 tablet (40 mg total) by mouth daily.     B-D SINGLE USE SWABS REGULAR Pads  Use as necessary     colchicine 0.6 MG tablet  Commonly known as:  COLCRYS  take 1 tablet by mouth once daily     EASY COMFORT PEN NEEDLES 31G X 5 MM Misc  Generic drug:  Insulin Pen Needle  use as directed THREE TIMES DAILY with lantus and novolog doses     furosemide 20 MG tablet  Commonly known  as:  LASIX  TAKE 1 TABLET BY MOUTH DAILY     glucose blood test strip  Commonly known as:  ACCU-CHEK AVIVA PLUS  Use as instructed to check blood sugar 2 times per day dx code E11.9     HYDROcodone-homatropine 5-1.5 MG/5ML syrup  Commonly known as:  HYCODAN  Take 5 mLs by mouth every 6 (six) hours as needed for cough.     insulin aspart 100 UNIT/ML FlexPen  Commonly known as:  NOVOLOG FLEXPEN  Inject 20 units into skin prior to breakfast and 20 units prior to evening meal     Insulin Glargine 100 UNIT/ML Solostar Pen  Commonly known as:  LANTUS SOLOSTAR  INJECT 34 UNITS SUBCUTANEOUSLY AT BEDTIME     metFORMIN 1000 MG tablet  Commonly known as:  GLUCOPHAGE  TAKE ONE TABLET BY MOUTH TWICE DAILY. TAKE WITH A MEAL.     metoprolol 100 MG tablet  Commonly known as:  LOPRESSOR  Take 1 tablet (100 mg total) by mouth 2 (two) times daily.     nystatin-triamcinolone ointment  Commonly known as:  MYCOLOG  Apply topically 2 (two) times daily.     ramipril 10 MG capsule  Commonly known as:  ALTACE  TAKE ONE CAPSULE BY MOUTH ONCE DAILY.     sildenafil 20 MG tablet  Commonly known as:  REVATIO  Take 2 tablets (40 mg total) by mouth 3 (three) times daily.     vardenafil 20 MG tablet  Commonly known as:  LEVITRA  Take 20 mg by mouth daily as needed.        Allergies:  Allergies  Allergen Reactions  . Allopurinol     Rash    Past Medical History  Diagnosis Date  . CORONARY ARTERY DISEASE 11/23/2006  . DIABETES MELLITUS, TYPE II 11/23/2006  . GOUT 05/20/2008  . HYPERLIPIDEMIA 11/23/2006  . HYPERTENSION 11/23/2006  . 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 PNEUMONIA, LEFT LOWER LOBE 09/08/2009    Past Surgical History  Procedure Laterality Date  . Coronary angioplasty with stent placement    . Coronary artery bypass graft  2003    Family History  Problem Relation Age of Onset  . Heart disease Mother   . Heart disease Father   . Diabetes Sister   . Diabetes Brother       Social History:  reports that he has never smoked. He has never used smokeless tobacco. He reports that he does  not drink alcohol or use illicit drugs.    Review of Systems        Lipids:  LDL Adequately treated with Lipitor, this was started after his MI, HDL low        Lab Results  Component Value Date   CHOL 104 05/18/2015   HDL 28.70* 05/18/2015   LDLCALC 58 05/18/2015   TRIG 85.0 05/18/2015   CHOLHDL 4 05/18/2015       The blood pressure has been treated with Norvasc, ramipril and metoprolol   Complications: No evidence of neuropathy or nephropathy.   Diabetic foot exam was done in 5/16     LABS:  Lab on 11/12/2015  Component Date Value Ref Range Status  . Hgb A1c MFr Bld 11/12/2015 7.8* 4.6 - 6.5 % Final   Glycemic Control Guidelines for People with Diabetes:Non Diabetic:  <6%Goal of Therapy: <7%Additional Action Suggested:  >8%   . Sodium 11/12/2015 142  135 - 145 mEq/L Final  . Potassium 11/12/2015 3.9  3.5 - 5.1 mEq/L Final  . Chloride 11/12/2015 104  96 - 112 mEq/L Final  . CO2 11/12/2015 26  19 - 32 mEq/L Final  . Glucose, Bld 11/12/2015 178* 70 - 99 mg/dL Final  . BUN 11/12/2015 8  6 - 23 mg/dL Final  . Creatinine, Ser 11/12/2015 0.74  0.40 - 1.50 mg/dL Final  . Total Bilirubin 11/12/2015 0.5  0.2 - 1.2 mg/dL Final  . Alkaline Phosphatase 11/12/2015 101  39 - 117 U/L Final  . AST 11/12/2015 23  0 - 37 U/L Final  . ALT 11/12/2015 28  0 - 53 U/L Final  . Total Protein 11/12/2015 7.7  6.0 - 8.3 g/dL Final  . Albumin 11/12/2015 4.4  3.5 - 5.2 g/dL Final  . Calcium 11/12/2015 9.9  8.4 - 10.5 mg/dL Final  . GFR 11/12/2015 134.45  >60.00 mL/min Final    Physical Examination:  BP 133/63 mmHg  Pulse 67  Temp(Src) 98.1 F (36.7 C)  Resp 14  Ht 6' (1.829 m)  Wt 221 lb 12.8 oz (100.608 kg)  BMI 30.07 kg/m2  SpO2 97%        ASSESSMENT/PLAN  Diabetes type 2, uncontrolled   See history of present illness for detailed discussion of current  management, problems identified and blood sugar patterns. His blood sugars are overall getting higher again He does not have consistent readings even in the mornings Occasionally blood sugars are higher because of his going off his diet and eating desserts in the evening Previously had much better control when he was consistently on diet after discussion with nurse educator and also walking more regularly He still does not understand the need to check his blood sugars after meals to help improve his control and adjust his insulin  Since it is difficult to get an idea of how to improve his control will start continuous glucose monitoring with a FreeStyle libre sensor Discussed how to keep a diary meals while doing the sensor Meanwhile he will start taking 4-5 units of Novolog when he has a dessert in the evening Regarding walk more regularly and go to an indoor store if he cannot walk outside  We will discuss results of the download in 3 weeks  HYPERTENSION: Blood pressure is controlled     Patient Instructions  4-5 Novolog for desserts  Check blood sugars on waking up   times a week Also check blood sugars about 2 hours after a meal and do this after  different meals by rotation  Recommended blood sugar levels on waking up is 90-130 and about 2 hours after meal is 130-160  Please bring your blood sugar monitor to each visit, thank you      Counseling time on subjects discussed above is over 50% of today's 25 minute visit    Daron Breeding 11/17/2015, 8:33 PM   Note: This office note was prepared with Dragon voice recognition system technology. Any transcriptional errors that result from this process are unintentional.

## 2015-11-17 NOTE — Patient Instructions (Signed)
4-5 Novolog for desserts  Check blood sugars on waking up   times a week Also check blood sugars about 2 hours after a meal and do this after different meals by rotation  Recommended blood sugar levels on waking up is 90-130 and about 2 hours after meal is 130-160  Please bring your blood sugar monitor to each visit, thank you

## 2015-12-01 ENCOUNTER — Encounter: Payer: Medicare Other | Attending: Endocrinology | Admitting: Endocrinology

## 2015-12-01 DIAGNOSIS — Z794 Long term (current) use of insulin: Secondary | ICD-10-CM

## 2015-12-01 DIAGNOSIS — E1165 Type 2 diabetes mellitus with hyperglycemia: Secondary | ICD-10-CM

## 2015-12-02 NOTE — Progress Notes (Signed)
Sensor removed from left arm.  No signs of redness or swelling.  Download completed, and daily log reviewed. Pt. Eating only 2 meals, and taking 20u of Novolog before each meal, and Lantus 34 at 9PM>   Occasional bedtime snacking of piece of fruit, but did not put it on meal log.  Did not keep a good food record, and does not remember what was eaten.

## 2015-12-02 NOTE — Progress Notes (Deleted)
Subjective:     Patient ID: Patrick Wilcox, male   DOB: 1945/08/06, 70 y.o.   MRN: [name]  HPI   Review of Systems     Objective:   Physical Exam     Assessment:     ***    Plan:     ***

## 2015-12-08 ENCOUNTER — Other Ambulatory Visit: Payer: Self-pay | Admitting: *Deleted

## 2015-12-08 MED ORDER — RAMIPRIL 10 MG PO CAPS: ORAL_CAPSULE | ORAL | Status: DC

## 2015-12-08 MED ORDER — AMLODIPINE BESYLATE 10 MG PO TABS: 10.0000 mg | ORAL_TABLET | Freq: Every day | ORAL | Status: DC

## 2015-12-08 MED ORDER — ATORVASTATIN CALCIUM 40 MG PO TABS: 40.0000 mg | ORAL_TABLET | Freq: Every day | ORAL | Status: DC

## 2015-12-08 MED ORDER — FUROSEMIDE 20 MG PO TABS: ORAL_TABLET | ORAL | Status: DC

## 2015-12-08 NOTE — Telephone Encounter (Signed)
Rx done. 

## 2015-12-10 ENCOUNTER — Ambulatory Visit (INDEPENDENT_AMBULATORY_CARE_PROVIDER_SITE_OTHER): Payer: Medicare Other | Admitting: Endocrinology

## 2015-12-10 ENCOUNTER — Encounter: Payer: Self-pay | Admitting: Endocrinology

## 2015-12-10 VITALS — BP 158/68 | HR 71 | Temp 97.8°F | Resp 16 | Ht 72.0 in | Wt 224.4 lb

## 2015-12-10 DIAGNOSIS — Z794 Long term (current) use of insulin: Secondary | ICD-10-CM

## 2015-12-10 DIAGNOSIS — E669 Obesity, unspecified: Secondary | ICD-10-CM | POA: Diagnosis not present

## 2015-12-10 DIAGNOSIS — E1165 Type 2 diabetes mellitus with hyperglycemia: Secondary | ICD-10-CM | POA: Diagnosis not present

## 2015-12-10 MED ORDER — INSULIN DEGLUDEC 100 UNIT/ML ~~LOC~~ SOPN: 34.0000 [IU] | PEN_INJECTOR | Freq: Every day | SUBCUTANEOUS | Status: DC

## 2015-12-10 NOTE — Patient Instructions (Signed)
Change cereal in the morning to eggs and toast or toast/low-fat cheese .  Avoid bacon with high fat With this he may need to reduce Novolog insulin to 15 units IMPORTANT: Check blood sugars about 2 or 3 hours after eating breakfast Did not have to check blood sugar in the morning everyday  You do not have to have a bedtime snack, may have some peanut butter or cheese crackers if needing a snack Avoid high-fat snacks, watch calorie content of foods and snacks  When finished with LANTUS change to TRESIBA 34 units in the morning daily If the morning sugar starts getting below 110 reduce the dose to 30 units  Start walking daily

## 2015-12-10 NOTE — Progress Notes (Signed)
Patient ID: Patrick Wilcox, male   DOB: 06-Dec-1945, 70 y.o.   MRN: [name]    Reason for Appointment: Followup for Type 2 Diabetes  Referring physician: Burnice Logan  History of Present Illness:          Diagnosis: Type 2 diabetes mellitus, date of diagnosis: 2004       Past history:  At diagnosis he was having symptoms of drowsiness, difficulty breathing and blood sugar was reportedly over 600 He was initially treated with insulin but subsequently switched back to oral drugs He may have taken metformin and other medications for some time before going back on insulin His records indicate that he had been taking Amaryl and Januvia before going back on insulin in mid-2014 when his A1c had gone up over 13 Initially was given premixed insulin and then switched to Lantus and Humalog Record of his A1c indicates that his levels previously had been over 8% since 02/2012 He  had much better blood sugar control on his last visit with his changing his lifestyle and being instructed by the nurse educator  Recent history:   INSULIN regimen is described as:  Lantus 34 units daily hs, Novolog 20 ac breakfast and 20 supper    A1c has gone up further to 7.8 in 5/17, previously 7.3  Since he refuses to check his blood sugars after meals he was given the continuous glucose monitoring recording for 2 weeks  Glucose monitoring and CGM results, current management and problems identified:  His blood sugars are spiking after breakfast frequently and he is consistently eating cereal now although he is eating different kind of cereals at times.  Did not keep a record of his intake to identify what he was eating in the morning  Although his blood sugars are relatively good fasting and before supper time he has a gradual rise in his blood sugar late evening not consistently related to his evening meal around 6-7 PM  Occasionally blood sugars are higher mid afternoon even though he thinks he is not  having any snacks  He is under the impression that he has to take some food intake in the evenings when he is taking his Lantus and metformin and is usually eating a relatively high fat snack around bedtime  His highest blood sugars are around midnight and lowest blood sugars around 6-8 AM and 6-8 PM   Blood sugars are also variable on his home monitoring the morning but relatively higher earlier in the morning  He is not doing a lot of exercise  He is concerned about his weight care  DIET: Has seen the dietitian in 2014 and nurse educator in 8/16       Meals: 2 meals per day 10 am and 6-7 pm. Breakfast is cereal mostly;  toast, eggs, has fruit for snacks.  Evening meal usually vegetables, tuna salad, chicken.         Oral hypoglycemic drugs the patient is taking are: Metformin 1 g       Compliance with the medical regimen:  Fair Hypoglycemia: None    CONTINUOUS GLUCOSE MONITORING RECORD INTERPRETATION    Dates of Recording: 5/9 through 11/24/15  Sensor  summary:  Average blood glucose for the period of recording is 158 HIGHEST blood sugars on average was 202 between midnight-2 AM and lowest of 131 between 6-8 PM His blood sugars were in target range generally over 60% of the time with the best day 5/11 when it was 80% in  normal range.   They were in the hypoglycemic range only on 5/15 with low sugar duration of 11% of the time       Glycemic patterns:  Hyperglycemic episodes are occurring daily at somewhat variable times Frequently had hyperglycemic effects after about 10 PM lasting until about 1 AM except on 5/11 Blood sugars were just below 70 transiently between 4-6 PM on 5/15, no hypoglycemia His blood sugar patterns were generally variable with tendency to some postprandial hyperglycemia late morning/early afternoon at times and frequently late in the evening        Overnight periods:  Blood sugars are fairly consistently high around midnight and that gradually decreasing  to the target range without hypoglycemia     Preprandial periods:  Average reading around 6-8 AM is 134 without much variability.  Blood sugars are minimally higher before his breakfast which is around 10 AM. .  Blood sugars around 6 PM are mostly averaging about 140-150 with some variability at his evening meal       Postprandial periods:  He has not brought a food diary except for a couple of entries  With eating cereal and milk in the morning his blood sugars frequently would be high with his first meal and at least 3 times over 200.  Blood sugars are not high between 6-8 PM after his meal but tended to be gradually increasing after 8 PM and peaking around 11 PM-midnight when he has a snack       Hypoglycemia: Minimal with low normal reading only once after 4 PM     Glucose monitoring: Checking 1-2 times a day        Glucometer:  Accu-Chek    Blood Glucose readings  from  home records show fasting readings with AVERAGE 139  Blood sugars range 102-200    Exercise:  walking a little  Weight history:  Wt Readings from Last 3 Encounters:  12/10/15 224 lb 6.4 oz (101.787 kg)  11/17/15 221 lb 12.8 oz (100.608 kg)  10/06/15 219 lb (99.338 kg)    Glycemic control:   Lab Results  Component Value Date   HGBA1C 7.8* 11/12/2015   HGBA1C 7.3* 08/13/2015   HGBA1C 6.6 05/18/2015   Lab Results  Component Value Date   MICROALBUR 20.8* 02/10/2015   LDLCALC 58 05/18/2015   CREATININE 0.74 11/12/2015         Medication List       This list is accurate as of: 12/10/15  4:45 PM.  Always use your most recent med list.               ACCU-CHEK SOFTCLIX LANCETS lancets  Check blood sugars three times a day.     allopurinol 300 MG tablet  Commonly known as:  ZYLOPRIM  take 1 tablet by mouth once daily     amLODipine 10 MG tablet  Commonly known as:  NORVASC  Take 1 tablet (10 mg total) by mouth daily.     aspirin 81 MG tablet  Take 81 mg by mouth daily.     atorvastatin 40  MG tablet  Commonly known as:  LIPITOR  Take 1 tablet (40 mg total) by mouth daily.     B-D SINGLE USE SWABS REGULAR Pads  Use as necessary     colchicine 0.6 MG tablet  Commonly known as:  COLCRYS  take 1 tablet by mouth once daily     EASY COMFORT PEN NEEDLES 31G X 5 MM Misc  Generic drug:  Insulin Pen Needle  use as directed THREE TIMES DAILY with lantus and novolog doses     furosemide 20 MG tablet  Commonly known as:  LASIX  TAKE 1 TABLET BY MOUTH DAILY     glucose blood test strip  Commonly known as:  ACCU-CHEK AVIVA PLUS  Use as instructed to check blood sugar 2 times per day dx code E11.9     HYDROcodone-homatropine 5-1.5 MG/5ML syrup  Commonly known as:  HYCODAN  Take 5 mLs by mouth every 6 (six) hours as needed for cough.     insulin aspart 100 UNIT/ML FlexPen  Commonly known as:  NOVOLOG FLEXPEN  Inject 20 units into skin prior to breakfast and 20 units prior to evening meal     Insulin Degludec 100 UNIT/ML Sopn  Commonly known as:  TRESIBA FLEXTOUCH  Inject 34 Units into the skin daily.     Insulin Glargine 100 UNIT/ML Solostar Pen  Commonly known as:  LANTUS SOLOSTAR  INJECT 34 UNITS SUBCUTANEOUSLY AT BEDTIME     metFORMIN 1000 MG tablet  Commonly known as:  GLUCOPHAGE  TAKE ONE TABLET BY MOUTH TWICE DAILY. TAKE WITH A MEAL.     metoprolol 100 MG tablet  Commonly known as:  LOPRESSOR  Take 1 tablet (100 mg total) by mouth 2 (two) times daily.     nystatin-triamcinolone ointment  Commonly known as:  MYCOLOG  Apply topically 2 (two) times daily.     ramipril 10 MG capsule  Commonly known as:  ALTACE  TAKE ONE CAPSULE BY MOUTH ONCE DAILY.     sildenafil 20 MG tablet  Commonly known as:  REVATIO  Take 2 tablets (40 mg total) by mouth 3 (three) times daily.     vardenafil 20 MG tablet  Commonly known as:  LEVITRA  Take 20 mg by mouth daily as needed.        Allergies:  Allergies  Allergen Reactions  . Allopurinol     Rash    Past  Medical History  Diagnosis Date  . CORONARY ARTERY DISEASE 11/23/2006  . DIABETES MELLITUS, TYPE II 11/23/2006  . GOUT 05/20/2008  . HYPERLIPIDEMIA 11/23/2006  . HYPERTENSION 11/23/2006  . 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 PNEUMONIA, LEFT LOWER LOBE 09/08/2009    Past Surgical History  Procedure Laterality Date  . Coronary angioplasty with stent placement    . Coronary artery bypass graft  2003    Family History  Problem Relation Age of Onset  . Heart disease Mother   . Heart disease Father   . Diabetes Sister   . Diabetes Brother     Social History:  reports that he has never smoked. He has never used smokeless tobacco. He reports that he does not drink alcohol or use illicit drugs.    Review of Systems        Lipids:  LDL Adequately treated with Lipitor, this was started after his MI, HDLStays low        Lab Results  Component Value Date   CHOL 104 05/18/2015   HDL 28.70* 05/18/2015   LDLCALC 58 05/18/2015   TRIG 85.0 05/18/2015   CHOLHDL 4 05/18/2015       The blood pressure has been treated with Norvasc, ramipril and metoprolol   Complications: No evidence of neuropathy or nephropathy.   Diabetic foot exam was done in 5/16     LABS:  No visits with results within 1 Week(s) from this visit. Latest  known visit with results is:  Lab on 11/12/2015  Component Date Value Ref Range Status  . Hgb A1c MFr Bld 11/12/2015 7.8* 4.6 - 6.5 % Final   Glycemic Control Guidelines for People with Diabetes:Non Diabetic:  <6%Goal of Therapy: <7%Additional Action Suggested:  >8%   . Sodium 11/12/2015 142  135 - 145 mEq/L Final  . Potassium 11/12/2015 3.9  3.5 - 5.1 mEq/L Final  . Chloride 11/12/2015 104  96 - 112 mEq/L Final  . CO2 11/12/2015 26  19 - 32 mEq/L Final  . Glucose, Bld 11/12/2015 178* 70 - 99 mg/dL Final  . BUN 11/12/2015 8  6 - 23 mg/dL Final  . Creatinine, Ser 11/12/2015 0.74  0.40 - 1.50 mg/dL Final  . Total Bilirubin 11/12/2015 0.5  0.2 - 1.2 mg/dL  Final  . Alkaline Phosphatase 11/12/2015 101  39 - 117 U/L Final  . AST 11/12/2015 23  0 - 37 U/L Final  . ALT 11/12/2015 28  0 - 53 U/L Final  . Total Protein 11/12/2015 7.7  6.0 - 8.3 g/dL Final  . Albumin 11/12/2015 4.4  3.5 - 5.2 g/dL Final  . Calcium 11/12/2015 9.9  8.4 - 10.5 mg/dL Final  . GFR 11/12/2015 134.45  >60.00 mL/min Final    Physical Examination:  BP 158/68 mmHg  Pulse 71  Temp(Src) 97.8 F (36.6 C)  Resp 16  Ht 6' (1.829 m)  Wt 224 lb 6.4 oz (101.787 kg)  BMI 30.43 kg/m2  SpO2 97%        ASSESSMENT/PLAN  Diabetes type 2, uncontrolled   See history of present illness for detailed discussion of current management, problems identified and blood sugar patterns. He has higher blood sugars after his breakfast and also late in the evening Most of his high readings in the mornings are related to eating cereal even with taking 20 units of Novolog; tends to have relatively higher readings even in the afternoon without any snack Although he is not seeing arise in blood sugar right after supper time his blood sugars are progressively higher late in the evening probably related to decreasing effect of his Lantus is also some late evening higher fat snacks He is concerned about weight gain which may be from his taking relatively high glycemic index meals in the morning as well as more snacks at night   Results of the continuous glucose monitoring were discussed in detail  He is agreeable to changing to [name] for controlling evening readings consistently  He will adjust the dose based on fasting blood sugars and call if not controlled  He will start eating a balanced low fat breakfast instead of cereal in the morning  Avoid high-fat snacks at night  Consultation with dietitian  Increase walking for exercise daily     Patient Instructions  Change cereal in the morning to eggs and toast or toast/low-fat cheese .  Avoid bacon with high fat With this he may need  to reduce Novolog insulin to 15 units IMPORTANT: Check blood sugars about 2 or 3 hours after eating breakfast Did not have to check blood sugar in the morning everyday  You do not have to have a bedtime snack, may have some peanut butter or cheese crackers if needing a snack Avoid high-fat snacks, watch calorie content of foods and snacks  When finished with LANTUS change to TRESIBA 34 units in the morning daily If the morning sugar starts getting below 110 reduce the dose to 30 units  Start  walking daily       Counseling time on subjects discussed above is over 50% of today's 25 minute visit    Therasa Lorenzi 12/10/2015, 4:45 PM   Note: This office note was prepared with Dragon voice recognition system technology. Any transcriptional errors that result from this process are unintentional.

## 2015-12-25 ENCOUNTER — Other Ambulatory Visit: Payer: Self-pay | Admitting: *Deleted

## 2015-12-25 MED ORDER — METFORMIN HCL 1000 MG PO TABS: ORAL_TABLET | ORAL | Status: DC

## 2016-01-01 ENCOUNTER — Encounter: Payer: Self-pay | Admitting: Dietician

## 2016-01-01 ENCOUNTER — Encounter: Payer: Medicare Other | Attending: Endocrinology | Admitting: Dietician

## 2016-01-01 VITALS — Ht 72.0 in | Wt 223.0 lb

## 2016-01-01 DIAGNOSIS — Z794 Long term (current) use of insulin: Secondary | ICD-10-CM

## 2016-01-01 DIAGNOSIS — E118 Type 2 diabetes mellitus with unspecified complications: Secondary | ICD-10-CM

## 2016-01-01 DIAGNOSIS — E1165 Type 2 diabetes mellitus with hyperglycemia: Secondary | ICD-10-CM

## 2016-01-01 DIAGNOSIS — E119 Type 2 diabetes mellitus without complications: Secondary | ICD-10-CM | POA: Insufficient documentation

## 2016-01-01 DIAGNOSIS — IMO0002 Reserved for concepts with insufficient information to code with codable children: Secondary | ICD-10-CM

## 2016-01-01 NOTE — Patient Instructions (Addendum)
Change what you are drinking.  It is best if it has NO Carbohydrates.  Drink diet lemonade rather than regular (such as Crystal Light)  Decrease the apple juice to 1/2 cup (or stop drinking it) Be as active as possible most every day.  Start slow and increase to at least 30 minutes daily.  Get back to walking.  Consider buying a bike Change from cereal to an egg or cheese and toast. Fresh or frozen is better than canned Increase your intake of non starchy vegetables.  This should be 1/2 of your plate. Change to 100% whole wheat bread. A portion size of ice cream is 1/2 cup.

## 2016-01-01 NOTE — Progress Notes (Signed)
Medical Nutrition Therapy:  Appt start time: 0930 end time:  1045.   Assessment:  Primary concerns today: Patient is here alone for a referral for type 2 diabetes.  He would like to learn how to decrease his medication intake.  He states that he has gained 20 lbs since retirement and UBW 205 lbs when working.  Today's weight is 223 lbs.  Hx includes type 2 diabetes since 2004 with insulin for the past 2 years, hyperlipidemia,, HTN, CABG and MI.  He checks his blood sugar each am with readings between 123 and 223.   His last A1C was 7.8% (11/12/15) which is increased from 7.3%.   Last GFR of 134.  05/18/15:  Cholesterol 104, triglycerides 85, HDL 28, LDL 58.      Patient lives alone.  Patient does his own cooking and shopping.  He is retired from a [employer].  He never smoked.    Preferred Learning Style:   No preference indicated   Learning Readiness:   Ready  MEDICATIONS: see list to include:  novolog 20 units with breakfast and dinner, tresiba 32 units every HS, and metformin.   DIETARY INTAKE:  Usual eating pattern includes 2 meals and 2 snacks per day.  Everyday foods include eats out often.  Avoided foods include caffeine.    24-hr recall:  B (10:30-11 AM): cereal (frosted flakes, sugar smacks, raisin bran, plain cheerios with added sugar) and milk "because it is so fast", 12-16 ounces apple juice OR salmon patties, eggs, grits OR sausage and egg biscuit and potatoes and coffee with artificial sugar, cream Snk ( AM): none unless he misses breakfast (rare)  L ( PM): skips Snk (1-2 PM): ham sandwich or pimento cheese on white bread D (5:30-6:30 PM): ritz crackers with tuna salad or pimento cheese OR canned chicken and dumplings or canned spaghetti and occasional greens or instant potatoes OR fried chicken and instant potatoes Snk ( PM): ritz crackers and pimento cheese Beverages: diet gingerale, water, coffee with artificial sweetener and cream OR 16 ounces regular lemonade    Usual physical activity: yard work but uses [employer], walks 3 days per week less than 1 mile.  Used to walk 3-4 miles most days.    Estimated energy needs: 2200 calories 248 g carbohydrates 138 g protein 73 g fat  Progress Towards Goal(s):  In progress.   Nutritional Diagnosis:  NB-1.1 Food and nutrition-related knowledge deficit As related to balance of carbohydrate, protein, and fat.  As evidenced by diet hx.    Intervention:  Nutrition counseling/education related to healthy eating with diabetes, basics of meal planning using My Plate, importance of exercise and Carbohydrate counting by portion size.   . Change what you are drinking.  It is best if it has NO Carbohydrates.  Drink diet lemonade rather than regular (such as Crystal Light)  Decrease the apple juice to 1/2 cup (or stop drinking it) Be as active as possible most every day.  Start slow and increase to at least 30 minutes daily.  Get back to walking.  Consider buying a bike Change from cereal to an egg or cheese and toast. Fresh or frozen is better than canned Increase your intake of non starchy vegetables.  This should be 1/2 of your plate. Change to 100% whole wheat bread. A portion size of ice cream is 1/2 cup  Teaching Method Utilized:  Visual Auditory Hands on  Handouts given during visit include: Living Well with Diabetes Carb Counting and  Food Label handouts Meal Plan Card Label reading  HgbA1C sheet  Snack sheet  Barriers to learning/adherence to lifestyle change: none  Demonstrated degree of understanding via:  Teach Back   Monitoring/Evaluation:  Dietary intake, exercise, label reading, and body weight prn.

## 2016-01-06 ENCOUNTER — Ambulatory Visit (INDEPENDENT_AMBULATORY_CARE_PROVIDER_SITE_OTHER): Payer: Medicare Other | Admitting: Internal Medicine

## 2016-01-06 ENCOUNTER — Encounter: Payer: Self-pay | Admitting: Internal Medicine

## 2016-01-06 VITALS — BP 132/70 | HR 63 | Temp 98.2°F | Ht 72.0 in | Wt 223.0 lb

## 2016-01-06 DIAGNOSIS — E7800 Pure hypercholesterolemia, unspecified: Secondary | ICD-10-CM

## 2016-01-06 DIAGNOSIS — Z0000 Encounter for general adult medical examination without abnormal findings: Secondary | ICD-10-CM | POA: Diagnosis not present

## 2016-01-06 DIAGNOSIS — E1159 Type 2 diabetes mellitus with other circulatory complications: Secondary | ICD-10-CM | POA: Diagnosis not present

## 2016-01-06 DIAGNOSIS — I2510 Atherosclerotic heart disease of native coronary artery without angina pectoris: Secondary | ICD-10-CM | POA: Diagnosis not present

## 2016-01-06 DIAGNOSIS — I10 Essential (primary) hypertension: Secondary | ICD-10-CM

## 2016-01-06 MED ORDER — NYSTATIN-TRIAMCINOLONE 100000-0.1 UNIT/GM-% EX OINT: TOPICAL_OINTMENT | Freq: Two times a day (BID) | CUTANEOUS | Status: DC

## 2016-01-06 NOTE — Progress Notes (Signed)
Pre visit review using our clinic review tool, if applicable. No additional management support is needed unless otherwise documented below in the visit note. 

## 2016-01-06 NOTE — Patient Instructions (Signed)
Please check your hemoglobin A1c every 3 months  Limit your sodium (Salt) intake    It is important that you exercise regularly, at least 20 minutes 3 to 4 times per week.  If you develop chest pain or shortness of breath seek  medical attention.  You need to lose weight.  Consider a lower calorie diet and regular exercise.  Return in 6 months for follow-up

## 2016-01-06 NOTE — Progress Notes (Signed)
Subjective:    Patient ID: Patrick Wilcox, male    DOB: 08/18/1945, 71 y.o.   MRN: [id number]  HPI  Lab Results  Component Value Date   HGBA1C 7.8* 11/12/2015   70 year old patient who is seen today for follow-up.  He is followed by cardiology for coronary artery disease and also by endocrinology for diabetes.  Doing quite well.  He remains on basal bolus insulin.  No hypoglycemia.  He is scheduled for endocrine follow-up next month. His cardiac status has been stable.  He has a history of essential hypertension and dyslipidemia.  Remains on statin therapy.  No cardiopulmonary complaints.  Past Medical History  Diagnosis Date  . CORONARY ARTERY DISEASE 11/23/2006  . DIABETES MELLITUS, TYPE II 11/23/2006  . GOUT 05/20/2008  . HYPERLIPIDEMIA 11/23/2006  . HYPERTENSION 11/23/2006  . 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 PNEUMONIA, LEFT LOWER LOBE 09/08/2009     Social History   Social History  . Marital Status: Single    Spouse Name: N/A  . Number of Children: N/A  . Years of Education: N/A   Occupational History  . Not on file.   Social History Main Topics  . Smoking status: Never Smoker   . Smokeless tobacco: Never Used  . Alcohol Use: No  . Drug Use: No  . Sexual Activity: Not on file   Other Topics Concern  . Not on file   Social History Narrative    Past Surgical History  Procedure Laterality Date  . Coronary angioplasty with stent placement    . Coronary artery bypass graft  2003    Family History  Problem Relation Age of Onset  . Heart disease Mother   . Heart disease Father   . Diabetes Sister   . Diabetes Brother     Allergies  Allergen Reactions  . Allopurinol     Rash    Current Outpatient Prescriptions on File Prior to Visit  Medication Sig Dispense Refill  . ACCU-CHEK SOFTCLIX LANCETS lancets Check blood sugars three times a day. 100 each 12  . Alcohol Swabs (B-D SINGLE USE SWABS REGULAR) PADS Use as necessary 300 each 6  . allopurinol  (ZYLOPRIM) 300 MG tablet take 1 tablet by mouth once daily 90 tablet 1  . amLODipine (NORVASC) 10 MG tablet Take 1 tablet (10 mg total) by mouth daily. 90 tablet 1  . aspirin 81 MG tablet Take 81 mg by mouth daily.    Marland Kitchen atorvastatin (LIPITOR) 40 MG tablet Take 1 tablet (40 mg total) by mouth daily. 90 tablet 1  . colchicine (COLCRYS) 0.6 MG tablet take 1 tablet by mouth once daily 90 tablet 3  . EASY COMFORT PEN NEEDLES 31G X 5 MM MISC use as directed THREE TIMES DAILY with lantus and novolog doses  99  . furosemide (LASIX) 20 MG tablet TAKE 1 TABLET BY MOUTH DAILY 90 tablet 1  . glucose blood (ACCU-CHEK AVIVA PLUS) test strip Use as instructed to check blood sugar 2 times per day dx code E11.9 100 each 3  . HYDROcodone-homatropine (HYCODAN) 5-1.5 MG/5ML syrup Take 5 mLs by mouth every 6 (six) hours as needed for cough. 120 mL 0  . insulin aspart (NOVOLOG FLEXPEN) 100 UNIT/ML FlexPen Inject 20 units into skin prior to breakfast and 20 units prior to evening meal 15 pen 1  . Insulin Degludec (TRESIBA FLEXTOUCH) 100 UNIT/ML SOPN Inject 34 Units into the skin daily. 5 pen 1  . metFORMIN (GLUCOPHAGE) 1000 MG  tablet TAKE ONE TABLET BY MOUTH TWICE DAILY. TAKE WITH A MEAL. 180 tablet 0  . metoprolol (LOPRESSOR) 100 MG tablet Take 1 tablet (100 mg total) by mouth 2 (two) times daily. 180 tablet 3  . ramipril (ALTACE) 10 MG capsule TAKE ONE CAPSULE BY MOUTH ONCE DAILY. 90 capsule 1  . sildenafil (REVATIO) 20 MG tablet Take 2 tablets (40 mg total) by mouth 3 (three) times daily. 50 tablet 0  . vardenafil (LEVITRA) 20 MG tablet Take 20 mg by mouth daily as needed.     No current facility-administered medications on file prior to visit.    BP 132/70 mmHg  Pulse 63  Temp(Src) 98.2 F (36.8 C) (Oral)  Ht 6' (1.829 m)  Wt 223 lb (101.152 kg)  BMI 30.24 kg/m2  SpO2 99%     Review of Systems  Constitutional: Negative for fever, chills, appetite change and fatigue.  HENT: Negative for congestion,  dental problem, ear pain, hearing loss, sore throat, tinnitus, trouble swallowing and voice change.   Eyes: Negative for pain, discharge and visual disturbance.  Respiratory: Negative for cough, chest tightness, wheezing and stridor.   Cardiovascular: Negative for chest pain, palpitations and leg swelling.  Gastrointestinal: Negative for nausea, vomiting, abdominal pain, diarrhea, constipation, blood in stool and abdominal distention.  Genitourinary: Negative for urgency, hematuria, flank pain, discharge, difficulty urinating and genital sores.  Musculoskeletal: Negative for myalgias, back pain, joint swelling, arthralgias, gait problem and neck stiffness.  Skin: Negative for rash.  Neurological: Negative for dizziness, syncope, speech difficulty, weakness, numbness and headaches.  Hematological: Negative for adenopathy. Does not bruise/bleed easily.  Psychiatric/Behavioral: Negative for behavioral problems and dysphoric mood. The patient is not nervous/anxious.        Objective:   Physical Exam  Constitutional: He is oriented to person, place, and time. He appears well-developed.  HENT:  Head: Normocephalic.  Right Ear: External ear normal.  Left Ear: External ear normal.  Eyes: Conjunctivae and EOM are normal.  Neck: Normal range of motion.  Cardiovascular: Normal rate and normal heart sounds.   Pulmonary/Chest: Breath sounds normal.  Abdominal: Bowel sounds are normal.  Musculoskeletal: Normal range of motion. He exhibits no edema or tenderness.  Neurological: He is alert and oriented to person, place, and time.  Psychiatric: He has a normal mood and affect. His behavior is normal.          Assessment & Plan:   Diabetes mellitus.  Follow-up endocrinology Essential hypertension, stable Coronary artery disease, stable Dyslipidemia.  Continue statin therapy  No change in medical regimen Follow-up 6 months or as needed  Nyoka Cowden, MD

## 2016-01-26 ENCOUNTER — Other Ambulatory Visit: Payer: Medicare Other

## 2016-01-27 ENCOUNTER — Other Ambulatory Visit (INDEPENDENT_AMBULATORY_CARE_PROVIDER_SITE_OTHER): Payer: Medicare Other

## 2016-01-27 ENCOUNTER — Other Ambulatory Visit: Payer: Self-pay

## 2016-01-27 DIAGNOSIS — E1165 Type 2 diabetes mellitus with hyperglycemia: Secondary | ICD-10-CM | POA: Diagnosis not present

## 2016-01-27 DIAGNOSIS — Z794 Long term (current) use of insulin: Secondary | ICD-10-CM | POA: Diagnosis not present

## 2016-01-27 LAB — GLUCOSE, RANDOM: Glucose, Bld: 64 mg/dL — ABNORMAL LOW (ref 70–99)

## 2016-01-27 MED ORDER — INSULIN DEGLUDEC 100 UNIT/ML ~~LOC~~ SOPN: 34.0000 [IU] | PEN_INJECTOR | Freq: Every day | SUBCUTANEOUS | Status: DC

## 2016-01-27 MED ORDER — INSULIN ASPART 100 UNIT/ML FLEXPEN: PEN_INJECTOR | SUBCUTANEOUS | Status: DC

## 2016-01-27 NOTE — Telephone Encounter (Signed)
Fax refill request for Novolog sent into pharmacy.

## 2016-01-28 LAB — FRUCTOSAMINE: Fructosamine: 257 umol/L (ref 0–285)

## 2016-01-29 ENCOUNTER — Ambulatory Visit (INDEPENDENT_AMBULATORY_CARE_PROVIDER_SITE_OTHER): Payer: Medicare Other | Admitting: Endocrinology

## 2016-01-29 ENCOUNTER — Ambulatory Visit: Payer: Medicare Other | Admitting: Endocrinology

## 2016-01-29 ENCOUNTER — Encounter: Payer: Self-pay | Admitting: Endocrinology

## 2016-01-29 VITALS — BP 142/80 | HR 62 | Ht 73.0 in | Wt 222.0 lb

## 2016-01-29 DIAGNOSIS — Z794 Long term (current) use of insulin: Secondary | ICD-10-CM | POA: Diagnosis not present

## 2016-01-29 DIAGNOSIS — E1165 Type 2 diabetes mellitus with hyperglycemia: Secondary | ICD-10-CM | POA: Diagnosis not present

## 2016-01-29 NOTE — Progress Notes (Signed)
Patient ID: Patrick Wilcox, male   DOB: Jan 08, 1946, 70 y.o.   MRN: [name]    Reason for Appointment: Followup for Type 2 Diabetes  Referring physician: Burnice Logan  History of Present Illness:          Diagnosis: Type 2 diabetes mellitus, date of diagnosis: 2004       Past history:  At diagnosis he was having symptoms of drowsiness, difficulty breathing and blood sugar was reportedly over 600 He was initially treated with insulin but subsequently switched back to oral drugs He may have taken metformin and other medications for some time before going back on insulin His records indicate that he had been taking Amaryl and Januvia before going back on insulin in mid-2014 when his A1c had gone up over 13 Initially was given premixed insulin and then switched to Lantus and Humalog Record of his A1c indicates that his levels previously had been over 8% since 02/2012 He  had much better blood sugar control on his last visit with his changing his lifestyle and being instructed by the nurse educator  Recent history:   INSULIN regimen is described as:  34 units Tresiba daily hs, Novolog 20 ac breakfast and 20 supper    A1c had gone up further to 7.8 in 5/17, previously 7.3 His Lantus was changed to [name] after his CGM showed blood sugar rising after about 8 PM in the evening prior to his Lantus dose  Glucose monitoring and CGM results, current management and problems identified:  His blood sugars are being checked only in the morning at home only despite repeated reminders to have them check readings after meals  He is taking about the same amount of [name] as Lantus  FASTING blood sugars are overall fairly good, averaging 136  He has seen the dietitian and is making changes in his diet including adding protein at breakfast and cutting back on juices  His lab glucose was 64 after breakfast but he was asymptomatic  He has unusually high reading of 262 at 12 noon, he does  not know if he forgot his morning insulin, later that day it was 97  His FRUCTOSAMINE indicates improved control, now 257  He says that his [name] and is difficult to use as it gets stuck while doing the injection and takes a long time but he has not called his pharmacy about this.  Also appears to be leaving his pen needle on the pen after the injection  DIET: Has seen the dietitian in 2014 and nurse educator in 8/16       Meals: 2 meals per day 10 am and 6-7 pm. Breakfast is cereal +eggs;  toast, eggs, has fruit for snacks.  Evening meal usually vegetables, tuna salad, chicken.         Oral hypoglycemic drugs the patient is taking are: Metformin 1 g       Compliance with the medical regimen:  Fair Hypoglycemia: None    Glucose monitoring: Checking 1 times a day        Glucometer:  Accu-Chek    Blood Glucose readings  from  home records show fasting readings with AVERAGE 136, range 110-168     Exercise:  walking a little more recently  Weight history:  Wt Readings from Last 3 Encounters:  01/29/16 222 lb (100.699 kg)  01/06/16 223 lb (101.152 kg)  01/01/16 223 lb (101.152 kg)    Glycemic control:   Lab Results  Component Value Date  HGBA1C 7.8* 11/12/2015   HGBA1C 7.3* 08/13/2015   HGBA1C 6.6 05/18/2015   Lab Results  Component Value Date   MICROALBUR 20.8* 02/10/2015   LDLCALC 58 05/18/2015   CREATININE 0.74 11/12/2015         Medication List       This list is accurate as of: 01/29/16  3:10 PM.  Always use your most recent med list.               ACCU-CHEK SOFTCLIX LANCETS lancets  Check blood sugars three times a day.     allopurinol 300 MG tablet  Commonly known as:  ZYLOPRIM  take 1 tablet by mouth once daily     amLODipine 10 MG tablet  Commonly known as:  NORVASC  Take 1 tablet (10 mg total) by mouth daily.     aspirin 81 MG tablet  Take 81 mg by mouth daily.     atorvastatin 40 MG tablet  Commonly known as:  LIPITOR  Take 1 tablet (40  mg total) by mouth daily.     B-D SINGLE USE SWABS REGULAR Pads  Use as necessary     colchicine 0.6 MG tablet  Commonly known as:  COLCRYS  take 1 tablet by mouth once daily     EASY COMFORT PEN NEEDLES 31G X 5 MM Misc  Generic drug:  Insulin Pen Needle  use as directed THREE TIMES DAILY with lantus and novolog doses     furosemide 20 MG tablet  Commonly known as:  LASIX  TAKE 1 TABLET BY MOUTH DAILY     glucose blood test strip  Commonly known as:  ACCU-CHEK AVIVA PLUS  Use as instructed to check blood sugar 2 times per day dx code E11.9     HYDROcodone-homatropine 5-1.5 MG/5ML syrup  Commonly known as:  HYCODAN  Take 5 mLs by mouth every 6 (six) hours as needed for cough.     insulin aspart 100 UNIT/ML FlexPen  Commonly known as:  NOVOLOG FLEXPEN  Inject 20 units into skin prior to breakfast and 20 units prior to evening meal     Insulin Degludec 100 UNIT/ML Sopn  Commonly known as:  TRESIBA FLEXTOUCH  Inject 34 Units into the skin daily.     metFORMIN 1000 MG tablet  Commonly known as:  GLUCOPHAGE  TAKE ONE TABLET BY MOUTH TWICE DAILY. TAKE WITH A MEAL.     metoprolol 100 MG tablet  Commonly known as:  LOPRESSOR  Take 1 tablet (100 mg total) by mouth 2 (two) times daily.     nystatin-triamcinolone ointment  Commonly known as:  MYCOLOG  Apply topically 2 (two) times daily.     ramipril 10 MG capsule  Commonly known as:  ALTACE  TAKE ONE CAPSULE BY MOUTH ONCE DAILY.     sildenafil 20 MG tablet  Commonly known as:  REVATIO  Take 2 tablets (40 mg total) by mouth 3 (three) times daily.     vardenafil 20 MG tablet  Commonly known as:  LEVITRA  Take 20 mg by mouth daily as needed.        Allergies:  Allergies  Allergen Reactions  . Allopurinol     Rash    Past Medical History  Diagnosis Date  . CORONARY ARTERY DISEASE 11/23/2006  . DIABETES MELLITUS, TYPE II 11/23/2006  . GOUT 05/20/2008  . HYPERLIPIDEMIA 11/23/2006  . HYPERTENSION 11/23/2006  .  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 PNEUMONIA, LEFT LOWER LOBE 09/08/2009  Past Surgical History  Procedure Laterality Date  . Coronary angioplasty with stent placement    . Coronary artery bypass graft  2003    Family History  Problem Relation Age of Onset  . Heart disease Mother   . Heart disease Father   . Diabetes Sister   . Diabetes Brother     Social History:  reports that he has never smoked. He has never used smokeless tobacco. He reports that he does not drink alcohol or use illicit drugs.    Review of Systems        Lipids:  LDL Adequately treated with Lipitor, this was started after his MI, HDL stays low       Lab Results  Component Value Date   CHOL 104 05/18/2015   HDL 28.70* 05/18/2015   LDLCALC 58 05/18/2015   TRIG 85.0 05/18/2015   CHOLHDL 4 05/18/2015       The blood pressure has been treated with Norvasc, ramipril and metoprolol   Complications: No evidence of neuropathy or nephropathy.   Diabetic foot exam was done in2/2017     LABS:  Lab on 01/27/2016  Component Date Value Ref Range Status  . Glucose, Bld 01/27/2016 64* 70 - 99 mg/dL Final  . Fructosamine 01/27/2016 257  0 - 285 umol/L Final   Comment: Published reference interval for apparently healthy subjects between age 70 and 51 is 2 - 285 umol/L and in a poorly controlled diabetic population is 228 - 563 umol/L with a mean of 396 umol/L.     Physical Examination:  BP 142/80 mmHg  Pulse 62  Ht 6\' 1"  (1.854 m)  Wt 222 lb (100.699 kg)  BMI 29.30 kg/m2  SpO2 97%        ASSESSMENT/PLAN  Diabetes type 2, uncontrolled   See history of present illness for detailed discussion of current management, problems identified and blood sugar patterns.  He is now on [name] with probably better control compared to Lantus as judged by his fructosamine of 257, baseline A1c 7.8  He has about same fasting readings and may be getting better blood sugar control over 24 hours with  [name], previously glucose was high before his bedtime dose Again he forgets to do readings after meals and not clear if his insulin doses appropriate are not His glucose was 64 after breakfast and likely needs less insulin the morning especially with his trying to get protein with breakfast Also benefiting from consultation with dietitian  He will continue the same dose of [name] He will check with the pharmacy about problems with his insulin pen He will need to remove the pen needle after use He needs to start checking blood sugars after meals consistently and less in the morning     Patient Instructions  18 Novolog in am, 20 at supper  MORE SUGARS AFTER ,MEALS  TAKE OFF Pen needle after use     Counseling time on subjects discussed above is over 50% of today's 25 minute visit    Patrick Wilcox 01/29/2016, 3:10 PM   Note: This office note was prepared with Estate agent. Any transcriptional errors that result from this process are unintentional.

## 2016-01-29 NOTE — Patient Instructions (Signed)
18 Novolog in am, 20 at supper  MORE SUGARS AFTER ,MEALS  TAKE OFF Pen needle after use

## 2016-02-03 ENCOUNTER — Other Ambulatory Visit: Payer: Self-pay | Admitting: Emergency Medicine

## 2016-02-03 ENCOUNTER — Telehealth: Payer: Self-pay | Admitting: Emergency Medicine

## 2016-02-03 MED ORDER — ALLOPURINOL 300 MG PO TABS: ORAL_TABLET | ORAL | 1 refills | Status: DC

## 2016-02-03 NOTE — Telephone Encounter (Signed)
Received paper refill for Allopurinol 300mg  tab.   Noted Allergy to Allopurinol   Called pt to confirm allergy, pt states that he takes allopurinol despite the side effects.

## 2016-03-15 ENCOUNTER — Other Ambulatory Visit: Payer: Self-pay | Admitting: *Deleted

## 2016-03-17 ENCOUNTER — Encounter: Payer: Self-pay | Admitting: *Deleted

## 2016-03-17 ENCOUNTER — Other Ambulatory Visit: Payer: Self-pay | Admitting: *Deleted

## 2016-03-17 LAB — HM DIABETES EYE EXAM

## 2016-03-17 MED ORDER — EASY COMFORT PEN NEEDLES 31G X 5 MM MISC: 3 refills | Status: DC

## 2016-03-28 ENCOUNTER — Other Ambulatory Visit: Payer: Medicare Other

## 2016-03-30 ENCOUNTER — Other Ambulatory Visit: Payer: Self-pay | Admitting: *Deleted

## 2016-03-30 ENCOUNTER — Other Ambulatory Visit: Payer: Self-pay

## 2016-03-30 ENCOUNTER — Other Ambulatory Visit (INDEPENDENT_AMBULATORY_CARE_PROVIDER_SITE_OTHER): Payer: Medicare Other

## 2016-03-30 DIAGNOSIS — Z794 Long term (current) use of insulin: Secondary | ICD-10-CM

## 2016-03-30 DIAGNOSIS — E1165 Type 2 diabetes mellitus with hyperglycemia: Secondary | ICD-10-CM | POA: Diagnosis not present

## 2016-03-30 LAB — COMPREHENSIVE METABOLIC PANEL
ALT: 30 U/L (ref 0–53)
AST: 25 U/L (ref 0–37)
Albumin: 4.1 g/dL (ref 3.5–5.2)
Alkaline Phosphatase: 101 U/L (ref 39–117)
BUN: 10 mg/dL (ref 6–23)
CO2: 31 mEq/L (ref 19–32)
Calcium: 9.2 mg/dL (ref 8.4–10.5)
Chloride: 102 mEq/L (ref 96–112)
Creatinine, Ser: 0.75 mg/dL (ref 0.40–1.50)
GFR: 132.24 mL/min (ref >60.00)
Glucose, Bld: 114 mg/dL — ABNORMAL HIGH (ref 70–99)
Potassium: 3.8 mEq/L (ref 3.5–5.1)
Sodium: 138 mEq/L (ref 135–145)
Total Bilirubin: 0.6 mg/dL (ref 0.2–1.2)
Total Protein: 7.4 g/dL (ref 6.0–8.3)

## 2016-03-30 LAB — LIPID PANEL
Cholesterol: 104 mg/dL (ref 0–200)
HDL: 32.6 mg/dL — ABNORMAL LOW (ref >39.00)
LDL Cholesterol: 57 mg/dL (ref 0–99)
NonHDL: 71.25
Total CHOL/HDL Ratio: 3
Triglycerides: 70 mg/dL (ref 0.0–149.0)
VLDL: 14 mg/dL (ref 0.0–40.0)

## 2016-03-30 LAB — HEMOGLOBIN A1C: Hgb A1c MFr Bld: 7.5 % — ABNORMAL HIGH (ref 4.6–6.5)

## 2016-03-30 LAB — MICROALBUMIN / CREATININE URINE RATIO
Creatinine,U: 283.1 mg/dL
Microalb Creat Ratio: 11.9 mg/g (ref 0.0–30.0)
Microalb, Ur: 33.7 mg/dL — ABNORMAL HIGH (ref 0.0–1.9)

## 2016-03-30 MED ORDER — METOPROLOL TARTRATE 100 MG PO TABS: 100.0000 mg | ORAL_TABLET | Freq: Two times a day (BID) | ORAL | 3 refills | Status: DC

## 2016-03-30 MED ORDER — INSULIN ASPART 100 UNIT/ML FLEXPEN: PEN_INJECTOR | SUBCUTANEOUS | 1 refills | Status: DC

## 2016-03-31 ENCOUNTER — Encounter: Payer: Self-pay | Admitting: Endocrinology

## 2016-03-31 ENCOUNTER — Ambulatory Visit (INDEPENDENT_AMBULATORY_CARE_PROVIDER_SITE_OTHER): Payer: Medicare Other | Admitting: Endocrinology

## 2016-03-31 VITALS — BP 132/70 | HR 69 | Temp 98.0°F | Resp 16 | Ht 73.0 in | Wt 229.0 lb

## 2016-03-31 DIAGNOSIS — Z794 Long term (current) use of insulin: Secondary | ICD-10-CM

## 2016-03-31 DIAGNOSIS — I10 Essential (primary) hypertension: Secondary | ICD-10-CM

## 2016-03-31 DIAGNOSIS — E1165 Type 2 diabetes mellitus with hyperglycemia: Secondary | ICD-10-CM

## 2016-03-31 DIAGNOSIS — Z23 Encounter for immunization: Secondary | ICD-10-CM | POA: Diagnosis not present

## 2016-03-31 DIAGNOSIS — E782 Mixed hyperlipidemia: Secondary | ICD-10-CM | POA: Diagnosis not present

## 2016-03-31 DIAGNOSIS — R635 Abnormal weight gain: Secondary | ICD-10-CM

## 2016-03-31 NOTE — Progress Notes (Signed)
Patient ID: Patrick Wilcox, male   DOB: [birth year], 70 y.o.   MRN: [name]    Reason for Appointment: Followup for Type 2 Diabetes  Referring physician: Burnice Logan  History of Present Illness:          Diagnosis: Type 2 diabetes mellitus, date of diagnosis: 2004       Past history:  At diagnosis he was having symptoms of drowsiness, difficulty breathing and blood sugar was reportedly over 600 He was initially treated with insulin but subsequently switched back to oral drugs He may have taken metformin and other medications for some time before going back on insulin His records indicate that he had been taking Amaryl and Januvia before going back on insulin in mid-2014 when his A1c had gone up over 13 Initially was given premixed insulin and then switched to Lantus and Humalog Record of his A1c indicates that his levels previously had been over 8% since 02/2012 He  had much better blood sugar control on his last visit with his changing his lifestyle and being instructed by the nurse educator  Recent history:   INSULIN regimen is described as:  34 units Tresiba daily hs, Novolog 20 ac breakfast and 20 supper    A1c had gone up further to 7.8 in 5/17 but is now better at 7.5 His Lantus was changed to [name] after his CGM showed blood sugar rising after about 8 PM in the evening prior to his Lantus dose  Glucose monitoring, current management and problems identified:  His blood sugars are being checked more consistently at various times as directed  However not clear if some of his readings around 6-7 PMR after eating  He does not know why his blood sugars are fluctuating excessively during the morning hours especially 2-3 weeks ago  Occasionally blood sugars are high after breakfast also  Blood sugars are relatively better after supper and once he felt a little hypoglycemic with low normal blood sugars; he then did not take his evening insulin or medications causing  the sugar to be nearly 300 at night  He has not been doing any exercise and has gained weight, may have been snacking at times  Occasionally will go off his diet causing higher readings  He again says that his [name] and is difficult to use as it gets stuck while doing the injection and takes a long time  DIET: Has seen the dietitian in 2014 and nurse educator in 8/16       Meals: 2 meals per day 10 am and 6-7 pm. Breakfast is cereal +eggs;  toast, eggs, has fruit for snacks.  Evening meal usually vegetables, tuna salad, chicken.         Oral hypoglycemic drugs the patient is taking are: Metformin 1 g       Compliance with the medical regimen:  Fair Hypoglycemia: None    Glucose monitoring: Checking 1 times a day        Glucometer:  Accu-Chek    Blood Glucose readings  from  download  Mean values apply above for all meters except median for One Touch  PRE-MEAL Fasting Lunch Dinner Bedtime Overall  Glucose range: 127-232   74-220     Mean/median: 172     162+/-50    POST-MEAL PC Breakfast PC Lunch PC Dinner  Glucose range: 137-342   109-244   Mean/median:   140       Exercise:  walking a little less recently  Weight  history:  Wt Readings from Last 3 Encounters:  03/31/16 229 lb (103.9 kg)  01/29/16 222 lb (100.7 kg)  01/06/16 223 lb (101.2 kg)    Glycemic control:   Lab Results  Component Value Date   HGBA1C 7.5 (H) 03/30/2016   HGBA1C 7.8 (H) 11/12/2015   HGBA1C 7.3 (H) 08/13/2015   Lab Results  Component Value Date   MICROALBUR 33.7 (H) 03/30/2016   LDLCALC 57 03/30/2016   CREATININE 0.75 03/30/2016         Medication List       Accurate as of 03/31/16 12:48 PM. Always use your most recent med list.          ACCU-CHEK SOFTCLIX LANCETS lancets Check blood sugars three times a day.   allopurinol 300 MG tablet Commonly known as:  ZYLOPRIM take 1 tablet by mouth once daily   amLODipine 10 MG tablet Commonly known as:  NORVASC Take 1 tablet  (10 mg total) by mouth daily.   aspirin 81 MG tablet Take 81 mg by mouth daily.   atorvastatin 40 MG tablet Commonly known as:  LIPITOR Take 1 tablet (40 mg total) by mouth daily.   B-D SINGLE USE SWABS REGULAR Pads Use as necessary   clotrimazole-betamethasone cream Commonly known as:  LOTRISONE   colchicine 0.6 MG tablet Commonly known as:  COLCRYS take 1 tablet by mouth once daily   EASY COMFORT PEN NEEDLES 31G X 5 MM Misc Generic drug:  Insulin Pen Needle use as directed THREE TIMES DAILY with lantus and novolog doses   furosemide 20 MG tablet Commonly known as:  LASIX TAKE 1 TABLET BY MOUTH DAILY   glucose blood test strip Commonly known as:  ACCU-CHEK AVIVA PLUS Use as instructed to check blood sugar 2 times per day dx code E11.9   HYDROcodone-homatropine 5-1.5 MG/5ML syrup Commonly known as:  HYCODAN Take 5 mLs by mouth every 6 (six) hours as needed for cough.   insulin aspart 100 UNIT/ML FlexPen Commonly known as:  NOVOLOG FLEXPEN Inject 20 units into skin prior to breakfast and 20 units prior to evening meal   insulin degludec 100 UNIT/ML Sopn FlexTouch Pen Commonly known as:  TRESIBA FLEXTOUCH Inject 34 Units into the skin daily.   metFORMIN 1000 MG tablet Commonly known as:  GLUCOPHAGE TAKE ONE TABLET BY MOUTH TWICE DAILY. TAKE WITH A MEAL.   metoprolol 100 MG tablet Commonly known as:  LOPRESSOR Take 1 tablet (100 mg total) by mouth 2 (two) times daily.   nystatin-triamcinolone ointment Commonly known as:  MYCOLOG Apply topically 2 (two) times daily.   ramipril 10 MG capsule Commonly known as:  ALTACE TAKE ONE CAPSULE BY MOUTH ONCE DAILY.   sildenafil 20 MG tablet Commonly known as:  REVATIO Take 2 tablets (40 mg total) by mouth 3 (three) times daily.   vardenafil 20 MG tablet Commonly known as:  LEVITRA Take 20 mg by mouth daily as needed.       Allergies:  Allergies  Allergen Reactions  . Allopurinol     Rash    Past Medical  History:  Diagnosis Date  . CORONARY ARTERY DISEASE 11/23/2006  . DIABETES MELLITUS, TYPE II 11/23/2006  . GOUT 05/20/2008  . HYPERLIPIDEMIA 11/23/2006  . HYPERTENSION 11/23/2006  . 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 PNEUMONIA, LEFT LOWER LOBE 09/08/2009    Past Surgical History:  Procedure Laterality Date  . CORONARY ANGIOPLASTY WITH STENT PLACEMENT    . CORONARY ARTERY BYPASS GRAFT  2003    Family History  Problem Relation Age of Onset  . Heart disease Mother   . Heart disease Father   . Diabetes Sister   . Diabetes Brother     Social History:  reports that he has never smoked. He has never used smokeless tobacco. He reports that he does not drink alcohol or use drugs.    Review of Systems        Lipids:  LDL Adequately treated with Lipitor, this was started after his MI, HDL stays low       Lab Results  Component Value Date   CHOL 104 03/30/2016   HDL 32.60 (L) 03/30/2016   LDLCALC 57 03/30/2016   TRIG 70.0 03/30/2016   CHOLHDL 3 03/30/2016       The blood pressure has been treated with Norvasc, ramipril and metoprolol   Complications: No evidence of neuropathy or nephropathy.   Diabetic foot exam was done in2/2017     LABS:  Lab on 03/30/2016  Component Date Value Ref Range Status  . Hgb A1c MFr Bld 03/30/2016 7.5* 4.6 - 6.5 % Final  . Sodium 03/30/2016 138  135 - 145 mEq/L Final  . Potassium 03/30/2016 3.8  3.5 - 5.1 mEq/L Final  . Chloride 03/30/2016 102  96 - 112 mEq/L Final  . CO2 03/30/2016 31  19 - 32 mEq/L Final  . Glucose, Bld 03/30/2016 114* 70 - 99 mg/dL Final  . BUN 03/30/2016 10  6 - 23 mg/dL Final  . Creatinine, Ser 03/30/2016 0.75  0.40 - 1.50 mg/dL Final  . Total Bilirubin 03/30/2016 0.6  0.2 - 1.2 mg/dL Final  . Alkaline Phosphatase 03/30/2016 101  39 - 117 U/L Final  . AST 03/30/2016 25  0 - 37 U/L Final  . ALT 03/30/2016 30  0 - 53 U/L Final  . Total Protein 03/30/2016 7.4  6.0 - 8.3 g/dL Final  . Albumin 03/30/2016 4.1  3.5 -  5.2 g/dL Final  . Calcium 03/30/2016 9.2  8.4 - 10.5 mg/dL Final  . GFR 03/30/2016 132.24  >60.00 mL/min Final  . Microalb, Ur 03/30/2016 33.7* 0.0 - 1.9 mg/dL Final  . Creatinine,U 03/30/2016 283.1  mg/dL Final  . Microalb Creat Ratio 03/30/2016 11.9  0.0 - 30.0 mg/g Final  . Cholesterol 03/30/2016 104  0 - 200 mg/dL Final  . Triglycerides 03/30/2016 70.0  0.0 - 149.0 mg/dL Final  . HDL 03/30/2016 32.60* >39.00 mg/dL Final  . VLDL 03/30/2016 14.0  0.0 - 40.0 mg/dL Final  . LDL Cholesterol 03/30/2016 57  0 - 99 mg/dL Final  . Total CHOL/HDL Ratio 03/30/2016 3   Final  . NonHDL 03/30/2016 71.25   Final    Physical Examination:  BP 132/70   Pulse 69   Temp 98 F (36.7 C)   Resp 16   Ht 6\' 1"  (1.854 m)   Wt 229 lb (103.9 kg)   SpO2 97%   BMI 30.21 kg/m         ASSESSMENT/PLAN  Diabetes type 2, On insulin   See history of present illness for detailed discussion of current management, problems identified and blood sugar patterns.  Although his A1c is slightly better at 7.5 his blood sugars are erratic in the last month or so This is likely to be from inconsistent diet and not exercising Also has gained weight  Fasting readings are variable and not clear why, the last 2 readings are reasonably good  He will continue the  same dose of [name] He will check with the pharmacy about problems with his insulin pen He will try to take at least 2 units less on his NovoLog at suppertime unless eating a larger meal or more carbohydrates More consistent diet Advised him to start brisk walking at least 20 minutes every other day Check readings about 2 hours after various meals  Counseling time on subjects discussed above is over 50% of today's 25 minute visit     Patient Instructions  18 Novolog at supper for average low carb meal           Lyzbeth Genrich 03/31/2016, 12:48 PM   Note: This office note was prepared with Estate agent. Any  transcriptional errors that result from this process are unintentional.

## 2016-03-31 NOTE — Patient Instructions (Signed)
18 Novolog at supper for average low carb meal

## 2016-04-25 ENCOUNTER — Other Ambulatory Visit: Payer: Self-pay | Admitting: *Deleted

## 2016-04-25 MED ORDER — INSULIN DEGLUDEC 100 UNIT/ML ~~LOC~~ SOPN: 34.0000 [IU] | PEN_INJECTOR | Freq: Every day | SUBCUTANEOUS | 2 refills | Status: DC

## 2016-04-28 ENCOUNTER — Other Ambulatory Visit: Payer: Self-pay | Admitting: *Deleted

## 2016-04-28 MED ORDER — COLCHICINE 0.6 MG PO TABS: ORAL_TABLET | ORAL | 3 refills | Status: DC

## 2016-05-05 ENCOUNTER — Other Ambulatory Visit: Payer: Self-pay | Admitting: Internal Medicine

## 2016-05-23 ENCOUNTER — Other Ambulatory Visit: Payer: Self-pay | Admitting: Internal Medicine

## 2016-06-20 ENCOUNTER — Other Ambulatory Visit: Payer: Self-pay | Admitting: Internal Medicine

## 2016-06-28 ENCOUNTER — Other Ambulatory Visit: Payer: Self-pay

## 2016-06-28 ENCOUNTER — Other Ambulatory Visit (INDEPENDENT_AMBULATORY_CARE_PROVIDER_SITE_OTHER): Payer: Medicare Other

## 2016-06-28 DIAGNOSIS — Z794 Long term (current) use of insulin: Secondary | ICD-10-CM

## 2016-06-28 DIAGNOSIS — E1165 Type 2 diabetes mellitus with hyperglycemia: Secondary | ICD-10-CM

## 2016-06-28 LAB — BASIC METABOLIC PANEL
BUN: 12 mg/dL (ref 6–23)
CO2: 29 mEq/L (ref 19–32)
Calcium: 9.8 mg/dL (ref 8.4–10.5)
Chloride: 104 mEq/L (ref 96–112)
Creatinine, Ser: 0.87 mg/dL (ref 0.40–1.50)
GFR: 111.34 mL/min (ref >60.00)
Glucose, Bld: 229 mg/dL — ABNORMAL HIGH (ref 70–99)
Potassium: 4.5 mEq/L (ref 3.5–5.1)
Sodium: 140 mEq/L (ref 135–145)

## 2016-06-28 LAB — HEMOGLOBIN A1C: Hgb A1c MFr Bld: 8.1 % — ABNORMAL HIGH (ref 4.6–6.5)

## 2016-06-28 MED ORDER — INSULIN ASPART 100 UNIT/ML FLEXPEN: PEN_INJECTOR | SUBCUTANEOUS | 1 refills | Status: DC

## 2016-06-29 ENCOUNTER — Ambulatory Visit (INDEPENDENT_AMBULATORY_CARE_PROVIDER_SITE_OTHER): Payer: Medicare Other | Admitting: Internal Medicine

## 2016-06-29 ENCOUNTER — Encounter: Payer: Self-pay | Admitting: Internal Medicine

## 2016-06-29 VITALS — BP 140/80 | HR 68 | Temp 98.4°F | Ht 73.0 in | Wt 223.0 lb

## 2016-06-29 DIAGNOSIS — I10 Essential (primary) hypertension: Secondary | ICD-10-CM | POA: Diagnosis not present

## 2016-06-29 DIAGNOSIS — E1159 Type 2 diabetes mellitus with other circulatory complications: Secondary | ICD-10-CM

## 2016-06-29 DIAGNOSIS — E7800 Pure hypercholesterolemia, unspecified: Secondary | ICD-10-CM | POA: Diagnosis not present

## 2016-06-29 NOTE — Patient Instructions (Signed)
Limit your sodium (Salt) intake  Follow-up endocrinology   Please check your hemoglobin A1c every 3 months  Limit your sodium (Salt) intake  Please check your blood pressure on a regular basis.  If it is consistently greater than 150/90, please make an office appointment.    It is important that you exercise regularly, at least 20 minutes 3 to 4 times per week.  If you develop chest pain or shortness of breath seek  medical attention.  You need to lose weight.  Consider a lower calorie diet and regular exercise.

## 2016-06-29 NOTE — Progress Notes (Signed)
   Subjective:    Patient ID: Patrick Wilcox, male    DOB: [birth year], 70 y.o.   MRN: [id number]  HPI  Lab Results  Component Value Date   HGBA1C 8.1 (H) 06/28/2016    Wt Readings from Last 3 Encounters:  06/29/16 223 lb (101.2 kg)  03/31/16 229 lb (103.9 kg)  01/29/16 222 lb (100.7 kg)   BP Readings from Last 3 Encounters:  06/29/16 140/80  03/31/16 132/70  01/29/16 (!) 142/80    Review of Systems    as above Objective:   Physical Exam   As above     Assessment & Plan:  As above

## 2016-06-29 NOTE — Progress Notes (Signed)
Pre visit review using our clinic review tool, if applicable. No additional management support is needed unless otherwise documented below in the visit note. 

## 2016-06-29 NOTE — Progress Notes (Signed)
Subjective:    Patient ID: Patrick Wilcox, male    DOB: 08-Mar-1946, 70 y.o.   MRN: [id number]  HPI  70 year old patient who is seen today for his six-month follow-up.  He has type 2 diabetes.  Lab Results  Component Value Date   HGBA1C 8.1 (H) 06/28/2016   Recent hemoglobin A1c was not well controlled.  He is scheduled for endocrinology follow-up in 2 days.  He states fasting blood sugars are generally fairly well controlled.  He states fasting blood sugar earlier today was 81 He is on 20 units at mealtime insulin twice daily He has essential hypertension.  He remains on statin therapy for dyslipidemia. No cardiopulmonary complaints.  He does have a history of coronary artery disease.  He has a history of gout which has been stable  Past Medical History:  Diagnosis Date  . CORONARY ARTERY DISEASE 11/23/2006  . DIABETES MELLITUS, TYPE II 11/23/2006  . GOUT 05/20/2008  . HYPERLIPIDEMIA 11/23/2006  . HYPERTENSION 11/23/2006  . 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 PNEUMONIA, LEFT LOWER LOBE 09/08/2009     Social History   Social History  . Marital status: Single    Spouse name: N/A  . Number of children: N/A  . Years of education: N/A   Occupational History  . Not on file.   Social History Main Topics  . Smoking status: Never Smoker  . Smokeless tobacco: Never Used  . Alcohol use No  . Drug use: No  . Sexual activity: Not on file   Other Topics Concern  . Not on file   Social History Narrative  . No narrative on file    Past Surgical History:  Procedure Laterality Date  . CORONARY ANGIOPLASTY WITH STENT PLACEMENT    . CORONARY ARTERY BYPASS GRAFT  2003    Family History  Problem Relation Age of Onset  . Heart disease Mother   . Heart disease Father   . Diabetes Sister   . Diabetes Brother     No Active Allergies  Current Outpatient Prescriptions on File Prior to Visit  Medication Sig Dispense Refill  . ACCU-CHEK SOFTCLIX LANCETS lancets Check blood  sugars three times a day. 100 each 12  . Alcohol Swabs (B-D SINGLE USE SWABS REGULAR) PADS Use as necessary 300 each 6  . allopurinol (ZYLOPRIM) 300 MG tablet take 1 tablet by mouth once daily 90 tablet 1  . amLODipine (NORVASC) 10 MG tablet Take 1 tablet (10 mg total) by mouth daily. 90 tablet 1  . aspirin 81 MG tablet Take 81 mg by mouth daily.    Marland Kitchen atorvastatin (LIPITOR) 40 MG tablet Take 1 tablet (40 mg total) by mouth daily. 90 tablet 1  . clotrimazole-betamethasone (LOTRISONE) cream     . colchicine (COLCRYS) 0.6 MG tablet take 1 tablet by mouth once daily 90 tablet 3  . EASY COMFORT PEN NEEDLES 31G X 5 MM MISC use as directed THREE TIMES DAILY with lantus and novolog doses 100 each 3  . furosemide (LASIX) 20 MG tablet TAKE 1 TABLET BY MOUTH DAILY 90 tablet 1  . glucose blood (ACCU-CHEK AVIVA PLUS) test strip Use as instructed to check blood sugar 2 times per day dx code E11.9 100 each 3  . HYDROcodone-homatropine (HYCODAN) 5-1.5 MG/5ML syrup Take 5 mLs by mouth every 6 (six) hours as needed for cough. 120 mL 0  . insulin aspart (NOVOLOG FLEXPEN) 100 UNIT/ML FlexPen Inject 20 units into skin prior to breakfast and 20  units prior to evening meal 15 mL 1  . insulin degludec (TRESIBA FLEXTOUCH) 100 UNIT/ML SOPN FlexTouch Pen Inject 0.34 mLs (34 Units total) into the skin daily. 15 mL 2  . metFORMIN (GLUCOPHAGE) 1000 MG tablet TAKE ONE TABLET BY MOUTH TWICE DAILY. TAKE WITH A MEAL. 180 tablet 1  . metoprolol (LOPRESSOR) 100 MG tablet Take 1 tablet (100 mg total) by mouth 2 (two) times daily. 180 tablet 3  . nystatin-triamcinolone ointment (MYCOLOG) Apply topically 2 (two) times daily. 30 g 2  . ramipril (ALTACE) 10 MG capsule TAKE ONE CAPSULE BY MOUTH ONCE DAILY. 90 capsule 1  . sildenafil (REVATIO) 20 MG tablet Take 2 tablets (40 mg total) by mouth 3 (three) times daily. 50 tablet 0  . vardenafil (LEVITRA) 20 MG tablet Take 20 mg by mouth daily as needed.     No current facility-administered  medications on file prior to visit.     BP 140/80 (BP Location: Left Arm, Patient Position: Sitting, Cuff Size: Normal)   Pulse 68   Temp 98.4 F (36.9 C) (Oral)   Ht 6\' 1"  (1.854 m)   Wt 223 lb (101.2 kg)   SpO2 98%   BMI 29.42 kg/m     Review of Systems  Constitutional: Negative for appetite change, chills, fatigue and fever.  HENT: Negative for congestion, dental problem, ear pain, hearing loss, sore throat, tinnitus, trouble swallowing and voice change.   Eyes: Negative for pain, discharge and visual disturbance.  Respiratory: Negative for cough, chest tightness, wheezing and stridor.   Cardiovascular: Negative for chest pain, palpitations and leg swelling.  Gastrointestinal: Negative for abdominal distention, abdominal pain, blood in stool, constipation, diarrhea, nausea and vomiting.  Genitourinary: Negative for difficulty urinating, discharge, flank pain, genital sores, hematuria and urgency.  Musculoskeletal: Negative for arthralgias, back pain, gait problem, joint swelling, myalgias and neck stiffness.  Skin: Negative for rash.  Neurological: Negative for dizziness, syncope, speech difficulty, weakness, numbness and headaches.  Hematological: Negative for adenopathy. Does not bruise/bleed easily.  Psychiatric/Behavioral: Negative for behavioral problems and dysphoric mood. The patient is not nervous/anxious.        Objective:   Physical Exam  Constitutional: He is oriented to person, place, and time. He appears well-developed.  Blood pressure 132/82  HENT:  Head: Normocephalic.  Right Ear: External ear normal.  Left Ear: External ear normal.  Eyes: Conjunctivae and EOM are normal.  Neck: Normal range of motion.  Cardiovascular: Normal rate and normal heart sounds.   Pulmonary/Chest: Breath sounds normal.  Abdominal: Bowel sounds are normal.  Musculoskeletal: Normal range of motion. He exhibits no edema or tenderness.  Neurological: He is alert and oriented to  person, place, and time.  Psychiatric: He has a normal mood and affect. His behavior is normal.          Assessment & Plan:   Diabetes mellitus.  Poor control.  Follow-up endocrinology in 2 days as scheduled.  Will need to intensify mealtime insulin Hypertension, stable Dyslipidemia.  Continue statin therapy History of gout.  The stable Coronary artery disease.  Remains asymptomatic  CPX 6 months  [id number],PETER Pilar Plate

## 2016-07-01 ENCOUNTER — Ambulatory Visit (INDEPENDENT_AMBULATORY_CARE_PROVIDER_SITE_OTHER): Payer: Medicare Other | Admitting: Endocrinology

## 2016-07-01 ENCOUNTER — Encounter: Payer: Self-pay | Admitting: Endocrinology

## 2016-07-01 VITALS — BP 144/62 | HR 68 | Temp 98.0°F | Resp 16 | Ht 70.5 in | Wt 221.2 lb

## 2016-07-01 DIAGNOSIS — E1165 Type 2 diabetes mellitus with hyperglycemia: Secondary | ICD-10-CM

## 2016-07-01 DIAGNOSIS — Z794 Long term (current) use of insulin: Secondary | ICD-10-CM | POA: Diagnosis not present

## 2016-07-01 NOTE — Patient Instructions (Addendum)
  Check blood sugars on waking up 3-4 x weekly   Also check blood sugars about 2 hours after a meal and do this after different meals by rotation  Recommended blood sugar levels on waking up is 90-130 and about 2 hours after meal is 130-160  Please bring your blood sugar monitor to each visit, thank you  Must take [place], take pen with you when out  Tresiba 37 units daily  Stop sweet tea and sodas

## 2016-07-01 NOTE — Progress Notes (Signed)
Patient ID: Patrick Wilcox, male   DOB: 15-Aug-1945, 70 y.o.   MRN: [id number]    Reason for Appointment: Followup for Type 2 Diabetes  Referring physician: Burnice Logan  History of Present Illness:          Diagnosis: Type 2 diabetes mellitus, date of diagnosis: 2004       Past history:  At diagnosis he was having symptoms of drowsiness, difficulty breathing and blood sugar was reportedly over 600 He was initially treated with insulin but subsequently switched back to oral drugs He may have taken metformin and other medications for some time before going back on insulin His records indicate that he had been taking Amaryl and Januvia before going back on insulin in mid-2014 when his A1c had gone up over 13 Initially was given premixed insulin and then switched to Lantus and Humalog Record of his A1c indicates that his levels previously had been over 8% since 02/2012 He  had much better blood sugar control on his last visit with his changing his lifestyle and being instructed by the nurse educator  His Lantus did not appear to have 24 control with once a day injection  Recent history:   INSULIN regimen is described as:  34 units Tresiba daily hs, Novolog 20 ac breakfast and 20 supper    A1c is now back up to 8.1, previously had gone down to 7.5  Glucose monitoring, current management and problems identified:  His blood sugars are being checked very sporadically recently  Overall his blood sugars are higher and particularly in the morning  He has been fairly noncompliant with taking his evening NovoLog insulin before eating as he is frequently eating out  Also not watching his diet because of eating out and getting more sweets.  Also does not check his sugars after evening meals  Has been irregular with exercise also  He has no difficulty taking [name] and usually tries to take this daily   DIET: Has seen the dietitian in 2014 and nurse educator in 8/16       Meals: 2 meals per day 10 am and 6-7 pm. Breakfast is cereal +eggs;  toast, eggs, has fruit for snacks.  Evening meal usually vegetables, tuna salad, chicken.         Oral hypoglycemic drugs the patient is taking are: Metformin 1 g       Compliance with the medical regimen:  Fair Hypoglycemia: None    Glucose monitoring: Checking 1 times a day        Glucometer:  Accu-Chek    Blood Glucose readings  from  Download  Mean values apply above for all meters except median for One Touch  PRE-MEAL Fasting Lunch Dinner Bedtime Overall  Glucose range: 157-230  179, 142  108     Mean/median: 191    177    POST-MEAL PC Breakfast PC Lunch PC Dinner  Glucose range:  154, 242    Mean/median:         Exercise:  walking a little more recently  Weight history:  Wt Readings from Last 3 Encounters:  07/01/16 221 lb 4 oz (100.4 kg)  06/29/16 223 lb (101.2 kg)  03/31/16 229 lb (103.9 kg)    Glycemic control:   Lab Results  Component Value Date   HGBA1C 8.1 (H) 06/28/2016   HGBA1C 7.5 (H) 03/30/2016   HGBA1C 7.8 (H) 11/12/2015   Lab Results  Component Value Date   MICROALBUR 33.7 (H) 03/30/2016  [place] 57 03/30/2016   CREATININE 0.87 06/28/2016    Other active problems: See review of systems  Lab on 06/28/2016  Component Date Value Ref Range Status  . Hgb A1c MFr Bld 06/28/2016 8.1* 4.6 - 6.5 % Final  . Sodium 06/28/2016 140  135 - 145 mEq/L Final  . Potassium 06/28/2016 4.5  3.5 - 5.1 mEq/L Final  . Chloride 06/28/2016 104  96 - 112 mEq/L Final  . CO2 06/28/2016 29  19 - 32 mEq/L Final  . Glucose, Bld 06/28/2016 229* 70 - 99 mg/dL Final  . BUN 06/28/2016 12  6 - 23 mg/dL Final  . Creatinine, Ser 06/28/2016 0.87  0.40 - 1.50 mg/dL Final  . Calcium 06/28/2016 9.8  8.4 - 10.5 mg/dL Final  . GFR 06/28/2016 111.34  >60.00 mL/min Final      Allergies as of 07/01/2016   No Known Allergies     Medication List       Accurate as of 07/01/16 11:59 PM. Always use your most  recent med list.          allopurinol 300 MG tablet Commonly known as:  ZYLOPRIM take 1 tablet by mouth once daily   amLODipine 10 MG tablet Commonly known as:  NORVASC Take 1 tablet (10 mg total) by mouth daily.   aspirin 81 MG tablet Take 81 mg by mouth daily.   atorvastatin 40 MG tablet Commonly known as:  LIPITOR Take 1 tablet (40 mg total) by mouth daily.   B-D SINGLE USE SWABS REGULAR Pads Use as necessary   clotrimazole-betamethasone cream Commonly known as:  LOTRISONE   colchicine 0.6 MG tablet Commonly known as:  COLCRYS take 1 tablet by mouth once daily   EASY COMFORT PEN NEEDLES 31G X 5 MM Misc Generic drug:  Insulin Pen Needle use as directed THREE TIMES DAILY with lantus and novolog doses   furosemide 20 MG tablet Commonly known as:  LASIX TAKE 1 TABLET BY MOUTH DAILY   glucose blood test strip Commonly known as:  ACCU-CHEK AVIVA PLUS Use as instructed to check blood sugar 2 times per day dx code E11.9   insulin aspart 100 UNIT/ML FlexPen Commonly known as:  NOVOLOG FLEXPEN Inject 20 units into skin prior to breakfast and 20 units prior to evening meal   insulin degludec 100 UNIT/ML Sopn FlexTouch Pen Commonly known as:  TRESIBA FLEXTOUCH Inject 0.34 mLs (34 Units total) into the skin daily.   metFORMIN 1000 MG tablet Commonly known as:  GLUCOPHAGE TAKE ONE TABLET BY MOUTH TWICE DAILY. TAKE WITH A MEAL.   metoprolol 100 MG tablet Commonly known as:  LOPRESSOR Take 1 tablet (100 mg total) by mouth 2 (two) times daily.   nystatin-triamcinolone ointment Commonly known as:  MYCOLOG Apply topically 2 (two) times daily.   ramipril 10 MG capsule Commonly known as:  ALTACE TAKE ONE CAPSULE BY MOUTH ONCE DAILY.   vardenafil 20 MG tablet Commonly known as:  LEVITRA Take 20 mg by mouth daily as needed.       Allergies:  No Known Allergies  Past Medical History:  Diagnosis Date  . CORONARY ARTERY DISEASE 11/23/2006  . DIABETES MELLITUS,  TYPE II 11/23/2006  . GOUT 05/20/2008  . HYPERLIPIDEMIA 11/23/2006  . HYPERTENSION 11/23/2006  . 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 PNEUMONIA, LEFT LOWER LOBE 09/08/2009    Past Surgical History:  Procedure Laterality Date  . CORONARY ANGIOPLASTY WITH STENT PLACEMENT    . CORONARY ARTERY BYPASS GRAFT  2003    Family History  Problem Relation Age of Onset  . Heart disease Mother   . Heart disease Father   . Diabetes Sister   . Diabetes Brother     Social History:  reports that he has never smoked. He has never used smokeless tobacco. He reports that he does not drink alcohol or use drugs.    Review of Systems        Lipids:  LDL Adequately treated with Lipitor, this was started after his MI, HDL stays low       Lab Results  Component Value Date   CHOL 104 03/30/2016   HDL 32.60 (L) 03/30/2016   LDLCALC 57 03/30/2016   TRIG 70.0 03/30/2016   CHOLHDL 3 03/30/2016       The blood pressure has been treated with Norvasc10 mg, ramipril and metoprolol Renal function normal  Complications: No evidence of neuropathy or nephropathy.   Diabetic foot exam was done in2/2017     LABS:  Lab on 06/28/2016  Component Date Value Ref Range Status  . Hgb A1c MFr Bld 06/28/2016 8.1* 4.6 - 6.5 % Final  . Sodium 06/28/2016 140  135 - 145 mEq/L Final  . Potassium 06/28/2016 4.5  3.5 - 5.1 mEq/L Final  . Chloride 06/28/2016 104  96 - 112 mEq/L Final  . CO2 06/28/2016 29  19 - 32 mEq/L Final  . Glucose, Bld 06/28/2016 229* 70 - 99 mg/dL Final  . BUN 06/28/2016 12  6 - 23 mg/dL Final  . Creatinine, Ser 06/28/2016 0.87  0.40 - 1.50 mg/dL Final  . Calcium 06/28/2016 9.8  8.4 - 10.5 mg/dL Final  . GFR 06/28/2016 111.34  >60.00 mL/min Final    Physical Examination:  BP (!) 144/62   Pulse 68   Temp 98 F (36.7 C) (Oral)   Resp 16   Ht 5' 10.5" (1.791 m)   Wt 221 lb 4 oz (100.4 kg)   BMI 31.30 kg/m         ASSESSMENT/PLAN  Diabetes type 2, On insulin   See history of  present illness for detailed discussion of current management, problems identified and blood sugar patterns.  His A1c is higher at 8.1 but is recently blood sugars are averaging about 190 fasting Overall has difficulty keeping up with his insulin in the evening and not following diet, this is likely to be from his eating out more and not being motivated to do the right things as before Blood sugar monitoring has been sporadic also and none after supper  Some of this hyperglycemia likely related to not taking mealtime coverage before eating in the evening and also making poor choices when eating out. However most likely he needs increased basal insulin because of persistently high fasting readings Does not appear to have high readings at suppertime but checking infrequently and not clear if he needs coverage for lunch also Recommendations: See below Detailed discussion done today on day-to-day management including need for taking insulin with him when he is eating out, does not need to keep his insulin refrigerated Discussed fasting blood sugar will be the diet to adjusting his to see vitals  Counseling time on subjects discussed above is over 50% of today's 25 minute visit     Patient Instructions   Check blood sugars on waking up 3-4 x weekly   Also check blood sugars about 2 hours after a meal and do this after different meals by rotation  Recommended blood sugar  levels on waking up is 90-130 and about 2 hours after meal is 130-160  Please bring your blood sugar monitor to each visit, thank you  Must take [place], take pen with you when out  Tresiba 37 units daily  Stop sweet tea and sodas            Franceska Strahm 07/02/2016, 10:47 AM   Note: This office note was prepared with Estate agent. Any transcriptional errors that result from this process are unintentional.

## 2016-07-06 ENCOUNTER — Ambulatory Visit: Payer: Medicare Other | Admitting: Internal Medicine

## 2016-07-14 ENCOUNTER — Other Ambulatory Visit: Payer: Self-pay | Admitting: Internal Medicine

## 2016-07-21 DIAGNOSIS — H2513 Age-related nuclear cataract, bilateral: Secondary | ICD-10-CM | POA: Diagnosis not present

## 2016-07-21 DIAGNOSIS — H25013 Cortical age-related cataract, bilateral: Secondary | ICD-10-CM | POA: Diagnosis not present

## 2016-07-21 DIAGNOSIS — H40013 Open angle with borderline findings, low risk, bilateral: Secondary | ICD-10-CM | POA: Diagnosis not present

## 2016-08-12 ENCOUNTER — Other Ambulatory Visit: Payer: Medicare Other

## 2016-08-17 ENCOUNTER — Ambulatory Visit: Payer: Medicare Other | Admitting: Endocrinology

## 2016-08-22 ENCOUNTER — Other Ambulatory Visit: Payer: Self-pay

## 2016-08-22 MED ORDER — INSULIN ASPART 100 UNIT/ML FLEXPEN: PEN_INJECTOR | SUBCUTANEOUS | 1 refills | Status: DC

## 2016-09-14 ENCOUNTER — Encounter: Payer: Self-pay | Admitting: Endocrinology

## 2016-09-14 ENCOUNTER — Ambulatory Visit (INDEPENDENT_AMBULATORY_CARE_PROVIDER_SITE_OTHER): Payer: PPO | Admitting: Endocrinology

## 2016-09-14 VITALS — BP 138/60 | HR 65 | Ht 71.0 in | Wt 222.0 lb

## 2016-09-14 DIAGNOSIS — E1165 Type 2 diabetes mellitus with hyperglycemia: Secondary | ICD-10-CM

## 2016-09-14 DIAGNOSIS — I10 Essential (primary) hypertension: Secondary | ICD-10-CM | POA: Diagnosis not present

## 2016-09-14 DIAGNOSIS — Z794 Long term (current) use of insulin: Secondary | ICD-10-CM | POA: Diagnosis not present

## 2016-09-14 NOTE — Progress Notes (Signed)
Patient ID: Patrick Wilcox, male   DOB: 28-Jul-1945, 71 y.o.   MRN: [id number]    Reason for Appointment: Followup for Type 2 Diabetes  Referring physician: Burnice Logan  History of Present Illness:          Diagnosis: Type 2 diabetes mellitus, date of diagnosis: 2004       Past history:  At diagnosis he was having symptoms of drowsiness, difficulty breathing and blood sugar was reportedly over 600 He was initially treated with insulin but subsequently switched back to oral drugs He may have taken metformin and other medications for some time before going back on insulin His records indicate that he had been taking Amaryl and Januvia before going back on insulin in mid-2014 when his A1c had gone up over 13 Initially was given premixed insulin and then switched to Lantus and Humalog Record of his A1c indicates that his levels previously had been over 8% since 02/2012 He  had much better blood sugar control on his last visit with his changing his lifestyle and being instructed by the nurse educator  His Lantus did not appear to have 24 control with once a day injection  Recent history:   INSULIN regimen is described as:  34 units Tresiba daily hs, Novolog 20 ac breakfast and 20 supper   A1c is now back up to 8.1, previously had gone down to 7.5  Glucose monitoring, current management and problems identified:  His blood sugars are being checked  sporadically and not clear which readings are after meals  Overall his blood sugars are somewhat better, has only 3 readings before noon on his monitor and presumably these are before his first meal.  These are relatively better, they are averaging 191 on his last visit  This is despite his forgetting to increase his TRESIBA by 3 units as directed on the last visit  He thinks he is not eating out as much and still not trying to take his insulin and with him when he is going out to eat such as on Sundays  He was also reminded to take  NovoLog right before eating and not after  He thinks his sugars are higher when he is eating sweets sometimes  However he thinks he is cutting back on his sweet drinks compared to last time  However has not exercised much with the weather being cold and wet has not come down   DIET: Has seen the dietitian in 2014 and nurse educator in 8/16       Meals: 2 meals per day 10 am and 6-7 pm.  Breakfast is cereal +eggs;  toast, eggs, has fruit for snacks.  Evening meal usually vegetables, tuna salad, chicken.         Oral hypoglycemic drugs the patient is taking are: Metformin 1 g twice a day       Compliance with the medical regimen:  Fair Hypoglycemia: None    Glucose monitoring: Checking less than 1 times a day        Glucometer:  Accu-Chek    Blood Glucose readings  from  Download  Mean values apply above for all meters except median for One Touch  PRE-MEAL Fasting Lunch Dinner Bedtime Overall  Glucose range: 154-196       Mean/median:     16 0   POST-MEAL PC Breakfast PC Lunch PC Dinner  Glucose range:  131-176  127-200   Mean/median:          Exercise:  walking a little recently  Weight history:  Wt Readings from Last 3 Encounters:  09/14/16 222 lb (100.7 kg)  07/01/16 221 lb 4 oz (100.4 kg)  06/29/16 223 lb (101.2 kg)    Glycemic control:   Lab Results  Component Value Date   HGBA1C 8.1 (H) 06/28/2016   HGBA1C 7.5 (H) 03/30/2016   HGBA1C 7.8 (H) 11/12/2015   Lab Results  Component Value Date   MICROALBUR 33.7 (H) 03/30/2016   LDLCALC 57 03/30/2016   CREATININE 0.87 06/28/2016    Other active problems: See review of systems  No visits with results within 1 Week(s) from this visit.  Latest known visit with results is:  Lab on 06/28/2016  Component Date Value Ref Range Status  . Hgb A1c MFr Bld 06/28/2016 8.1* 4.6 - 6.5 % Final  . Sodium 06/28/2016 140  135 - 145 mEq/L Final  . Potassium 06/28/2016 4.5  3.5 - 5.1 mEq/L Final  . Chloride 06/28/2016 104   96 - 112 mEq/L Final  . CO2 06/28/2016 29  19 - 32 mEq/L Final  . Glucose, Bld 06/28/2016 229* 70 - 99 mg/dL Final  . BUN 06/28/2016 12  6 - 23 mg/dL Final  . Creatinine, Ser 06/28/2016 0.87  0.40 - 1.50 mg/dL Final  . Calcium 06/28/2016 9.8  8.4 - 10.5 mg/dL Final  . GFR 06/28/2016 111.34  >60.00 mL/min Final      Allergies as of 09/14/2016   No Known Allergies     Medication List       Accurate as of 09/14/16 10:41 AM. Always use your most recent med list.          allopurinol 300 MG tablet Commonly known as:  ZYLOPRIM take 1 tablet by mouth once daily   amLODipine 10 MG tablet Commonly known as:  NORVASC Take 1 tablet (10 mg total) by mouth daily.   aspirin 81 MG tablet Take 81 mg by mouth daily.   atorvastatin 40 MG tablet Commonly known as:  LIPITOR Take 1 tablet (40 mg total) by mouth daily.   B-D SINGLE USE SWABS REGULAR Pads Use as necessary   clotrimazole-betamethasone cream Commonly known as:  LOTRISONE   colchicine 0.6 MG tablet Commonly known as:  COLCRYS take 1 tablet by mouth once daily   EASY COMFORT PEN NEEDLES 31G X 5 MM Misc Generic drug:  Insulin Pen Needle use as directed THREE TIMES DAILY with lantus and novolog doses   furosemide 20 MG tablet Commonly known as:  LASIX TAKE 1 TABLET BY MOUTH DAILY   glucose blood test strip Commonly known as:  ACCU-CHEK AVIVA PLUS Use as instructed to check blood sugar 2 times per day dx code E11.9   insulin aspart 100 UNIT/ML FlexPen Commonly known as:  NOVOLOG FLEXPEN Inject 20 units into skin prior to breakfast and 20 units prior to evening meal   insulin degludec 100 UNIT/ML Sopn FlexTouch Pen Commonly known as:  TRESIBA FLEXTOUCH Inject 0.34 mLs (34 Units total) into the skin daily.   metFORMIN 1000 MG tablet Commonly known as:  GLUCOPHAGE TAKE ONE TABLET BY MOUTH TWICE DAILY. TAKE WITH A MEAL.   metoprolol 100 MG tablet Commonly known as:  LOPRESSOR Take 1 tablet (100 mg total) by mouth  2 (two) times daily.   nystatin-triamcinolone ointment Commonly known as:  MYCOLOG Apply topically 2 (two) times daily.   ramipril 10 MG capsule Commonly known as:  ALTACE TAKE ONE CAPSULE BY MOUTH ONCE DAILY.  vardenafil 20 MG tablet Commonly known as:  LEVITRA Take 20 mg by mouth daily as needed.       Allergies:  No Known Allergies  Past Medical History:  Diagnosis Date  . CORONARY ARTERY DISEASE 11/23/2006  . DIABETES MELLITUS, TYPE II 11/23/2006  . GOUT 05/20/2008  . HYPERLIPIDEMIA 11/23/2006  . HYPERTENSION 11/23/2006  . 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 PNEUMONIA, LEFT LOWER LOBE 09/08/2009    Past Surgical History:  Procedure Laterality Date  . CORONARY ANGIOPLASTY WITH STENT PLACEMENT    . CORONARY ARTERY BYPASS GRAFT  2003    Family History  Problem Relation Age of Onset  . Heart disease Mother   . Heart disease Father   . Diabetes Sister   . Diabetes Brother     Social History:  reports that he has never smoked. He has never used smokeless tobacco. He reports that he does not drink alcohol or use drugs.    Review of Systems        Lipids:  LDL Adequately treated with Lipitor40 mg, this was started after his MI, HDL  low       Lab Results  Component Value Date   CHOL 104 03/30/2016   HDL 32.60 (L) 03/30/2016   LDLCALC 57 03/30/2016   TRIG 70.0 03/30/2016   CHOLHDL 3 03/30/2016       The blood pressure has been treated with Norvasc10 mg, ramipril and metoprolol  Complications: No evidence of neuropathy or nephropathy .   He has some discomfort sleep but no sharp pains  Diabetic foot exam was done in 3/18  No symptoms of claudication on walking     LABS:  No visits with results within 1 Week(s) from this visit.  Latest known visit with results is:  Lab on 06/28/2016  Component Date Value Ref Range Status  . Hgb A1c MFr Bld 06/28/2016 8.1* 4.6 - 6.5 % Final  . Sodium 06/28/2016 140  135 - 145 mEq/L Final  . Potassium  06/28/2016 4.5  3.5 - 5.1 mEq/L Final  . Chloride 06/28/2016 104  96 - 112 mEq/L Final  . CO2 06/28/2016 29  19 - 32 mEq/L Final  . Glucose, Bld 06/28/2016 229* 70 - 99 mg/dL Final  . BUN 06/28/2016 12  6 - 23 mg/dL Final  . Creatinine, Ser 06/28/2016 0.87  0.40 - 1.50 mg/dL Final  . Calcium 06/28/2016 9.8  8.4 - 10.5 mg/dL Final  . GFR 06/28/2016 111.34  >60.00 mL/min Final    Physical Examination:  BP 138/60   Pulse 65   Ht 5\' 11"  (1.803 m)   Wt 222 lb (100.7 kg)   SpO2 95%   BMI 30.96 kg/m      Diabetic Foot Exam - Simple   Simple Foot Form Diabetic Foot exam was performed with the following findings:  Yes 09/14/2016 10:16 AM  Visual Inspection No deformities, no ulcerations, no other skin breakdown bilaterally:  Yes Sensation Testing Intact to touch and monofilament testing bilaterally:  Yes Pulse Check Posterior Tibialis and Dorsalis pulse intact bilaterally:  Yes Comments Absent left dorsalis pedis         ASSESSMENT/PLAN  Diabetes type 2, On insulin   See history of present illness for detailed discussion of current management, problems identified and blood sugar patterns.  Although his blood sugars are somewhat better overall including fasting compared to his last visit he has not followed instructions for his increase basal insulin He also has not checked  his blood sugars much and difficult to know what his blood sugar patterns are His average blood sugar is relatively better also No recent labs available to assess his overall control He may be a good candidate for the V-go pump  Recommendations: Increase his Tresiba to 37 units No change in 20 units of Novolog before his 2 meals Discussed fasting blood sugar targets Again discussed the use of the V-go pump to help him with better compliance with insulin and overall better control including fasting but he wants to think about it.  Discussed how this works and how it would be effective in controlling his  sugars without injections, however still may not get enough bolus insulin with this Also reminded him to take his insulin with him when he is eating out If he is planning to eat that these are taken increase NovoLog by 4 units and also reduce the carbohydrate at that meal Encouraged him to walk more regularly also     Patient Instructions  Tresiba 37 units daily  This will bring am sugar down, target 120  Stop sweet tea and sodas   Must take [place], take pen with you when out       Counseling time on subjects discussed above is over 50% of today's 25 minute visit     Daziya Redmond 09/14/2016, 10:41 AM   Note: This office note was prepared with Estate agent. Any transcriptional errors that result from this process are unintentional.

## 2016-09-14 NOTE — Patient Instructions (Addendum)
Tresiba 37 units daily  This will bring am sugar down, target 120  Stop sweet tea and sodas   Must take [place], take pen with you when out

## 2016-10-03 IMAGING — NM NM MISC PROCEDURE
6 series · 36 of 36 positions shown · non-contrast
Comparison: none

[Series 1: wbr rest · 6.40mm/px · 6 of 64 frames shown]
[frame 6/64]
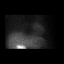
[frame 16/64]
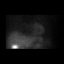
[frame 27/64]
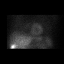
[frame 38/64]
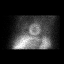
[frame 48/64]
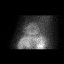
[frame 59/64]
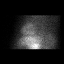

[Series 1: wbr_r-proj_st wbr rest · 6.40mm/px · 6 of 64 frames shown]
[frame 6/64]
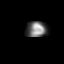
[frame 16/64]
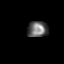
[frame 27/64]
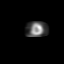
[frame 38/64]
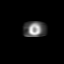
[frame 48/64]
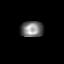
[frame 59/64]
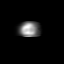

[Series 2: wbr_s-proj_st wbr stress-gsp · 6.40mm/px · 6 of 512 frames shown]
[frame 43/512]
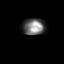
[frame 128/512]
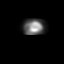
[frame 214/512]
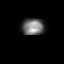
[frame 299/512]
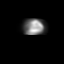
[frame 384/512]
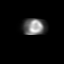
[frame 470/512]
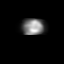

[Series 2: wbr stress-gsp · 6.40mm/px · 6 of 512 frames shown]
[frame 43/512]
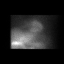
[frame 128/512]
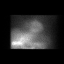
[frame 214/512]
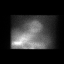
[frame 299/512]
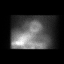
[frame 384/512]
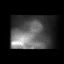
[frame 470/512]
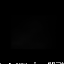

[Series 3: wbr_s-proj_st wbr stress-sum-em · 6.40mm/px · 6 of 64 frames shown]
[frame 6/64]
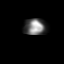
[frame 16/64]
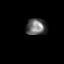
[frame 27/64]
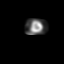
[frame 38/64]
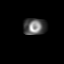
[frame 48/64]
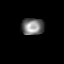
[frame 59/64]
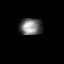

[Series 3: wbr stress-sum-em · 6.40mm/px · 6 of 64 frames shown]
[frame 6/64]
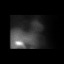
[frame 16/64]
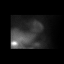
[frame 27/64]
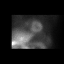
[frame 38/64]
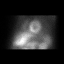
[frame 48/64]
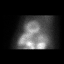
[frame 59/64]
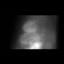

[36 of 36 positions shown; findings below may reference images not displayed]

Canned report from images found in remote index.

Refer to host system for actual result text.

## 2016-10-10 ENCOUNTER — Other Ambulatory Visit: Payer: Self-pay

## 2016-10-10 ENCOUNTER — Other Ambulatory Visit: Payer: Self-pay | Admitting: Family Medicine

## 2016-10-20 ENCOUNTER — Telehealth: Payer: Self-pay | Admitting: Endocrinology

## 2016-10-20 NOTE — Telephone Encounter (Signed)
Patrick Wilcox called from [place] advised that patient needs a rx for the meter he has since the insurance only covers the one touch ultra now instead of his previous rx.  Please call pharmacy at [phone] for any questions. Fax rx to: [phone]

## 2016-10-24 NOTE — Telephone Encounter (Signed)
He needs to find out if they would cover the Verio instead of the ultra

## 2016-10-24 NOTE — Telephone Encounter (Signed)
Left a vm requesting the patient call his insurance company and see if they will cover the verio

## 2016-10-26 ENCOUNTER — Other Ambulatory Visit: Payer: Self-pay

## 2016-10-26 MED ORDER — GLUCOSE BLOOD VI STRP: ORAL_STRIP | 3 refills | Status: DC

## 2016-11-01 NOTE — Telephone Encounter (Signed)
Spoke with the patient and he is going to call the insurance company to see what is covered- he stated he will call back when he knows which is covered

## 2016-11-08 ENCOUNTER — Other Ambulatory Visit: Payer: Self-pay

## 2016-11-08 MED ORDER — ONETOUCH VERIO SYNC SYSTEM W/DEVICE KIT: PACK | 0 refills | Status: DC

## 2016-11-08 MED ORDER — GLUCOSE BLOOD VI STRP: ORAL_STRIP | 1 refills | Status: DC

## 2016-11-08 MED ORDER — ONETOUCH DELICA LANCETS 33G MISC: 1 refills | Status: DC

## 2016-11-11 ENCOUNTER — Other Ambulatory Visit (INDEPENDENT_AMBULATORY_CARE_PROVIDER_SITE_OTHER): Payer: PPO

## 2016-11-11 DIAGNOSIS — E1165 Type 2 diabetes mellitus with hyperglycemia: Secondary | ICD-10-CM | POA: Diagnosis not present

## 2016-11-11 DIAGNOSIS — Z794 Long term (current) use of insulin: Secondary | ICD-10-CM

## 2016-11-11 LAB — BASIC METABOLIC PANEL
BUN: 8 mg/dL (ref 6–23)
CO2: 27 mEq/L (ref 19–32)
Calcium: 9.8 mg/dL (ref 8.4–10.5)
Chloride: 104 mEq/L (ref 96–112)
Creatinine, Ser: 0.75 mg/dL (ref 0.40–1.50)
GFR: 132 mL/min (ref >60.00)
Glucose, Bld: 162 mg/dL — ABNORMAL HIGH (ref 70–99)
Potassium: 3.6 mEq/L (ref 3.5–5.1)
Sodium: 139 mEq/L (ref 135–145)

## 2016-11-11 LAB — HEMOGLOBIN A1C: Hgb A1c MFr Bld: 8.2 % — ABNORMAL HIGH (ref 4.6–6.5)

## 2016-11-14 ENCOUNTER — Encounter: Payer: Self-pay | Admitting: Endocrinology

## 2016-11-14 ENCOUNTER — Ambulatory Visit: Payer: PPO | Admitting: Endocrinology

## 2016-11-14 ENCOUNTER — Ambulatory Visit (INDEPENDENT_AMBULATORY_CARE_PROVIDER_SITE_OTHER): Payer: PPO | Admitting: Endocrinology

## 2016-11-14 VITALS — BP 150/72 | HR 57 | Ht 71.0 in | Wt 222.6 lb

## 2016-11-14 DIAGNOSIS — E1165 Type 2 diabetes mellitus with hyperglycemia: Secondary | ICD-10-CM

## 2016-11-14 DIAGNOSIS — Z794 Long term (current) use of insulin: Secondary | ICD-10-CM

## 2016-11-14 DIAGNOSIS — I10 Essential (primary) hypertension: Secondary | ICD-10-CM

## 2016-11-14 NOTE — Patient Instructions (Addendum)
Tresiba 36 units daily and take it with supper  If am sugars staying < 90 in am reduce tresiba to 33 units  More sugars AFTER SUPPER  Walk daily  Check blood sugars on waking up  4x weekly  Also check blood sugars about 2 hours after a meal and do this after different meals by rotation  Recommended blood sugar levels on waking up is 90-130 and about 2 hours after meal is 130-160  Please bring your blood sugar monitor to each visit, thank you

## 2016-11-14 NOTE — Progress Notes (Signed)
Patient ID: Patrick Wilcox, male   DOB: 06/10/1946, 70 y.o.   MRN: [id number]    Reason for Appointment: Followup for Type 2 Diabetes  Referring physician: Burnice Logan  History of Present Illness:          Diagnosis: Type 2 diabetes mellitus, date of diagnosis: 2004       Past history:  At diagnosis he was having symptoms of drowsiness, difficulty breathing and blood sugar was reportedly over 600 He was initially treated with insulin but subsequently switched back to oral drugs He may have taken metformin and other medications for some time before going back on insulin His records indicate that he had been taking Amaryl and Januvia before going back on insulin in mid-2014 when his A1c had gone up over 13 Initially was given premixed insulin and then switched to Lantus and Humalog Record of his A1c indicates that his levels previously had been over 8% since 02/2012 He  had much better blood sugar control on his last visit with his changing his lifestyle and being instructed by the nurse educator  His Lantus did not appear to have 24 control with once a day injection  Recent history:   INSULIN regimen is described as:  27 units Tresiba daily hs, Novolog 20 ac breakfast and 20 supper   A1c is now back up to 8.2  Glucose monitoring, current management and problems identified:  He was told to take 37 units of Tresiba and given written instructions but he is taking only 27 units  Also now he says that he may forget to take this at bedtime sometimes as he falls asleep  His blood sugars are mostly high fasting and averaging nearly 200  Blood sugars are variable rest of the day and sometimes better before supper  Again not remembering to check her sugars after supper  However he is trying to do a little better with taking his NovoLog before eating instead of after eating  Still not able to lose weight   DIET: Has seen the dietitian in 2014 and nurse educator in 8/16         Meals: 2 meals per day 10 am and 6 pm.  Breakfast is cereal +eggs;  toast, eggs, has fruit for snacks.  Evening meal usually vegetables, tuna salad, chicken.         Oral hypoglycemic drugs the patient is taking are: Metformin 1 g twice a day       Compliance with the medical regimen:  Fair Hypoglycemia: None    Glucose monitoring: Checking less than 1 times a day        Glucometer:  Accu-Chek    Blood Glucose readings  from  Download  Mean values apply above for all meters except median for One Touch  PRE-MEAL Fasting Lunch Dinner Bedtime Overall  Glucose range: 158-235  163     Mean/median: 190    174        Exercise:  walking a little   Weight history:  Wt Readings from Last 3 Encounters:  11/14/16 222 lb 9.6 oz (101 kg)  09/14/16 222 lb (100.7 kg)  07/01/16 221 lb 4 oz (100.4 kg)    Glycemic control:   Lab Results  Component Value Date   HGBA1C 8.2 (H) 11/11/2016   HGBA1C 8.1 (H) 06/28/2016   HGBA1C 7.5 (H) 03/30/2016   Lab Results  Component Value Date   MICROALBUR 33.7 (H) 03/30/2016   LDLCALC 57 03/30/2016  CREATININE 0.75 11/11/2016    Other active problems: See review of systems   Lab on 11/11/2016  Component Date Value Ref Range Status  . Sodium 11/11/2016 139  135 - 145 mEq/L Final  . Potassium 11/11/2016 3.6  3.5 - 5.1 mEq/L Final  . Chloride 11/11/2016 104  96 - 112 mEq/L Final  . CO2 11/11/2016 27  19 - 32 mEq/L Final  . Glucose, Bld 11/11/2016 162* 70 - 99 mg/dL Final  . BUN 11/11/2016 8  6 - 23 mg/dL Final  . Creatinine, Ser 11/11/2016 0.75  0.40 - 1.50 mg/dL Final  . Calcium 11/11/2016 9.8  8.4 - 10.5 mg/dL Final  . GFR 11/11/2016 132.00  >60.00 mL/min Final  . Hgb A1c MFr Bld 11/11/2016 8.2* 4.6 - 6.5 % Final      Allergies as of 11/14/2016   No Known Allergies     Medication List       Accurate as of 11/14/16 10:48 AM. Always use your most recent med list.          allopurinol 300 MG tablet Commonly known as:   ZYLOPRIM take 1 tablet by mouth once daily   amLODipine 10 MG tablet Commonly known as:  NORVASC Take 1 tablet (10 mg total) by mouth daily.   aspirin 81 MG tablet Take 81 mg by mouth daily.   atorvastatin 40 MG tablet Commonly known as:  LIPITOR Take 1 tablet (40 mg total) by mouth daily.   B-D SINGLE USE SWABS REGULAR Pads Use as necessary   clotrimazole-betamethasone cream Commonly known as:  LOTRISONE   colchicine 0.6 MG tablet Commonly known as:  COLCRYS take 1 tablet by mouth once daily   EASY COMFORT PEN NEEDLES 31G X 5 MM Misc Generic drug:  Insulin Pen Needle use as directed THREE TIMES DAILY with lantus and novolog doses   furosemide 20 MG tablet Commonly known as:  LASIX TAKE 1 TABLET BY MOUTH DAILY   glucose blood test strip Commonly known as:  ONETOUCH VERIO Use as instructed   insulin aspart 100 UNIT/ML FlexPen Commonly known as:  NOVOLOG FLEXPEN Inject 20 units into skin prior to breakfast and 20 units prior to evening meal   insulin degludec 100 UNIT/ML Sopn FlexTouch Pen Commonly known as:  TRESIBA FLEXTOUCH Inject 0.34 mLs (34 Units total) into the skin daily.   metFORMIN 1000 MG tablet Commonly known as:  GLUCOPHAGE TAKE ONE TABLET BY MOUTH TWICE DAILY. TAKE WITH A MEAL.   metoprolol 100 MG tablet Commonly known as:  LOPRESSOR Take 1 tablet (100 mg total) by mouth 2 (two) times daily.   nystatin-triamcinolone ointment Commonly known as:  MYCOLOG Apply topically 2 (two) times daily.   ONETOUCH DELICA LANCETS 29U Misc Use to check blood sugar twice daily. DX Code E11.9   Cameron w/Device Kit Use to check blood sugar twice a day DX Code E11.9   ramipril 10 MG capsule Commonly known as:  ALTACE TAKE ONE CAPSULE BY MOUTH ONCE DAILY.   vardenafil 20 MG tablet Commonly known as:  LEVITRA Take 20 mg by mouth daily as needed.       Allergies:  No Known Allergies  Past Medical History:  Diagnosis Date  .  CORONARY ARTERY DISEASE 11/23/2006  . DIABETES MELLITUS, TYPE II 11/23/2006  . GOUT 05/20/2008  . HYPERLIPIDEMIA 11/23/2006  . HYPERTENSION 11/23/2006  . 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 PNEUMONIA, LEFT LOWER LOBE 09/08/2009    Past Surgical  History:  Procedure Laterality Date  . CORONARY ANGIOPLASTY WITH STENT PLACEMENT    . CORONARY ARTERY BYPASS GRAFT  2003    Family History  Problem Relation Age of Onset  . Heart disease Mother   . Heart disease Father   . Diabetes Sister   . Diabetes Brother     Social History:  reports that he has never smoked. He has never used smokeless tobacco. He reports that he does not drink alcohol or use drugs.    Review of Systems        Lipids:  LDL Adequately treated with Lipitor40 mg, this was started after his MI, HDL  low       Lab Results  Component Value Date   CHOL 104 03/30/2016   HDL 32.60 (L) 03/30/2016   LDLCALC 57 03/30/2016   TRIG 70.0 03/30/2016   CHOLHDL 3 03/30/2016       The blood pressure has been treated with Norvasc10 mg, ramipril and metoprolol  Better at drugstore  BP Readings from Last 3 Encounters:  11/14/16 (!) 150/90  09/14/16 138/60  07/01/16 (!) 767/20     Complications: No evidence of neuropathy or nephropathy  Diabetic foot exam was done in 3/18        LABS:  Lab on 11/11/2016  Component Date Value Ref Range Status  . Sodium 11/11/2016 139  135 - 145 mEq/L Final  . Potassium 11/11/2016 3.6  3.5 - 5.1 mEq/L Final  . Chloride 11/11/2016 104  96 - 112 mEq/L Final  . CO2 11/11/2016 27  19 - 32 mEq/L Final  . Glucose, Bld 11/11/2016 162* 70 - 99 mg/dL Final  . BUN 11/11/2016 8  6 - 23 mg/dL Final  . Creatinine, Ser 11/11/2016 0.75  0.40 - 1.50 mg/dL Final  . Calcium 11/11/2016 9.8  8.4 - 10.5 mg/dL Final  . GFR 11/11/2016 132.00  >60.00 mL/min Final  . Hgb A1c MFr Bld 11/11/2016 8.2* 4.6 - 6.5 % Final    Physical Examination:  BP (!) 150/90   Pulse (!) 57   Ht '5\' 11"'  (1.803 m)    Wt 222 lb 9.6 oz (101 kg)   SpO2 97%   BMI 31.05 kg/m        ASSESSMENT/PLAN  Diabetes type 2, On insulin   See history of present illness for detailed discussion of current management, problems identified and blood sugar patterns.  Blood sugars are not well-controlled He is not taking the prescribed dose of Tresiba and taking 10 units less than what he was told He does not understand the need for adjusting the dose based on fasting readings  He may be a good candidate for the V-go pump but is still hesitant to start today, and explained to him how this works  Recommendations: Increase his [name] to 36 units No change in 20 units of Novolog before his 2 meals Discussed fasting blood sugar targets He needs to start checking readings after his evening meal more consistently and this may also help with his compliance with diet  Encouraged him to walk more regularly also     Patient Instructions  Tresiba 36 units daily and take it with supper  If am sugars staying < 90 in am reduce tresiba to 33 units  More sugars AFTER SUPPER  Walk daily  Check blood sugars on waking up  4x weekly  Also check blood sugars about 2 hours after a meal and do this after different meals by rotation  Recommended blood sugar levels on waking up is 90-130 and about 2 hours after meal is 130-160  Please bring your blood sugar monitor to each visit, thank you         Counseling time on subjects discussed above is over 50% of today's 25 minute visit     Sircharles Holzheimer 11/14/2016, 10:48 AM   Note: This office note was prepared with Estate agent. Any transcriptional errors that result from this process are unintentional.

## 2016-12-09 ENCOUNTER — Other Ambulatory Visit: Payer: Self-pay | Admitting: Family Medicine

## 2016-12-12 ENCOUNTER — Other Ambulatory Visit: Payer: Self-pay

## 2016-12-12 ENCOUNTER — Other Ambulatory Visit: Payer: Self-pay | Admitting: Family Medicine

## 2016-12-12 ENCOUNTER — Telehealth: Payer: Self-pay | Admitting: Endocrinology

## 2016-12-12 MED ORDER — INSULIN DEGLUDEC 100 UNIT/ML ~~LOC~~ SOPN: PEN_INJECTOR | SUBCUTANEOUS | 2 refills | Status: DC

## 2016-12-12 MED ORDER — INSULIN ASPART 100 UNIT/ML FLEXPEN: PEN_INJECTOR | SUBCUTANEOUS | 1 refills | Status: DC

## 2016-12-12 NOTE — Telephone Encounter (Signed)
Called patient to let him know that his refills have been sent to his pharmacy.

## 2016-12-12 NOTE — Telephone Encounter (Signed)
**  Remind patient they can make refill requests via MyChart**  Medication refill request (Name & Dosage):  insulin degludec (TRESIBA FLEXTOUCH) 100 UNIT/ML SOPN FlexTouch Pen  insulin aspart (NOVOLOG FLEXPEN) 100 UNIT/ML FlexPen Preferred pharmacy (Name & Address):   [facility]- [place], [place] - [address] [phone] (Phone) [phone] (Fax)     Other comments (if applicable):

## 2016-12-14 ENCOUNTER — Other Ambulatory Visit: Payer: Self-pay | Admitting: Family Medicine

## 2016-12-20 ENCOUNTER — Other Ambulatory Visit: Payer: Self-pay | Admitting: Endocrinology

## 2016-12-21 ENCOUNTER — Other Ambulatory Visit: Payer: Self-pay

## 2016-12-21 MED ORDER — INSULIN DEGLUDEC 100 UNIT/ML ~~LOC~~ SOPN: PEN_INJECTOR | SUBCUTANEOUS | 2 refills | Status: DC

## 2016-12-22 ENCOUNTER — Other Ambulatory Visit: Payer: Self-pay

## 2016-12-22 MED ORDER — INSULIN DEGLUDEC 100 UNIT/ML ~~LOC~~ SOPN: PEN_INJECTOR | SUBCUTANEOUS | 2 refills | Status: DC

## 2016-12-26 ENCOUNTER — Other Ambulatory Visit: Payer: Self-pay | Admitting: Family Medicine

## 2016-12-28 ENCOUNTER — Encounter: Payer: Self-pay | Admitting: Internal Medicine

## 2016-12-28 ENCOUNTER — Telehealth: Payer: Self-pay

## 2016-12-28 ENCOUNTER — Ambulatory Visit (INDEPENDENT_AMBULATORY_CARE_PROVIDER_SITE_OTHER): Payer: PPO | Admitting: Internal Medicine

## 2016-12-28 VITALS — BP 148/82 | HR 70 | Temp 97.9°F | Ht 71.0 in | Wt 221.8 lb

## 2016-12-28 DIAGNOSIS — R011 Cardiac murmur, unspecified: Secondary | ICD-10-CM

## 2016-12-28 DIAGNOSIS — I2510 Atherosclerotic heart disease of native coronary artery without angina pectoris: Secondary | ICD-10-CM

## 2016-12-28 DIAGNOSIS — I10 Essential (primary) hypertension: Secondary | ICD-10-CM | POA: Diagnosis not present

## 2016-12-28 DIAGNOSIS — E785 Hyperlipidemia, unspecified: Secondary | ICD-10-CM

## 2016-12-28 DIAGNOSIS — E7800 Pure hypercholesterolemia, unspecified: Secondary | ICD-10-CM

## 2016-12-28 DIAGNOSIS — E1159 Type 2 diabetes mellitus with other circulatory complications: Secondary | ICD-10-CM

## 2016-12-28 DIAGNOSIS — Z0000 Encounter for general adult medical examination without abnormal findings: Secondary | ICD-10-CM | POA: Diagnosis not present

## 2016-12-28 DIAGNOSIS — I152 Hypertension secondary to endocrine disorders: Secondary | ICD-10-CM

## 2016-12-28 NOTE — Progress Notes (Signed)
Subjective:    Patient ID: Patrick Wilcox, male    DOB: 1945/11/26, 71 y.o.   MRN: [id number]  HPI  Lab Results  Component Value Date   HGBA1C 8.2 (H) 11/11/2016   71 year old patient who is seen today for a preventive health examination and subsequent Medicare wellness visit  He is followed by endocrinology for diabetes.  He presently is in the donut hole and occasionally runs out of medications.  He does have annual eye examinations every August. Colonoscopy performed 2010 Negative nuclear stress test March 2017  Past Medical History:  Diagnosis Date  . CORONARY ARTERY DISEASE 11/23/2006  . DIABETES MELLITUS, TYPE II 11/23/2006  . GOUT 05/20/2008  . HYPERLIPIDEMIA 11/23/2006  . HYPERTENSION 11/23/2006  . 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 PNEUMONIA, LEFT LOWER LOBE 09/08/2009     Social History   Social History  . Marital status: Single    Spouse name: N/A  . Number of children: N/A  . Years of education: N/A   Occupational History  . Not on file.   Social History Main Topics  . Smoking status: Never Smoker  . Smokeless tobacco: Never Used  . Alcohol use No  . Drug use: No  . Sexual activity: Not on file   Other Topics Concern  . Not on file   Social History Narrative  . No narrative on file    Past Surgical History:  Procedure Laterality Date  . CORONARY ANGIOPLASTY WITH STENT PLACEMENT    . CORONARY ARTERY BYPASS GRAFT  2003    Family History  Problem Relation Age of Onset  . Heart disease Mother   . Heart disease Father   . Diabetes Sister   . Diabetes Brother     No Known Allergies  Current Outpatient Prescriptions on File Prior to Visit  Medication Sig Dispense Refill  . Alcohol Swabs (B-D SINGLE USE SWABS REGULAR) PADS Use as necessary 300 each 6  . allopurinol (ZYLOPRIM) 300 MG tablet take 1 tablet by mouth once daily 90 tablet 1  . amLODipine (NORVASC) 10 MG tablet Take 1 tablet (10 mg total) by mouth daily. 90 tablet 1  . aspirin  81 MG tablet Take 81 mg by mouth daily.    Marland Kitchen atorvastatin (LIPITOR) 40 MG tablet Take 1 tablet (40 mg total) by mouth daily. 90 tablet 1  . Blood Glucose Monitoring Suppl (ONETOUCH VERIO IQ SYSTEM) w/Device KIT USE TO CHECK BLOOD SUGAR TWICE A DAY DX CODE E11.9 1 kit 2  . clotrimazole-betamethasone (LOTRISONE) cream     . colchicine (COLCRYS) 0.6 MG tablet take 1 tablet by mouth once daily 90 tablet 3  . Colchicine 0.6 MG CAPS Take 1 capsule by mouth daily 90 capsule 1  . EASY COMFORT PEN NEEDLES 31G X 5 MM MISC use as directed THREE TIMES DAILY with lantus and novolog doses 100 each 3  . furosemide (LASIX) 20 MG tablet TAKE 1 TABLET BY MOUTH DAILY 90 tablet 1  . glucose blood (ONETOUCH VERIO) test strip Use as instructed 200 each 1  . insulin aspart (NOVOLOG FLEXPEN) 100 UNIT/ML FlexPen Inject 20 units into skin prior to breakfast and 20 units prior to evening meal 15 mL 1  . insulin degludec (TRESIBA FLEXTOUCH) 100 UNIT/ML SOPN FlexTouch Pen 36 units daily with supper, If am sugars staying < 90 in am reduce tresiba to 33 units 15 mL 2  . metFORMIN (GLUCOPHAGE) 1000 MG tablet TAKE ONE TABLET BY MOUTH TWICE DAILY. TAKE  WITH A MEAL. 180 tablet 1  . metoprolol (LOPRESSOR) 100 MG tablet Take 1 tablet (100 mg total) by mouth 2 (two) times daily. 180 tablet 3  . nystatin-triamcinolone ointment (MYCOLOG) Apply topically 2 (two) times daily. 30 g 2  . ONETOUCH DELICA LANCETS 39Q MISC Use to check blood sugar twice daily. DX Code E11.9 200 each 1  . ramipril (ALTACE) 10 MG capsule TAKE ONE CAPSULE BY MOUTH ONCE DAILY. 90 capsule 1  . vardenafil (LEVITRA) 20 MG tablet Take 20 mg by mouth daily as needed.     No current facility-administered medications on file prior to visit.     BP (!) 148/82 (BP Location: Left Arm, Patient Position: Sitting, Cuff Size: Normal)   Pulse 70   Temp 97.9 F (36.6 C) (Oral)   Ht '5\' 11"'  (1.803 m)   Wt 221 lb 12.8 oz (100.6 kg)   SpO2 98%   BMI 30.93 kg/m   Medicare  wellness visit  1. Risk factors, based on past  M,S,F history.  Patient has known coronary artery disease.  Status post CABG.  Normal nuclear stress test March 2017.  Cardio vascular risk factors include hypertension and diabetes  2.  Physical activities:remains quite active.  No exercise limitations does walk 3 times per week  3.  Depression/mood:no major depression or mood disorder  4.  Hearing:no deficits  5.  ADL's:independent  6.  Fall risk:low  7.  Home safety:no problems identified  8.  Height weight, and visual acuity;height and weight stable no change in visual acuity is seen by ophthalmology every August  9.  Counseling:modest weight loss encouraged.  Continue heart healthy diet  10. Lab orders based on risk factors:recent laboratory studies reviewed  11. Referral :follow-up endocrinology.  Schedule 2-D echocardiogram to further evaluate systolic murmur  12. Care plan:continue efforts at aggressive risk factor modification  13. Cognitive assessment: alert in order with normal affect no cognitive dysfunction  14. Screening: Patient provided with a written and personalized 5-10 year screening schedule in the AVS.    15. Provider List Update: primary care endocrinology   Review of Systems  Constitutional: Negative for appetite change, chills, fatigue and fever.  HENT: Negative for congestion, dental problem, ear pain, hearing loss, sore throat, tinnitus, trouble swallowing and voice change.   Eyes: Negative for pain, discharge and visual disturbance.  Respiratory: Negative for cough, chest tightness, wheezing and stridor.   Cardiovascular: Negative for chest pain, palpitations and leg swelling.  Gastrointestinal: Negative for abdominal distention, abdominal pain, blood in stool, constipation, diarrhea, nausea and vomiting.  Genitourinary: Negative for difficulty urinating, discharge, flank pain, genital sores, hematuria and urgency.  Musculoskeletal: Negative for  arthralgias, back pain, gait problem, joint swelling, myalgias and neck stiffness.  Skin: Negative for rash.  Neurological: Negative for dizziness, syncope, speech difficulty, weakness, numbness and headaches.  Hematological: Negative for adenopathy. Does not bruise/bleed easily.  Psychiatric/Behavioral: Negative for behavioral problems and dysphoric mood. The patient is not nervous/anxious.        Objective:   Physical Exam  Constitutional: He appears well-developed and well-nourished.  Weight 221 Blood pressure 118/78  HENT:  Head: Normocephalic and atraumatic.  Right Ear: External ear normal.  Left Ear: External ear normal.  Nose: Nose normal.  Mouth/Throat: Oropharynx is clear and moist.  Eyes: Conjunctivae and EOM are normal. Pupils are equal, round, and reactive to light. No scleral icterus.  Neck: Normal range of motion. Neck supple. No JVD present. No thyromegaly present.  Cardiovascular:  Regular rhythm, normal heart sounds and intact distal pulses.  Exam reveals no gallop and no friction rub.   No murmur heard. Occasional ectopics Grade 3/6 systolic murmur loudest at the base Sternotomy scar  Right dorsalis pedis pulse intact.  Other pedal pulses not easily palpable  Pulmonary/Chest: Effort normal and breath sounds normal. He exhibits no tenderness.  Abdominal: Soft. Bowel sounds are normal. He exhibits no distension and no mass. There is no tenderness.  Genitourinary: Penis normal. Rectal exam shows guaiac negative stool.  Musculoskeletal: Normal range of motion. He exhibits no edema or tenderness.  Lymphadenopathy:    He has no cervical adenopathy.  Neurological: He is alert. He has normal reflexes. No cranial nerve deficit. Coordination normal.  Skin: Skin is warm and dry. No rash noted.  Scar left medial lower leg  Psychiatric: He has a normal mood and affect. His behavior is normal.          Assessment & Plan:   Preventive health examination Subsequent  Medicare wellness visit Essential hypertension, stable Diabetes mellitus.  Follow-up endocrinology Dyslipidemia.  Continue statin therapy Systolic heart murmur.  Will check 2-D echocardiogram to rule out hemodynamically significant aortic stenosis  Weight loss encouraged Follow-up endocrine Diabetic eye examination in August as scheduled  Follow-up here 6 months Schedule 2-D echo  Nyoka Cowden

## 2016-12-28 NOTE — Telephone Encounter (Signed)
Medication Samples have been provided to the patient.  Drug name: Humalog      Strength:         Qty: 1 BVA:[id number]  Exp.Date: 06/2018 Drug name: [name]    Strength:100 units      Qty: 2   LOT: [id number]             Exp Date: 01/2017  Dosing instructions: [name] 36 units  Daily with supper,if sugar is <90 in am reduce to 33 units.  The patient has been instructed regarding the correct time, dose, and frequency of taking this medication, including desired effects and most common side effects.   Andee Poles [place]o 10:13 AM 12/28/2016

## 2016-12-28 NOTE — Patient Instructions (Signed)
2-D echocardiogram as discussed  Limit your sodium (Salt) intake  Follow-up endocrinology   Please check your hemoglobin A1c every 3 months    It is important that you exercise regularly, at least 20 minutes 3 to 4 times per week.  If you develop chest pain or shortness of breath seek  medical attention.  Please check your blood pressure on a regular basis.  If it is consistently greater than 150/90, please make an office appointment.  Return in 6 months for follow-up

## 2017-01-16 ENCOUNTER — Other Ambulatory Visit (INDEPENDENT_AMBULATORY_CARE_PROVIDER_SITE_OTHER): Payer: PPO

## 2017-01-16 DIAGNOSIS — Z794 Long term (current) use of insulin: Secondary | ICD-10-CM | POA: Diagnosis not present

## 2017-01-16 DIAGNOSIS — E1165 Type 2 diabetes mellitus with hyperglycemia: Secondary | ICD-10-CM | POA: Diagnosis not present

## 2017-01-16 LAB — BASIC METABOLIC PANEL
BUN: 8 mg/dL (ref 6–23)
CO2: 28 mEq/L (ref 19–32)
Calcium: 9.7 mg/dL (ref 8.4–10.5)
Chloride: 100 mEq/L (ref 96–112)
Creatinine, Ser: 0.71 mg/dL (ref 0.40–1.50)
GFR: 140.55 mL/min (ref >60.00)
Glucose, Bld: 239 mg/dL — ABNORMAL HIGH (ref 70–99)
Potassium: 3.9 mEq/L (ref 3.5–5.1)
Sodium: 136 mEq/L (ref 135–145)

## 2017-01-17 LAB — FRUCTOSAMINE: Fructosamine: 362 umol/L — ABNORMAL HIGH (ref 0–285)

## 2017-01-24 ENCOUNTER — Encounter: Payer: Self-pay | Admitting: Endocrinology

## 2017-01-24 ENCOUNTER — Ambulatory Visit (INDEPENDENT_AMBULATORY_CARE_PROVIDER_SITE_OTHER): Payer: PPO | Admitting: Endocrinology

## 2017-01-24 VITALS — BP 142/90 | HR 68 | Ht 71.0 in | Wt 219.8 lb

## 2017-01-24 DIAGNOSIS — E1165 Type 2 diabetes mellitus with hyperglycemia: Secondary | ICD-10-CM | POA: Diagnosis not present

## 2017-01-24 DIAGNOSIS — E669 Obesity, unspecified: Secondary | ICD-10-CM

## 2017-01-24 DIAGNOSIS — Z794 Long term (current) use of insulin: Secondary | ICD-10-CM

## 2017-01-24 DIAGNOSIS — E1169 Type 2 diabetes mellitus with other specified complication: Secondary | ICD-10-CM

## 2017-01-24 NOTE — Patient Instructions (Signed)
Tresiba 48 units and adjust as directed every 3 days  Novolog 24 at supper when eating out  Cut back on sweets and sweet drinks  Check blood sugars on waking up  daily  Also check blood sugars about 2 hours after a meal and do this after different meals by rotation  Recommended blood sugar levels on waking up is 90-130 and about 2 hours after meal is 130-160  Please bring your blood sugar monitor to each visit, thank you

## 2017-01-24 NOTE — Progress Notes (Signed)
Patient ID: Patrick Wilcox, male   DOB: 01/11/1946, 71 y.o.   MRN: [id number]    Reason for Appointment: Followup for Type 2 Diabetes  Referring physician: Burnice Wilcox  History of Present Illness:          Diagnosis: Type 2 diabetes mellitus, date of diagnosis: 2004       Past history:  At diagnosis he was having symptoms of drowsiness, difficulty breathing and blood sugar was reportedly over 600 He was initially treated with insulin but subsequently switched back to oral drugs He may have taken metformin and other medications for some time before going back on insulin His records indicate that he had been taking Amaryl and Januvia before going back on insulin in mid-2014 when his A1c had gone up over 13 Initially was given premixed insulin and then switched to Lantus and Humalog Record of his A1c indicates that his levels previously had been over 8% since 02/2012 He  had much better blood sugar control on his last visit with his changing his lifestyle and being instructed by the nurse educator  His Lantus did not appear to have 24 control with once a day injection  Recent history:   INSULIN regimen is described as:  37 units Tresiba daily in p.m., Novolog 20 ac breakfast and 20 supper   A1c is now back up to 8.2, has been as low as 7.5  Glucose monitoring, current management and problems identified:  He has had much higher blood sugars for the last month or so and he does not know why  He did increase his [name] by 10 units on his last visit and is taking this regularly in the evening around suppertime also  His fasting readings are fairly consistently high with only one relatively good reading  He does not think he is using expired insulin and his [name] insulin is usually At home  He is still taking 20 units of NovoLog with his meals, usually taking 2 meals a day only  Occasionally if he is not at home he will not take his NovoLog at suppertime and take it  later  Has only 2 readings after supper which are also over 200  He thinks he is trying to walk a little bit more  However diet overall has been inadequate with eating desserts when he is eating out and drinking lemonade   DIET: Has seen the dietitian in 2014 and nurse educator in 8/16       Meals: 2 meals per day 10 am and 6 pm.   Breakfast is cereal +eggs;  toast, eggs, has fruit for snacks.  Evening meal usually vegetables, tuna salad, chicken.         Oral hypoglycemic drugs the patient is taking are: Metformin 1 g twice a day       Compliance with the medical regimen:  Fair Hypoglycemia: None    Glucose monitoring: Checking less than 1 times a day        Glucometer:  Accu-Chek    Blood Glucose readings  from  Download  Mean values apply above for all meters except median for One Touch  PRE-MEAL Fasting Lunch Dinner Bedtime Overall  Glucose range: 132-307       Mean/median: 215     219    POST-MEAL PC Breakfast PC Lunch PC Dinner  Glucose range:   223, 269   Mean/median:        Exercise:  walking 15+ min  Weight history:  Wt Readings from Last 3 Encounters:  01/24/17 219 lb 12.8 oz (99.7 kg)  12/28/16 221 lb 12.8 oz (100.6 kg)  11/14/16 222 lb 9.6 oz (101 kg)    Glycemic control:   Lab Results  Component Value Date   HGBA1C 8.2 (H) 11/11/2016   HGBA1C 8.1 (H) 06/28/2016   HGBA1C 7.5 (H) 03/30/2016   Lab Results  Component Value Date   MICROALBUR 33.7 (H) 03/30/2016   LDLCALC 57 03/30/2016   CREATININE 0.71 01/16/2017    Other active problems: See review of systems   No visits with results within 1 Week(s) from this visit.  Latest known visit with results is:  Lab on 01/16/2017  Component Date Value Ref Range Status  . Sodium 01/16/2017 136  135 - 145 mEq/L Final  . Potassium 01/16/2017 3.9  3.5 - 5.1 mEq/L Final  . Chloride 01/16/2017 100  96 - 112 mEq/L Final  . CO2 01/16/2017 28  19 - 32 mEq/L Final  . Glucose, Bld 01/16/2017 239* 70 - 99  mg/dL Final  . BUN 01/16/2017 8  6 - 23 mg/dL Final  . Creatinine, Ser 01/16/2017 0.71  0.40 - 1.50 mg/dL Final  . Calcium 01/16/2017 9.7  8.4 - 10.5 mg/dL Final  . GFR 01/16/2017 140.55  >60.00 mL/min Final  . Fructosamine 01/16/2017 362* 0 - 285 umol/L Final   Comment: Published reference interval for apparently healthy subjects between age 71 and 58 is 11 - 285 umol/L and in a poorly controlled diabetic population is 228 - 563 umol/L with a mean of 396 umol/L.       Allergies as of 01/24/2017   No Known Allergies     Medication List       Accurate as of 01/24/17  2:02 PM. Always use your most recent med list.          allopurinol 300 MG tablet Commonly known as:  ZYLOPRIM take 1 tablet by mouth once daily   amLODipine 10 MG tablet Commonly known as:  NORVASC Take 1 tablet (10 mg total) by mouth daily.   aspirin 81 MG tablet Take 81 mg by mouth daily.   atorvastatin 40 MG tablet Commonly known as:  LIPITOR Take 1 tablet (40 mg total) by mouth daily.   B-D SINGLE USE SWABS REGULAR Pads Use as necessary   colchicine 0.6 MG tablet Commonly known as:  COLCRYS take 1 tablet by mouth once daily   EASY COMFORT PEN NEEDLES 31G X 5 MM Misc Generic drug:  Insulin Pen Needle use as directed THREE TIMES DAILY with lantus and novolog doses   furosemide 20 MG tablet Commonly known as:  LASIX TAKE 1 TABLET BY MOUTH DAILY   glucose blood test strip Commonly known as:  ONETOUCH VERIO Use as instructed   insulin aspart 100 UNIT/ML FlexPen Commonly known as:  NOVOLOG FLEXPEN Inject 20 units into skin prior to breakfast and 20 units prior to evening meal   insulin degludec 100 UNIT/ML Sopn FlexTouch Pen Commonly known as:  TRESIBA FLEXTOUCH 36 units daily with supper, If am sugars staying < 90 in am reduce tresiba to 33 units   metFORMIN 1000 MG tablet Commonly known as:  GLUCOPHAGE TAKE ONE TABLET BY MOUTH TWICE DAILY. TAKE WITH A MEAL.   metoprolol tartrate 100  MG tablet Commonly known as:  LOPRESSOR Take 1 tablet (100 mg total) by mouth 2 (two) times daily.   nystatin-triamcinolone ointment Commonly known as:  MYCOLOG Apply topically 2 (two)  times daily.   ONETOUCH DELICA LANCETS 62E Misc Use to check blood sugar twice daily. DX Code E11.9   ONETOUCH VERIO IQ SYSTEM w/Device Kit USE TO CHECK BLOOD SUGAR TWICE A DAY DX CODE E11.9   ramipril 10 MG capsule Commonly known as:  ALTACE TAKE ONE CAPSULE BY MOUTH ONCE DAILY.   vardenafil 20 MG tablet Commonly known as:  LEVITRA Take 20 mg by mouth daily as needed.       Allergies:  No Known Allergies  Past Medical History:  Diagnosis Date  . CORONARY ARTERY DISEASE 11/23/2006  . DIABETES MELLITUS, TYPE II 11/23/2006  . GOUT 05/20/2008  . HYPERLIPIDEMIA 11/23/2006  . HYPERTENSION 11/23/2006  . 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 PNEUMONIA, LEFT LOWER LOBE 09/08/2009    Past Surgical History:  Procedure Laterality Date  . CORONARY ANGIOPLASTY WITH STENT PLACEMENT    . CORONARY ARTERY BYPASS GRAFT  2003    Family History  Problem Relation Age of Onset  . Heart disease Mother   . Heart disease Father   . Diabetes Sister   . Diabetes Brother     Social History:  reports that he has never smoked. He has never used smokeless tobacco. He reports that he does not drink alcohol or use drugs.    Review of Systems        Lipids:  LDL Adequately treated with Lipitor40 mg, this was started after his MI, HDL  low       Lab Results  Component Value Date   CHOL 104 03/30/2016   HDL 32.60 (L) 03/30/2016   LDLCALC 57 03/30/2016   TRIG 70.0 03/30/2016   CHOLHDL 3 03/30/2016       The blood pressure has been treated with Norvasc10 mg, ramipril and metoprolol, Followed by PCP Tends to have mild white coat syndrome    BP Readings from Last 3 Encounters:  01/24/17 (!) 142/90  12/28/16 (!) 148/82  11/14/16 (!) 366/29     Complications: No evidence of neuropathy or  nephropathy  Diabetic foot exam was done in 3/18        LABS:  No visits with results within 1 Week(s) from this visit.  Latest known visit with results is:  Lab on 01/16/2017  Component Date Value Ref Range Status  . Sodium 01/16/2017 136  135 - 145 mEq/L Final  . Potassium 01/16/2017 3.9  3.5 - 5.1 mEq/L Final  . Chloride 01/16/2017 100  96 - 112 mEq/L Final  . CO2 01/16/2017 28  19 - 32 mEq/L Final  . Glucose, Bld 01/16/2017 239* 70 - 99 mg/dL Final  . BUN 01/16/2017 8  6 - 23 mg/dL Final  . Creatinine, Ser 01/16/2017 0.71  0.40 - 1.50 mg/dL Final  . Calcium 01/16/2017 9.7  8.4 - 10.5 mg/dL Final  . GFR 01/16/2017 140.55  >60.00 mL/min Final  . Fructosamine 01/16/2017 362* 0 - 285 umol/L Final   Comment: Published reference interval for apparently healthy subjects between age 77 and 31 is 87 - 285 umol/L and in a poorly controlled diabetic population is 228 - 563 umol/L with a mean of 396 umol/L.     Physical Examination:  BP (!) 142/90   Pulse 68   Ht _0  (1.803 m)   Wt 219 lb 12.8 oz (99.7 kg)   SpO2 97%   BMI 30.66 kg/m        ASSESSMENT/PLAN  Diabetes type 2, On insulin   See history of present  illness for detailed discussion of current management, problems identified and blood sugar patterns.  Blood sugars are not well-controlled and not clear why they are much higher than the last visit even with increasing his basal insulin Fasting readings are mostly over 200 Not clear if his basal insulin is expired or having a bad pen  He is not watching his diet but eating sweets and drinking sweet drinks periodically Also may not always taking his NovoLog at suppertime Need more glucose monitoring after supper which he is not doing   Recommendations: Increase his [name] to 48 units Given him a flowsheet up with detailed explanation of increasing the Tresiba dose every 3 days by 2 units until his morning sugars are coming below 130 He will take 24 units  NovoLog at suppertime if he is eating out He will take his evening insulin more consistently before eating Follow-up with nurse educator to troubleshoot in about 3 weeks Avoid keeping his insulin at hot temperatures Make sure his NovoLog is not expired at home Again may consider V-go pump if blood sugars are still difficult to control, he has refused this previously    Patient Instructions  Tresiba 48 units and adjust as directed every 3 days  Novolog 24 at supper when eating out  Cut back on sweets and sweet drinks  Check blood sugars on waking up  daily  Also check blood sugars about 2 hours after a meal and do this after different meals by rotation  Recommended blood sugar levels on waking up is 90-130 and about 2 hours after meal is 130-160  Please bring your blood sugar monitor to each visit, thank you     Counseling time on subjects discussed in assessment and plan sections is over 50% of today's 25 minute visit     Finnlee Guarnieri 01/24/2017, 2:02 PM   Note: This office note was prepared with Dragon voice recognition system technology. Any transcriptional errors that result from this process are unintentional.

## 2017-02-08 ENCOUNTER — Other Ambulatory Visit: Payer: Self-pay | Admitting: Family Medicine

## 2017-02-10 ENCOUNTER — Telehealth: Payer: Self-pay

## 2017-02-10 NOTE — Telephone Encounter (Signed)
Received PA request for Nystatin-Triamcinolone. PA submitted & is pending. Key: [id number]

## 2017-02-14 ENCOUNTER — Encounter: Payer: PPO | Attending: Endocrinology | Admitting: Nutrition

## 2017-02-14 DIAGNOSIS — Z794 Long term (current) use of insulin: Secondary | ICD-10-CM | POA: Diagnosis not present

## 2017-02-14 DIAGNOSIS — Z713 Dietary counseling and surveillance: Secondary | ICD-10-CM | POA: Insufficient documentation

## 2017-02-14 DIAGNOSIS — E118 Type 2 diabetes mellitus with unspecified complications: Secondary | ICD-10-CM

## 2017-02-14 DIAGNOSIS — E1165 Type 2 diabetes mellitus with hyperglycemia: Secondary | ICD-10-CM | POA: Insufficient documentation

## 2017-02-14 NOTE — Progress Notes (Signed)
Pt. Did not bring meter to visit, and did not test this AM He reports that he is taking [name] 48u in the AM as well as Novolog 20--before breakfast (10AM) Typical day: 9-10AM up.  Drinks 12oz apple juice with pills, 10AM: bfast of cornflakes, with whole banana, and 2% milk.  Or tuna or samin or egg with grits.  No toast, unless he has no grits.  Water to drink, or gatorade 6PM: 4-5 ounces protein with turnip greens and mixed veg. In a can, and/or pinto beans.  No bread, and sometimes drinking lemonade that he buys at [employer].  Says it is not unsweetened.   6:30-7PM walks  7PM_1AM.  Watches TV and eats deserts.  2 cup cakes or "something sweet".  He does not test his blood sugar at HS.  Suggested he test his blood sugar tonight at HS, and again in the AM and I will call him to see what the readings are.  It was suggested that he may need a small amount of Novolog for the snacking after supper, and he was agreeable with this.  Told him we will wait to see what the readings shown tomorrow.   Other suggestions given: Limit juice to 4 ounces with pills in the AM.   Stop drinking Gatorade and switch to lite, or diet Gatorade. Switch to diet Lemonade.  You can get this at [employer],

## 2017-02-15 ENCOUNTER — Telehealth: Payer: Self-pay | Admitting: Nutrition

## 2017-02-15 NOTE — Patient Instructions (Signed)
Limit juice to 4 ounces with pills in the AM.   Stop drinking Gatorade and switch to lite, or diet Gatorade. Switch to diet Lemonade.  You can get this at [employer]

## 2017-02-15 NOTE — Telephone Encounter (Signed)
Mr. Andujo reports that he forgot to test last night at bedtime, but his FBS today was 128.  He took 20u Novolog before supper, but did not eat any desert last night which he normally does.   I told him that if he eats a piece of pie, or 2 small cupcakes, or any other sweet, he will probably need to take 4u of Novolog.  He agreed to do this.

## 2017-02-16 ENCOUNTER — Other Ambulatory Visit: Payer: Self-pay

## 2017-03-03 ENCOUNTER — Other Ambulatory Visit (INDEPENDENT_AMBULATORY_CARE_PROVIDER_SITE_OTHER): Payer: PPO

## 2017-03-03 DIAGNOSIS — E1165 Type 2 diabetes mellitus with hyperglycemia: Secondary | ICD-10-CM | POA: Diagnosis not present

## 2017-03-03 DIAGNOSIS — Z794 Long term (current) use of insulin: Secondary | ICD-10-CM

## 2017-03-03 LAB — BASIC METABOLIC PANEL
BUN: 7 mg/dL (ref 6–23)
CO2: 30 mEq/L (ref 19–32)
Calcium: 9.7 mg/dL (ref 8.4–10.5)
Chloride: 106 mEq/L (ref 96–112)
Creatinine, Ser: 0.71 mg/dL (ref 0.40–1.50)
GFR: 140.5 mL/min (ref >60.00)
Glucose, Bld: 58 mg/dL — ABNORMAL LOW (ref 70–99)
Potassium: 3.5 mEq/L (ref 3.5–5.1)
Sodium: 143 mEq/L (ref 135–145)

## 2017-03-08 ENCOUNTER — Ambulatory Visit (INDEPENDENT_AMBULATORY_CARE_PROVIDER_SITE_OTHER): Payer: PPO | Admitting: Endocrinology

## 2017-03-08 ENCOUNTER — Encounter: Payer: Self-pay | Admitting: Endocrinology

## 2017-03-08 VITALS — BP 144/90 | HR 68 | Ht 71.0 in | Wt 222.4 lb

## 2017-03-08 DIAGNOSIS — E1165 Type 2 diabetes mellitus with hyperglycemia: Secondary | ICD-10-CM | POA: Diagnosis not present

## 2017-03-08 DIAGNOSIS — Z794 Long term (current) use of insulin: Secondary | ICD-10-CM

## 2017-03-08 NOTE — Patient Instructions (Addendum)
Stay on 42 Tresiba  24 units NovoLog at suppertime if is eating out  MORE SUGARS 2 HRS AFTER MEALS

## 2017-03-08 NOTE — Progress Notes (Signed)
Patient ID: Patrick Wilcox, male   DOB: Jun 18, 1946, 71 y.o.   MRN: [id number]    Reason for Appointment: Followup for Type 2 Diabetes  Referring physician: Burnice Logan  History of Present Illness:          Diagnosis: Type 2 diabetes mellitus, date of diagnosis: 2004       Past history:  At diagnosis he was having symptoms of drowsiness, difficulty breathing and blood sugar was reportedly over 600 He was initially treated with insulin but subsequently switched back to oral drugs He may have taken metformin and other medications for some time before going back on insulin His records indicate that he had been taking Amaryl and Januvia before going back on insulin in mid-2014 when his A1c had gone up over 13 Initially was given premixed insulin and then switched to Lantus and Humalog Record of his A1c indicates that his levels previously had been over 8% since 02/2012 He  had much better blood sugar control on his last visit with his changing his lifestyle and being instructed by the nurse educator  His Lantus did not appear to have 24 hour control with once a day injection  Recent history:   INSULIN regimen is described as:  42 units Tresiba daily in p.m., Novolog 20 ac breakfast and 20 supper   A1c in 5/15 was back up to 8.2, has been as low as 7.5  Glucose monitoring, current management and problems identified:  He was having much higher blood sugars prior to his last visit in July and were not able to identify the reason for this  He was sent to the diabetes educator to review his management and diet  Appears that he was doing poorly on his diet especially with snacking and eating sweets at night as well as the drinking juice and eating cereal in the morning  His TRESIBA was increased by about 10 units also  His blood sugars have started improving to think this is also partly related to his moved diet at least for the last 10 days or so  More recently his FASTING  readings are relatively better although lowest reading only 111  He did reduce his TRESIBA by 6 units a few days ago as he thought his blood sugars were getting on the low side, however more hypoglycemia or blood sugars below 100 documented  Despite reminders he has only done one reading after evening meal  DIET: He is cutting back on sweets and high-fat foods and snacks as well as trying to avoid cereal and juices in the morning  However he was told by the nurse educator that if he does dessert at night he will take extra 4 units of Novolog  He has tried to increase the duration of his walking also  However his weight has not improved  DIET: Has seen the dietitian in 2014 and nurse educator in 8/18       Meals: 2 meals per day 10 am and 6 pm.   Breakfast is cereal +eggs;  toast, eggs, has fruit for snacks.  Evening meal usually vegetables, tuna salad, chicken.         Oral hypoglycemic drugs the patient is taking are: Metformin 1 g twice a day       Compliance with the medical regimen:  Fair Hypoglycemia: None    Glucose monitoring: Checking less than 1 times a day        Glucometer:  Accu-Chek    Blood  Glucose readings  from  Download  Mean values apply above for all meters except median for One Touch  PRE-MEAL Fasting Lunch Dinner Bedtime Overall  Glucose range:       Mean/median:        POST-MEAL PC Breakfast PC Lunch PC Dinner  Glucose range:     Mean/median:        Mean values apply above for all meters except median for One Touch  PRE-MEAL Fasting Lunch Dinner Bedtime Overall  Glucose range: 132-307       Mean/median: 215     219    POST-MEAL PC Breakfast PC Lunch PC Dinner  Glucose range:   223, 269   Mean/median:        Exercise:  walking 25-30 min  Weight history:  Wt Readings from Last 3 Encounters:  03/08/17 222 lb 6.4 oz (100.9 kg)  01/24/17 219 lb 12.8 oz (99.7 kg)  12/28/16 221 lb 12.8 oz (100.6 kg)    Glycemic control:   Lab Results    Component Value Date   HGBA1C 8.2 (H) 11/11/2016   HGBA1C 8.1 (H) 06/28/2016   HGBA1C 7.5 (H) 03/30/2016   Lab Results  Component Value Date   MICROALBUR 33.7 (H) 03/30/2016   LDLCALC 57 03/30/2016   CREATININE 0.71 03/03/2017    Other active problems: See review of systems   Lab on 03/03/2017  Component Date Value Ref Range Status  . Sodium 03/03/2017 143  135 - 145 mEq/L Final  . Potassium 03/03/2017 3.5  3.5 - 5.1 mEq/L Final  . Chloride 03/03/2017 106  96 - 112 mEq/L Final  . CO2 03/03/2017 30  19 - 32 mEq/L Final  . Glucose, Bld 03/03/2017 58* 70 - 99 mg/dL Final  . BUN 03/03/2017 7  6 - 23 mg/dL Final  . Creatinine, Ser 03/03/2017 0.71  0.40 - 1.50 mg/dL Final  . Calcium 03/03/2017 9.7  8.4 - 10.5 mg/dL Final  . GFR 03/03/2017 140.50  >60.00 mL/min Final      Allergies as of 03/08/2017   No Known Allergies     Medication List       Accurate as of 03/08/17  1:59 PM. Always use your most recent med list.          allopurinol 300 MG tablet Commonly known as:  ZYLOPRIM take 1 tablet by mouth once daily   amLODipine 10 MG tablet Commonly known as:  NORVASC Take 1 tablet (10 mg total) by mouth daily.   aspirin 81 MG tablet Take 81 mg by mouth daily.   atorvastatin 40 MG tablet Commonly known as:  LIPITOR Take 1 tablet (40 mg total) by mouth daily.   B-D SINGLE USE SWABS REGULAR Pads Use as necessary   colchicine 0.6 MG tablet Commonly known as:  COLCRYS take 1 tablet by mouth once daily   EASY COMFORT PEN NEEDLES 31G X 5 MM Misc Generic drug:  Insulin Pen Needle use as directed THREE TIMES DAILY with lantus and novolog doses   furosemide 20 MG tablet Commonly known as:  LASIX TAKE 1 TABLET BY MOUTH DAILY   glucose blood test strip Commonly known as:  ONETOUCH VERIO Use as instructed   insulin aspart 100 UNIT/ML FlexPen Commonly known as:  NOVOLOG FLEXPEN Inject 20 units into skin prior to breakfast and 20 units prior to evening meal    insulin degludec 100 UNIT/ML Sopn FlexTouch Pen Commonly known as:  TRESIBA FLEXTOUCH 36 units daily with supper, If  am sugars staying < 90 in am reduce tresiba to 33 units   metFORMIN 1000 MG tablet Commonly known as:  GLUCOPHAGE TAKE ONE TABLET BY MOUTH TWICE DAILY. TAKE WITH A MEAL.   metoprolol tartrate 100 MG tablet Commonly known as:  LOPRESSOR Take 1 tablet (100 mg total) by mouth 2 (two) times daily.   nystatin-triamcinolone ointment Commonly known as:  MYCOLOG Apply topically 2 (two) times daily.   ONETOUCH DELICA LANCETS 72Z Misc Use to check blood sugar twice daily. DX Code E11.9   ONETOUCH VERIO IQ SYSTEM w/Device Kit USE TO CHECK BLOOD SUGAR TWICE A DAY DX CODE E11.9   ramipril 10 MG capsule Commonly known as:  ALTACE TAKE ONE CAPSULE BY MOUTH ONCE DAILY.   vardenafil 20 MG tablet Commonly known as:  LEVITRA Take 20 mg by mouth daily as needed.       Allergies:  No Known Allergies  Past Medical History:  Diagnosis Date  . CORONARY ARTERY DISEASE 11/23/2006  . DIABETES MELLITUS, TYPE II 11/23/2006  . GOUT 05/20/2008  . HYPERLIPIDEMIA 11/23/2006  . HYPERTENSION 11/23/2006  . 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 PNEUMONIA, LEFT LOWER LOBE 09/08/2009    Past Surgical History:  Procedure Laterality Date  . CORONARY ANGIOPLASTY WITH STENT PLACEMENT    . CORONARY ARTERY BYPASS GRAFT  2003    Family History  Problem Relation Age of Onset  . Heart disease Mother   . Heart disease Father   . Diabetes Sister   . Diabetes Brother     Social History:  reports that he has never smoked. He has never used smokeless tobacco. He reports that he does not drink alcohol or use drugs.    Review of Systems        Lipids:  LDL Adequately treated with Lipitor40 mg, this was started after his MI        Lab Results  Component Value Date   CHOL 104 03/30/2016   HDL 32.60 (L) 03/30/2016   LDLCALC 57 03/30/2016   TRIG 70.0 03/30/2016   CHOLHDL 3 03/30/2016        The blood pressure has been treated with Norvasc10 mg, ramipril and metoprolol, Followed by PCP Tends to have mild white coat syndrome  BP Readings from Last 3 Encounters:  03/08/17 (!) 144/90  01/24/17 (!) 142/90  12/28/16 (!) 366/44     Complications: No evidence of neuropathy or nephropathy  Diabetic foot exam was done in 3/18        LABS:  Lab on 03/03/2017  Component Date Value Ref Range Status  . Sodium 03/03/2017 143  135 - 145 mEq/L Final  . Potassium 03/03/2017 3.5  3.5 - 5.1 mEq/L Final  . Chloride 03/03/2017 106  96 - 112 mEq/L Final  . CO2 03/03/2017 30  19 - 32 mEq/L Final  . Glucose, Bld 03/03/2017 58* 70 - 99 mg/dL Final  . BUN 03/03/2017 7  6 - 23 mg/dL Final  . Creatinine, Ser 03/03/2017 0.71  0.40 - 1.50 mg/dL Final  . Calcium 03/03/2017 9.7  8.4 - 10.5 mg/dL Final  . GFR 03/03/2017 140.50  >60.00 mL/min Final    Physical Examination:  BP (!) 144/90   Pulse 68   Ht '5\' 11"'  (1.803 m)   Wt 222 lb 6.4 oz (100.9 kg)   SpO2 98%   BMI 31.02 kg/m        ASSESSMENT/PLAN  Diabetes type 2, On insulin   See history of present  illness for detailed discussion of current management, problems identified and blood sugar patterns.  Blood sugars are improving with not only watching his diet better as instructed by nurse educator were also increasing his [name] FRUCTOSAMINE is 294 which is relatively good compared to his previous A1c of 8.2 His day-to-day management also discussed including need for more postprandial monitoring  Recommendations: No change in insulin regimen He will take extra 4 units at suppertime if eating a large meal or a snack or  going out to eat Needs to alternate fasting and readings after meals including after breakfast A1c in 2 months  Patient Instructions  Stay on 42 Tresiba  24 units NovoLog at suppertime if is eating out  MORE SUGARS 2 [place] 03/08/2017, 1:59 PM   Note: This office  note was prepared with Dragon voice recognition system technology. Any transcriptional errors that result from this process are unintentional.

## 2017-03-09 LAB — FRUCTOSAMINE: Fructosamine: 294 umol/L — ABNORMAL HIGH (ref 0–285)

## 2017-03-15 ENCOUNTER — Telehealth: Payer: Self-pay | Admitting: Endocrinology

## 2017-03-15 NOTE — Telephone Encounter (Signed)
Patient needs renewal on insulin aspart (NOVOLOG FLEXPEN) 100 UNIT/ML FlexPen to  [place], [place] - [address] [phone] (Phone) [phone] (Fax)

## 2017-03-16 ENCOUNTER — Other Ambulatory Visit: Payer: Self-pay

## 2017-03-16 MED ORDER — INSULIN ASPART 100 UNIT/ML FLEXPEN: PEN_INJECTOR | SUBCUTANEOUS | 1 refills | Status: DC

## 2017-03-16 NOTE — Telephone Encounter (Signed)
Called patient and left a voice message to let him know that I have sent a refill for his Novolog to the [facility] for him.

## 2017-05-12 ENCOUNTER — Telehealth: Payer: Self-pay | Admitting: Internal Medicine

## 2017-05-12 MED ORDER — INSULIN ASPART 100 UNIT/ML FLEXPEN: PEN_INJECTOR | SUBCUTANEOUS | 1 refills | Status: DC

## 2017-05-12 MED ORDER — INSULIN DEGLUDEC 100 UNIT/ML ~~LOC~~ SOPN: PEN_INJECTOR | SUBCUTANEOUS | 2 refills | Status: DC

## 2017-05-12 MED ORDER — COLCHICINE 0.6 MG PO TABS: ORAL_TABLET | ORAL | 3 refills | Status: DC

## 2017-05-12 MED ORDER — VARDENAFIL HCL 20 MG PO TABS: 20.0000 mg | ORAL_TABLET | Freq: Every day | ORAL | 2 refills | Status: DC | PRN

## 2017-05-12 NOTE — Telephone Encounter (Signed)
colchicine (COLCRYS) 0.6 MG tablet  insulin aspart (NOVOLOG FLEXPEN) 100 UNIT/ML FlexPen  insulin degludec (TRESIBA FLEXTOUCH) 100 UNIT/ML SOPN FlexTouch Pen  sikdenadil 20MG  TABLET were refilled

## 2017-05-12 NOTE — Telephone Encounter (Signed)
Medication Samples have been provided to the patient.  Drug name: [name]     Strength: 100units/mL (U-100)        Qty: 1 pen  LOT: [id number]  Exp.Date: 11/2018   The patient has been instructed regarding the correct time, dose, and frequency of taking this medication, including desired effects and most common side effects.   Francetta Found Lakeyta Vandenheuvel 1:03 PM 05/12/2017

## 2017-05-12 NOTE — Telephone Encounter (Signed)
colchicine (COLCRYS) 0.6 MG tablet  insulin aspart (NOVOLOG FLEXPEN) 100 UNIT/ML FlexPen  insulin degludec (TRESIBA FLEXTOUCH) 100 UNIT/ML SOPN FlexTouch Pen  sikdenadil 20MG  TABLET

## 2017-05-15 ENCOUNTER — Telehealth: Payer: Self-pay | Admitting: Internal Medicine

## 2017-05-15 NOTE — Telephone Encounter (Signed)
Received PA request for Colcrys. PA submitted via covermymeds. Key: Patrick Wilcox

## 2017-05-23 ENCOUNTER — Telehealth: Payer: Self-pay

## 2017-05-23 NOTE — Telephone Encounter (Signed)
Copied from [place] #[id number]. Topic: Inquiry >> May 15, 2017 10:49 AM Self, Rande Brunt, CMA wrote: Received PA request for Levitra 20 mg tablet. PA submitted via covermymeds. Key: [id number]

## 2017-05-25 ENCOUNTER — Other Ambulatory Visit: Payer: Self-pay | Admitting: Family Medicine

## 2017-05-26 ENCOUNTER — Other Ambulatory Visit: Payer: Self-pay

## 2017-06-05 ENCOUNTER — Other Ambulatory Visit (INDEPENDENT_AMBULATORY_CARE_PROVIDER_SITE_OTHER): Payer: PPO

## 2017-06-05 DIAGNOSIS — Z794 Long term (current) use of insulin: Secondary | ICD-10-CM

## 2017-06-05 DIAGNOSIS — E1165 Type 2 diabetes mellitus with hyperglycemia: Secondary | ICD-10-CM

## 2017-06-05 LAB — BASIC METABOLIC PANEL
BUN: 10 mg/dL (ref 6–23)
CO2: 28 mEq/L (ref 19–32)
Calcium: 9.6 mg/dL (ref 8.4–10.5)
Chloride: 102 mEq/L (ref 96–112)
Creatinine, Ser: 0.72 mg/dL (ref 0.40–1.50)
GFR: 138.15 mL/min (ref >60.00)
Glucose, Bld: 209 mg/dL — ABNORMAL HIGH (ref 70–99)
Potassium: 3.8 mEq/L (ref 3.5–5.1)
Sodium: 138 mEq/L (ref 135–145)

## 2017-06-05 LAB — LIPID PANEL
Cholesterol: 101 mg/dL (ref 0–200)
HDL: 28.8 mg/dL — ABNORMAL LOW (ref >39.00)
LDL Cholesterol: 57 mg/dL (ref 0–99)
NonHDL: 72.52
Total CHOL/HDL Ratio: 4
Triglycerides: 79 mg/dL (ref 0.0–149.0)
VLDL: 15.8 mg/dL (ref 0.0–40.0)

## 2017-06-05 LAB — MICROALBUMIN / CREATININE URINE RATIO
Creatinine,U: 126.8 mg/dL
Microalb Creat Ratio: 21.1 mg/g (ref 0.0–30.0)
Microalb, Ur: 26.8 mg/dL — ABNORMAL HIGH (ref 0.0–1.9)

## 2017-06-05 LAB — HEMOGLOBIN A1C: Hgb A1c MFr Bld: 7.6 % — ABNORMAL HIGH (ref 4.6–6.5)

## 2017-06-08 ENCOUNTER — Encounter: Payer: Self-pay | Admitting: Endocrinology

## 2017-06-08 ENCOUNTER — Ambulatory Visit: Payer: PPO | Admitting: Endocrinology

## 2017-06-08 VITALS — BP 140/88 | HR 65 | Ht 71.0 in | Wt 217.2 lb

## 2017-06-08 DIAGNOSIS — E1165 Type 2 diabetes mellitus with hyperglycemia: Secondary | ICD-10-CM

## 2017-06-08 DIAGNOSIS — Z794 Long term (current) use of insulin: Secondary | ICD-10-CM

## 2017-06-08 DIAGNOSIS — Z23 Encounter for immunization: Secondary | ICD-10-CM | POA: Diagnosis not present

## 2017-06-08 NOTE — Patient Instructions (Addendum)
Reduce Breakfast Novolog to 18-20  TRESIBA 40 UNITS DAILY, MAY GO up or down 2 units if am sugars stay > 140 or < 80

## 2017-06-08 NOTE — Progress Notes (Signed)
Patient ID: Patrick Wilcox, male   DOB: [birth year], 71 y.o.   MRN: [id number]    Reason for Appointment: Followup for Type 2 Diabetes  Referring physician: Burnice Logan  History of Present Illness:          Diagnosis: Type 2 diabetes mellitus, date of diagnosis: 2004       Past history:  At diagnosis he was having symptoms of drowsiness, difficulty breathing and blood sugar was reportedly over 600 He was initially treated with insulin but subsequently switched back to oral drugs He may have taken metformin and other medications for some time before going back on insulin His records indicate that he had been taking Amaryl and Januvia before going back on insulin in mid-2014 when his A1c had gone up over 13 Initially was given premixed insulin and then switched to Lantus and Humalog Record of his A1c indicates that his levels previously had been over 8% since 02/2012 He  had much better blood sugar control on his last visit with his changing his lifestyle and being instructed by the nurse educator  His Lantus did not appear to have 24 hour control with once a day injection  Recent history:   INSULIN regimen is described as:  42 units Tresiba daily in p.m., Novolog 22 ac breakfast and 22 supper   A1c has come down slightly to 7.6 compared to 8.2 before  Glucose monitoring, current management and problems identified:  He had some difficulty getting his [name] refilled and has not taken any for the last 3 weeks  He thinks his sugars did not go up very much but they are occasionally as high as 230 in the morning and over 200 in the lab  He says he is taking 2 units more of the Novolog since he was out of the [name]  Again he is forgetting to check his readings POSTPRANDIALLY except sometimes after breakfast  FASTING readings are mostly high except possibly once when it was 140  Although he has not felt any hypoglycemia his lowest reading was 78 late afternoon last  month  He has generally done better with his diet with portion and carbohydrate control and has able to lose a little weight  DIET: Has seen the dietitian in 2014 and nurse educator in 8/18       Meals: 2 meals per day 10 am and 6 pm.   Breakfast is  eggs;  toast, eggs, has fruit for snacks.  Evening meal usually vegetables, tuna salad, chicken.         Oral hypoglycemic drugs the patient is taking are: Metformin 1 g twice a day       Compliance with the medical regimen:  Fair Hypoglycemia: None    Glucose monitoring: Checking less than 1 times a day        Glucometer:  Accu-Chek    Blood Glucose readings  from  Download  Mean values apply above for all meters except median for One Touch  PRE-MEAL Fasting Lunch Dinner Bedtime Overall  Glucose range: 157-184    147, 167    Mean/median: 170    171   POST-MEAL PC Breakfast PC Lunch PC Dinner  Glucose range: 130-230     Mean/median:          Exercise:  walking 25-30 min 2-3/7  Weight history:  Wt Readings from Last 3 Encounters:  06/08/17 217 lb 3.2 oz (98.5 kg)  03/08/17 222 lb 6.4 oz (100.9 kg)  01/24/17  219 lb 12.8 oz (99.7 kg)    Glycemic control:   Lab Results  Component Value Date   HGBA1C 7.6 (H) 06/05/2017   HGBA1C 8.2 (H) 11/11/2016   HGBA1C 8.1 (H) 06/28/2016   Lab Results  Component Value Date   MICROALBUR 26.8 (H) 06/05/2017   LDLCALC 57 06/05/2017   CREATININE 0.72 06/05/2017    Other active problems: See review of systems   Lab on 06/05/2017  Component Date Value Ref Range Status  . Cholesterol 06/05/2017 101  0 - 200 mg/dL Final   ATP III Classification       Desirable:  < 200 mg/dL               Borderline High:  200 - 239 mg/dL          High:  > = 240 mg/dL  . Triglycerides 06/05/2017 79.0  0.0 - 149.0 mg/dL Final   Normal:  <150 mg/dLBorderline High:  150 - 199 mg/dL  . HDL 06/05/2017 28.80* >39.00 mg/dL Final  . VLDL 06/05/2017 15.8  0.0 - 40.0 mg/dL Final  . LDL Cholesterol  06/05/2017 57  0 - 99 mg/dL Final  . Total CHOL/HDL Ratio 06/05/2017 4   Final                  Men          Women1/2 Average Risk     3.4          3.3Average Risk          5.0          4.42X Average Risk          9.6          7.13X Average Risk          15.0          11.0                      . NonHDL 06/05/2017 72.52   Final   NOTE:  Non-HDL goal should be 30 mg/dL higher than patient's LDL goal (i.e. LDL goal of < 70 mg/dL, would have non-HDL goal of < 100 mg/dL)  . Microalb, Ur 06/05/2017 26.8* 0.0 - 1.9 mg/dL Final  . Creatinine,U 06/05/2017 126.8  mg/dL Final  . Microalb Creat Ratio 06/05/2017 21.1  0.0 - 30.0 mg/g Final  . Sodium 06/05/2017 138  135 - 145 mEq/L Final  . Potassium 06/05/2017 3.8  3.5 - 5.1 mEq/L Final  . Chloride 06/05/2017 102  96 - 112 mEq/L Final  . CO2 06/05/2017 28  19 - 32 mEq/L Final  . Glucose, Bld 06/05/2017 209* 70 - 99 mg/dL Final  . BUN 06/05/2017 10  6 - 23 mg/dL Final  . Creatinine, Ser 06/05/2017 0.72  0.40 - 1.50 mg/dL Final  . Calcium 06/05/2017 9.6  8.4 - 10.5 mg/dL Final  . GFR 06/05/2017 138.15  >60.00 mL/min Final  . Hgb A1c MFr Bld 06/05/2017 7.6* 4.6 - 6.5 % Final   Glycemic Control Guidelines for People with Diabetes:Non Diabetic:  <6%Goal of Therapy: <7%Additional Action Suggested:  >8%       Allergies as of 06/08/2017   No Known Allergies     Medication List        Accurate as of 06/08/17 10:30 AM. Always use your most recent med list.          allopurinol 300 MG tablet Commonly known as:  ZYLOPRIM  take 1 tablet by mouth once daily   amLODipine 10 MG tablet Commonly known as:  NORVASC Take 1 tablet (10 mg total) by mouth daily.   aspirin 81 MG tablet Take 81 mg by mouth daily.   atorvastatin 40 MG tablet Commonly known as:  LIPITOR Take 1 tablet (40 mg total) by mouth daily.   B-D SINGLE USE SWABS REGULAR Pads Use as necessary   colchicine 0.6 MG tablet Commonly known as:  COLCRYS take 1 tablet by mouth once  daily   EASY COMFORT PEN NEEDLES 31G X 5 MM Misc Generic drug:  Insulin Pen Needle use as directed THREE TIMES DAILY with lantus and novolog doses   furosemide 20 MG tablet Commonly known as:  LASIX TAKE 1 TABLET BY MOUTH DAILY   glucose blood test strip Commonly known as:  ONETOUCH VERIO Use as instructed   insulin aspart 100 UNIT/ML FlexPen Commonly known as:  NOVOLOG FLEXPEN Inject 20-22 units into skin prior to breakfast and 20-22 units prior to evening meal   insulin degludec 100 UNIT/ML Sopn FlexTouch Pen Commonly known as:  TRESIBA FLEXTOUCH 42 units daily with supper, If am sugars staying < 90 in am reduce tresiba to 38 units   metFORMIN 1000 MG tablet Commonly known as:  GLUCOPHAGE TAKE ONE TABLET BY MOUTH TWICE DAILY. TAKE WITH A MEAL.   metoprolol tartrate 100 MG tablet Commonly known as:  LOPRESSOR Take 1 tablet (100 mg total) by mouth 2 (two) times daily.   nystatin-triamcinolone ointment Commonly known as:  MYCOLOG Apply topically 2 (two) times daily.   ONETOUCH DELICA LANCETS 96V Misc Use to check blood sugar twice daily. DX Code E11.9   ONETOUCH VERIO IQ SYSTEM w/Device Kit USE TO CHECK BLOOD SUGAR TWICE A DAY DX CODE E11.9   ramipril 10 MG capsule Commonly known as:  ALTACE TAKE ONE CAPSULE BY MOUTH ONCE DAILY.   vardenafil 20 MG tablet Commonly known as:  LEVITRA Take 1 tablet (20 mg total) by mouth daily as needed.       Allergies:  No Known Allergies  Past Medical History:  Diagnosis Date  . CORONARY ARTERY DISEASE 11/23/2006  . DIABETES MELLITUS, TYPE II 11/23/2006  . GOUT 05/20/2008  . HYPERLIPIDEMIA 11/23/2006  . HYPERTENSION 11/23/2006  . 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 PNEUMONIA, LEFT LOWER LOBE 09/08/2009    Past Surgical History:  Procedure Laterality Date  . CORONARY ANGIOPLASTY WITH STENT PLACEMENT    . CORONARY ARTERY BYPASS GRAFT  2003    Family History  Problem Relation Age of Onset  . Heart disease Mother    . Heart disease Father   . Diabetes Sister   . Diabetes Brother     Social History:  reports that  has never smoked. he has never used smokeless tobacco. He reports that he does not drink alcohol or use drugs.    Review of Systems        Lipids:  LDL Adequately treated with Lipitor40 mg, this was started after his MI        Lab Results  Component Value Date   CHOL 101 06/05/2017   HDL 28.80 (L) 06/05/2017   LDLCALC 57 06/05/2017   TRIG 79.0 06/05/2017   CHOLHDL 4 06/05/2017       The blood pressure has been treated with Norvasc10 mg, ramipril and metoprolol, Followed by PCP Tends to have mild white coat syndrome, blood pressure generally higher here  BP Readings from Last 3 Encounters:  06/08/17 140/88  03/08/17 (!) 144/90  01/24/17 (!) 916/94     Complications: No evidence of neuropathy or nephropathy  Diabetic foot exam was done in 3/18        LABS:  Lab on 06/05/2017  Component Date Value Ref Range Status  . Cholesterol 06/05/2017 101  0 - 200 mg/dL Final   ATP III Classification       Desirable:  < 200 mg/dL               Borderline High:  200 - 239 mg/dL          High:  > = 240 mg/dL  . Triglycerides 06/05/2017 79.0  0.0 - 149.0 mg/dL Final   Normal:  <150 mg/dLBorderline High:  150 - 199 mg/dL  . HDL 06/05/2017 28.80* >39.00 mg/dL Final  . VLDL 06/05/2017 15.8  0.0 - 40.0 mg/dL Final  . LDL Cholesterol 06/05/2017 57  0 - 99 mg/dL Final  . Total CHOL/HDL Ratio 06/05/2017 4   Final                  Men          Women1/2 Average Risk     3.4          3.3Average Risk          5.0          4.42X Average Risk          9.6          7.13X Average Risk          15.0          11.0                      . NonHDL 06/05/2017 72.52   Final   NOTE:  Non-HDL goal should be 30 mg/dL higher than patient's LDL goal (i.e. LDL goal of < 70 mg/dL, would have non-HDL goal of < 100 mg/dL)  . Microalb, Ur 06/05/2017 26.8* 0.0 - 1.9 mg/dL Final  . Creatinine,U 06/05/2017 126.8   mg/dL Final  . Microalb Creat Ratio 06/05/2017 21.1  0.0 - 30.0 mg/g Final  . Sodium 06/05/2017 138  135 - 145 mEq/L Final  . Potassium 06/05/2017 3.8  3.5 - 5.1 mEq/L Final  . Chloride 06/05/2017 102  96 - 112 mEq/L Final  . CO2 06/05/2017 28  19 - 32 mEq/L Final  . Glucose, Bld 06/05/2017 209* 70 - 99 mg/dL Final  . BUN 06/05/2017 10  6 - 23 mg/dL Final  . Creatinine, Ser 06/05/2017 0.72  0.40 - 1.50 mg/dL Final  . Calcium 06/05/2017 9.6  8.4 - 10.5 mg/dL Final  . GFR 06/05/2017 138.15  >60.00 mL/min Final  . Hgb A1c MFr Bld 06/05/2017 7.6* 4.6 - 6.5 % Final   Glycemic Control Guidelines for People with Diabetes:Non Diabetic:  <6%Goal of Therapy: <7%Additional Action Suggested:  >8%     Physical Examination:  BP 140/88   Pulse 65   Ht '5\' 11"'  (1.803 m)   Wt 217 lb 3.2 oz (98.5 kg)   SpO2 98%   BMI 30.29 kg/m        ASSESSMENT/PLAN  Diabetes type 2, On insulin   See history of present illness for detailed discussion of current management, problems identified and blood sugar patterns. A1c 7.6 He is benefiting from improved diet overall However his fasting readings are consistently high with this running out of [name] Discussed  need for basal insulin in addition to mealtime insulin and how it works Also difficult to know what his mealtime insulin requirement is since he does not take readings after meals  Recommendations:  He will start back on 40 units to [name] Discussed that we would reduce this only if fasting readings are relatively low and may need to increase further if fastings are consistently over 140 He can probably reduce his NovoLog in the morning by 2-4 units since he is eating less carbohydrate at breakfast. Consistent walking program   Patient Instructions  Reduce Breakfast Novolog to 18-20  TRESIBA 40 UNITS DAILY, MAY GO up or down 2 units if am sugars stay > 140 or < 80     Influenza vaccine given   Noe Goyer 06/08/2017, 10:30 AM   Note:  This office note was prepared with Estate agent. Any transcriptional errors that result from this process are unintentional.

## 2017-06-22 ENCOUNTER — Other Ambulatory Visit: Payer: Self-pay | Admitting: Family Medicine

## 2017-06-28 ENCOUNTER — Encounter: Payer: Self-pay | Admitting: Internal Medicine

## 2017-06-28 ENCOUNTER — Ambulatory Visit (INDEPENDENT_AMBULATORY_CARE_PROVIDER_SITE_OTHER): Payer: PPO | Admitting: Internal Medicine

## 2017-06-28 VITALS — BP 156/70 | HR 65 | Temp 98.4°F | Ht 71.0 in | Wt 219.2 lb

## 2017-06-28 DIAGNOSIS — I10 Essential (primary) hypertension: Secondary | ICD-10-CM

## 2017-06-28 DIAGNOSIS — I2510 Atherosclerotic heart disease of native coronary artery without angina pectoris: Secondary | ICD-10-CM

## 2017-06-28 DIAGNOSIS — E7800 Pure hypercholesterolemia, unspecified: Secondary | ICD-10-CM | POA: Diagnosis not present

## 2017-06-28 DIAGNOSIS — E1159 Type 2 diabetes mellitus with other circulatory complications: Secondary | ICD-10-CM | POA: Diagnosis not present

## 2017-06-28 MED ORDER — RAMIPRIL 10 MG PO CAPS: 10.0000 mg | ORAL_CAPSULE | Freq: Every day | ORAL | 2 refills | Status: DC

## 2017-06-28 MED ORDER — INSULIN DEGLUDEC 100 UNIT/ML ~~LOC~~ SOPN: PEN_INJECTOR | SUBCUTANEOUS | 3 refills | Status: DC

## 2017-06-28 MED ORDER — NYSTATIN-TRIAMCINOLONE 100000-0.1 UNIT/GM-% EX OINT: TOPICAL_OINTMENT | Freq: Two times a day (BID) | CUTANEOUS | 3 refills | Status: DC

## 2017-06-28 MED ORDER — ATORVASTATIN CALCIUM 40 MG PO TABS: 40.0000 mg | ORAL_TABLET | Freq: Every day | ORAL | 2 refills | Status: DC

## 2017-06-28 MED ORDER — AMLODIPINE BESYLATE 10 MG PO TABS: 10.0000 mg | ORAL_TABLET | Freq: Every day | ORAL | 2 refills | Status: DC

## 2017-06-28 MED ORDER — SILDENAFIL CITRATE 50 MG PO TABS: 50.0000 mg | ORAL_TABLET | Freq: Every day | ORAL | 6 refills | Status: DC | PRN

## 2017-06-28 NOTE — Progress Notes (Signed)
Subjective:    Patient ID: Patrick Wilcox, male    DOB: [birth year], 71 y.o.   MRN: [id number]  HPI   71 year old patient who is seen today for follow-up.  He has essential hypertension coronary artery disease as well as dyslipidemia. He has history of ADD and is requesting refill on Levitra. He is followed by endocrinology and recent hemoglobin A1c improved slightly to 7.6. No cardiac symptoms. He is scheduled for an eye exam next month.  Past Medical History:  Diagnosis Date  . CORONARY ARTERY DISEASE 11/23/2006  . DIABETES MELLITUS, TYPE II 11/23/2006  . GOUT 05/20/2008  . HYPERLIPIDEMIA 11/23/2006  . HYPERTENSION 11/23/2006  . 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 PNEUMONIA, LEFT LOWER LOBE 09/08/2009     Social History   Socioeconomic History  . Marital status: Single    Spouse name: Not on file  . Number of children: Not on file  . Years of education: Not on file  . Highest education level: Not on file  Social Needs  . Financial resource strain: Not on file  . Food insecurity - worry: Not on file  . Food insecurity - inability: Not on file  . Transportation needs - medical: Not on file  . Transportation needs - non-medical: Not on file  Occupational History  . Not on file  Tobacco Use  . Smoking status: Never Smoker  . Smokeless tobacco: Never Used  Substance and Sexual Activity  . Alcohol use: No  . Drug use: No  . Sexual activity: Not on file  Other Topics Concern  . Not on file  Social History Narrative  . Not on file    Past Surgical History:  Procedure Laterality Date  . CORONARY ANGIOPLASTY WITH STENT PLACEMENT    . CORONARY ARTERY BYPASS GRAFT  2003    Family History  Problem Relation Age of Onset  . Heart disease Mother   . Heart disease Father   . Diabetes Sister   . Diabetes Brother     No Known Allergies  Current Outpatient Medications on File Prior to Visit  Medication Sig Dispense Refill  . Alcohol Swabs (B-D SINGLE USE SWABS  REGULAR) PADS Use as necessary 300 each 6  . allopurinol (ZYLOPRIM) 300 MG tablet take 1 tablet by mouth once daily 90 tablet 1  . aspirin 81 MG tablet Take 81 mg by mouth daily.    . Blood Glucose Monitoring Suppl (ONETOUCH VERIO IQ SYSTEM) w/Device KIT USE TO CHECK BLOOD SUGAR TWICE A DAY DX CODE E11.9 1 kit 2  . colchicine (COLCRYS) 0.6 MG tablet take 1 tablet by mouth once daily 90 tablet 3  . EASY COMFORT PEN NEEDLES 31G X 5 MM MISC use as directed THREE TIMES DAILY with lantus and novolog doses 100 each 3  . furosemide (LASIX) 20 MG tablet TAKE 1 TABLET BY MOUTH DAILY 90 tablet 1  . glucose blood (ONETOUCH VERIO) test strip Use as instructed 200 each 1  . insulin aspart (NOVOLOG FLEXPEN) 100 UNIT/ML FlexPen Inject 20-22 units into skin prior to breakfast and 20-22 units prior to evening meal 15 mL 1  . metFORMIN (GLUCOPHAGE) 1000 MG tablet TAKE ONE TABLET BY MOUTH TWICE DAILY. TAKE WITH A MEAL. 180 tablet 1  . metoprolol tartrate (LOPRESSOR) 100 MG tablet Take 1 tablet (100 mg total) by mouth 2 (two) times daily. 180 tablet 1  . ONETOUCH DELICA LANCETS 37T MISC Use to check blood sugar twice daily. DX Code E11.9 200  each 1  . vardenafil (LEVITRA) 20 MG tablet Take 1 tablet (20 mg total) by mouth daily as needed. 10 tablet 2   No current facility-administered medications on file prior to visit.     BP (!) 156/70 (BP Location: Left Arm, Patient Position: Sitting, Cuff Size: Normal)   Pulse 65   Temp 98.4 F (36.9 C) (Oral)   Ht '5\' 11"'  (1.803 m)   Wt 219 lb 3.2 oz (99.4 kg)   SpO2 98%   BMI 30.57 kg/m    Review of Systems  Constitutional: Negative for appetite change, chills, fatigue and fever.  HENT: Negative for congestion, dental problem, ear pain, hearing loss, sore throat, tinnitus, trouble swallowing and voice change.   Eyes: Negative for pain, discharge and visual disturbance.  Respiratory: Negative for cough, chest tightness, wheezing and stridor.   Cardiovascular:  Negative for chest pain, palpitations and leg swelling.  Gastrointestinal: Negative for abdominal distention, abdominal pain, blood in stool, constipation, diarrhea, nausea and vomiting.  Genitourinary: Negative for difficulty urinating, discharge, flank pain, genital sores, hematuria and urgency.  Musculoskeletal: Negative for arthralgias, back pain, gait problem, joint swelling, myalgias and neck stiffness.  Skin: Negative for rash.  Neurological: Negative for dizziness, syncope, speech difficulty, weakness, numbness and headaches.  Hematological: Negative for adenopathy. Does not bruise/bleed easily.  Psychiatric/Behavioral: Negative for behavioral problems and dysphoric mood. The patient is not nervous/anxious.        Objective:   Physical Exam  Constitutional: He is oriented to person, place, and time. He appears well-developed.  Blood pressure 140/76  HENT:  Head: Normocephalic.  Right Ear: External ear normal.  Left Ear: External ear normal.  Eyes: Conjunctivae and EOM are normal.  Neck: Normal range of motion.  Cardiovascular: Normal rate.  Murmur heard. Grade 3/6 systolic murmur  Pulmonary/Chest: Breath sounds normal.  Abdominal: Bowel sounds are normal.  Musculoskeletal: Normal range of motion. He exhibits no edema or tenderness.  Neurological: He is alert and oriented to person, place, and time.  Psychiatric: He has a normal mood and affect. His behavior is normal.          Assessment & Plan:   Diabetes mellitus.  Follow-up endocrinology Essential hypertension.  Continue present therapy Coronary artery disease stable ED.  Medications updated  Follow-up endocrinology CPX here 6 months  Patrick Wilcox Patrick Wilcox

## 2017-06-28 NOTE — Patient Instructions (Signed)
Limit your sodium (Salt) intake  Please check your blood pressure on a regular basis.  If it is consistently greater than 150/90, please make an office appointment.   Please check your hemoglobin A1c every 3 months  Return in 6 months for your annual exam  Please see your eye doctor yearly to check for diabetic eye damage

## 2017-07-06 NOTE — Telephone Encounter (Signed)
Dr K pt 

## 2017-07-13 ENCOUNTER — Other Ambulatory Visit: Payer: Self-pay | Admitting: Family Medicine

## 2017-07-18 NOTE — Telephone Encounter (Signed)
Dr K patient

## 2017-07-19 NOTE — Telephone Encounter (Signed)
Sent to the pharmacy by e-scribe. 

## 2017-08-31 ENCOUNTER — Other Ambulatory Visit: Payer: Self-pay | Admitting: Family Medicine

## 2017-09-04 ENCOUNTER — Other Ambulatory Visit: Payer: PPO

## 2017-09-07 ENCOUNTER — Ambulatory Visit: Payer: PPO | Admitting: Endocrinology

## 2017-10-09 LAB — HM DIABETES EYE EXAM

## 2017-10-13 ENCOUNTER — Encounter: Payer: Self-pay | Admitting: Internal Medicine

## 2017-10-19 ENCOUNTER — Telehealth: Payer: Self-pay | Admitting: Internal Medicine

## 2017-10-19 MED ORDER — INSULIN ASPART 100 UNIT/ML FLEXPEN: PEN_INJECTOR | SUBCUTANEOUS | 0 refills | Status: DC

## 2017-10-19 NOTE — Telephone Encounter (Signed)
Pt walked in today here at office, is completely out of Novolog, was requesting samples/we checked and there is no samples available. I did send in script e-scribe to [facility] just now. Patient will need the refill on ointment and this particular pharmacy will be closing on 10/25/2017.

## 2017-10-19 NOTE — Telephone Encounter (Signed)
Pt is in the office today stating that the pharmacy has been calling the office for the following Rx NOVOLOG FLEXPEN 100 UNIT/ML FlexPen and nystatin-triamcinolone ointment (MYCOLOG)   Pharm:  [facility].  Pt state that he is out of his insulin and would like to have a sample while in the office today.   Pt stating that the pharmacy ([facility] will be closing on 10/25/17 and will be using [facility] after they close.

## 2017-10-23 ENCOUNTER — Other Ambulatory Visit: Payer: Self-pay

## 2017-10-23 MED ORDER — NYSTATIN-TRIAMCINOLONE 100000-0.1 UNIT/GM-% EX OINT: TOPICAL_OINTMENT | Freq: Two times a day (BID) | CUTANEOUS | 0 refills | Status: DC

## 2017-10-23 NOTE — Telephone Encounter (Signed)
Patient notified ointment sent in.

## 2017-10-24 ENCOUNTER — Other Ambulatory Visit: Payer: Self-pay | Admitting: Family Medicine

## 2017-11-03 ENCOUNTER — Other Ambulatory Visit (INDEPENDENT_AMBULATORY_CARE_PROVIDER_SITE_OTHER): Payer: PPO

## 2017-11-03 ENCOUNTER — Other Ambulatory Visit: Payer: Self-pay | Admitting: Internal Medicine

## 2017-11-03 DIAGNOSIS — Z794 Long term (current) use of insulin: Secondary | ICD-10-CM

## 2017-11-03 DIAGNOSIS — E1165 Type 2 diabetes mellitus with hyperglycemia: Secondary | ICD-10-CM

## 2017-11-03 LAB — BASIC METABOLIC PANEL
BUN: 11 mg/dL (ref 6–23)
CO2: 27 mEq/L (ref 19–32)
Calcium: 9.4 mg/dL (ref 8.4–10.5)
Chloride: 102 mEq/L (ref 96–112)
Creatinine, Ser: 0.77 mg/dL (ref 0.40–1.50)
GFR: 127.7 mL/min (ref >60.00)
Glucose, Bld: 143 mg/dL — ABNORMAL HIGH (ref 70–99)
Potassium: 4 mEq/L (ref 3.5–5.1)
Sodium: 139 mEq/L (ref 135–145)

## 2017-11-03 LAB — HEMOGLOBIN A1C: Hgb A1c MFr Bld: 7.1 % — ABNORMAL HIGH (ref 4.6–6.5)

## 2017-11-07 ENCOUNTER — Other Ambulatory Visit: Payer: PPO

## 2017-11-09 ENCOUNTER — Encounter: Payer: Self-pay | Admitting: Endocrinology

## 2017-11-09 ENCOUNTER — Ambulatory Visit (INDEPENDENT_AMBULATORY_CARE_PROVIDER_SITE_OTHER): Payer: PPO | Admitting: Endocrinology

## 2017-11-09 VITALS — BP 140/80 | HR 70 | Ht 71.0 in | Wt 218.0 lb

## 2017-11-09 DIAGNOSIS — Z794 Long term (current) use of insulin: Secondary | ICD-10-CM

## 2017-11-09 DIAGNOSIS — E1165 Type 2 diabetes mellitus with hyperglycemia: Secondary | ICD-10-CM | POA: Diagnosis not present

## 2017-11-09 NOTE — Patient Instructions (Addendum)
Check blood sugars on waking up 3-4/7   Also check blood sugars about 2 hours after a meal and do this after different meals by rotation  Recommended blood sugar levels on waking up is 90-130 and about 2 hours after meal is 130-160  Please bring your blood sugar monitor to each visit, thank you   

## 2017-11-09 NOTE — Progress Notes (Signed)
Patient ID: Patrick Wilcox, male   DOB: 1946-05-16, 72 y.o.   MRN: [id number]    Reason for Appointment: Followup for Type 2 Diabetes  Referring physician: Burnice Logan  History of Present Illness:          Diagnosis: Type 2 diabetes mellitus, date of diagnosis: 2004       Past history:  At diagnosis he was having symptoms of drowsiness, difficulty breathing and blood sugar was reportedly over 600 He was initially treated with insulin but subsequently switched back to oral drugs He may have taken metformin and other medications for some time before going back on insulin His records indicate that he had been taking Amaryl and Januvia before going back on insulin in mid-2014 when his A1c had gone up over 13 Initially was given premixed insulin and then switched to Lantus and Humalog Record of his A1c indicates that his levels previously had been over 8% since 02/2012 He  had much better blood sugar control on his last visit with his changing his lifestyle and being instructed by the nurse educator  His Lantus did not appear to have 24 hour control with once a day injection  Recent history:   INSULIN regimen is described as:  42 units Tresiba daily in p.m., Novolog 22 ac breakfast and 22 supper   A1c has come down further to 7.1   Glucose monitoring, current management and problems identified:  He said that he has started working more consistently on his diet with cutting back on sweets, carbohydrates and regular soft drinks  Although his insulin dose has not changed his blood sugars appear to be significantly better especially fasting which were averaging 170 previously  However despite repeated instruction he does not check his readings after meals and only sporadically after his first meal at 10 AM and only one reading at bedtime  He has variable readings in the mornings up to 168 and not clear why he has variation  He has done some nonspecific exercise but also  starting to do some walking  His weight is about the same  Generally consistent with trying to take his mealtime insulin before eating  Only rarely may feel a little hypoglycemic in the afternoon with blood sugars in the 70s but this is not documented recently  Also he says that he will not take his NOVOLOG if his blood sugar is below about 100  DIET: Has seen the dietitian in 2014 and nurse educator in 8/18       Meals: 2 meals per day 10 am and 6 pm.   Breakfast is  eggs;  toast, eggs, has fruit for snacks.  Evening meal usually vegetables, tuna salad, chicken.         Oral hypoglycemic drugs the patient is taking are: Metformin 1 g twice a day       Compliance with the medical regimen:  Fair Hypoglycemia: None    Glucose monitoring: Checking less than 1 times a day        Glucometer:  One Touch Verio    Blood Glucose readings  from  Download  Mean values apply above for all meters except median for One Touch  PRE-MEAL Fasting Lunch Dinner Bedtime Overall  Glucose range:  88-168   106   Mean/median:  130    125   POST-MEAL PC Breakfast PC Lunch PC Dinner  Glucose range:  98-152    Mean/median:      Previous readings:  Mean  values apply above for all meters except median for One Touch  PRE-MEAL Fasting Lunch Dinner Bedtime Overall  Glucose range: 157-184    147, 167    Mean/median: 170    171   POST-MEAL PC Breakfast PC Lunch PC Dinner  Glucose range: 130-230     Mean/median:          Exercise:  walking 25-30 min  3-4/7  Weight history:  Wt Readings from Last 3 Encounters:  11/09/17 218 lb (98.9 kg)  06/28/17 219 lb 3.2 oz (99.4 kg)  06/08/17 217 lb 3.2 oz (98.5 kg)    Glycemic control:   Lab Results  Component Value Date   HGBA1C 7.1 (H) 11/03/2017   HGBA1C 7.6 (H) 06/05/2017   HGBA1C 8.2 (H) 11/11/2016   Lab Results  Component Value Date   MICROALBUR 26.8 (H) 06/05/2017   LDLCALC 57 06/05/2017   CREATININE 0.77 11/03/2017    Other active  problems: See review of systems   Lab on 11/03/2017  Component Date Value Ref Range Status  . Sodium 11/03/2017 139  135 - 145 mEq/L Final  . Potassium 11/03/2017 4.0  3.5 - 5.1 mEq/L Final  . Chloride 11/03/2017 102  96 - 112 mEq/L Final  . CO2 11/03/2017 27  19 - 32 mEq/L Final  . Glucose, Bld 11/03/2017 143* 70 - 99 mg/dL Final  . BUN 11/03/2017 11  6 - 23 mg/dL Final  . Creatinine, Ser 11/03/2017 0.77  0.40 - 1.50 mg/dL Final  . Calcium 11/03/2017 9.4  8.4 - 10.5 mg/dL Final  . GFR 11/03/2017 127.70  >60.00 mL/min Final  . Hgb A1c MFr Bld 11/03/2017 7.1* 4.6 - 6.5 % Final   Glycemic Control Guidelines for People with Diabetes:Non Diabetic:  <6%Goal of Therapy: <7%Additional Action Suggested:  >8%       Allergies as of 11/09/2017   No Known Allergies     Medication List        Accurate as of 11/09/17  9:19 PM. Always use your most recent med list.          allopurinol 300 MG tablet Commonly known as:  ZYLOPRIM take 1 tablet by mouth once daily   amLODipine 10 MG tablet Commonly known as:  NORVASC Take 1 tablet (10 mg total) by mouth daily.   aspirin 81 MG tablet Take 81 mg by mouth daily.   atorvastatin 40 MG tablet Commonly known as:  LIPITOR Take 1 tablet (40 mg total) by mouth daily.   B-D SINGLE USE SWABS REGULAR Pads Use as necessary   colchicine 0.6 MG tablet Commonly known as:  COLCRYS take 1 tablet by mouth once daily   EASY COMFORT PEN NEEDLES 31G X 5 MM Misc Generic drug:  Insulin Pen Needle use as directed THREE TIMES DAILY with lantus and novolog doses   furosemide 20 MG tablet Commonly known as:  LASIX TAKE 1 TABLET BY MOUTH DAILY   glucose blood test strip Commonly known as:  ONETOUCH VERIO Use as instructed   insulin degludec 100 UNIT/ML Sopn FlexTouch Pen Commonly known as:  TRESIBA FLEXTOUCH 40 units daily with supper, If am sugars staying < 90 in am reduce tresiba to 38 units   metFORMIN 1000 MG tablet Commonly known as:   GLUCOPHAGE TAKE ONE TABLET BY MOUTH TWICE DAILY. TAKE WITH A MEAL.   metoprolol tartrate 100 MG tablet Commonly known as:  LOPRESSOR Take 1 tablet (100 mg total) by mouth 2 (two) times daily.  NOVOLOG FLEXPEN 100 UNIT/ML FlexPen Generic drug:  insulin aspart INJECT 20-22 UNITS INTO SKIN PRIOR TO BREAKFAST AND 20-22 UNITS PRIOR TO EVENING MEAL *1500/44=35*   nystatin-triamcinolone ointment Commonly known as:  MYCOLOG Apply topically 2 (two) times daily.   ONETOUCH DELICA LANCETS 12Y Misc Use to check blood sugar twice daily. DX Code E11.9   ONETOUCH VERIO IQ SYSTEM w/Device Kit USE TO CHECK BLOOD SUGAR TWICE A DAY DX CODE E11.9   ramipril 10 MG capsule Commonly known as:  ALTACE Take 1 capsule (10 mg total) by mouth daily.   ramipril 10 MG capsule Commonly known as:  ALTACE TAKE ONE CAPSULE BY MOUTH ONCE DAILY.   sildenafil 50 MG tablet Commonly known as:  VIAGRA Take 1 tablet (50 mg total) by mouth daily as needed for erectile dysfunction.       Allergies:  No Known Allergies  Past Medical History:  Diagnosis Date  . CORONARY ARTERY DISEASE 11/23/2006  . DIABETES MELLITUS, TYPE II 11/23/2006  . GOUT 05/20/2008  . HYPERLIPIDEMIA 11/23/2006  . HYPERTENSION 11/23/2006  . 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 PNEUMONIA, LEFT LOWER LOBE 09/08/2009    Past Surgical History:  Procedure Laterality Date  . CORONARY ANGIOPLASTY WITH STENT PLACEMENT    . CORONARY ARTERY BYPASS GRAFT  2003    Family History  Problem Relation Age of Onset  . Heart disease Mother   . Heart disease Father   . Diabetes Sister   . Diabetes Brother     Social History:  reports that he has never smoked. He has never used smokeless tobacco. He reports that he does not drink alcohol or use drugs.    Review of Systems        Lipids:  LDL Adequately treated with Lipitor40 mg Has history of CAD, followed by PCP       Lab Results  Component Value Date   CHOL 101 06/05/2017   HDL  28.80 (L) 06/05/2017   LDLCALC 57 06/05/2017   TRIG 79.0 06/05/2017   CHOLHDL 4 06/05/2017       The blood pressure has been treated with Norvasc10 mg, ramipril and metoprolol, Followed by PCP Tends to have mild white coat syndrome, blood pressure   BP Readings from Last 3 Encounters:  11/09/17 140/80  06/28/17 (!) 156/70  48/25/00 370/48     Complications: No evidence of neuropathy or nephropathy  Diabetic foot exam was done in 3/18        LABS:  Lab on 11/03/2017  Component Date Value Ref Range Status  . Sodium 11/03/2017 139  135 - 145 mEq/L Final  . Potassium 11/03/2017 4.0  3.5 - 5.1 mEq/L Final  . Chloride 11/03/2017 102  96 - 112 mEq/L Final  . CO2 11/03/2017 27  19 - 32 mEq/L Final  . Glucose, Bld 11/03/2017 143* 70 - 99 mg/dL Final  . BUN 11/03/2017 11  6 - 23 mg/dL Final  . Creatinine, Ser 11/03/2017 0.77  0.40 - 1.50 mg/dL Final  . Calcium 11/03/2017 9.4  8.4 - 10.5 mg/dL Final  . GFR 11/03/2017 127.70  >60.00 mL/min Final  . Hgb A1c MFr Bld 11/03/2017 7.1* 4.6 - 6.5 % Final   Glycemic Control Guidelines for People with Diabetes:Non Diabetic:  <6%Goal of Therapy: <7%Additional Action Suggested:  >8%     Physical Examination:  BP 140/80 (BP Location: Left Arm, Patient Position: Sitting, Cuff Size: Large)   Pulse 70   Ht _0  (1.803 m)  Wt 218 lb (98.9 kg)   SpO2 98%   BMI 30.40 kg/m        ASSESSMENT/PLAN  Diabetes type 2, On insulin   See history of present illness for detailed discussion of current management, problems identified and blood sugar patterns.  A1c 7.1 which is significantly better  He is benefiting from improved diet which is again more consistent He is having somewhat variable fasting readings probably related to his compliance with diet the night before He is not getting blood sugars checked after meals and discussed importance of this especially if he is trying to be more active lately Also discussed the importance of taking  at least half the insulin dose at mealtimes even if blood sugar before breakfast is below 100 No change in insulin regimen at this time Discussed trying to check blood sugars at bedtime when he needs to remind himself to do this at least every other day    Patient Instructions  Check blood sugars on waking up 3-4/7   Also check blood sugars about 2 hours after a meal and do this after different meals by rotation  Recommended blood sugar levels on waking up is 90-130 and about 2 hours after meal is 130-160  Please bring your blood sugar monitor to each visit, thank you          [name] 11/09/2017, 9:19 PM   Note: This office note was prepared with Dragon voice recognition system technology. Any transcriptional errors that result from this process are unintentional.

## 2017-12-11 ENCOUNTER — Other Ambulatory Visit: Payer: Self-pay | Admitting: Internal Medicine

## 2017-12-26 ENCOUNTER — Other Ambulatory Visit: Payer: Self-pay | Admitting: Internal Medicine

## 2017-12-26 NOTE — Telephone Encounter (Signed)
Need alL medications sent to [facility]/out of metform hcl 1000 mg,metoprol tartrate 100mg  tab and allpourinol 300mg  tablet

## 2018-01-01 ENCOUNTER — Encounter: Payer: Self-pay | Admitting: Internal Medicine

## 2018-01-01 ENCOUNTER — Ambulatory Visit (INDEPENDENT_AMBULATORY_CARE_PROVIDER_SITE_OTHER): Payer: PPO | Admitting: Internal Medicine

## 2018-01-01 VITALS — BP 170/88 | HR 61 | Temp 97.8°F | Ht 71.0 in | Wt 218.0 lb

## 2018-01-01 DIAGNOSIS — I2510 Atherosclerotic heart disease of native coronary artery without angina pectoris: Secondary | ICD-10-CM | POA: Diagnosis not present

## 2018-01-01 DIAGNOSIS — E1159 Type 2 diabetes mellitus with other circulatory complications: Secondary | ICD-10-CM | POA: Diagnosis not present

## 2018-01-01 DIAGNOSIS — Z0000 Encounter for general adult medical examination without abnormal findings: Secondary | ICD-10-CM | POA: Diagnosis not present

## 2018-01-01 DIAGNOSIS — E7800 Pure hypercholesterolemia, unspecified: Secondary | ICD-10-CM

## 2018-01-01 DIAGNOSIS — I10 Essential (primary) hypertension: Secondary | ICD-10-CM | POA: Diagnosis not present

## 2018-01-01 DIAGNOSIS — I152 Hypertension secondary to endocrine disorders: Secondary | ICD-10-CM

## 2018-01-01 NOTE — Patient Instructions (Signed)
Limit your sodium (Salt) intake  Please see your eye doctor yearly to check for diabetic eye damage    It is important that you exercise regularly, at least 20 minutes 3 to 4 times per week.  If you develop chest pain or shortness of breath seek  medical attention.   Please check your hemoglobin A1c every 3 months  Return in 6 months for follow-up to establish with a new provider  Endocrinology follow-up as scheduled

## 2018-01-01 NOTE — Progress Notes (Signed)
Subjective:    Patient ID: Patrick Wilcox, male    DOB: [birth year], 72 y.o.   MRN: [id number]  HPI  72 year old patient who is seen today for a annual exam and subsequent Medicare wellness visit He has a history of diabetes and is followed by endocrinology. He has coronary artery disease and is status post CABG in 2003.  Remains on statin therapy for dyslipidemia.  He has a history of gout that has been stable. His diabetes has been fairly stable on basal bolus insulin therapy. He is scheduled for cataract extraction surgery in a couple of months.  Last colonoscopy approximately 9 years ago  Past Medical History:  Diagnosis Date  . CORONARY ARTERY DISEASE 11/23/2006  . DIABETES MELLITUS, TYPE II 11/23/2006  . GOUT 05/20/2008  . HYPERLIPIDEMIA 11/23/2006  . HYPERTENSION 11/23/2006  . 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 PNEUMONIA, LEFT LOWER LOBE 09/08/2009     Social History   Socioeconomic History  . Marital status: Single    Spouse name: Not on file  . Number of children: Not on file  . Years of education: Not on file  . Highest education level: Not on file  Occupational History  . Not on file  Social Needs  . Financial resource strain: Not on file  . Food insecurity:    Worry: Not on file    Inability: Not on file  . Transportation needs:    Medical: Not on file    Non-medical: Not on file  Tobacco Use  . Smoking status: Never Smoker  . Smokeless tobacco: Never Used  Substance and Sexual Activity  . Alcohol use: No  . Drug use: No  . Sexual activity: Not on file  Lifestyle  . Physical activity:    Days per week: Not on file    Minutes per session: Not on file  . Stress: Not on file  Relationships  . Social connections:    Talks on phone: Not on file    Gets together: Not on file    Attends religious service: Not on file    Active member of club or organization: Not on file    Attends meetings of clubs or organizations: Not on file    Relationship  status: Not on file  . Intimate partner violence:    Fear of current or ex partner: Not on file    Emotionally abused: Not on file    Physically abused: Not on file    Forced sexual activity: Not on file  Other Topics Concern  . Not on file  Social History Narrative  . Not on file    Past Surgical History:  Procedure Laterality Date  . CORONARY ANGIOPLASTY WITH STENT PLACEMENT    . CORONARY ARTERY BYPASS GRAFT  2003    Family History  Problem Relation Age of Onset  . Heart disease Mother   . Heart disease Father   . Diabetes Sister   . Diabetes Brother     No Known Allergies  Current Outpatient Medications on File Prior to Visit  Medication Sig Dispense Refill  . Alcohol Swabs (B-D SINGLE USE SWABS REGULAR) PADS Use as necessary 300 each 6  . allopurinol (ZYLOPRIM) 300 MG tablet TAKE 1 TABLET BY MOUTH ONCE DAILY 90 tablet 1  . amLODipine (NORVASC) 10 MG tablet Take 1 tablet (10 mg total) by mouth daily. 90 tablet 1  . aspirin 81 MG tablet Take 81 mg by mouth daily.    Marland Kitchen atorvastatin (  LIPITOR) 40 MG tablet Take 1 tablet (40 mg total) by mouth daily. 90 tablet 1  . Blood Glucose Monitoring Suppl (ONETOUCH VERIO IQ SYSTEM) w/Device KIT USE TO CHECK BLOOD SUGAR TWICE A DAY DX CODE E11.9 1 kit 2  . colchicine (COLCRYS) 0.6 MG tablet take 1 tablet by mouth once daily 90 tablet 3  . EASY COMFORT PEN NEEDLES 31G X 5 MM MISC use as directed THREE TIMES DAILY with lantus and novolog doses 100 each 3  . furosemide (LASIX) 20 MG tablet TAKE 1 TABLET BY MOUTH DAILY 90 tablet 1  . glucose blood (ONETOUCH VERIO) test strip Use as instructed 200 each 1  . insulin degludec (TRESIBA FLEXTOUCH) 100 UNIT/ML SOPN FlexTouch Pen 40 units daily with supper, If am sugars staying < 90 in am reduce tresiba to 38 units 15 mL 3  . metFORMIN (GLUCOPHAGE) 1000 MG tablet TAKE ONE TABLET BY MOUTH TWICE DAILY. TAKE WITH A MEAL. 180 tablet 1  . metoprolol tartrate (LOPRESSOR) 100 MG tablet TAKE 1 TABLET (100  MG TOTAL) BY MOUTH 2 (TWO) TIMES DAILY. 180 tablet 1  . NOVOLOG FLEXPEN 100 UNIT/ML FlexPen INJECT 20-22 UNITS INTO SKIN PRIOR TO BREAKFAST AND 20-22 UNITS PRIOR TO EVENING MEAL *1500/44=35* 15 pen 0  . nystatin-triamcinolone ointment (MYCOLOG) Apply topically 2 (two) times daily. 60 g 0  . ONETOUCH DELICA LANCETS 95M MISC Use to check blood sugar twice daily. DX Code E11.9 200 each 1  . ramipril (ALTACE) 10 MG capsule Take 1 capsule (10 mg total) by mouth daily. 90 capsule 2  . sildenafil (VIAGRA) 50 MG tablet Take 1 tablet (50 mg total) by mouth daily as needed for erectile dysfunction. 10 tablet 6   No current facility-administered medications on file prior to visit.     BP (!) 170/88 (BP Location: Right Arm, Patient Position: Sitting, Cuff Size: Normal)   Pulse 61   Temp 97.8 F (36.6 C) (Oral)   Ht '5\' 11"'  (1.803 m)   Wt 218 lb (98.9 kg)   SpO2 98%   BMI 30.40 kg/m   Subsequent Medicare wellness visit  1. Risk factors, based on past  M,S,F history.  Patient has known coronary artery disease.  He is status post CABG.  Cardiovascular risk factors include diabetes and dyslipidemia as well as hypertension  2.  Physical activities: Remains quite active without exercise limitations.  He recently went to a [employer] baseball game in [place] and did well walking several miles  3.  Depression/mood: No history major depression or mood disorder  4.  Hearing: No deficits  5.  ADL's: Independent  6.  Fall risk: Low  7.  Home safety: No problems identified  8.  Height weight, and visual acuity; height and weight stable no change in visual acuity is followed by ophthalmology and is scheduled for cataract extraction surgery in 2 months  9.  Counseling: Continue heart healthy diet and active lifestyle  10. Lab orders based on risk factors: Laboratory studies were reviewed patient had hemoglobin A1c 2 months ago urine for microalbumin and lipid profile within the past year  11.  Referral : Follow-up endocrinology  12. Care plan: Continue efforts at aggressive risk factor modification  13. Cognitive assessment: Alert and oriented with normal affect.  No cognitive dysfunction  14. Screening: Patient provided with a written and personalized 5-10 year screening schedule in the AVS. will need colonoscopy in 1 year  15. Provider List Update: Primary care ophthalmology and endocrinology  Review of Systems  Constitutional: Negative for appetite change, chills, fatigue and fever.  HENT: Negative for congestion, dental problem, ear pain, hearing loss, sore throat, tinnitus, trouble swallowing and voice change.   Eyes: Negative for pain, discharge and visual disturbance.  Respiratory: Negative for cough, chest tightness, wheezing and stridor.   Cardiovascular: Negative for chest pain, palpitations and leg swelling.  Gastrointestinal: Negative for abdominal distention, abdominal pain, blood in stool, constipation, diarrhea, nausea and vomiting.  Genitourinary: Negative for difficulty urinating, discharge, flank pain, genital sores, hematuria and urgency.  Musculoskeletal: Negative for arthralgias, back pain, gait problem, joint swelling, myalgias and neck stiffness.  Skin: Negative for rash.  Neurological: Negative for dizziness, syncope, speech difficulty, weakness, numbness and headaches.  Hematological: Negative for adenopathy. Does not bruise/bleed easily.  Psychiatric/Behavioral: Negative for behavioral problems and dysphoric mood. The patient is not nervous/anxious.        Objective:   Physical Exam  Constitutional: He appears well-developed and well-nourished.  Repeat blood pressure much better controlled at 130/78  HENT:  Head: Normocephalic and atraumatic.  Right Ear: External ear normal.  Left Ear: External ear normal.  Nose: Nose normal.  Mouth/Throat: Oropharynx is clear and moist.  Eyes: Pupils are equal, round, and reactive to light. Conjunctivae  and EOM are normal. No scleral icterus.  Neck: Normal range of motion. Neck supple. No JVD present. No thyromegaly present.  Cardiovascular: Regular rhythm, normal heart sounds and intact distal pulses. Exam reveals no gallop and no friction rub.  No murmur heard. Status post sternotomy scar and vein stripping left medial leg  Grade 3/6 systolic murmur  Dorsalis pedis pulses +2.  Posterior tibial pulses not easily palpable  Pulmonary/Chest: Effort normal and breath sounds normal. He exhibits no tenderness.  Abdominal: Soft. Bowel sounds are normal. He exhibits no distension and no mass. There is no tenderness.  Genitourinary: Prostate normal and penis normal. Rectal exam shows guaiac negative stool.  Musculoskeletal: Normal range of motion. He exhibits no edema or tenderness.  Lymphadenopathy:    He has no cervical adenopathy.  Neurological: He is alert. He has normal reflexes. No cranial nerve deficit. Coordination normal.  Skin: Skin is warm and dry. No rash noted.  3 cm benign pedunculated lesion right mid back   Psychiatric: He has a normal mood and affect. His behavior is normal.          Assessment & Plan:   Preventive health examination Subsequent Medicare wellness visit Coronary artery disease.  Clinically stable.  Patient has had myocardial perfusion study in 2017 Essential hypertension stable Dyslipidemia continue statin therapy Diabetes mellitus.  Follow-up endocrinology  Patient will follow-up with a new provider in 6 months No change in medical regimen  Marletta Lor

## 2018-01-05 ENCOUNTER — Other Ambulatory Visit: Payer: Self-pay | Admitting: Internal Medicine

## 2018-01-12 ENCOUNTER — Other Ambulatory Visit: Payer: Self-pay

## 2018-01-12 MED ORDER — INSULIN ASPART 100 UNIT/ML FLEXPEN: PEN_INJECTOR | SUBCUTANEOUS | 0 refills | Status: DC

## 2018-01-17 ENCOUNTER — Other Ambulatory Visit: Payer: Self-pay | Admitting: Internal Medicine

## 2018-01-19 ENCOUNTER — Other Ambulatory Visit: Payer: Self-pay | Admitting: Internal Medicine

## 2018-02-09 ENCOUNTER — Other Ambulatory Visit: Payer: Self-pay

## 2018-02-09 MED ORDER — EASY COMFORT PEN NEEDLES 31G X 5 MM MISC: 3 refills | Status: DC

## 2018-02-13 ENCOUNTER — Other Ambulatory Visit: Payer: Self-pay | Admitting: Internal Medicine

## 2018-02-15 DIAGNOSIS — H2511 Age-related nuclear cataract, right eye: Secondary | ICD-10-CM | POA: Diagnosis not present

## 2018-02-15 DIAGNOSIS — H25011 Cortical age-related cataract, right eye: Secondary | ICD-10-CM | POA: Diagnosis not present

## 2018-02-15 DIAGNOSIS — H25811 Combined forms of age-related cataract, right eye: Secondary | ICD-10-CM | POA: Diagnosis not present

## 2018-03-06 ENCOUNTER — Other Ambulatory Visit: Payer: Self-pay | Admitting: Internal Medicine

## 2018-03-13 ENCOUNTER — Other Ambulatory Visit: Payer: PPO

## 2018-03-13 ENCOUNTER — Ambulatory Visit: Payer: PPO | Admitting: Adult Health

## 2018-03-13 DIAGNOSIS — Z0289 Encounter for other administrative examinations: Secondary | ICD-10-CM

## 2018-03-13 NOTE — Progress Notes (Deleted)
Patient presents to clinic today to establish care. He is a pleasant 72 year old male who  has a past medical history of CORONARY ARTERY DISEASE (11/23/2006), DIABETES MELLITUS, TYPE II (11/23/2006), GOUT (05/20/2008), HYPERLIPIDEMIA (11/23/2006), HYPERTENSION (11/23/2006), MYOCARDIAL INFARCTION, HX OF (11/23/2006), and PNEUMONIA, LEFT LOWER LOBE (09/08/2009).  He is a former patient of Dr. Raliegh Ip who is transferring care to me.   His last CPE was in June 2019   Acute Concerns: Establish Care   Chronic Issues: DM - Is followed by Endocrinology. Currently prescribed Metformin 1000 mg BID. INSULIN regimen is: 42 units Tresiba daily in p.m., Novolog 22 ac breakfast and 22 supper  Lab Results  Component Value Date   HGBA1C 7.1 (H) 11/03/2017    CAD s/p CABG in 2003 - continues to be on ASA and Statin   Hyperlipidemia - ASA and statin  Lab Results  Component Value Date   CHOL 101 06/05/2017   HDL 28.80 (L) 06/05/2017   LDLCALC 57 06/05/2017   TRIG 79.0 06/05/2017   CHOLHDL 4 06/05/2017    Hypertension- Currently prescribed Norvac 10 mg, Metoprolol 100 mg BID, and Altace 10 mg  BP Readings from Last 3 Encounters:  01/01/18 (!) 170/88  11/09/17 140/80  06/28/17 (!) 156/70    Gout - well controlled on Allopurinol 300 mg   Health Maintenance: Dental -- Vision -- Routine  Immunizations -- Due for seasonal flu; all others UTD  Colonoscopy -- 2010  Diet:  Exercise: Stays quite active.    Past Medical History:  Diagnosis Date  . CORONARY ARTERY DISEASE 11/23/2006  . DIABETES MELLITUS, TYPE II 11/23/2006  . GOUT 05/20/2008  . HYPERLIPIDEMIA 11/23/2006  . HYPERTENSION 11/23/2006  . 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 PNEUMONIA, LEFT LOWER LOBE 09/08/2009    Past Surgical History:  Procedure Laterality Date  . CORONARY ANGIOPLASTY WITH STENT PLACEMENT    . CORONARY ARTERY BYPASS GRAFT  2003    Current Outpatient Medications on File Prior to Visit  Medication Sig Dispense  Refill  . Alcohol Swabs (B-D SINGLE USE SWABS REGULAR) PADS Use as necessary 300 each 6  . allopurinol (ZYLOPRIM) 300 MG tablet TAKE 1 TABLET BY MOUTH ONCE DAILY 90 tablet 1  . amLODipine (NORVASC) 10 MG tablet Take 1 tablet (10 mg total) by mouth daily. 90 tablet 1  . aspirin 81 MG tablet Take 81 mg by mouth daily.    Marland Kitchen atorvastatin (LIPITOR) 40 MG tablet Take 1 tablet (40 mg total) by mouth daily. 90 tablet 1  . Blood Glucose Monitoring Suppl (ONETOUCH VERIO IQ SYSTEM) w/Device KIT USE TO CHECK BLOOD SUGAR TWICE A DAY DX CODE E11.9 1 kit 2  . colchicine (COLCRYS) 0.6 MG tablet take 1 tablet by mouth once daily 90 tablet 3  . EASY COMFORT PEN NEEDLES 31G X 5 MM MISC Used to check  Blood glucose 3 times daily or prn. 100 each 3  . furosemide (LASIX) 20 MG tablet TAKE 1 TABLET BY MOUTH DAILY 90 tablet 1  . glucose blood (ONETOUCH VERIO) test strip Use as instructed 200 each 1  . metFORMIN (GLUCOPHAGE) 1000 MG tablet TAKE ONE TABLET BY MOUTH TWICE DAILY. TAKE WITH A MEAL. 180 tablet 1  . metoprolol tartrate (LOPRESSOR) 100 MG tablet TAKE 1 TABLET (100 MG TOTAL) BY MOUTH 2 (TWO) TIMES DAILY. 180 tablet 1  . NOVOLOG FLEXPEN 100 UNIT/ML FlexPen INJECT 20-22 UNITS INTO SKIN PRIOR TO BREAKFAST AND 20-22 UNITS PRIOR TO EVENING MEAL *  1500/44=35* 15 pen 0  . nystatin-triamcinolone ointment (MYCOLOG) Apply topically 2 (two) times daily. 60 g 0  . ONETOUCH DELICA LANCETS 30Z MISC Use to check blood sugar twice daily. DX Code E11.9 200 each 1  . ramipril (ALTACE) 10 MG capsule Take 1 capsule (10 mg total) by mouth daily. 90 capsule 2  . sildenafil (VIAGRA) 50 MG tablet Take 1 tablet (50 mg total) by mouth daily as needed for erectile dysfunction. 10 tablet 6  . TRESIBA FLEXTOUCH 100 UNIT/ML SOPN FlexTouch Pen 40 UNITS DAILY WITH SUPPER, IF AM SUGARS STAYING < 90 IN AM REDUCE TRESIBA TO 38 UNITS 1500/40=38 15 pen 1   No current facility-administered medications on file prior to visit.     No Known  Allergies  Family History  Problem Relation Age of Onset  . Heart disease Mother   . Heart disease Father   . Diabetes Sister   . Diabetes Brother     Social History   Socioeconomic History  . Marital status: Single    Spouse name: Not on file  . Number of children: Not on file  . Years of education: Not on file  . Highest education level: Not on file  Occupational History  . Not on file  Social Needs  . Financial resource strain: Not on file  . Food insecurity:    Worry: Not on file    Inability: Not on file  . Transportation needs:    Medical: Not on file    Non-medical: Not on file  Tobacco Use  . Smoking status: Never Smoker  . Smokeless tobacco: Never Used  Substance and Sexual Activity  . Alcohol use: No  . Drug use: No  . Sexual activity: Not on file  Lifestyle  . Physical activity:    Days per week: Not on file    Minutes per session: Not on file  . Stress: Not on file  Relationships  . Social connections:    Talks on phone: Not on file    Gets together: Not on file    Attends religious service: Not on file    Active member of club or organization: Not on file    Attends meetings of clubs or organizations: Not on file    Relationship status: Not on file  . Intimate partner violence:    Fear of current or ex partner: Not on file    Emotionally abused: Not on file    Physically abused: Not on file    Forced sexual activity: Not on file  Other Topics Concern  . Not on file  Social History Narrative  . Not on file    Review of Systems  Constitutional: Negative.   Respiratory: Negative.   Cardiovascular: Negative.   Gastrointestinal: Negative.   Genitourinary: Negative.   Musculoskeletal: Negative.   Neurological: Negative.   Psychiatric/Behavioral: Negative.     There were no vitals taken for this visit.  Physical Exam  Constitutional: He is oriented to person, place, and time. He appears well-developed and well-nourished. No distress.   Eyes: Pupils are equal, round, and reactive to light. Conjunctivae and EOM are normal.  Neck: Normal range of motion. Neck supple.  Cardiovascular: Normal rate, regular rhythm and intact distal pulses. Exam reveals no gallop and no friction rub.  Murmur heard. Pulmonary/Chest: Effort normal and breath sounds normal. No stridor. No respiratory distress. He has no wheezes. He has no rales. He exhibits no tenderness.  Musculoskeletal: Normal range of motion. He exhibits  no edema, tenderness or deformity.  Neurological: He is alert and oriented to person, place, and time.  Skin: Skin is warm and dry. He is not diaphoretic.  Psychiatric: He has a normal mood and affect. His behavior is normal. Judgment and thought content normal.  Nursing note and vitals reviewed.    Assessment/Plan: No problem-specific Assessment & Plan notes found for this encounter.

## 2018-03-15 ENCOUNTER — Other Ambulatory Visit: Payer: Self-pay | Admitting: Internal Medicine

## 2018-03-16 ENCOUNTER — Ambulatory Visit (INDEPENDENT_AMBULATORY_CARE_PROVIDER_SITE_OTHER): Payer: PPO | Admitting: Endocrinology

## 2018-03-16 ENCOUNTER — Encounter: Payer: Self-pay | Admitting: Endocrinology

## 2018-03-16 VITALS — BP 134/86 | HR 64 | Ht 72.0 in | Wt 219.0 lb

## 2018-03-16 DIAGNOSIS — E1165 Type 2 diabetes mellitus with hyperglycemia: Secondary | ICD-10-CM

## 2018-03-16 DIAGNOSIS — Z794 Long term (current) use of insulin: Secondary | ICD-10-CM | POA: Diagnosis not present

## 2018-03-16 DIAGNOSIS — E782 Mixed hyperlipidemia: Secondary | ICD-10-CM

## 2018-03-16 LAB — COMPREHENSIVE METABOLIC PANEL
ALT: 24 U/L (ref 0–53)
AST: 26 U/L (ref 0–37)
Albumin: 4.3 g/dL (ref 3.5–5.2)
Alkaline Phosphatase: 103 U/L (ref 39–117)
BUN: 10 mg/dL (ref 6–23)
CO2: 31 mEq/L (ref 19–32)
Calcium: 9.6 mg/dL (ref 8.4–10.5)
Chloride: 101 mEq/L (ref 96–112)
Creatinine, Ser: 0.72 mg/dL (ref 0.40–1.50)
GFR: 137.84 mL/min (ref >60.00)
Glucose, Bld: 163 mg/dL — ABNORMAL HIGH (ref 70–99)
Potassium: 4.2 mEq/L (ref 3.5–5.1)
Sodium: 139 mEq/L (ref 135–145)
Total Bilirubin: 0.7 mg/dL (ref 0.2–1.2)
Total Protein: 7.8 g/dL (ref 6.0–8.3)

## 2018-03-16 LAB — GLUCOSE, POCT (MANUAL RESULT ENTRY): POC Glucose: 169 mg/dl — AB (ref 70–99)

## 2018-03-16 LAB — MICROALBUMIN / CREATININE URINE RATIO
Creatinine,U: 51.5 mg/dL
Microalb Creat Ratio: 13.1 mg/g (ref 0.0–30.0)
Microalb, Ur: 6.8 mg/dL — ABNORMAL HIGH (ref 0.0–1.9)

## 2018-03-16 LAB — LIPID PANEL
Cholesterol: 111 mg/dL (ref 0–200)
HDL: 30.4 mg/dL — ABNORMAL LOW (ref >39.00)
LDL Cholesterol: 60 mg/dL (ref 0–99)
NonHDL: 80.17
Total CHOL/HDL Ratio: 4
Triglycerides: 100 mg/dL (ref 0.0–149.0)
VLDL: 20 mg/dL (ref 0.0–40.0)

## 2018-03-16 NOTE — Progress Notes (Signed)
Patient ID: Patrick Wilcox, male   DOB: 1946-06-06, 72 y.o.   MRN: [id number]    Reason for Appointment: Followup for Type 2 Diabetes  Referring physician: Burnice Logan  History of Present Illness:          Diagnosis: Type 2 diabetes mellitus, date of diagnosis: 2004       Past history:  At diagnosis he was having symptoms of drowsiness, difficulty breathing and blood sugar was reportedly over 600 He was initially treated with insulin but subsequently switched back to oral drugs He may have taken metformin and other medications for some time before going back on insulin His records indicate that he had been taking Amaryl and Januvia before going back on insulin in mid-2014 when his A1c had gone up over 13 Initially was given premixed insulin and then switched to Lantus and Humalog Record of his A1c indicates that his levels previously had been over 8% since 02/2012 He  had much better blood sugar control on his last visit with his changing his lifestyle and being instructed by the nurse educator  His Lantus did not appear to have 24 hour control with once a day injection  Recent history:   INSULIN regimen is described as:  40 units Tresiba daily in p.m., Novolog 10-20 Ac breakfast and 22 supper   His A1c is relatively higher at 7.3 compared to 7.1  Glucose monitoring, current management and problems identified:  He recently does not check his sugars after meals as directed and only in the mornings  Also not clear if some of his readings are after breakfast but the time of his glucose monitoring is anywhere between 7 AM and 12 noon  He says that he is taking his morning insulin dosage of NovoLog based on how much his sugars in the morning and he will not take any if the blood sugar is normal  Today his lab glucose was 169 compared to fasting of 97 with not taking any insulin for breakfast  He has had only one episode of hypoglycemia waking up about 3 weeks ago but no  other low sugars  Although previously had done better with watching his diet and losing weight his overall control is not as good with A1c going up  He does try to walk fairly regularly  DIET: Has seen the dietitian in 2014 and nurse educator in 8/18       Meals: 2 meals per day 10 am and 9 pm.   Breakfast is  eggs;  toast, eggs, has fruit for snacks.  Evening meal usually vegetables, tuna salad, chicken.         Oral hypoglycemic drugs the patient is taking are: Metformin 1 g twice a day       Compliance with the medical regimen:  Fair Hypoglycemia: None    Glucose monitoring: Checking less than 1 times a day        Glucometer:  One Touch Verio    Blood Glucose readings  from  Download show blood sugar range 60 up to 156 with median 113, all readings before noon    Exercise:  walking 25-30 min  3-4/7  Weight history:  Wt Readings from Last 3 Encounters:  03/16/18 219 lb (99.3 kg)  01/01/18 218 lb (98.9 kg)  11/09/17 218 lb (98.9 kg)    Glycemic control:   Lab Results  Component Value Date   HGBA1C 7.3 (A) 03/19/2018   HGBA1C 7.1 (H) 11/03/2017   HGBA1C  7.6 (H) 06/05/2017   Lab Results  Component Value Date   MICROALBUR 6.8 (H) 03/16/2018   LDLCALC 60 03/16/2018   CREATININE 0.72 03/16/2018    Other active problems: See review of systems   Office Visit on 03/16/2018  Component Date Value Ref Range Status  . Hemoglobin A1C 03/19/2018 7.3* 4.0 - 5.6 % Final  . POC Glucose 03/16/2018 169* 70 - 99 mg/dl Final  . Cholesterol 03/16/2018 111  0 - 200 mg/dL Final   ATP III Classification       Desirable:  < 200 mg/dL               Borderline High:  200 - 239 mg/dL          High:  > = 240 mg/dL  . Triglycerides 03/16/2018 100.0  0.0 - 149.0 mg/dL Final   Normal:  <150 mg/dLBorderline High:  150 - 199 mg/dL  . HDL 03/16/2018 30.40* >39.00 mg/dL Final  . VLDL 03/16/2018 20.0  0.0 - 40.0 mg/dL Final  . LDL Cholesterol 03/16/2018 60  0 - 99 mg/dL Final  . Total  CHOL/HDL Ratio 03/16/2018 4   Final                  Men          Women1/2 Average Risk     3.4          3.3Average Risk          5.0          4.42X Average Risk          9.6          7.13X Average Risk          15.0          11.0                      . NonHDL 03/16/2018 80.17   Final   NOTE:  Non-HDL goal should be 30 mg/dL higher than patient's LDL goal (i.e. LDL goal of < 70 mg/dL, would have non-HDL goal of < 100 mg/dL)  . Microalb, Ur 03/16/2018 6.8* 0.0 - 1.9 mg/dL Final  . Creatinine,U 03/16/2018 51.5  mg/dL Final  . Microalb Creat Ratio 03/16/2018 13.1  0.0 - 30.0 mg/g Final  . Sodium 03/16/2018 139  135 - 145 mEq/L Final  . Potassium 03/16/2018 4.2  3.5 - 5.1 mEq/L Final  . Chloride 03/16/2018 101  96 - 112 mEq/L Final  . CO2 03/16/2018 31  19 - 32 mEq/L Final  . Glucose, Bld 03/16/2018 163* 70 - 99 mg/dL Final  . BUN 03/16/2018 10  6 - 23 mg/dL Final  . Creatinine, Ser 03/16/2018 0.72  0.40 - 1.50 mg/dL Final  . Total Bilirubin 03/16/2018 0.7  0.2 - 1.2 mg/dL Final  . Alkaline Phosphatase 03/16/2018 103  39 - 117 U/L Final  . AST 03/16/2018 26  0 - 37 U/L Final  . ALT 03/16/2018 24  0 - 53 U/L Final  . Total Protein 03/16/2018 7.8  6.0 - 8.3 g/dL Final  . Albumin 03/16/2018 4.3  3.5 - 5.2 g/dL Final  . Calcium 03/16/2018 9.6  8.4 - 10.5 mg/dL Final  . GFR 03/16/2018 137.84  >60.00 mL/min Final      Allergies as of 03/16/2018   No Known Allergies     Medication List        Accurate as of 03/16/18 11:59 PM.  Always use your most recent med list.          allopurinol 300 MG tablet Commonly known as:  ZYLOPRIM TAKE 1 TABLET BY MOUTH ONCE DAILY   amLODipine 10 MG tablet Commonly known as:  NORVASC Take 1 tablet (10 mg total) by mouth daily.   aspirin 81 MG tablet Take 81 mg by mouth daily.   atorvastatin 40 MG tablet Commonly known as:  LIPITOR Take 1 tablet (40 mg total) by mouth daily.   B-D SINGLE USE SWABS REGULAR Pads Use as necessary   colchicine 0.6 MG  tablet take 1 tablet by mouth once daily   EASY COMFORT PEN NEEDLES 31G X 5 MM Misc Generic drug:  Insulin Pen Needle Used to check  Blood glucose 3 times daily or prn.   furosemide 20 MG tablet Commonly known as:  LASIX TAKE 1 TABLET BY MOUTH DAILY   glucose blood test strip Use as instructed   metFORMIN 1000 MG tablet Commonly known as:  GLUCOPHAGE TAKE ONE TABLET BY MOUTH TWICE DAILY. TAKE WITH A MEAL.   metoprolol tartrate 100 MG tablet Commonly known as:  LOPRESSOR TAKE 1 TABLET (100 MG TOTAL) BY MOUTH 2 (TWO) TIMES DAILY.   NOVOLOG FLEXPEN 100 UNIT/ML FlexPen Generic drug:  insulin aspart INJECT 20-22 UNITS INTO SKIN PRIOR TO BREAKFAST AND 20-22 UNITS PRIOR TO EVENING MEAL *1500/44=35*   nystatin-triamcinolone ointment Commonly known as:  MYCOLOG Apply topically 2 (two) times daily.   ONETOUCH DELICA LANCETS 83J Misc Use to check blood sugar twice daily. DX Code E11.9   ONETOUCH VERIO IQ SYSTEM w/Device Kit USE TO CHECK BLOOD SUGAR TWICE A DAY DX CODE E11.9   ramipril 10 MG capsule Commonly known as:  ALTACE Take 1 capsule (10 mg total) by mouth daily.   sildenafil 50 MG tablet Commonly known as:  VIAGRA Take 1 tablet (50 mg total) by mouth daily as needed for erectile dysfunction.   TRESIBA FLEXTOUCH 100 UNIT/ML Sopn FlexTouch Pen Generic drug:  insulin degludec 40 UNITS DAILY WITH SUPPER, IF AM SUGARS STAYING < 90 IN AM REDUCE TRESIBA TO 38 UNITS 1500/40=38       Allergies:  No Known Allergies  Past Medical History:  Diagnosis Date  . CORONARY ARTERY DISEASE 11/23/2006  . DIABETES MELLITUS, TYPE II 11/23/2006  . GOUT 05/20/2008  . HYPERLIPIDEMIA 11/23/2006  . HYPERTENSION 11/23/2006  . 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 PNEUMONIA, LEFT LOWER LOBE 09/08/2009    Past Surgical History:  Procedure Laterality Date  . CORONARY ANGIOPLASTY WITH STENT PLACEMENT    . CORONARY ARTERY BYPASS GRAFT  2003    Family History  Problem Relation Age of  Onset  . Heart disease Mother   . Heart disease Father   . Diabetes Sister   . Diabetes Brother     Social History:  reports that he has never smoked. He has never used smokeless tobacco. He reports that he does not drink alcohol or use drugs.    Review of Systems        Lipids:  LDL Adequately treated with Lipitor40 mg Has history of CAD, followed by PCP Needs follow-up labs       Lab Results  Component Value Date   CHOL 111 03/16/2018   HDL 30.40 (L) 03/16/2018   LDLCALC 60 03/16/2018   TRIG 100.0 03/16/2018   CHOLHDL 4 03/16/2018       The blood pressure has been treated with Norvasc10 mg, ramipril and metoprolol,  Followed by PCP Blood pressure is slightly higher today   BP Readings from Last 3 Encounters:  03/16/18 134/86  01/01/18 (!) 170/88  02/77/41 287/86     Complications: No evidence of neuropathy or nephropathy  Diabetic foot exam was done in 12/2017 Last exam with eye doctor = 10/09/2017        LABS:  Office Visit on 03/16/2018  Component Date Value Ref Range Status  . Hemoglobin A1C 03/19/2018 7.3* 4.0 - 5.6 % Final  . POC Glucose 03/16/2018 169* 70 - 99 mg/dl Final  . Cholesterol 03/16/2018 111  0 - 200 mg/dL Final   ATP III Classification       Desirable:  < 200 mg/dL               Borderline High:  200 - 239 mg/dL          High:  > = 240 mg/dL  . Triglycerides 03/16/2018 100.0  0.0 - 149.0 mg/dL Final   Normal:  <150 mg/dLBorderline High:  150 - 199 mg/dL  . HDL 03/16/2018 30.40* >39.00 mg/dL Final  . VLDL 03/16/2018 20.0  0.0 - 40.0 mg/dL Final  . LDL Cholesterol 03/16/2018 60  0 - 99 mg/dL Final  . Total CHOL/HDL Ratio 03/16/2018 4   Final                  Men          Women1/2 Average Risk     3.4          3.3Average Risk          5.0          4.42X Average Risk          9.6          7.13X Average Risk          15.0          11.0                      . NonHDL 03/16/2018 80.17   Final   NOTE:  Non-HDL goal should be 30 mg/dL higher than  patient's LDL goal (i.e. LDL goal of < 70 mg/dL, would have non-HDL goal of < 100 mg/dL)  . Microalb, Ur 03/16/2018 6.8* 0.0 - 1.9 mg/dL Final  . Creatinine,U 03/16/2018 51.5  mg/dL Final  . Microalb Creat Ratio 03/16/2018 13.1  0.0 - 30.0 mg/g Final  . Sodium 03/16/2018 139  135 - 145 mEq/L Final  . Potassium 03/16/2018 4.2  3.5 - 5.1 mEq/L Final  . Chloride 03/16/2018 101  96 - 112 mEq/L Final  . CO2 03/16/2018 31  19 - 32 mEq/L Final  . Glucose, Bld 03/16/2018 163* 70 - 99 mg/dL Final  . BUN 03/16/2018 10  6 - 23 mg/dL Final  . Creatinine, Ser 03/16/2018 0.72  0.40 - 1.50 mg/dL Final  . Total Bilirubin 03/16/2018 0.7  0.2 - 1.2 mg/dL Final  . Alkaline Phosphatase 03/16/2018 103  39 - 117 U/L Final  . AST 03/16/2018 26  0 - 37 U/L Final  . ALT 03/16/2018 24  0 - 53 U/L Final  . Total Protein 03/16/2018 7.8  6.0 - 8.3 g/dL Final  . Albumin 03/16/2018 4.3  3.5 - 5.2 g/dL Final  . Calcium 03/16/2018 9.6  8.4 - 10.5 mg/dL Final  . GFR 03/16/2018 137.84  >60.00 mL/min Final    Physical Examination:  BP 134/86  Pulse 64   Ht 6' (1.829 m)   Wt 219 lb (99.3 kg)   SpO2 98%   BMI 29.70 kg/m        ASSESSMENT/PLAN  Diabetes type 2, On insulin   See history of present illness for detailed discussion of current management, problems identified and blood sugar patterns.  A1c is relatively higher at 7.3  Difficult to assess his blood sugar control as he is taking readings only in the mornings Also he is not taking morning NovoLog based on what he is eating even though he is getting some carbohydrate daily Discussed with patient that since he did not check his readings after meals including today he does not know how high his sugars are when he skips his NovoLog in the morning Also not clear what his blood sugars are after supper His weight is about the same and is usually maintaining his diet and walking regularly  As before reminded him to check sugars after meals including at  night He will need to take at least a minimum of 16 units of NovoLog in the morning and verify how blood sugars are doing about 2 hours later Discussed that he will take NovoLog in the morning regardless of his blood sugar being normal and given today's example of blood sugar going up  Labs also to be checked including lipid panel  Patient Instructions  Check blood sugars on waking up  3/7 days  Also check blood sugars about 2 hours after a meal and do this after different meals by rotation  Recommended blood sugar levels on waking up is 90-130 and about 2 hours after meal is 130-160  Please bring your blood sugar monitor to each visit, thank you  Take 16-18 Novolog in am and 22 at supper          [name] 03/20/2018, 8:27 AM   Note: This office note was prepared with Dragon voice recognition system technology. Any transcriptional errors that result from this process are unintentional.

## 2018-03-16 NOTE — Patient Instructions (Addendum)
Check blood sugars on waking up  3/7 days  Also check blood sugars about 2 hours after a meal and do this after different meals by rotation  Recommended blood sugar levels on waking up is 90-130 and about 2 hours after meal is 130-160  Please bring your blood sugar monitor to each visit, thank you  Take 16-18 Novolog in am and 22 at supper

## 2018-03-19 LAB — POCT GLYCOSYLATED HEMOGLOBIN (HGB A1C): Hemoglobin A1C: 7.3 % — AB (ref 4.0–5.6)

## 2018-04-10 ENCOUNTER — Other Ambulatory Visit: Payer: Self-pay | Admitting: Internal Medicine

## 2018-04-25 ENCOUNTER — Telehealth: Payer: Self-pay | Admitting: Internal Medicine

## 2018-04-25 ENCOUNTER — Other Ambulatory Visit: Payer: Self-pay

## 2018-04-25 MED ORDER — INSULIN DEGLUDEC 100 UNIT/ML ~~LOC~~ SOPN: PEN_INJECTOR | SUBCUTANEOUS | 3 refills | Status: DC

## 2018-04-25 NOTE — Telephone Encounter (Signed)
Rx has been refilled electronically. No further action needed. Pt notified.

## 2018-04-25 NOTE — Telephone Encounter (Signed)
Pt is in the office in need of refill on Rx Tresiba Flextouch 100 unit (Flex Touch Pen)  Pharm:  [facility] on [address].

## 2018-05-24 DIAGNOSIS — H25012 Cortical age-related cataract, left eye: Secondary | ICD-10-CM | POA: Diagnosis not present

## 2018-05-24 DIAGNOSIS — H25812 Combined forms of age-related cataract, left eye: Secondary | ICD-10-CM | POA: Diagnosis not present

## 2018-05-24 DIAGNOSIS — H2512 Age-related nuclear cataract, left eye: Secondary | ICD-10-CM | POA: Diagnosis not present

## 2018-06-18 ENCOUNTER — Encounter: Payer: Self-pay | Admitting: Family Medicine

## 2018-06-18 ENCOUNTER — Ambulatory Visit (INDEPENDENT_AMBULATORY_CARE_PROVIDER_SITE_OTHER): Payer: PPO | Admitting: Family Medicine

## 2018-06-18 VITALS — BP 150/50 | HR 71 | Temp 98.5°F | Resp 16 | Wt 222.4 lb

## 2018-06-18 DIAGNOSIS — I2510 Atherosclerotic heart disease of native coronary artery without angina pectoris: Secondary | ICD-10-CM

## 2018-06-18 DIAGNOSIS — Z1159 Encounter for screening for other viral diseases: Secondary | ICD-10-CM

## 2018-06-18 DIAGNOSIS — Z9189 Other specified personal risk factors, not elsewhere classified: Secondary | ICD-10-CM

## 2018-06-18 DIAGNOSIS — I152 Hypertension secondary to endocrine disorders: Secondary | ICD-10-CM

## 2018-06-18 DIAGNOSIS — Z23 Encounter for immunization: Secondary | ICD-10-CM

## 2018-06-18 DIAGNOSIS — E1159 Type 2 diabetes mellitus with other circulatory complications: Secondary | ICD-10-CM | POA: Diagnosis not present

## 2018-06-18 DIAGNOSIS — E7800 Pure hypercholesterolemia, unspecified: Secondary | ICD-10-CM

## 2018-06-18 DIAGNOSIS — M1A9XX0 Chronic gout, unspecified, without tophus (tophi): Secondary | ICD-10-CM | POA: Diagnosis not present

## 2018-06-18 DIAGNOSIS — I10 Essential (primary) hypertension: Secondary | ICD-10-CM | POA: Diagnosis not present

## 2018-06-18 MED ORDER — RAMIPRIL 10 MG PO CAPS: 10.0000 mg | ORAL_CAPSULE | Freq: Two times a day (BID) | ORAL | 1 refills | Status: DC

## 2018-06-18 NOTE — Assessment & Plan Note (Signed)
Well-controlled with allopurinol 300 mg daily. Continue low purine diet. We could consider decreasing allopurinol dose, depending of uric acid level.

## 2018-06-18 NOTE — Progress Notes (Signed)
HPI:   Patrick Wilcox is a 72 y.o. male, who is here today to establish care.  Former PCP: Dr. Burnice Logan Last preventive routine visit: > a year ago.  Chronic medical problems: DM II, he follows with Dr. Dwyane Dee.  Next follow-up appointment on 06/19/2018.  Gout: Currently on allopurinol 300 mg daily. He has not had a gout attack in over 4 years. Tolerating medication well.  Lab Results  Component Value Date   LABURIC 5.6 11/21/2006   Hypertension:  + CAD, he has not followed with cardiologist in a while. Gated SPECT myocardial perfusion with Lexiscan stress test in 09/2015: Negative for ischemic changes.  Currently on amlodipine 10 mg daily, metoprolol tartrate 100 mg twice daily, and ramipril 10 mg daily. He does not check BP. Eye exam is current. He is taking medications as instructed, no side effects reported. He has not noted unusual headache, visual changes, exertional chest pain, dyspnea,  focal weakness, or edema.   Lab Results  Component Value Date   CREATININE 0.72 03/16/2018   BUN 10 03/16/2018   NA 139 03/16/2018   K 4.2 03/16/2018   CL 101 03/16/2018   CO2 31 03/16/2018    Hyperlipidemia: Currently on Lipitor 40 mg daily. Trying to follow low fat diet. He has not noted side effects with medication.  Lab Results  Component Value Date   CHOL 111 03/16/2018   HDL 30.40 (L) 03/16/2018   LDLCALC 60 03/16/2018   TRIG 100.0 03/16/2018   CHOLHDL 4 03/16/2018     Review of Systems  Constitutional: Negative for activity change, appetite change, fatigue, fever and unexpected weight change.  HENT: Negative for nosebleeds, sore throat and trouble swallowing.   Eyes: Negative for redness and visual disturbance.  Respiratory: Negative for apnea, cough, shortness of breath and wheezing.   Cardiovascular: Negative for chest pain, palpitations and leg swelling.  Gastrointestinal: Negative for abdominal pain, nausea and vomiting.  Genitourinary:  Negative for decreased urine volume, dysuria and hematuria.  Neurological: Negative for dizziness, seizures, weakness, numbness and headaches.  Psychiatric/Behavioral: Negative.       Current Outpatient Medications on File Prior to Visit  Medication Sig Dispense Refill  . Alcohol Swabs (B-D SINGLE USE SWABS REGULAR) PADS Use as necessary 300 each 6  . allopurinol (ZYLOPRIM) 300 MG tablet TAKE 1 TABLET BY MOUTH ONCE DAILY 90 tablet 1  . amLODipine (NORVASC) 10 MG tablet Take 1 tablet (10 mg total) by mouth daily. 90 tablet 1  . aspirin 81 MG tablet Take 81 mg by mouth daily.    Marland Kitchen atorvastatin (LIPITOR) 40 MG tablet Take 1 tablet (40 mg total) by mouth daily. 90 tablet 1  . Blood Glucose Monitoring Suppl (ONETOUCH VERIO IQ SYSTEM) w/Device KIT USE TO CHECK BLOOD SUGAR TWICE A DAY DX CODE E11.9 1 kit 2  . colchicine (COLCRYS) 0.6 MG tablet take 1 tablet by mouth once daily 90 tablet 3  . EASY COMFORT PEN NEEDLES 31G X 5 MM MISC Used to check  Blood glucose 3 times daily or prn. 100 each 3  . furosemide (LASIX) 20 MG tablet TAKE 1 TABLET BY MOUTH DAILY 90 tablet 1  . glucose blood (ONETOUCH VERIO) test strip Use as instructed 200 each 1  . insulin degludec (TRESIBA FLEXTOUCH) 100 UNIT/ML SOPN FlexTouch Pen 40 UNITS DAILY WITH SUPPER, IF AM SUGARS STAYING < 90 IN AM REDUCE TRESIBA TO 38 UNITS 1500/40=38 15 pen 3  . metFORMIN (GLUCOPHAGE) 1000 MG  tablet TAKE ONE TABLET BY MOUTH TWICE DAILY. TAKE WITH A MEAL. 180 tablet 1  . metoprolol tartrate (LOPRESSOR) 100 MG tablet TAKE 1 TABLET (100 MG TOTAL) BY MOUTH 2 (TWO) TIMES DAILY. 180 tablet 1  . NOVOLOG FLEXPEN 100 UNIT/ML FlexPen INJECT 20-22 UNITS INTO SKIN PRIOR TO BREAKFAST AND 20-22 UNITS PRIOR TO EVENING MEAL *1500/44=35* 5 pen 2  . nystatin-triamcinolone ointment (MYCOLOG) Apply topically 2 (two) times daily. 60 g 0  . ONETOUCH DELICA LANCETS 16L MISC Use to check blood sugar twice daily. DX Code E11.9 200 each 1  . sildenafil (VIAGRA) 50 MG  tablet Take 1 tablet (50 mg total) by mouth daily as needed for erectile dysfunction. 10 tablet 6   No current facility-administered medications on file prior to visit.      Past Medical History:  Diagnosis Date  . CORONARY ARTERY DISEASE 11/23/2006  . DIABETES MELLITUS, TYPE II 11/23/2006  . GOUT 05/20/2008  . HYPERLIPIDEMIA 11/23/2006  . HYPERTENSION 11/23/2006  . 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 PNEUMONIA, LEFT LOWER LOBE 09/08/2009   No Known Allergies  Family History  Problem Relation Age of Onset  . Heart disease Mother   . Heart disease Father   . Diabetes Sister   . Diabetes Brother     Social History   Socioeconomic History  . Marital status: Single    Spouse name: Not on file  . Number of children: Not on file  . Years of education: Not on file  . Highest education level: Not on file  Occupational History  . Not on file  Social Needs  . Financial resource strain: Not on file  . Food insecurity:    Worry: Not on file    Inability: Not on file  . Transportation needs:    Medical: Not on file    Non-medical: Not on file  Tobacco Use  . Smoking status: Never Smoker  . Smokeless tobacco: Never Used  Substance and Sexual Activity  . Alcohol use: No  . Drug use: No  . Sexual activity: Not on file  Lifestyle  . Physical activity:    Days per week: Not on file    Minutes per session: Not on file  . Stress: Not on file  Relationships  . Social connections:    Talks on phone: Not on file    Gets together: Not on file    Attends religious service: Not on file    Active member of club or organization: Not on file    Attends meetings of clubs or organizations: Not on file    Relationship status: Not on file  Other Topics Concern  . Not on file  Social History Narrative  . Not on file    Vitals:   06/18/18 0825  BP: (!) 150/50  Pulse: 71  Resp: 16  Temp: 98.5 F (36.9 C)  SpO2: 98%    Body mass index is 30.16 kg/m.   Physical Exam    Nursing note reviewed. Constitutional: He is oriented to person, place, and time. He appears well-developed. No distress.  HENT:  Head: Normocephalic and atraumatic.  Mouth/Throat: Oropharynx is clear and moist and mucous membranes are normal. Abnormal dentition.  Eyes: Pupils are equal, round, and reactive to light. Conjunctivae are normal.  Cardiovascular: Normal rate and regular rhythm.  Murmur (SEM I/VI RUSB and LUSB) heard. Pulses:      Dorsalis pedis pulses are 2+ on the right side, and 2+ on the left side.  Respiratory: Effort normal and breath sounds normal. No respiratory distress.  GI: Soft. He exhibits no mass. There is no hepatomegaly. There is no tenderness.  Musculoskeletal: He exhibits no edema.  Lymphadenopathy:    He has no cervical adenopathy.  Neurological: He is alert and oriented to person, place, and time. He has normal strength. No cranial nerve deficit. Gait normal.  Skin: Skin is warm. No rash noted. No erythema.  Psychiatric: He has a normal mood and affect.  Well groomed, good eye contact.    ASSESSMENT AND PLAN:   Patrick Wilcox was seen today for follow-up.  Diagnoses and all orders for this visit:  GOUT Well-controlled with allopurinol 300 mg daily. Continue low purine diet. We could consider decreasing allopurinol dose, depending of uric acid level.  Essential hypertension BP is not well controlled. Ramipril increased from 10 mg daily to 10 mg twice daily. Recommend monitoring BP periodically. Low-salt diet also recommended. Follow-up in 3 to 4 months.   Coronary atherosclerosis Asymptomatic. He is on metoprolol, Lipitor, and Aspirin 81 mg. Instructed about warning signs.   Pure hypercholesterolemia No changes in Lipitor 40 mg daily. F/U in 03/2019.  Encounter for HCV screening test for high risk patient -     Hepatitis C antibody; Future  Needs flu shot -     Flu vaccine HIGH DOSE PF      G. [name], MD  [facility]. [place] office.

## 2018-06-18 NOTE — Assessment & Plan Note (Signed)
BP is not well controlled. Ramipril increased from 10 mg daily to 10 mg twice daily. Recommend monitoring BP periodically. Low-salt diet also recommended. Follow-up in 3 to 4 months.

## 2018-06-18 NOTE — Patient Instructions (Addendum)
A few things to remember from today's visit:   Hypertension associated with diabetes ([place]) - Plan: ramipril (ALTACE) 10 MG capsule  Pure hypercholesterolemia  Atherosclerosis of native coronary artery of native heart without angina pectoris  Chronic gout without tophus, unspecified cause, unspecified site - Plan: Uric acid  Encounter for HCV screening test for high risk patient - Plan: Hepatitis C antibody  Ramipril increased from 10 mg daily to 10 mg 2 times per day. Monitor blood pressure at home.     Please be sure medication list is accurate. If a new problem present, please set up appointment sooner than planned today.

## 2018-06-18 NOTE — Assessment & Plan Note (Signed)
Asymptomatic. He is on metoprolol, Lipitor, and Aspirin 81 mg. Instructed about warning signs.

## 2018-06-19 ENCOUNTER — Ambulatory Visit: Payer: PPO | Admitting: Endocrinology

## 2018-06-19 ENCOUNTER — Encounter: Payer: Self-pay | Admitting: Endocrinology

## 2018-06-19 ENCOUNTER — Other Ambulatory Visit: Payer: Self-pay

## 2018-06-19 VITALS — BP 118/72 | HR 70 | Ht 72.0 in | Wt 222.2 lb

## 2018-06-19 DIAGNOSIS — Z794 Long term (current) use of insulin: Secondary | ICD-10-CM | POA: Diagnosis not present

## 2018-06-19 DIAGNOSIS — I10 Essential (primary) hypertension: Secondary | ICD-10-CM

## 2018-06-19 DIAGNOSIS — E1165 Type 2 diabetes mellitus with hyperglycemia: Secondary | ICD-10-CM

## 2018-06-19 LAB — BASIC METABOLIC PANEL
BUN: 9 mg/dL (ref 6–23)
CO2: 28 mEq/L (ref 19–32)
Calcium: 9.6 mg/dL (ref 8.4–10.5)
Chloride: 102 mEq/L (ref 96–112)
Creatinine, Ser: 0.75 mg/dL (ref 0.40–1.50)
GFR: 131.41 mL/min (ref >60.00)
Glucose, Bld: 128 mg/dL — ABNORMAL HIGH (ref 70–99)
Potassium: 4.2 mEq/L (ref 3.5–5.1)
Sodium: 137 mEq/L (ref 135–145)

## 2018-06-19 LAB — POCT GLYCOSYLATED HEMOGLOBIN (HGB A1C): Hemoglobin A1C: 7.1 % — AB (ref 4.0–5.6)

## 2018-06-19 NOTE — Progress Notes (Signed)
Patient ID: Patrick Wilcox, male   DOB: 04-27-1946, 72 y.o.   MRN: [id number]    Reason for Appointment: Followup for Type 2 Diabetes   History of Present Illness:          Diagnosis: Type 2 diabetes mellitus, date of diagnosis: 2004       Past history:  At diagnosis he was having symptoms of drowsiness, difficulty breathing and blood sugar was reportedly over 600 He was initially treated with insulin but subsequently switched back to oral drugs He may have taken metformin and other medications for some time before going back on insulin His records indicate that he had been taking Amaryl and Januvia before going back on insulin in mid-2014 when his A1c had gone up over 13 Initially was given premixed insulin and then switched to Lantus and Humalog Record of his A1c indicates that his levels previously had been over 8% since 02/2012 He  had much better blood sugar control on his last visit with his changing his lifestyle and being instructed by the nurse educator  His Lantus did not appear to have 24 hour control with once a day injection  Recent history:   INSULIN regimen is described as:  40 units Tresiba daily in p.m., Novolog 20 Ac breakfast and 0-10 supper   His A1c is about the same at 7.1  Glucose monitoring, current management and problems identified:  He now says that he is frequently not taking his NovoLog at dinnertime since he is taking [name] at bedtime  Again despite reminders he does not check his readings after his evening meal  On the other hand he does not get any hypoglycemia with taking 20 units of NovoLog in the morning  FASTING blood sugars are quite variable  He says he was concerned when his blood sugar was down to 76 in the morning 1 even though he was asymptomatic and does not understand what his normal blood sugar ranges should be  He may occasionally check his sugars late in the morning after breakfast and these are fairly good  overall  Tries to do some exercise with walking Hypoglycemia: None   DIET: Has seen the dietitian in 2014 and nurse educator in 8/18       Meals: 2 meals per day 10 am and 9 pm.   Breakfast is  eggs;  toast, eggs, has fruit for snacks.  Evening meal usually vegetables, tuna salad, chicken.         Oral hypoglycemic drugs the patient is taking are: Metformin 1 g twice a day       Compliance with the medical regimen:  Fair    Glucose monitoring: Checking less than 1 times a day        Glucometer:  One Touch Verio    Blood Glucose readings  from  Download show blood sugar range 60 up to 156 with median 113, all readings before noon   PRE-MEAL Fasting Lunch Dinner Bedtime Overall  Glucose range:  76-197      Mean/median:  117  6    124   POST-MEAL PC Breakfast PC Lunch PC Dinner  Glucose range:     Mean/median:         Exercise:  walking 30 min  3-5/7 days  Weight history:  Wt Readings from Last 3 Encounters:  06/19/18 222 lb 3.2 oz (100.8 kg)  06/18/18 222 lb 6.4 oz (100.9 kg)  03/16/18 219 lb (99.3 kg)  Glycemic control:   Lab Results  Component Value Date   HGBA1C 7.1 (A) 06/19/2018   HGBA1C 7.3 (A) 03/19/2018   HGBA1C 7.1 (H) 11/03/2017   Lab Results  Component Value Date   MICROALBUR 6.8 (H) 03/16/2018   LDLCALC 60 03/16/2018   CREATININE 0.72 03/16/2018    Other active problems: See review of systems   Office Visit on 06/19/2018  Component Date Value Ref Range Status  . Hemoglobin A1C 06/19/2018 7.1* 4.0 - 5.6 % Final      Allergies as of 06/19/2018   No Known Allergies     Medication List        Accurate as of 06/19/18  8:37 AM. Always use your most recent med list.          allopurinol 300 MG tablet Commonly known as:  ZYLOPRIM TAKE 1 TABLET BY MOUTH ONCE DAILY   amLODipine 10 MG tablet Commonly known as:  NORVASC Take 1 tablet (10 mg total) by mouth daily.   aspirin 81 MG tablet Take 81 mg by mouth daily.   atorvastatin  40 MG tablet Commonly known as:  LIPITOR Take 1 tablet (40 mg total) by mouth daily.   B-D SINGLE USE SWABS REGULAR Pads Use as necessary   colchicine 0.6 MG tablet take 1 tablet by mouth once daily   EASY COMFORT PEN NEEDLES 31G X 5 MM Misc Generic drug:  Insulin Pen Needle Used to check  Blood glucose 3 times daily or prn.   furosemide 20 MG tablet Commonly known as:  LASIX TAKE 1 TABLET BY MOUTH DAILY   glucose blood test strip Use as instructed   insulin degludec 100 UNIT/ML Sopn FlexTouch Pen Commonly known as:  TRESIBA 40 UNITS DAILY WITH SUPPER, IF AM SUGARS STAYING < 90 IN AM REDUCE TRESIBA TO 38 UNITS 1500/40=38   metFORMIN 1000 MG tablet Commonly known as:  GLUCOPHAGE TAKE ONE TABLET BY MOUTH TWICE DAILY. TAKE WITH A MEAL.   metoprolol tartrate 100 MG tablet Commonly known as:  LOPRESSOR TAKE 1 TABLET (100 MG TOTAL) BY MOUTH 2 (TWO) TIMES DAILY.   NOVOLOG FLEXPEN 100 UNIT/ML FlexPen Generic drug:  insulin aspart INJECT 20-22 UNITS INTO SKIN PRIOR TO BREAKFAST AND 20-22 UNITS PRIOR TO EVENING MEAL *1500/44=35*   nystatin-triamcinolone ointment Commonly known as:  MYCOLOG Apply topically 2 (two) times daily.   ONETOUCH DELICA LANCETS 94B Misc Use to check blood sugar twice daily. DX Code E11.9   ONETOUCH VERIO IQ SYSTEM w/Device Kit USE TO CHECK BLOOD SUGAR TWICE A DAY DX CODE E11.9   ramipril 10 MG capsule Commonly known as:  ALTACE Take 1 capsule (10 mg total) by mouth 2 (two) times daily.   sildenafil 50 MG tablet Commonly known as:  VIAGRA Take 1 tablet (50 mg total) by mouth daily as needed for erectile dysfunction.       Allergies:  No Known Allergies  Past Medical History:  Diagnosis Date  . CORONARY ARTERY DISEASE 11/23/2006  . DIABETES MELLITUS, TYPE II 11/23/2006  . GOUT 05/20/2008  . HYPERLIPIDEMIA 11/23/2006  . HYPERTENSION 11/23/2006  . 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 PNEUMONIA, LEFT LOWER LOBE 09/08/2009    Past  Surgical History:  Procedure Laterality Date  . CORONARY ANGIOPLASTY WITH STENT PLACEMENT    . CORONARY ARTERY BYPASS GRAFT  2003    Family History  Problem Relation Age of Onset  . Heart disease Mother   . Heart disease Father   . Diabetes  Sister   . Diabetes Brother     Social History:  reports that he has never smoked. He has never used smokeless tobacco. He reports that he does not drink alcohol or use drugs.    Review of Systems        Lipids:  LDL Adequately treated with Lipitor40 mg Has history of CAD, followed by PCP Needs follow-up labs       Lab Results  Component Value Date   CHOL 111 03/16/2018   HDL 30.40 (L) 03/16/2018   LDLCALC 60 03/16/2018   TRIG 100.0 03/16/2018   CHOLHDL 4 03/16/2018       The blood pressure has been treated with Norvasc10 mg, ramipril and metoprolol, Followed by PCP Blood pressure is better today He was told by his PCP to double his ramipril to 10 mg twice a day but his blood pressure was likely high from meeting a new doctor for the first time  BP Readings from Last 3 Encounters:  06/19/18 118/72  06/18/18 (!) 150/50  52/84/13 244/01     Complications: No evidence of neuropathy or nephropathy  Diabetic foot exam was done in 12/2017 Last exam with eye doctor = 10/09/2017        LABS:  Office Visit on 06/19/2018  Component Date Value Ref Range Status  . Hemoglobin A1C 06/19/2018 7.1* 4.0 - 5.6 % Final    Physical Examination:  BP 118/72 (BP Location: Left Arm, Patient Position: Sitting, Cuff Size: Normal)   Pulse 70   Ht 6' (1.829 m)   Wt 222 lb 3.2 oz (100.8 kg)   SpO2 97%   BMI 30.14 kg/m        ASSESSMENT/PLAN  Diabetes type 2, On insulin   See history of present illness for detailed discussion of current management, problems identified and blood sugar patterns.  A1c is relatively stable, now 7.1 compared to 7.3  As discussed above he does not understand the difference between [name] and NovoLog and is  not taking any mealtime coverage for his main meal in the evening Without checking the readings after supper not clear how high his sugars are Explained to him that some of his high readings as much as 197 in the morning are likely to be from not covering his evening meal His fasting readings however are overall averaging about 125 Have explained the need to take NovoLog with his evening meal consistently To avoid any low normal sugars after breakfast table take 18 units NovoLog both morning and evening Explained to him with a visual aid how basal and bolus insulins are working and timing of injections  Also reduce Tresiba by 2 units for now since he may have better readings overnight He does need to check readings after evening meal very consistently and discussed blood sugar targets  HYPERTENSION: Well-controlled  Discussed that since his blood pressure is much better today he can hold off on increasing his ramipril To check chemistry panel today  He will try to walk regularly  Counseling time on subjects discussed in assessment and plan sections is over 50% of today's 25 minute visit  Patient Instructions  Novolog 18 units with meals  Tresiba 38 units in pm  Check blood sugars on waking up 4 days a week  Also check blood sugars about 2 hours after meals and do this after different meals by rotation  Recommended blood sugar levels on waking up are 90-130 and about 2 hours after meal is 130-160  Please bring  your blood sugar monitor to each visit, thank you  Must do Novolog at dinner  Need bedtime sugars         [name] 06/19/2018, 8:37 AM   Note: This office note was prepared with Dragon voice recognition system technology. Any transcriptional errors that result from this process are unintentional.

## 2018-06-19 NOTE — Patient Instructions (Signed)
Novolog 18 units with meals  Tresiba 38 units in pm  Check blood sugars on waking up 4 days a week  Also check blood sugars about 2 hours after meals and do this after different meals by rotation  Recommended blood sugar levels on waking up are 90-130 and about 2 hours after meal is 130-160  Please bring your blood sugar monitor to each visit, thank you  Must do Novolog at dinner  Need bedtime sugars

## 2018-06-21 ENCOUNTER — Other Ambulatory Visit: Payer: Self-pay | Admitting: Internal Medicine

## 2018-07-13 ENCOUNTER — Other Ambulatory Visit: Payer: Self-pay | Admitting: Internal Medicine

## 2018-07-16 ENCOUNTER — Telehealth: Payer: Self-pay | Admitting: Family Medicine

## 2018-07-16 ENCOUNTER — Other Ambulatory Visit: Payer: Self-pay | Admitting: *Deleted

## 2018-07-16 MED ORDER — METFORMIN HCL 1000 MG PO TABS: ORAL_TABLET | ORAL | 1 refills | Status: DC

## 2018-07-16 MED ORDER — METOPROLOL TARTRATE 100 MG PO TABS: 100.0000 mg | ORAL_TABLET | Freq: Two times a day (BID) | ORAL | 1 refills | Status: DC

## 2018-07-16 MED ORDER — ALLOPURINOL 300 MG PO TABS: 300.0000 mg | ORAL_TABLET | Freq: Every day | ORAL | 1 refills | Status: DC

## 2018-07-16 NOTE — Telephone Encounter (Signed)
Copied from [place] [id number]. Topic: Quick Communication - Rx Refill/Question >> Jul 16, 2018 12:03 PM Windy Kalata wrote: Medication: Patient has 9 meds   Has the patient contacted their pharmacy? Yes.   (Agent: If no, request that the patient contact the pharmacy for the refill.) (Agent: If yes, when and what did the pharmacy advise?) Pharmacy needs a call okaying all his med refills  Preferred Pharmacy (with phone number or street name): [id number] [place], [place] [phone] (Phone) [phone] (Fax)    Agent: Please be advised that RX refills may take up to 3 business days. We ask that you follow-up with your pharmacy.

## 2018-07-17 NOTE — Telephone Encounter (Signed)
Will await pharmacy request

## 2018-07-25 ENCOUNTER — Other Ambulatory Visit: Payer: Self-pay

## 2018-07-25 MED ORDER — COLCHICINE 0.6 MG PO TABS: ORAL_TABLET | ORAL | 3 refills | Status: DC

## 2018-07-25 MED ORDER — ATORVASTATIN CALCIUM 40 MG PO TABS: 40.0000 mg | ORAL_TABLET | Freq: Every day | ORAL | 1 refills | Status: DC

## 2018-07-25 MED ORDER — AMLODIPINE BESYLATE 10 MG PO TABS: 10.0000 mg | ORAL_TABLET | Freq: Every day | ORAL | 1 refills | Status: DC

## 2018-07-25 MED ORDER — SILDENAFIL CITRATE 50 MG PO TABS: 50.0000 mg | ORAL_TABLET | Freq: Every day | ORAL | 6 refills | Status: DC | PRN

## 2018-08-14 ENCOUNTER — Other Ambulatory Visit: Payer: Self-pay | Admitting: Internal Medicine

## 2018-09-11 ENCOUNTER — Other Ambulatory Visit: Payer: Self-pay | Admitting: *Deleted

## 2018-09-11 MED ORDER — INSULIN DEGLUDEC 100 UNIT/ML ~~LOC~~ SOPN: PEN_INJECTOR | SUBCUTANEOUS | 3 refills | Status: DC

## 2018-09-12 ENCOUNTER — Telehealth: Payer: Self-pay | Admitting: *Deleted

## 2018-09-12 NOTE — Telephone Encounter (Signed)
Rx request for Sildenafil 50 mg tablet

## 2018-09-14 ENCOUNTER — Other Ambulatory Visit: Payer: PPO

## 2018-09-14 ENCOUNTER — Other Ambulatory Visit: Payer: Self-pay | Admitting: Family Medicine

## 2018-09-14 MED ORDER — SILDENAFIL CITRATE 50 MG PO TABS: 50.0000 mg | ORAL_TABLET | Freq: Every day | ORAL | 4 refills | Status: DC | PRN

## 2018-09-14 NOTE — Addendum Note (Signed)
Addended by: [name], Trentyn Boisclair G on: 09/14/2018 05:18 PM   Modules accepted: Orders

## 2018-09-14 NOTE — Telephone Encounter (Signed)
Prescription for sildenafil 50 mg was sent to his pharmacy in 07/2018 with 6 refills. New prescription sent to his pharmacy. Thanks, BJ

## 2018-09-17 ENCOUNTER — Telehealth: Payer: Self-pay | Admitting: *Deleted

## 2018-09-17 NOTE — Telephone Encounter (Signed)
Aware. Thanks, BJ

## 2018-09-17 NOTE — Telephone Encounter (Signed)
Copied from [place] [id number]. Topic: General - Inquiry >> Sep 17, 2018 10:40 AM Ahmed Prima L wrote: Reason for CRM: [facility] called and said they will be faxing a form over to be signed by Dr [name] regarding an audit they are having to do on his pen needles that he received in August. She said just sign it and fax back

## 2018-09-18 ENCOUNTER — Ambulatory Visit: Payer: Self-pay | Admitting: Family Medicine

## 2018-09-19 ENCOUNTER — Ambulatory Visit: Payer: PPO | Admitting: Endocrinology

## 2018-09-21 ENCOUNTER — Other Ambulatory Visit: Payer: Self-pay | Admitting: *Deleted

## 2018-09-21 NOTE — Patient Outreach (Signed)
  [facility]) Care Management Chronic Special Needs Program  09/21/2018  Name: Patrick Wilcox DOB: 1946/05/17  MRN: [id number]  [name] is enrolled in a chronic special needs plan for  Diabetes. A completed health risk assessment has not been received from the client and client has not responded to outreach attempts by their health care concierge.  The client's individualized care plan was developed based on available data.  Plan:  . Send unsuccessful outreach letter with a copy of individualized care plan to client . Send Neurosurgeon on self management of heart disease and HTN. . Send Emergency planning/management officer . Send individualized care plan to provider  Chronic care management coordinator, Peter Garter, will attempt outreach in 2-4 months.   Barrington Ellison RN,CCM,CDE [place] Management Coordinator Office Phone [phone] Office Fax [phone]

## 2018-10-04 ENCOUNTER — Other Ambulatory Visit: Payer: Self-pay | Admitting: Family Medicine

## 2018-10-04 NOTE — Telephone Encounter (Signed)
Requested medication (s) are due for refill today: Yes  Requested medication (s) are on the active medication list: Yes  Last refill:  03/06/18  Future visit scheduled: No  Notes to clinic:  Pt. Was a no show for last visit.    Requested Prescriptions  Pending Prescriptions Disp Refills   furosemide (LASIX) 20 MG tablet 90 tablet 1    Sig: Take 1 tablet (20 mg total) by mouth daily.     Cardiovascular:  Diuretics - Loop Passed - 10/04/2018  2:16 PM      Passed - K in normal range and within 360 days    Potassium  Date Value Ref Range Status  06/19/2018 4.2 3.5 - 5.1 mEq/L Final         Passed - Ca in normal range and within 360 days    Calcium  Date Value Ref Range Status  06/19/2018 9.6 8.4 - 10.5 mg/dL Final         Passed - Na in normal range and within 360 days    Sodium  Date Value Ref Range Status  06/19/2018 137 135 - 145 mEq/L Final         Passed - Cr in normal range and within 360 days    Creatinine, Ser  Date Value Ref Range Status  06/19/2018 0.75 0.40 - 1.50 mg/dL Final         Passed - Last BP in normal range    BP Readings from Last 1 Encounters:  06/19/18 118/72         Passed - Valid encounter within last 6 months    Recent Outpatient Visits          3 months ago Hypertension associated with diabetes ([place])   Therapist, music at [facility] [name], [name], MD   9 months ago Essential hypertension   Therapist, music at [facility], Doretha Sou, MD   1 year ago Essential hypertension   Therapist, music at [facility], Doretha Sou, MD   1 year ago Essential hypertension   Therapist, music at [facility], Doretha Sou, MD   2 years ago Essential hypertension   Therapist, music at [facility], Doretha Sou, MD

## 2018-10-05 MED ORDER — FUROSEMIDE 20 MG PO TABS: 20.0000 mg | ORAL_TABLET | Freq: Every day | ORAL | 1 refills | Status: DC

## 2018-10-09 ENCOUNTER — Telehealth: Payer: Self-pay | Admitting: Family Medicine

## 2018-10-09 NOTE — Telephone Encounter (Signed)
Copied from [place] [id number]. Topic: Quick Communication - Rx Refill/Question >> Oct 09, 2018  3:47 PM Loma Boston wrote: Medication: ramipril (ALTACE) 10 MG capsule   allopurinol (ZYLOPRIM) 300 MG tablet colchicine (COLCRYS) 0.6 MG tablet   atorvastatin (LIPITOR) 40 MG tablet  amLODipine (NORVASC) 10 MG tablet PT needs these [id number] [place] [phone] (Phone) [phone] (Fax) NOT TO [facility]!!!!       Agent: Please be advised that RX refills may take up to 3 business days. We ask that you follow-up with your pharmacy.

## 2018-10-10 ENCOUNTER — Other Ambulatory Visit: Payer: Self-pay | Admitting: *Deleted

## 2018-10-10 DIAGNOSIS — I10 Essential (primary) hypertension: Principal | ICD-10-CM

## 2018-10-10 DIAGNOSIS — E1159 Type 2 diabetes mellitus with other circulatory complications: Secondary | ICD-10-CM

## 2018-10-10 MED ORDER — RAMIPRIL 10 MG PO CAPS: 10.0000 mg | ORAL_CAPSULE | Freq: Two times a day (BID) | ORAL | 1 refills | Status: DC

## 2018-10-10 MED ORDER — ALLOPURINOL 300 MG PO TABS: 300.0000 mg | ORAL_TABLET | Freq: Every day | ORAL | 1 refills | Status: DC

## 2018-10-10 MED ORDER — ATORVASTATIN CALCIUM 40 MG PO TABS: 40.0000 mg | ORAL_TABLET | Freq: Every day | ORAL | 1 refills | Status: DC

## 2018-10-10 MED ORDER — AMLODIPINE BESYLATE 10 MG PO TABS: 10.0000 mg | ORAL_TABLET | Freq: Every day | ORAL | 1 refills | Status: DC

## 2018-10-10 MED ORDER — COLCHICINE 0.6 MG PO TABS: ORAL_TABLET | ORAL | 3 refills | Status: DC

## 2018-10-10 NOTE — Telephone Encounter (Signed)
Medication refills sent to patient preferred pharmacy

## 2018-10-15 ENCOUNTER — Other Ambulatory Visit: Payer: Self-pay | Admitting: Family Medicine

## 2018-11-16 ENCOUNTER — Other Ambulatory Visit: Payer: Self-pay | Admitting: *Deleted

## 2018-11-16 MED ORDER — FUROSEMIDE 20 MG PO TABS: 20.0000 mg | ORAL_TABLET | Freq: Every day | ORAL | 1 refills | Status: DC

## 2018-12-05 ENCOUNTER — Ambulatory Visit: Payer: Self-pay

## 2018-12-26 ENCOUNTER — Encounter: Payer: Self-pay | Admitting: Gastroenterology

## 2019-01-04 ENCOUNTER — Other Ambulatory Visit: Payer: Self-pay | Admitting: Family Medicine

## 2019-01-05 ENCOUNTER — Other Ambulatory Visit: Payer: Self-pay | Admitting: Family Medicine

## 2019-01-08 NOTE — Telephone Encounter (Signed)
Please advise pt to request insulin Rx from his endocrinologist.  Thanks, BJ

## 2019-01-10 ENCOUNTER — Other Ambulatory Visit: Payer: Self-pay | Admitting: Family Medicine

## 2019-01-18 ENCOUNTER — Other Ambulatory Visit: Payer: Self-pay | Admitting: *Deleted

## 2019-01-18 DIAGNOSIS — E1159 Type 2 diabetes mellitus with other circulatory complications: Secondary | ICD-10-CM

## 2019-01-18 MED ORDER — ONETOUCH VERIO VI STRP: ORAL_STRIP | 1 refills | Status: DC

## 2019-01-18 MED ORDER — RAMIPRIL 10 MG PO CAPS: 10.0000 mg | ORAL_CAPSULE | Freq: Two times a day (BID) | ORAL | 1 refills | Status: DC

## 2019-01-31 ENCOUNTER — Other Ambulatory Visit: Payer: Self-pay | Admitting: *Deleted

## 2019-01-31 NOTE — Patient Outreach (Signed)
  [facility]) Care Management Chronic Special Needs Program    01/31/2019  Name: Patrick Wilcox, DOB: October 19, 1945  MRN: [id number]   Patrick Wilcox is enrolled in a chronic special needs plan for Diabetes. Reached Patrick Wilcox at his home number to complete initial telephone assessment. He requested return call next week. With his agreement, scheduled intake call for Tuesday 02/05/19 at 10:00 am.  Barrington Ellison RN,CCM,CDE [place] Management Coordinator Office Phone [phone] Office Fax [phone]

## 2019-02-05 ENCOUNTER — Ambulatory Visit: Payer: Self-pay | Admitting: *Deleted

## 2019-02-06 ENCOUNTER — Encounter: Payer: Self-pay | Admitting: *Deleted

## 2019-02-06 ENCOUNTER — Other Ambulatory Visit: Payer: Self-pay | Admitting: Family Medicine

## 2019-02-06 ENCOUNTER — Other Ambulatory Visit: Payer: Self-pay | Admitting: *Deleted

## 2019-02-06 DIAGNOSIS — E0859 Diabetes mellitus due to underlying condition with other circulatory complications: Secondary | ICD-10-CM

## 2019-02-06 DIAGNOSIS — I10 Essential (primary) hypertension: Secondary | ICD-10-CM

## 2019-02-06 DIAGNOSIS — I2510 Atherosclerotic heart disease of native coronary artery without angina pectoris: Secondary | ICD-10-CM

## 2019-02-06 DIAGNOSIS — I252 Old myocardial infarction: Secondary | ICD-10-CM

## 2019-02-06 DIAGNOSIS — Z794 Long term (current) use of insulin: Secondary | ICD-10-CM

## 2019-02-06 NOTE — Patient Outreach (Addendum)
[facility]) Care Management Chronic Special Needs Program  02/06/2019  Name: Patrick Wilcox DOB: May 31, 1946  MRN: [id number]  [name] is enrolled in a chronic special needs plan for Diabetes. Chronic Care Management Coordinator telephoned client to review health risk assessment and to develop individualized care plan.  Introduced the chronic care management program, importance of client participation, and taking their care plan to all provider appointments and inpatient facilities.  Reviewed the transition of care process and possible referral to community care management.  Subjective: [name] says he is doing well but needs help with copays for his insulins and Viagra. He says he is in the coverage gap.  He states his most recent Hgb A1C was "around 7%". He says he walks daily, takes all his medications as prescribed and keeps provider appointments.  He says he keeps a rash on his sides and abdomen and he thinks it's related to his insulin injection sites. He says he only uses his abdomen to inject, he was not aware of any other appropriate injection sites.  He voices good understanding of his heart disease treament plan. He says he takes his aspirin and beta blocker as prescribed. He says that he has had no heart issues since his heart attack and heart surgery in 2001.   He says he has a home blood pressure monitor but doesn't know how to use it. He report his blood pressure is  "always good".  He states he lives alone, had twin sons, one died this spring of a stroke at age 85. His surviving son lives in [place]. He says he wants to complete an advance directive and appoint a health care power of attorney and requests that another packet be mailed to him as he cannot find the packet that was mailed to him in March.   Goals Addressed            This Visit's Progress   .  Acknowledge receipt of Advanced Directive package Reviewed purpose and importance of  advance directive and health care power of attorney Mailed another advance directive package with educational handout on advance directive      . Client understands the importance of follow-up with providers by attending scheduled visits   On track   . COMPLETED: Client verbalizes knowledge of Heart Attack self management skills within the next 9 months      . Client will report abillity to obtain Medications within 5 months       Referred to pharmacy for copay and gap coverage assistance    . Client will report no worsening of symptoms related to heart disease within the next 5 months      . Client will verbalize knowledge of self management of Hypertension as evidences by BP reading of 140/90 or less; or as defined by provider Mailed handout on how to take blood pressure at home   On track   . HEMOGLOBIN A1C < 7.0       Reviewed targets for Hgb A1C and pre and post meal blood sugar Mailed educational handout on how to rotate insulin injection sites Mailed information on the [employer] flash glucose monitoring system and reviewed insurance plan coverage of the system. Encouraged him to discuss with Dr Dwyane Dee at next office visit.    . Maintain timely refills of diabetic medication as prescribed within the year .   On track   . Obtain annual  Lipid Profile, LDL-C   On track   .  Obtain Annual Eye (retinal)  Exam    On track   . Obtain Annual Foot Exam   On track   . Obtain annual screen for micro albuminuria (urine) , nephropathy (kidney problems)   On track   . Obtain Hemoglobin A1C at least 2 times per year   On track   . Visit Primary Care Provider or Endocrinologist at least 2 times per year    On track    Assessment:  Client is not meeting diabetes self-management goal of hemoglobin A1C of <7% with most recent reading of 7.1% on 06/19/18 without reports of hypoglycemia . Client has good understanding of:  COVID-19 cause, symptoms, precautions (social distancing, stay at home order,  hand washing, wear face covering when unable to maintain or ensure 6 foot social distancing), and symptoms requiring provider notification.  Plan:  Send successful outreach letter with a copy of their individualized care plan, send individual care plan to provider and send educational material on advance directive and how to take blood pressure at home,  insulin injection sites and [employer] flash  glucose monitoring system  Chronic care management coordination will outreach in:  5 months  Will refer client to:  Pharmacy for medication copay assisstance   Kelli Churn RN, CCM, [place] Management [phone]

## 2019-02-07 ENCOUNTER — Telehealth: Payer: Self-pay | Admitting: Pharmacist

## 2019-02-07 ENCOUNTER — Other Ambulatory Visit: Payer: Self-pay

## 2019-02-07 NOTE — Patient Outreach (Signed)
[facility]) Care Management  [place]   02/07/2019  Patrick Wilcox 16-May-1946 [id number]  Reason for referral: Medication Assistance, Medication Review  Referral source: [employer] C-SNP Care Manager with [facility] Current insurance: [employer]  PMHx includes but not limited to:   Coronary Atherosclerosis, hypertension, history of MI, hyperlipidemia  Outreach:  Successful telephone call with patient.  HIPAA identifiers verified.   Subjective:  Patient is a 73 year old male with the medical conditions mentioned above.  He reported feeling "ok" today but said his insulin's have gotten expensive since he has hit the donut hole (>$100 a piece).    Does the patient ever forget to take medication?  no Does the patient have problems obtaining medications due to transportation?   yes Does the patient have problems obtaining medications due to cost?  yes  Does the patient feel that medications prescribed are effective?  yes Does the patient ever experience any side effects to the medications prescribed?  no  Does the patient measure his/her own blood glucose at home?  Yes 121 mg/dl this morning Does the patient measure his/her own blood pressure at home? No (purchased a blood pressure monitor recently)   Objective: The ASCVD Risk score Mikey Bussing DC Jr., et al., 2013) failed to calculate for the following reasons:   The patient has a prior MI or stroke diagnosis  Lab Results  Component Value Date   CREATININE 0.75 06/19/2018   CREATININE 0.72 03/16/2018   CREATININE 0.77 11/03/2017    Lab Results  Component Value Date   HGBA1C 7.1 (A) 06/19/2018    Lipid Panel     Component Value Date/Time   CHOL 111 03/16/2018 0931   TRIG 100.0 03/16/2018 0931   TRIG 59 05/30/2006 0949   HDL 30.40 (L) 03/16/2018 0931   CHOLHDL 4 03/16/2018 0931   VLDL 20.0 03/16/2018 0931   LDLCALC 60 03/16/2018 0931    BP Readings from Last 3 Encounters:   06/19/18 118/72  06/18/18 (!) 150/50  03/16/18 134/86    No Known Allergies  Medications Reviewed Today    Reviewed by Elayne Guerin, [place] (Pharmacist) on 02/07/19 at [place] List Status: <None>  Medication Order Taking? Sig Documenting Provider Last Dose Status Informant  Alcohol Swabs (B-D SINGLE USE SWABS REGULAR) PADS [id number] Yes Use as necessary Marletta Lor, MD Taking Active   allopurinol (ZYLOPRIM) 300 MG tablet [id number] Yes Take 1 tablet (300 mg total) by mouth daily. [name], [name], MD Taking Active   amLODipine (NORVASC) 10 MG tablet [id number] Yes Take 1 tablet (10 mg total) by mouth daily. [name], [name], MD Taking Active   aspirin 81 MG tablet [id number] Yes Take 81 mg by mouth daily. [provider] Taking Active   atorvastatin (LIPITOR) 40 MG tablet [id number] Yes Take 1 tablet (40 mg total) by mouth daily. [name], [name], MD Taking Active   Blood Glucose Monitoring [facility] VERIO IQ SYSTEM) w/Device Drucie Opitz [id number] Yes USE TO CHECK BLOOD SUGAR TWICE A DAY DX CODE E11.9 [name], MD Taking Active   colchicine (COLCRYS) 0.6 MG tablet [id number] Yes take 1 tablet by mouth once daily [name], [name], MD Taking Active   EASY COMFORT PEN NEEDLES 31G X 5 MM MISC [id number] Yes Used to check  Blood glucose 3 times daily or prn. Marletta Lor, MD Taking Active   furosemide (LASIX) 20 MG tablet [id number] Yes Take 1 tablet (20 mg total)  by mouth daily. [name], [name], MD Taking Active   glucose blood [facility] VERIO) test strip [id number] Yes Use as instructed [name], [name], MD Taking Active   metFORMIN (GLUCOPHAGE) 1000 MG tablet [id number] Yes TAKE ONE TABLET BY MOUTH TWICE DAILY. TAKE WITH A MEAL. [name], [name], MD Taking Active   metoprolol tartrate (LOPRESSOR) 100 MG tablet [id number] Yes TAKE 1 TABLET BY MOUTH TWICE A DAY [name], [name], MD Taking Active   NOVOLOG FLEXPEN 100 UNIT/ML FlexPen [id number] Yes INJECT 20-22 UNITS INTO SKIN PRIOR TO  BREAKFAST AND 20-22 UNITS PRIOR TO EVENING MEAL *1500/44=35* [name], [name], MD Taking Active            Med Note Broadus John, Lelon Huh Feb 06, 2019  1:09 PM) Says he only takes one dose of novolog at supper and he takes between 15-22 units  nystatin-triamcinolone ointment [facility]) [id number] Yes Apply topically 2 (two) times daily. Marletta Lor, MD Taking Active            Med Note Broadus John, Lelon Huh Feb 06, 2019 12:25 PM) For rash on sides and stomach  [facility] LANCETS 15V MISC [id number] Yes Use to check blood sugar twice daily. DX Code E11.9 [name], MD Taking Active   ramipril (ALTACE) 10 MG capsule [id number] Yes Take 1 capsule (10 mg total) by mouth 2 (two) times daily. [name], [name], MD Taking Active   sildenafil (VIAGRA) 50 MG tablet [id number] Yes Take 1 tablet (50 mg total) by mouth daily as needed for erectile dysfunction. [name], [name], MD Taking Active   TRESIBA FLEXTOUCH 100 UNIT/ML SOPN FlexTouch Pen [id number] Yes 40 UNITS DAILY WITH SUPPER, IF AM SUGARS STAYING < 90 IN AM REDUCE TRESIBA TO 41 UNITS 1500/40=38 [name], [name], MD Taking Active            Med Note Broadus John, Trude Mcburney   Wed Feb 06, 2019  1:08 PM) Self injects at 7 pm daily          Assessment: Drugs sorted by system:  Cardiovascular:  Amlodipine, Aspirin, Atorvastatin, Furosemide,  Metoprolol Tartrate, Ramipril  Endocrine: Metformin, Novolog, Tresiba,  Renal: Allopurinol, Colchicine  Topical: Nystatin Triamcinolone  Miscellaneous: Sildenafil  Medication Review Findings:  . Colchicine/Atorvastatin-combination causes increased risk of myopathy with statins.  o Patient has not had a gout flare in quite some time. He is taking colchicine daily. . If deemed therapeutically appropriate, he could save colchicine for gout flares. (It is unclear if he was ever on NSAIDs)  . Patient could also  be switched to a different statin. HgA1c- 7.1 On statin-Atorvastatin 60m filled 10/16/18  and 01/12/19  Medication Adherence Findings: Adherence Review  '[]'  Excellent (no doses missed/week)     '[x]'  Good (no more than 1 dose missed/week)     '[]'  Partial (2-3 doses missed/week) '[]'  Poor (>3 doses missed/week)  Patient with good understanding of regimen and good understanding of indications.    Medication Assistance Findings:  Medication assistance needs identified: [place] Novolog  Extra Help:  Not eligible for Extra Help Low Income Subsidy based on reported income and assets  Patient Assistance Programs: Novolog & TTyler Aasmade by  o Income requirement met: Yes o Out-of-pocket prescription expenditure met:   Not Applicable - Patient has met application requirements to apply for this program.    Additional medication assistance options reviewed with patient as warranted:  CVisual merchandiser Plan: . Will  route note to Aldine Simcox to send patient assistance application..  . Will follow-up in 6-8 weeks..  . Will send patient free immediate use coupon. . Will Route note to PCP  Elayne Guerin, PharmD, [place] Clinical Pharmacist [phone]

## 2019-02-07 NOTE — Telephone Encounter (Signed)
Patient need to schedule an ov for more refills. 

## 2019-02-12 ENCOUNTER — Other Ambulatory Visit: Payer: Self-pay | Admitting: Pharmacy Technician

## 2019-02-12 NOTE — Patient Outreach (Signed)
[place] [facility]) Care Management  02/12/2019  DAVI KROON 1945-10-13 [id number]                           Medication Assistance Referral  Referral From: [place]  Medication/Company: [employer] / Novo NOrdisk Patient application portion:  Education officer, museum portion: Faxed  to Dr [name]    Follow up:  Will follow up with patient in 5-10 business days to confirm application(s) have been received.  Joshu Furukawa P. Leianne Callins, [place] Management [phone]

## 2019-02-22 ENCOUNTER — Other Ambulatory Visit: Payer: Self-pay | Admitting: Pharmacy Technician

## 2019-02-22 NOTE — Patient Outreach (Signed)
[place]) Care Management  02/22/2019  KEIAN ODRISCOLL 16-Jun-1946 [id number]   Successful outreach call placed to patient in regards to [employer] application for [employer].  Spoke to patient, HIPAA identifiers verified.  Patient informed he has received the application in the mail but has not mailed it back yet. He informed he would try and get it in the mail as soon as possible.  Will followup with patient in 10-15 business days if application has not been received back.  Kieley Akter P. Artha Chiasson, [place] Management [phone]

## 2019-03-01 ENCOUNTER — Ambulatory Visit: Payer: HMO | Admitting: Pharmacist

## 2019-03-15 ENCOUNTER — Other Ambulatory Visit: Payer: Self-pay | Admitting: Pharmacy Technician

## 2019-03-15 NOTE — Patient Outreach (Signed)
[name]) Care Management  03/15/2019  Patrick Wilcox 1946-01-15 [name]   Unsuccessful outreach call placed to patient in regards to [employer] application for [employer].This was my 2nd outreach attempt.  Unfortunately patient did not answer the phone, attempted to leave HIPAA compliant voicemail on house line but not sure if it recorded the message. The patient's mobile line said that a voicemail had not been set up yet and then disconnected the call.  Will attempt another outreach in 3-7 business days.  [name] [name], [name] Management [phone]

## 2019-03-25 ENCOUNTER — Other Ambulatory Visit: Payer: Self-pay | Admitting: Pharmacy Technician

## 2019-03-25 NOTE — Patient Outreach (Signed)
[facility]) Care Management  03/25/2019  BINGHAM PICCIOTTO 02-03-1946 [id number]   Unsuccessful outreach call placed to patient in regards to  [employer] application for [employer].  Unfortunately patient did not answer the phone, No message could be left as the home phone number said the call party was temporarily unavailable and the cell phone says a voicemail has not been set up yet. Today was my 3rd phone call but 2nd attempt after patient confirmed he received the application.  Was calling patient to inquire if he mailed back the application as he informed he would in our 02/22/2019 phone call.   Will followup with patient in 5-7 business days if application is not received.  Bearett Porcaro P. Arinze Rivadeneira, [place] Management [phone]

## 2019-04-03 ENCOUNTER — Other Ambulatory Visit: Payer: Self-pay | Admitting: Pharmacy Technician

## 2019-04-03 NOTE — Patient Outreach (Signed)
[facility]) Care Management  04/03/2019  Patrick Wilcox 1945/09/03 [name]  Successful outreach call placed to patient in regards to [employer] application for [employer].  Spoke to patient, HIPAA identifiers verified.  Today was my 4th overall phone call to patient (3rd after confirming receipt of application.  Patient informed he has not mailed the applications back. He said "let me get in here this week and try & find them & get them back to you."  Will route note to [place] that I am unable to keep patient engaged in order to give me a timeline as to when he thinks he may mail & them back in. Will close pharmacy assistance case at this time due to the lack of engagement with the option to re open the case once the information has been received back. Will remove myself from care team.  Luiz Ochoa. Shaketa Serafin, [place] Management [phone]

## 2019-04-08 ENCOUNTER — Other Ambulatory Visit: Payer: Self-pay | Admitting: Family Medicine

## 2019-04-16 ENCOUNTER — Other Ambulatory Visit: Payer: Self-pay | Admitting: Family Medicine

## 2019-04-16 ENCOUNTER — Other Ambulatory Visit: Payer: Self-pay | Admitting: Pharmacist

## 2019-04-16 NOTE — Patient Outreach (Signed)
[facility]) Care Management  04/16/2019  PARESH TUTWILER January 22, 1946 [id number]   Patient was called for CSNP follow up and to inquire about the patient assistance forms that have been sent to him. HIPAA identifiers were obtained.  Patient is a 73 year old male with a medical history significant for:  Hypertension, coronary atherosclerosis, gout, type 2 diabetes, and  history of MI.  Patient reported feeling fine but that he has been too busy to send the forms back.  HgA1c 06/19/18- 7.1% On statin-Atorvastatin last filled-01/15/2019 [facility], At[facility]atin was out of refills. Requested they send a refill request to Dr. [name].  [facility] being [facility].  Plan: Follow up with the patient in 3 months.  Elayne Guerin, PharmD, [place] Clinical Pharmacist [phone]

## 2019-04-18 ENCOUNTER — Other Ambulatory Visit: Payer: Self-pay | Admitting: Family Medicine

## 2019-04-20 ENCOUNTER — Other Ambulatory Visit: Payer: Self-pay | Admitting: Family Medicine

## 2019-04-25 ENCOUNTER — Telehealth: Payer: Self-pay | Admitting: Family Medicine

## 2019-04-25 NOTE — Telephone Encounter (Signed)
Pt came in the office to make an appointment and was in need of Rx NOVOLOG FLEXPEN 100 UNITS pt state that he is out of his medication.  [facility] [address] [phone](H)  [phone](C)

## 2019-04-26 ENCOUNTER — Other Ambulatory Visit: Payer: Self-pay | Admitting: *Deleted

## 2019-04-26 MED ORDER — NOVOLOG FLEXPEN 100 UNIT/ML ~~LOC~~ SOPN: PEN_INJECTOR | SUBCUTANEOUS | 0 refills | Status: DC

## 2019-04-26 NOTE — Telephone Encounter (Signed)
Rx sent to pharmacy as requested.

## 2019-05-10 ENCOUNTER — Other Ambulatory Visit: Payer: Self-pay | Admitting: Family Medicine

## 2019-05-14 ENCOUNTER — Other Ambulatory Visit: Payer: Self-pay | Admitting: Family Medicine

## 2019-05-15 ENCOUNTER — Encounter: Payer: Self-pay | Admitting: Family Medicine

## 2019-05-15 ENCOUNTER — Ambulatory Visit (INDEPENDENT_AMBULATORY_CARE_PROVIDER_SITE_OTHER): Payer: HMO | Admitting: Family Medicine

## 2019-05-15 ENCOUNTER — Other Ambulatory Visit: Payer: Self-pay

## 2019-05-15 VITALS — BP 140/70 | HR 62 | Temp 97.6°F | Ht 72.0 in | Wt 216.6 lb

## 2019-05-15 DIAGNOSIS — M1A9XX0 Chronic gout, unspecified, without tophus (tophi): Secondary | ICD-10-CM

## 2019-05-15 DIAGNOSIS — E1169 Type 2 diabetes mellitus with other specified complication: Secondary | ICD-10-CM

## 2019-05-15 DIAGNOSIS — Z23 Encounter for immunization: Secondary | ICD-10-CM | POA: Diagnosis not present

## 2019-05-15 DIAGNOSIS — Z794 Long term (current) use of insulin: Secondary | ICD-10-CM | POA: Diagnosis not present

## 2019-05-15 DIAGNOSIS — I152 Hypertension secondary to endocrine disorders: Secondary | ICD-10-CM

## 2019-05-15 DIAGNOSIS — Z1159 Encounter for screening for other viral diseases: Secondary | ICD-10-CM | POA: Diagnosis not present

## 2019-05-15 DIAGNOSIS — Z0000 Encounter for general adult medical examination without abnormal findings: Secondary | ICD-10-CM | POA: Diagnosis not present

## 2019-05-15 DIAGNOSIS — Z9189 Other specified personal risk factors, not elsewhere classified: Secondary | ICD-10-CM

## 2019-05-15 DIAGNOSIS — E7800 Pure hypercholesterolemia, unspecified: Secondary | ICD-10-CM | POA: Diagnosis not present

## 2019-05-15 DIAGNOSIS — E1159 Type 2 diabetes mellitus with other circulatory complications: Secondary | ICD-10-CM | POA: Diagnosis not present

## 2019-05-15 DIAGNOSIS — I10 Essential (primary) hypertension: Secondary | ICD-10-CM | POA: Diagnosis not present

## 2019-05-15 MED ORDER — NOVOLOG FLEXPEN 100 UNIT/ML ~~LOC~~ SOPN: PEN_INJECTOR | SUBCUTANEOUS | 0 refills | Status: DC

## 2019-05-15 MED ORDER — TRESIBA FLEXTOUCH 100 UNIT/ML ~~LOC~~ SOPN: PEN_INJECTOR | SUBCUTANEOUS | 3 refills | Status: DC

## 2019-05-15 NOTE — Progress Notes (Signed)
HPI:  Chief Complaint  Patient presents with  . Follow-up  . Medicare Wellness    [name] is a 74 y.o. male, who is here today for chronic disease management and AWV Last seen on 06/19/19. No new problems since his last OV.  Last AWV 01/01/18.   He lives with alone.. Independent ADL's and IADL's. No falls in the past year and denies depression symptoms. Sexually active, he wears condoms.  Functional Status Survey: Is the patient deaf or have difficulty hearing?: No Does the patient have difficulty seeing, even when wearing glasses/contacts?: No Does the patient have difficulty concentrating, remembering, or making decisions?: No Does the patient have difficulty walking or climbing stairs?: No Does the patient have difficulty dressing or bathing?: No Does the patient have difficulty doing errands alone such as visiting a doctor's office or shopping?: No  Fall Risk  05/15/2019 01/01/2018 01/01/2016 08/26/2015 04/16/2014  Falls in the past year? 0 No No No No  Number falls in past yr: 0 - - - -  Injury with Fall? 0 - - - -  Follow up Education provided - - - -   Providers he sees regularly: Eye care provider: Dr Satira Wilcox. Cataract surgery in 05/2018. Endocrinologist, Patrick Wilcox, last seen 06/2018.  Colonoscopy: 11/19/08.   Depression screen Patrick Wilcox 2/9 05/15/2019  Decreased Interest 0  Down, Depressed, Hopeless 0  PHQ - 2 Score 0    Mini-Cog - 05/15/19 2209    Normal clock drawing test?  yes    How many words correct?  3        Hearing Screening   125Hz 250Hz 500Hz 1000Hz 2000Hz 3000Hz 4000Hz 6000Hz 8000Hz  Right ear:           Left ear:             Visual Acuity Screening   Right eye Left eye Both eyes  Without correction: 20/25 20/25 20/25  With correction:        HTN: He is on Metoprolol Tartrate 100 mg bid, Ramipril 10 mg daily,and amlodipine 10 mg daily. He is not checking BP at home. Denies severe/frequent headache, visual changes, chest pain,  dyspnea, palpitation, claudication, focal weakness, or edema.   Lab Results  Component Value Date   CREATININE 0.75 06/19/2018   BUN 9 06/19/2018   NA 137 06/19/2018   K 4.2 06/19/2018   CL 102 06/19/2018   CO2 28 06/19/2018    HLD: Currently on Atorvastatin 40 mg daily. He is not consistent with following a low fat diet.  Lab Results  Component Value Date   CHOL 111 03/16/2018   HDL 30.40 (L) 03/16/2018   LDLCALC 60 03/16/2018   TRIG 100.0 03/16/2018   CHOLHDL 4 03/16/2018   DM II: Dx'ed in 2008. He sees Patrick Wilcox, has not seeing him in several months. He would like HgA1C check.  He is on [place] , taking 40 U daily and Novolog 22 U after supper. No hypoglycemic events. 76-130's, fasting glucose. Denies polydipsia,polyuria, or polyphagia.   No feet burning ,tingling,or numbness.  Lab Results  Component Value Date   HGBA1C 7.1 (A) 06/19/2018   Gout: No flare ups in about 5-6 years. Currently he is on Allopurinol 300 mg daily.   Review of Systems  Constitutional: Negative for activity change, appetite change, fatigue and fever.  HENT: Negative for mouth sores, nosebleeds and trouble swallowing.   Eyes: Negative for redness and visual disturbance.  Respiratory: Negative for  cough and wheezing.  Gastrointestinal: Negative for abdominal pain, nausea and vomiting.       Negative for changes in bowel habits.  Endocrine: Negative.   Genitourinary: Negative for decreased urine volume, difficulty urinating, dysuria and hematuria.  Musculoskeletal: Negative for gait problem and myalgias.  Skin: Negative for color change and rash.  Neurological: Negative for syncope, facial asymmetry,and speech difficulties. Psychiatric/Behavioral: Negative for anxiety and derpession.   Rest of ROS pertinent positive and negative in HPI.  Current Outpatient Medications on File Prior to Visit  Medication Sig Dispense Refill  . Alcohol Swabs (B-D SINGLE USE SWABS REGULAR) PADS Use as  necessary 300 each 6  . allopurinol (ZYLOPRIM) 300 MG tablet TAKE 1 TABLET BY MOUTH EVERY DAY 90 tablet 1  . amLODipine (NORVASC) 10 MG tablet TAKE 1 TABLET BY MOUTH EVERY DAY 90 tablet 1  . aspirin 81 MG tablet Take 81 mg by mouth daily.    Marland Kitchen atorvastatin (LIPITOR) 40 MG tablet TAKE 1 TABLET BY MOUTH EVERY DAY 30 tablet 0  . Blood Glucose Monitoring Suppl (ONETOUCH VERIO IQ SYSTEM) w/Device KIT USE TO CHECK BLOOD SUGAR TWICE A DAY DX CODE E11.9 1 kit 2  . colchicine (COLCRYS) 0.6 MG tablet take 1 tablet by mouth once daily 90 tablet 3  . EASY COMFORT PEN NEEDLES 31G X 5 MM MISC Used to check  Blood glucose 3 times daily or prn. 100 each 3  . furosemide (LASIX) 20 MG tablet Take 1 tablet (20 mg total) by mouth daily. 90 tablet 1  . glucose blood (ONETOUCH VERIO) test strip Use as instructed 200 each 1  . metFORMIN (GLUCOPHAGE) 1000 MG tablet TAKE ONE TABLET BY MOUTH TWICE DAILY. TAKE WITH A MEAL. 180 tablet 1  . metoprolol tartrate (LOPRESSOR) 100 MG tablet TAKE 1 TABLET BY MOUTH TWICE A DAY 180 tablet 1  . nystatin-triamcinolone ointment (MYCOLOG) Apply topically 2 (two) times daily. 60 g 0  . ONETOUCH DELICA LANCETS 01U MISC Use to check blood sugar twice daily. DX Code E11.9 200 each 1  . ramipril (ALTACE) 10 MG capsule Take 1 capsule (10 mg total) by mouth 2 (two) times daily. 180 capsule 1  . sildenafil (VIAGRA) 50 MG tablet Take 1 tablet (50 mg total) by mouth daily as needed for erectile dysfunction. 15 tablet 4   No current facility-administered medications on file prior to visit.     Past Medical History:  Diagnosis Date  . Cataract    had surgery to remove them  . CORONARY ARTERY DISEASE 11/23/2006  . DIABETES MELLITUS, TYPE II 11/23/2006  . GOUT 05/20/2008  . HYPERLIPIDEMIA 11/23/2006  . HYPERTENSION 11/23/2006  . 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 PNEUMONIA, LEFT LOWER LOBE 09/08/2009    No Known Allergies  Social History   Socioeconomic History  . Marital status:  Widowed    Spouse name: Not on file  . Number of children: 2  . Years of education: Not on file  . Highest education level: Not on file  Occupational History    Comment: Retired from [employer]  . Financial resource strain: Not hard at all  . Food insecurity    Worry: Never true    Inability: Never true  . Transportation needs    Medical: No    Non-medical: No  Tobacco Use  . Smoking status: Never Smoker  . Smokeless tobacco: Never Used  Substance and Sexual Activity  . Alcohol use: No    Comment:  stopped drinking etoh completely in 08-31-1999  . Drug use: No  . Sexual activity: Yes    Comment: uses viagra  Lifestyle  . Physical activity    Days per week: 7 days    Minutes per session: 30 min  . Stress: Only a little  Relationships  . Social connections    Talks on phone: More than three times a week    Gets together: Once a week    Attends religious service: More than 4 times per year    Active member of club or organization: Yes    Attends meetings of clubs or organizations: More than 4 times per year    Relationship status: Widowed  Other Topics Concern  . Not on file  Social History Narrative   Lives alone   Retired from [employer] in 08-31-1999   Twin sons, one son died of a stroke this year 30-Aug-2018) at age 38, his living son resides in [place]    His wife died of breast cancer 11 years ago     Today's Vitals   05/15/19 1000  BP: 140/70  Pulse: 62  Temp: 97.6 F (36.4 C)  TempSrc: Temporal  SpO2: 98%  Weight: 216 lb 9.6 oz (98.2 kg)  Height: 6' (1.829 m)    Body mass index is 29.38 kg/m.   Physical Exam  Nursing note and vitals reviewed. Constitutional: She is oriented to person, place, and time. She appears well-developed. No distress.  HENT:  Head: Normocephalic and atraumatic.  Mouth/Throat: Oropharynx is clear and moist and mucous membranes are normal.  Eyes: Pupils are equal, round, and reactive to light. Conjunctivae are normal.   Cardiovascular: Normal rate and regular rhythm.  Soft SEM murmur heard RUSB. Pulses:      Dorsalis pedis pulses are 2+ on the right side, and 2+ on the left side.  Respiratory: Effort normal and breath sounds normal. No respiratory distress.  GI: Soft. She exhibits no mass. There is no hepatomegaly. There is no tenderness.  Musculoskeletal: She exhibits no edema.  Lymphadenopathy:    She has no cervical adenopathy.  Neurological: She is alert and oriented to person, place, and time. She has normal strength. No cranial nerve deficit. Gait normal.  Skin: Skin is warm. No rash noted. No erythema.  Psychiatric: She has a normal mood and affect.  Well groomed, good eye contact.    ASSESSMENT AND PLAN:  [name] was seen today for follow-up and medicare wellness.  Diagnoses and all orders for this visit:  Orders Placed This Encounter  Procedures  . Flu Vaccine QUAD High Dose(Fluad)  . Microalbumin / creatinine urine ratio  . Lipid panel  . Comprehensive metabolic panel  . Hemoglobin A1c  . Fructosamine  . Uric acid  . Hepatitis C antibody    Medicare annual wellness visit, subsequent We discussed the importance of staying active, physically and mentally, as well as the benefits of a healthy/balnace diet. Low impact exercise that involve stretching and strengthing are ideal. Vaccines up to date. We discussed preventive screening for the next 5-10 years, summery of recommendations given in AVS. Fall prevention.  Advance directives and end of life discussed, he does not have POA or living will. Recommend considering planning for end of life, advance directive package given.   Chronic gout without tophus, unspecified cause, unspecified site Well controlled. He does not want to decrease Allopurinol dose for now. Some side effects of medication discussed.  Hypertension associated with diabetes ([place]) BP otherwise adequately  controlled. No changes in current management.  Recommend monitor BP at home. Continue low salt diet.  Pure hypercholesterolemia No changes in current management, will follow labs done today and will give further recommendations accordingly.  Type 2 diabetes mellitus with other specified complication, with long-term current use of insulin (HCC) Problem seems to be well controlled based on BS's numbers. No changes in current management. Regular exercise and healthy diet with avoidance of added sugar food intake is an important part of treatment and recommended. Annual eye exam, periodic dental and foot care recommended. Continue following with Patrick Wilcox.  -     insulin degludec (TRESIBA FLEXTOUCH) 100 UNIT/ML SOPN FlexTouch Pen; 40 UNITS DAILY WITH SUPPER, IF AM SUGARS STAYING < 90 IN AM REDUCE TRESIBA TO 38 UNITS 1500/40=38 -     insulin aspart (NOVOLOG FLEXPEN) 100 UNIT/ML FlexPen; INJECT 20-22 UNITS INTO SKIN PRIOR TO BREAKFAST AND 20-22 UNITS PRIOR TO EVENING MEAL *1500/44=35*  Encounter for HCV screening test for high risk patient -     Hepatitis C antibody  Need for immunization against influenza -     Flu Vaccine QUAD High Dose(Fluad)   Return in about 6 months (around 11/12/2019) for htn.   Nohely Whitehorn [name], MD [facility]. [place] office.

## 2019-05-15 NOTE — Patient Instructions (Addendum)
  [name] , Thank you for taking time to come for your Medicare Wellness Visit. I appreciate your ongoing commitment to your health goals. Please review the following plan we discussed and let me know if I can assist you in the future.   These are the goals we discussed: Arrange eye exam and colonoscopy.   Goals    .  Acknowledge receipt of Advanced Directive package     Reviewed purpose and importance of advance directive and health care power of attorney Mailed another advance directive package with educational handout on advance directive    . Client understands the importance of follow-up with providers by attending scheduled visits    . Client will report abillity to obtain Medications within 5 months     Referred to pharmacy for copay and gap coverage assistance    . Client will report no worsening of symptoms related to heart disease within the next 5 months    . Client will verbalize knowledge of self management of Hypertension as evidences by BP reading of 140/90 or less; or as defined by provider     Mailed handout on how to take blood pressure at home    . HEMOGLOBIN A1C < 7.0     Reviewed targets for Hgb A1C and pre and post meal blood sugar Mailed educational handout on how to rotate insulin injection sites Mailed information on the [employer] flash glucose monitoring system and reviewed insurance plan coverage of the system. Encouraged him to discuss with Dr Dwyane Dee at next office visit.     . Maintain timely refills of diabetic medication as prescribed within the year .    Marland Kitchen Obtain annual  Lipid Profile, LDL-C    . Obtain Annual Eye (retinal)  Exam     . Obtain Annual Foot Exam    . Obtain annual screen for micro albuminuria (urine) , nephropathy (kidney problems)    . Obtain Hemoglobin A1C at least 2 times per year    . Visit Primary Care Provider or Endocrinologist at least 2 times per year        This is a list of the screening recommended for you and due  dates:  Health Maintenance  Topic Date Due  .  Hepatitis C: One time screening is recommended by Center for Disease Control  (CDC) for  adults born from [birth year] through 1965.   1946/06/22  . Eye exam for diabetics  10/10/2018  . Colon Cancer Screening  11/20/2018  . Hemoglobin A1C  12/19/2018  . Complete foot exam   01/02/2019  . Flu Shot  02/09/2019  . Tetanus Vaccine  08/03/2023  . Pneumonia vaccines  Completed   A few things to remember from today's visit:   Chronic gout without tophus, unspecified cause, unspecified site - Plan: Uric acid  Hypertension associated with diabetes ([place])  Pure hypercholesterolemia - Plan: Lipid panel, Comprehensive metabolic panel  Medicare annual wellness visit, subsequent  Type 2 diabetes mellitus with other specified complication, with long-term current use of insulin ([place]) - Plan: Microalbumin / creatinine urine ratio, Comprehensive metabolic panel, Hemoglobin A1c, Fructosamine  Encounter for HCV screening test for high risk patient - Plan: Hepatitis C antibody  Remember Novolog 10-15 min before meals no after.  Please be sure medication list is accurate. If a new problem present, please set up appointment sooner than planned today.

## 2019-05-21 ENCOUNTER — Other Ambulatory Visit: Payer: Self-pay | Admitting: Family Medicine

## 2019-05-21 DIAGNOSIS — Z794 Long term (current) use of insulin: Secondary | ICD-10-CM

## 2019-05-21 DIAGNOSIS — E1169 Type 2 diabetes mellitus with other specified complication: Secondary | ICD-10-CM

## 2019-05-24 ENCOUNTER — Other Ambulatory Visit: Payer: Self-pay | Admitting: Family Medicine

## 2019-05-30 ENCOUNTER — Ambulatory Visit: Payer: Self-pay | Admitting: *Deleted

## 2019-06-06 ENCOUNTER — Other Ambulatory Visit: Payer: Self-pay | Admitting: Family Medicine

## 2019-06-13 ENCOUNTER — Telehealth: Payer: Self-pay | Admitting: Family Medicine

## 2019-06-13 NOTE — Telephone Encounter (Signed)
Copied from [place] [id number]. Topic: General - Other >> Jun 13, 2019  1:05 PM Rainey Pines A wrote: Patient would like a callback to speak with nurse in regards to questions about his novolog Best contact [phone]

## 2019-06-17 NOTE — Telephone Encounter (Signed)
I called and spoke with pt's spouse. She will call back when she is with pt.

## 2019-06-26 ENCOUNTER — Ambulatory Visit: Payer: Self-pay | Admitting: *Deleted

## 2019-06-26 ENCOUNTER — Other Ambulatory Visit: Payer: Self-pay | Admitting: *Deleted

## 2019-06-26 NOTE — Patient Outreach (Signed)
  [facility]) Care Management Chronic Special Needs Program    06/26/2019  Name: Patrick Wilcox, DOB: 19-Nov-1945  MRN: [id number]   [name] is enrolled in a chronic special needs plan for Diabetes. Attempted to reach client via mobile and home number to complete 6 month follow up assessment; unable to leave message on either number.  Plan: Will attempt another outreach within one week.  Kelli Churn RN, CCM, [place] Management Coordinator [facility] Care Management [phone]

## 2019-07-01 ENCOUNTER — Ambulatory Visit: Payer: Self-pay | Admitting: *Deleted

## 2019-07-01 ENCOUNTER — Other Ambulatory Visit: Payer: Self-pay | Admitting: *Deleted

## 2019-07-01 NOTE — Patient Outreach (Addendum)
  [facility]) Care Management Chronic Special Needs Program    07/01/2019  Name: Patrick Wilcox, DOB: 1945/11/17  MRN: [id number]   [name] is enrolled in a chronic special needs plan for Diabetes. Reached client via mobile number, purpose of call explained. Per client request, scheduled follow up assessment for 12/28 at 1:00 pm  Plan: Will make outreach on 12/28 to complete follow up assessment.  Kelli Churn RN, CCM, [place] Management Coordinator [facility] Care Management [phone]

## 2019-07-04 ENCOUNTER — Other Ambulatory Visit: Payer: Self-pay | Admitting: Family Medicine

## 2019-07-08 ENCOUNTER — Encounter: Payer: Self-pay | Admitting: *Deleted

## 2019-07-08 ENCOUNTER — Other Ambulatory Visit: Payer: Self-pay | Admitting: *Deleted

## 2019-07-08 DIAGNOSIS — Z794 Long term (current) use of insulin: Secondary | ICD-10-CM

## 2019-07-08 DIAGNOSIS — E1169 Type 2 diabetes mellitus with other specified complication: Secondary | ICD-10-CM

## 2019-07-08 NOTE — Patient Outreach (Deleted)
[facility] [place]) Care Management Chronic Special Needs Program  07/08/2019  Name: Patrick Wilcox DOB: [birth year]  MRN: [id number]  [name] is enrolled in a chronic special needs plan for Diabetes. Reviewed and updated care plan.  Subjective: [name] states he has remained safe from Covid as he only goes out for provider appointments, haircuts, nail care and the grocery store. He says he always wears a mask and gloves when he leaves his home. He says he received a flu shot in November and will take the Covid vaccine when it is available to him.  He reports his most urgent concern is the expense of his insulin copays as he is in the coverage gap. He says the cost for his insulins, Tresiba and Novolog, more than doubled and he is struggling to afford them. He says he completed the medication assistance forms about 2 weeks ago and mailed them back to the [place] office. He says he continues to check his blood sugar daily, either in the morning, or if he forgets, he checks in the afternoon. He reports his fasting variance as 96-130.  He says he saw Dr. [name] in early November but states he did not have blood work done at that time. When he was asked about the uncollected labs ordered on 11/4 that are showing in his electronic medical record, he stated he is overdue to see Dr. Dwyane Dee and will call to make an appointment.    He says the rash on his abdomen comes and goes and that he needs a refill on the Mycolog cream.  He says he continues to live independently (and alone) and is able to perform all ADL and IADLs without assistance..  Goals Addressed            This Visit's Progress   . COMPLETED:  Acknowledge receipt of Advanced Directive package   On track    Reviewed purpose and importance of advance directive and health care power of attorney Mailed another advance directive package with educational handout on advance directive 07/08/19 Client states he completed  advance directive forms     . Client understands the importance of follow-up with providers by attending scheduled visits   On track    Client states he saw his primary care provider in early November and will call to make an appointment with Dr. Dwyane Dee before the end of the year    . COMPLETED: Client verbalizes knowledge of Heart Attack self management skills within the next 9 months      . Client will report ability to obtain Medications within 5 months   Not on track    Referred to pharmacy for copay and gap coverage assistance 07/08/19 Client states he is still struggling to pay for his Novolog and [name]. He says he completed the medication assistance forms and mailed them back to [place] office about 2 weeks ago. Will make another referral to the [place] pharmacist.    . Client will report no worsening of symptoms related to heart disease within the next 5 months   On track    Client denies any problems related to his heart    . Client will verbalize knowledge of self management of Hypertension as evidences by BP reading of 140/90 or less; or as defined by provider       Mailed handout on how to take blood pressure at home 07/08/19 Client states he probably received the information in July on how to  take his blood pressure at home but at present he is more concerned about getting copay assistance with his insulins    . HEMOGLOBIN A1C < 7.0       12/28 reviewed self monitored CBGs values. Reviewed targets for Hgb A1C and pre and post meal blood sugar Mailed educational handout on how to rotate insulin injection sites Mailed information on the [employer] flash glucose monitoring system and reviewed insurance plan coverage of the system. Encouraged him to discuss with Dr Dwyane Dee at next office visit.     . Maintain timely refills of diabetic medication as prescribed within the year .   On track    Client states he refills medications on time and does not run out of medication     .  Obtain annual  Lipid Profile, LDL-C   Not on track    Most recent lipid profile was completed on 03/16/18    . Obtain Annual Eye (retinal)  Exam    Not on track    Most recent eye exam was completed on 05/24/18    . Obtain Annual Foot Exam       Diabetic foot exam was completed on 05/15/19    . Obtain annual screen for micro albuminuria (urine) , nephropathy (kidney problems)       Most recent urine for microalbumin was completed on 03/16/18    . Obtain Hemoglobin A1C at least 2 times per year       Most recent Hgb A1C was completed on 06/19/18    . Visit Primary Care Provider or Endocrinologist at least 2 times per year        Annual wellness exam was completed with primary care provider on 05/15/19 Client saw Dr. Dwyane Dee (endocrinologist) on 06/29/19      Assessment: Client is not meeting diabetes self-management goal of hemoglobin A1C of <7% with most recent reading of 7.1 % on 06/19/18  without reports of hypoglycemia . Client has good understanding of:  COVID-19 cause, symptoms, precautions (social distancing, stay at home order, hand washing, wear face covering when unable to maintain or ensure 6 foot social distancing), and symptoms requiring provider notification.  Plan:   Will contact Dr [name] to clarify if client should be directed to have labs that were ordered on 11/4.  Send successful outreach letter with a copy of their individualized care plan and Send individual care plan to provider Chronic care management coordinator will outreach in:  6 Months Will refer to:  Pharmacy for insulin copay assistance while in the coverage gap.     Kelli Churn RN, CCM, [place] Management Coordinator [facility] Care Management [phone] .

## 2019-07-09 ENCOUNTER — Telehealth: Payer: Self-pay

## 2019-07-09 ENCOUNTER — Telehealth: Payer: Self-pay | Admitting: Pharmacist

## 2019-07-09 NOTE — Telephone Encounter (Signed)
-----   Message from [facility] sent at 07/08/2019  4:53 PM EST ----- Regarding: Comm to [place],   I'm covering for [place], her Order queue is messing up that is why I am sending this in a message.     Please call Mr. Gurka, See message below. Thank you   ===View-only below this line=== ----- Message ----- From: Barrington Ellison, RN Sent: 07/08/2019   3:54 PM EST To: Thn Cm Communication Orders Subject: Order for [place]                       Patient Name: Patrick Wilcox, SHAPIRO [id number]) Sex: Male DOB: 1946-03-12    PCP: [name], [place]: [place]   Types of orders made on 07/08/2019: Nursing  Order Date:07/08/2019 Ordering User:HAUSER, Trude Mcburney [id number] Encounter Provider:Hauser, Trude Mcburney, RN [phone] Authorizing Provider:  [name], [name], MD [phone] Department:[facility]-LINK TO [place]  Order Specific Information Order: Comm to Pharmacy [Custom: [id number]  Order #: [id number] Qty: 1   Priority: Routine  Class: Clinic Performed   Comment:C-SNP DM client.             Sharee Pimple and Alwyn Ren have been working with him.            I completed a follow up assessment today and he states he completed             the med ass itance forms and mailed them back to our office 2 weeks             ago. He says he is almost out of novolog and he is struggling to pay             for the novolog and [name] because he is in the coverage gap.            Thank you.            Aundria Rud.   Associated Diagnoses     E11.69, Z79.4 Type 2 diabetes mellitus with other specified complication,      with long-term current use of insulin ([place])     Reason for Consult -> Medication A ssistance       Priority: Routine  Class: Clinic Performed   Comment:C-SNP DM client.             Sharee Pimple and Alwyn Ren have been working with him.            I completed a follow up assessment today and he states he completed             the med assitance forms and mailed them  back to our office 2 weeks             ago. He says he is almost out of novolog and he is struggling to pay             for the novolog and [name] b ecause he is in the coverage gap.            Thank you.            Aundria Rud.   Associated Diagnoses     E11.69, Z79.4 Type 2 diabetes mellitus with other specified complication,      with long-term current use of insulin ([place])     Reason for Consult -> Medication Assistance

## 2019-07-09 NOTE — Telephone Encounter (Signed)
Copied from [place] [id number]. Topic: General - Other >> Jul 09, 2019 11:01 AM Keene Breath wrote: Reason for CRM: Called to inform the doctor that she sent an in basket message to the doctor regarding the patient.  CB#  [phone]

## 2019-07-09 NOTE — Patient Outreach (Signed)
[facility]) Care Management  07/09/2019  Patrick Wilcox Mar 17, 1946 [id number]  Called patient to further discuss patient assistance. HIPAA identifiers were obtained. We have been attempting to assist the patient with obtaining [name] and Novolog from [employer].  Patient was sent a free immediate coupon in July.  Jill Simcox, CPhT said we received the patient's papework today. However, we need the most recent FEDERAL tax return. The patient sent the [facility] return.  This was explained to the patient and he said he would mail back the appropriate documentation.  Plan: Route note to Sharee Pimple Simcox to send patient a return address envelope for the patient to send the appropriate tax return back to the [facility] office.  Follow up in 4-6 weeks.   Elayne Guerin, PharmD, [place] Clinical Pharmacist [phone]

## 2019-07-10 ENCOUNTER — Other Ambulatory Visit: Payer: Self-pay | Admitting: Pharmacy Technician

## 2019-07-10 NOTE — Patient Outreach (Signed)
[place] [facility]) Care Management Chronic Special Needs Program  07/08/2019  Name: Patrick Wilcox DOB: [birth year]  MRN: [id number]  Patrick Wilcox is enrolled in a chronic special needs plan for Diabetes. Reviewed and updated care plan.  Subjective: Patrick Wilcox states he has remained safe from Covid as he only goes out for provider appointments, haircuts, nail care and the grocery store. He says he always wears a mask and gloves when he leaves his home. He says he received a flu shot in November and will take the Covid vaccine when it is available to him.  He reports his most urgent concern is the expense of his insulin copays as he is in the coverage gap. He says the cost for his insulins, Tresiba and Novolog, more than doubled and he is struggling to afford them. He says he completed the medication assistance forms about 2 weeks ago and mailed them back to the [place] office. He says he continues to check his blood sugar daily, either in the morning, or if he forgets, he checks in the afternoon. He reports his fasting variance as 96-130.  He says he saw Dr. [name] in early November but states he did not have blood work done at that time. When he was asked about the uncollected labs ordered on 11/4 that are showing in his electronic medical record, he stated he is overdue to see Patrick Wilcox and will call to make an appointment.    He says the rash on his abdomen comes and goes and that he needs a refill on the Mycolog cream.  He says he continues to live independently (and alone) and is able to perform all ADL and IADLs without assistance..  Goals Addressed            This Visit's Progress   . COMPLETED:  Acknowledge receipt of Advanced Directive package   On track    Reviewed purpose and importance of advance directive and health care power of attorney Mailed another advance directive package with educational handout on advance directive 07/08/19 Client states he completed  advance directive forms     . COMPLETED: Advanced Care Planning complete by 07/10/19   On track    07/08/19 Client states he completed advance directive forms    . Client understands the importance of follow-up with providers by attending scheduled visits   On track    Client states he saw his primary care provider in early November and will call to make an appointment with Patrick Wilcox before the end of the year    . COMPLETED: Client verbalizes knowledge of Heart Attack self management skills within the next 9 months      . Client will report ability to obtain Medications within 5 months   Not on track    Referred to pharmacy for copay and gap coverage assistance 07/08/19 Client states he is still struggling to pay for his Novolog and [name]. He says he completed the medication assistance forms and mailed them back to [place] office about 2 weeks ago. Will make another referral to the [place] pharmacist.    . Client will report no worsening of symptoms related to heart disease within the next 5 months   On track    Client denies any problems related to his heart    . Client will verbalize knowledge of self management of Hypertension as evidences by BP reading of 140/90 or less; or as defined by provider  Mailed handout on how to take blood pressure at home 07/08/19 Client states he probably received the information in July on how to take his blood pressure at home but at present he is more concerned about getting copay assistance with his insulins    . HEMOGLOBIN A1C < 7.0       12/28 reviewed self monitored CBGs values Reviewed targets for Hgb A1C and pre and post meal blood sugar Reviewed importance of rotating insulin injection sites Reminded client to discuss option of Freestyle Libre flash glucose monitoring system (educational information was mailed to client in July) with Dr Dwyane Wilcox at next office visit.     . Maintain timely refills of diabetic medication as prescribed within the  year .   On track    Client states he refills medications on time and does not run out of medication     . Obtain annual  Lipid Profile, LDL-C   Not on track    Most recent lipid profile was completed on 03/16/18 Discussed uncollected labs ordered 11/4 with client and In Basket message sent to Dr Patrick Wilcox and message left with her office that 11/4 labs show status of uncollected in electronic medical record    . Obtain Annual Eye (retinal)  Exam    Not on track    Most recent eye exam was completed on 05/24/18 Reminded patient of importance of completing annual diabetic eye exam    . Obtain Annual Foot Exam       Diabetic foot exam was completed on 05/15/19 Client denies current foot issues other than thick toenails    . Obtain annual screen for micro albuminuria (urine) , nephropathy (kidney problems)   Not on track    Most recent urine for microalbumin was completed on 03/16/18 Discussed uncollected labs ordered 11/4 with client and In Basket message sent to Dr Patrick Wilcox and message left with her office that 11/4 labs show status of uncollected in electronic medical record    . Obtain Hemoglobin A1C at least 2 times per year   Not on track    Most recent Hgb A1C was completed on 06/19/18 Discussed uncollected labs ordered 11/4 with client and In Basket message sent to Dr Patrick Wilcox and message left with her office that 11/4 labs show status of uncollected in electronic medical record    . Visit Primary Care Provider or Endocrinologist at least 2 times per year    Not on track    Annual wellness exam was completed with primary care provider on 05/15/19 Client saw Patrick Wilcox (endocrinologist) on 06/29/19,  07/08/19 Client says he will call to make an appointment with Dr .Dwyane Wilcox     Assessment: Client is not meeting diabetes self-management goal of hemoglobin A1C of <7% with most recent reading of 7.1 % on 06/19/18  without reports of hypoglycemia . Client has good understanding of:  COVID-19 cause,  symptoms, precautions (social distancing, stay at home order, hand washing, wear face covering when unable to maintain or ensure 6 foot social distancing), and symptoms requiring provider notification.  Plan:   Will contact Dr Patrick Wilcox to clarify if client should be directed to have labs that were ordered on 11/4.  Send successful outreach letter with a copy of their individualized care plan and Send individual care plan to provider Chronic care management coordinator will outreach in:  6 Months Will refer to:  Pharmacy for insulin copay assistance while in the coverage gap.   Kelli Churn RN, CCM, CDCES Chronic  Care Management Coordinator [place] Management [phone] .  Marland Kitchen

## 2019-07-10 NOTE — Patient Outreach (Signed)
[place] [facility]) Care Management  07/10/2019  FARHAAN CADA Mar 12, 1946 [name]                                        Medication Assistance Referral  Referral From: [employer]  Medication/Company: [name] and Novolog / [employer] Patient application portion:  Mailed return envelope for patient to mail in his proof of income. He sent in a [place] tax return and not a Federal return Provider application portion:  Mailed to [name] on 07/12/2019 Provider address/fax verified via: Office website  Note:we have been working with patient since July for his patient assistance.  Follow up:  Will follow up with patient in 5-10 business days to confirm application(s) have been received.  Lamel Mccarley P. Stephfon Bovey, [place] Management [phone]

## 2019-07-13 ENCOUNTER — Other Ambulatory Visit: Payer: Self-pay | Admitting: Family Medicine

## 2019-07-14 ENCOUNTER — Other Ambulatory Visit: Payer: Self-pay | Admitting: Family Medicine

## 2019-07-15 ENCOUNTER — Other Ambulatory Visit: Payer: Self-pay | Admitting: Family Medicine

## 2019-07-15 DIAGNOSIS — E1159 Type 2 diabetes mellitus with other circulatory complications: Secondary | ICD-10-CM

## 2019-07-16 NOTE — Telephone Encounter (Signed)
He is just requesting a refill on his Mycolog cream due to a rash from the insulin injections. They have educated him about doing the injections in different areas. They have also helped him fill out a patient assistance form for his insulin to be more affordable - I will fill out our part when it arrives.

## 2019-07-16 NOTE — Telephone Encounter (Signed)
Do you mind calling patient, it seems like he has some question about insulin. He may need follow-up appointment. Thanks, BJ

## 2019-07-19 NOTE — Telephone Encounter (Signed)
Response sent to [facility] via staff message as she is assisting pt.

## 2019-07-19 NOTE — Telephone Encounter (Signed)
Insulin injections may be causing some skin reaction. It is unlikely to be fungal, so I recommend OTC Hydrocortisone instead, tid as needed. Also switch injection site. We can address problem next visit. Thanks, BJ

## 2019-07-22 ENCOUNTER — Other Ambulatory Visit: Payer: Self-pay | Admitting: Pharmacy Technician

## 2019-07-22 NOTE — Patient Outreach (Signed)
Patrick Wilcox [facility]) Care Management  07/22/2019  Patrick Wilcox 1945-09-20 [id number]     Successful call placed to patient regarding patient assistance application(s) for [name] and Novolog with [employer] , HIPAA identifiers verified.   Patient informed that he would try and mail back his proof of income this week. He recalls receiving the return envelope to mail back the required information needed for application processing. Informed patient that was the only piece of missing information that was required of the patient assistance application process. Patient verbalized understanding.  Follow up:  Will route note to [place] for case closure if document(s) have not been received in the next 15 business days.  Patrick Wilcox, [place] Management [phone]

## 2019-07-24 ENCOUNTER — Ambulatory Visit: Payer: HMO | Admitting: Pharmacist

## 2019-08-06 ENCOUNTER — Other Ambulatory Visit: Payer: Self-pay | Admitting: Family Medicine

## 2019-08-06 DIAGNOSIS — E1169 Type 2 diabetes mellitus with other specified complication: Secondary | ICD-10-CM

## 2019-08-27 ENCOUNTER — Telehealth: Payer: Self-pay | Admitting: Family Medicine

## 2019-08-28 ENCOUNTER — Other Ambulatory Visit: Payer: Self-pay | Admitting: Pharmacist

## 2019-08-28 ENCOUNTER — Telehealth: Payer: Self-pay

## 2019-08-28 NOTE — Telephone Encounter (Signed)
The patient is scheduled for fasting labs on 03/01 at 10 AM

## 2019-08-28 NOTE — Telephone Encounter (Signed)
Pt had labs ordered back in 05/2019 when he was here in office, but they weren't drawn. Can you schedule a lab appt? Thanks!

## 2019-08-28 NOTE — Patient Outreach (Signed)
[place] [facility]) Care Management  08/28/2019  Patrick Wilcox [birth year] [id number]   Patient was called for medication assistance and CSNP follow up. HIPAA identifiers were obtained.  Patient is a 74 year old male with multiple medical conditions including but not limited to:  Hypertension, gout, history of MI, coronary atherosclerosis, hyperlipidemia, and type 2 diabetes.  Reviewed the patient's Medicare Part D benefit.   Reminded patient to send in the medication assistance applications that were sent to him.   Reviewed his medications via telephone: Medications Reviewed Today    Reviewed by Elayne Guerin, [facility] (Pharmacist) on 08/28/19 at 1341  Med List Status: <None>  Medication Order Taking? Sig Documenting Provider Last Dose Status Informant  Alcohol Swabs (B-D SINGLE USE SWABS REGULAR) PADS [id number] Yes Use as necessary Marletta Lor, MD Taking Active   allopurinol (ZYLOPRIM) 300 MG tablet [id number] Yes TAKE 1 TABLET BY MOUTH EVERY DAY [name], [name], MD Taking Active   amLODipine (NORVASC) 10 MG tablet [id number] Yes TAKE 1 TABLET BY MOUTH EVERY DAY [name], [name], MD Taking Active   aspirin 81 MG tablet [id number] Yes Take 81 mg by mouth daily. [provider] Taking Active   atorvastatin (LIPITOR) 40 MG tablet [id number] Yes TAKE 1 TABLET BY MOUTH EVERY DAY [name], [name], MD Taking Active   Blood Glucose Monitoring Suppl [facility] VERIO IQ SYSTEM) w/Device Drucie Opitz [id number] Yes USE TO CHECK BLOOD SUGAR TWICE A DAY DX CODE E11.9 [name], MD Taking Active   colchicine (COLCRYS) 0.6 MG tablet [id number] Yes take 1 tablet by mouth once daily [name], [name], MD Taking Active   EASY COMFORT PEN NEEDLES 31G X 5 MM MISC [id number] Yes Used to check  Blood glucose 3 times daily or prn. Marletta Lor, MD Taking Active   furosemide (LASIX) 20 MG tablet [id number] Yes TAKE 1 TABLET BY MOUTH EVERY DAY [name], [name], MD Taking Active   insulin degludec  (TRESIBA FLEXTOUCH) 100 UNIT/ML SOPN FlexTouch Pen [id number] Yes 40 UNITS DAILY WITH SUPPER, IF AM SUGARS STAYING < 90 IN AM REDUCE TRESIBA TO 38 UNITS 1500/40=38 [name], [name], MD Taking Active   metFORMIN (GLUCOPHAGE) 1000 MG tablet [id number] Yes TAKE ONE TABLET BY MOUTH TWICE DAILY. TAKE WITH A MEAL. [name], [name], MD Taking Active   metoprolol tartrate (LOPRESSOR) 100 MG tablet [id number] Yes TAKE 1 TABLET BY MOUTH TWICE A DAY [name], [name], MD Taking Active   NOVOLOG FLEXPEN 100 UNIT/ML FlexPen [id number] Yes INJECT 20-22 UNITS INTO SKIN PRIOR TO BREAKFAST AND 20-22 UNITS PRIOR TO EVENING MEAL *1500/44=35* [name], [name], MD Taking Active   nystatin-triamcinolone ointment [facility]) [id number] Yes Apply topically 2 (two) times daily. Marletta Lor, MD Taking Active            Med Note Broadus John, Duwayne Heck Jul 08, 2019  2:42 PM) States he is out of this medication and needs a refill to use prn  [facility] LANCETS 42P MISC [id number] Yes Use to check blood sugar twice daily. DX Code E11.9 [name], MD Taking Active   [facility] VERIO test strip [id number] Yes USE AS INSTRUCTED [name], [name], MD Taking Active   ramipril (ALTACE) 10 MG capsule [id number] Yes TAKE 1 CAPSULE (10 MG TOTAL) BY MOUTH 2 (TWO) TIMES DAILY. [name], [name], MD Taking Active   sildenafil (VIAGRA) 50 MG tablet [id number] Yes Take 1 tablet (50 mg total) by mouth daily as needed for erectile  dysfunction. [name], [name], MD Taking Active          Findings: HgA1c-7.1@ 12/19-new labs scheduled for 09/09/2019 at 10am On statin-Atorvastatin  Plan: Patient said he would send the forms in ASAP.  Elayne Guerin, PharmD, [place] Clinical Pharmacist [phone]

## 2019-09-06 ENCOUNTER — Other Ambulatory Visit: Payer: Self-pay

## 2019-09-09 ENCOUNTER — Other Ambulatory Visit: Payer: Self-pay

## 2019-09-09 ENCOUNTER — Telehealth: Payer: Self-pay | Admitting: Family Medicine

## 2019-09-09 ENCOUNTER — Other Ambulatory Visit: Payer: HMO

## 2019-09-09 DIAGNOSIS — Z9189 Other specified personal risk factors, not elsewhere classified: Secondary | ICD-10-CM | POA: Diagnosis not present

## 2019-09-09 DIAGNOSIS — Z794 Long term (current) use of insulin: Secondary | ICD-10-CM | POA: Diagnosis not present

## 2019-09-09 DIAGNOSIS — E1169 Type 2 diabetes mellitus with other specified complication: Secondary | ICD-10-CM | POA: Diagnosis not present

## 2019-09-09 DIAGNOSIS — Z1159 Encounter for screening for other viral diseases: Secondary | ICD-10-CM | POA: Diagnosis not present

## 2019-09-09 LAB — COMPREHENSIVE METABOLIC PANEL
ALT: 22 U/L (ref 0–53)
AST: 21 U/L (ref 0–37)
Albumin: 4.3 g/dL (ref 3.5–5.2)
Alkaline Phosphatase: 119 U/L — ABNORMAL HIGH (ref 39–117)
BUN: 7 mg/dL (ref 6–23)
CO2: 31 mEq/L (ref 19–32)
Calcium: 9.9 mg/dL (ref 8.4–10.5)
Chloride: 101 mEq/L (ref 96–112)
Creatinine, Ser: 0.75 mg/dL (ref 0.40–1.50)
GFR: 123.22 mL/min (ref >60.00)
Glucose, Bld: 116 mg/dL — ABNORMAL HIGH (ref 70–99)
Potassium: 4.2 mEq/L (ref 3.5–5.1)
Sodium: 141 mEq/L (ref 135–145)
Total Bilirubin: 1.1 mg/dL (ref 0.2–1.2)
Total Protein: 7.6 g/dL (ref 6.0–8.3)

## 2019-09-09 LAB — LIPID PANEL
Cholesterol: 125 mg/dL (ref 0–200)
HDL: 31.4 mg/dL — ABNORMAL LOW (ref >39.00)
LDL Cholesterol: 71 mg/dL (ref 0–99)
NonHDL: 94.07
Total CHOL/HDL Ratio: 4
Triglycerides: 114 mg/dL (ref 0.0–149.0)
VLDL: 22.8 mg/dL (ref 0.0–40.0)

## 2019-09-09 LAB — HEMOGLOBIN A1C: Hgb A1c MFr Bld: 8.2 % — ABNORMAL HIGH (ref 4.6–6.5)

## 2019-09-09 LAB — MICROALBUMIN / CREATININE URINE RATIO
Creatinine,U: 151.4 mg/dL
Microalb Creat Ratio: 98.1 mg/g — ABNORMAL HIGH (ref 0.0–30.0)
Microalb, Ur: 148.4 mg/dL — ABNORMAL HIGH (ref 0.0–1.9)

## 2019-09-09 LAB — URIC ACID: Uric Acid, Serum: 4.4 mg/dL (ref 4.0–7.8)

## 2019-09-09 NOTE — Chronic Care Management (AMB) (Signed)
  Chronic Care Management   Note  09/09/2019 Name: HUBERT RAATZ MRN: [id number] DOB: 1946/01/18  CHELSEY KIMBERLEY is a 74 y.o. year old male who is a primary care patient of [name], [name], MD. I reached out to [name] by phone today in response to a referral sent by Mr. Shaft Corigliano Graeff's PCP, [name], [name], MD.   Mr. Cihlar was given information about Chronic Care Management services today including:  1. CCM service includes personalized support from designated clinical staff supervised by his physician, including individualized plan of care and coordination with other care providers 2. 24/7 contact phone numbers for assistance for urgent and routine care needs. 3. Service will only be billed when office clinical staff spend 20 minutes or more in a month to coordinate care. 4. Only one practitioner may furnish and bill the service in a calendar month. 5. The patient may stop CCM services at any time (effective at the end of the month) by phone call to the office staff. 6. The patient will be responsible for cost sharing (co-pay) of up to 20% of the service fee (after annual deductible is met).  Patient agreed to services and verbal consent obtained.   Follow up plan:   Raynicia Dukes [employer] Scheduler

## 2019-09-12 LAB — FRUCTOSAMINE: Fructosamine: 315 umol/L — ABNORMAL HIGH (ref 205–285)

## 2019-09-12 LAB — HEPATITIS C ANTIBODY
Hepatitis C Ab: NONREACTIVE
SIGNAL TO CUT-OFF: 0.02 (ref <1.00)

## 2019-09-16 ENCOUNTER — Telehealth: Payer: Self-pay

## 2019-09-16 NOTE — Telephone Encounter (Signed)
Pt is scheduled for March 15 at 11am

## 2019-09-16 NOTE — Telephone Encounter (Signed)
Dr. [name] would like to see pt to go over his results with him - can be telephone visit or in office, whichever is easier for him.

## 2019-09-20 ENCOUNTER — Telehealth: Payer: Self-pay

## 2019-09-20 DIAGNOSIS — I10 Essential (primary) hypertension: Secondary | ICD-10-CM

## 2019-09-20 DIAGNOSIS — E1169 Type 2 diabetes mellitus with other specified complication: Secondary | ICD-10-CM

## 2019-09-20 NOTE — Telephone Encounter (Signed)
I am requesting an ambulatory referral to CCM be placed for this patient. Referral will need to have 2 current diagnosis attached to it. Thank you!  

## 2019-09-23 ENCOUNTER — Other Ambulatory Visit: Payer: Self-pay

## 2019-09-23 ENCOUNTER — Ambulatory Visit (INDEPENDENT_AMBULATORY_CARE_PROVIDER_SITE_OTHER): Payer: HMO | Admitting: Family Medicine

## 2019-09-23 ENCOUNTER — Telehealth: Payer: HMO

## 2019-09-23 ENCOUNTER — Encounter: Payer: Self-pay | Admitting: Family Medicine

## 2019-09-23 VITALS — BP 136/70 | HR 70 | Resp 16 | Ht 72.0 in | Wt 224.0 lb

## 2019-09-23 DIAGNOSIS — E7800 Pure hypercholesterolemia, unspecified: Secondary | ICD-10-CM | POA: Diagnosis not present

## 2019-09-23 DIAGNOSIS — M1A9XX0 Chronic gout, unspecified, without tophus (tophi): Secondary | ICD-10-CM | POA: Diagnosis not present

## 2019-09-23 DIAGNOSIS — E1169 Type 2 diabetes mellitus with other specified complication: Secondary | ICD-10-CM | POA: Diagnosis not present

## 2019-09-23 DIAGNOSIS — I10 Essential (primary) hypertension: Secondary | ICD-10-CM

## 2019-09-23 DIAGNOSIS — Z794 Long term (current) use of insulin: Secondary | ICD-10-CM

## 2019-09-23 DIAGNOSIS — E1159 Type 2 diabetes mellitus with other circulatory complications: Secondary | ICD-10-CM | POA: Diagnosis not present

## 2019-09-23 DIAGNOSIS — R011 Cardiac murmur, unspecified: Secondary | ICD-10-CM | POA: Diagnosis not present

## 2019-09-23 NOTE — Progress Notes (Addendum)
HPI:  [name] is a 74 y.o. male, who is here today for chronic disease management.  He was last seen on 05/15/19. DM II: Dx'ed in 2004. He is on Novolog 22 U after meals, Tresiba 40 U at 10 pm (with Novolog 22 U ), and Metformin 1000 mg bid. 80's-low 110's. Max 140-150 depending of what he eats. Denies polydipsia,polyuria, or polyphagia.  He has not seen Dr Dwyane Dee for over a year.  Lab Results  Component Value Date   HGBA1C 8.2 (H) 09/09/2019   HTN: He is on Amlodipine 10 mg daily, Metoprolol Tartrate 100 mg bid, and Ramipril 10 mg bid. He is not checking BP regularly. Negative for severe/frequent headache, visual changes, chest pain, dyspnea, palpitation, focal weakness, or edema. + CAD. Lexiscan stress test 09/2015 negative.  Lab Results  Component Value Date   CREATININE 0.75 09/09/2019   BUN 7 09/09/2019   NA 141 09/09/2019   K 4.2 09/09/2019   CL 101 09/09/2019   CO2 31 09/09/2019   Gout: Last gout attack about 5-6 years ago. Taking Allopurinol 300 mg daily and colchicine 0.6 mg daily as needed.  Lab Results  Component Value Date   LABURIC 4.4 09/09/2019    HLD: He is on Atorvastatin 40 mg daily.  Tolerating med well.  Lab Results  Component Value Date   CHOL 125 09/09/2019   HDL 31.40 (L) 09/09/2019   LDLCALC 71 09/09/2019   TRIG 114.0 09/09/2019   CHOLHDL 4 09/09/2019   Review of Systems  Constitutional: Negative for activity change, appetite change, fatigue and fever.  HENT: Negative for nosebleeds and sore throat.   Respiratory: Negative for cough and wheezing.   Gastrointestinal: Negative for abdominal pain, nausea and vomiting.  Genitourinary: Negative for decreased urine volume, dysuria and hematuria.  Musculoskeletal: Negative for gait problem and myalgias.  Neurological: Negative for syncope, facial asymmetry and headaches.  Psychiatric/Behavioral: Negative for confusion.  Rest of ROS, see pertinent positives sand negatives  in HPI  Current Outpatient Medications on File Prior to Visit  Medication Sig Dispense Refill  . Alcohol Swabs (B-D SINGLE USE SWABS REGULAR) PADS Use as necessary 300 each 6  . allopurinol (ZYLOPRIM) 300 MG tablet TAKE 1 TABLET BY MOUTH EVERY DAY 90 tablet 1  . amLODipine (NORVASC) 10 MG tablet TAKE 1 TABLET BY MOUTH EVERY DAY 90 tablet 1  . aspirin 81 MG tablet Take 81 mg by mouth daily.    Marland Kitchen atorvastatin (LIPITOR) 40 MG tablet TAKE 1 TABLET BY MOUTH EVERY DAY 90 tablet 3  . Blood Glucose Monitoring Suppl (ONETOUCH VERIO IQ SYSTEM) w/Device KIT USE TO CHECK BLOOD SUGAR TWICE A DAY DX CODE E11.9 1 kit 2  . colchicine (COLCRYS) 0.6 MG tablet take 1 tablet by mouth once daily 90 tablet 3  . EASY COMFORT PEN NEEDLES 31G X 5 MM MISC Used to check  Blood glucose 3 times daily or prn. 100 each 3  . furosemide (LASIX) 20 MG tablet TAKE 1 TABLET BY MOUTH EVERY DAY 90 tablet 1  . insulin degludec (TRESIBA FLEXTOUCH) 100 UNIT/ML SOPN FlexTouch Pen 40 UNITS DAILY WITH SUPPER, IF AM SUGARS STAYING < 90 IN AM REDUCE TRESIBA TO 38 UNITS 1500/40=38 15 pen 3  . metFORMIN (GLUCOPHAGE) 1000 MG tablet TAKE ONE TABLET BY MOUTH TWICE DAILY. TAKE WITH A MEAL. 180 tablet 1  . metoprolol tartrate (LOPRESSOR) 100 MG tablet TAKE 1 TABLET BY MOUTH TWICE A DAY 180 tablet 2  .  NOVOLOG FLEXPEN 100 UNIT/ML FlexPen INJECT 20-22 UNITS INTO SKIN PRIOR TO BREAKFAST AND 20-22 UNITS PRIOR TO EVENING MEAL *1500/44=35* 15 mL 3  . nystatin-triamcinolone ointment (MYCOLOG) Apply topically 2 (two) times daily. 60 g 0  . ONETOUCH DELICA LANCETS 89H MISC Use to check blood sugar twice daily. DX Code E11.9 200 each 1  . ONETOUCH VERIO test strip USE AS INSTRUCTED 200 strip 1  . ramipril (ALTACE) 10 MG capsule TAKE 1 CAPSULE (10 MG TOTAL) BY MOUTH 2 (TWO) TIMES DAILY. 180 capsule 1  . sildenafil (VIAGRA) 50 MG tablet Take 1 tablet (50 mg total) by mouth daily as needed for erectile dysfunction. 15 tablet 4   No current  facility-administered medications on file prior to visit.   Past Medical History:  Diagnosis Date  . Cataract    had surgery to remove them  . CORONARY ARTERY DISEASE 11/23/2006  . DIABETES MELLITUS, TYPE II 11/23/2006  . GOUT 05/20/2008  . HYPERLIPIDEMIA 11/23/2006  . HYPERTENSION 11/23/2006  . 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 PNEUMONIA, LEFT LOWER LOBE 09/08/2009   No Known Allergies  Social History   Socioeconomic History  . Marital status: Widowed    Spouse name: Not on file  . Number of children: 2  . Years of education: Not on file  . Highest education level: Not on file  Occupational History    Comment: Retired from [employer]  . Smoking status: Never Smoker  . Smokeless tobacco: Never Used  Substance and Sexual Activity  . Alcohol use: No    Comment: stopped drinking etoh completely in 09-28-99  . Drug use: No  . Sexual activity: Yes    Comment: uses viagra  Other Topics Concern  . Not on file  Social History Narrative   Lives alone   Retired from [employer] in 09-28-1999   Twin sons, one son died of a stroke this year Sep 27, 2018) at age 77, his living son resides in [place]    His wife died of breast cancer 11 years ago    Social Determinants of Health   Financial Resource Strain: Low Risk   . Difficulty of Paying Living Expenses: Not hard at all  Food Insecurity: No Food Insecurity  . Worried About Charity fundraiser in the Last Year: Never true  . Ran Out of Food in the Last Year: Never true  Transportation Needs: No Transportation Needs  . Lack of Transportation (Medical): No  . Lack of Transportation (Non-Medical): No  Physical Activity: Sufficiently Active  . Days of Exercise per Week: 7 days  . Minutes of Exercise per Session: 30 min  Stress: No Stress Concern Present  . Feeling of Stress : Only a little  Social Connections: Slightly Isolated  . Frequency of Communication with Friends and Family: More than three times a week  . Frequency  of Social Gatherings with Friends and Family: Once a week  . Attends Religious Services: More than 4 times per year  . Active Member of Clubs or Organizations: Yes  . Attends Archivist Meetings: More than 4 times per year  . Marital Status: Widowed    Vitals:   09/23/19 1059  BP: 136/70  Pulse: 70  Resp: 16  SpO2: 98%   Body mass index is 30.38 kg/m.   Physical Exam  Nursing note reviewed. Constitutional: He is oriented to person, place, and time. He appears well-developed. No distress.  HENT:  Head: Normocephalic and atraumatic.  Mouth/Throat: Oropharynx  is clear and moist and mucous membranes are normal.  Eyes: Pupils are equal, round, and reactive to light. Conjunctivae are normal.  Cardiovascular: Normal rate and regular rhythm.  Murmur (I-II/VI LUSB and apex) heard. DP pulses present,bilateral.  Respiratory: Effort normal and breath sounds normal. No respiratory distress.  GI: Soft. He exhibits no mass. There is no hepatomegaly. There is no abdominal tenderness.  Musculoskeletal:        General: No edema.  Lymphadenopathy:    He has no cervical adenopathy.  Neurological: He is alert and oriented to person, place, and time. He has normal strength. No cranial nerve deficit.  Stable gait, not assistance needed.  Skin: Skin is warm. No rash noted. No erythema.  Psychiatric: He has a normal mood and affect.  Well groomed, good eye contact.   ASSESSMENT AND PLAN:  [name] was seen today for chronic disease management.  Orders Placed This Encounter  Procedures  . ECHOCARDIOGRAM COMPLETE    1. Type 2 diabetes mellitus with other specified complication, with long-term current use of insulin ([place]) HgA1C is not at goal. No changes in current management, recommend taking Novolog before meals. Recommend checking post prandial BS's Regular exercise and healthy diet with avoidance of added sugar food intake is an important part of treatment and  recommended. Annual eye exam, periodic dental and foot care recommended. He is going to arrange appt with his endocrinologist.  2. Hypertension associated with diabetes ([place]) BP adequately controlled. No changes in current management. Continue low salt diet. Reporting eye exam as current.  3. Heart murmur Echo will be arranged, it was ordered by former PCP in 2018,not done. Instructed about warning signs.  4. Pure hypercholesterolemia LDL closed to goal. HDL low, recommend low fat diet,continue Atorvastatin 40 mg,and < 8 oz of wine a few times per week.  5. Chronic gout without tophus, unspecified cause, unspecified site Well controlled. No changes in current management.   Return in about 4 months (around 01/23/2020) for cpe.  Patrick Robb G. [name], MD  [facility]. [place] office.  A few things to remember from today's visit:  Take Novalog before or with meals. No changes in dose. Check blood sugar 2 hours af ter meals.  Arrange appt with Dr Dwyane Dee and eye Dr.  I will see you in 01/2020 for your physical.  Please be sure medication list is accurate. If a new problem present, please set up appointment sooner than planned today.

## 2019-09-23 NOTE — Patient Instructions (Signed)
A few things to remember from today's visit:   Take Novalog before or with meals. No changes in dose. Check blood sugar 2 hours af ter meals.  Arrange appt with Dr Dwyane Dee and eye Dr.  I will see you in 01/2020 for your physical.  Please be sure medication list is accurate. If a new problem present, please set up appointment sooner than planned today.

## 2019-09-23 NOTE — Chronic Care Management (AMB) (Incomplete)
? ?  Chronic Care Management ?Pharmacy ? ?Name: Patrick Wilcox  MRN: [id number] DOB: 09/28/1945 ? ?Initial Questions: ?1. Have you seen any other providers since your last visit? {YES/NO:21197} ?2. Any changes in your medicines or health? {YES/NO:21197} ? ?Chief Complaint/ HPI ? ?[name],  74 y.o. , male presents for their Initial CCM visit with the clinical pharmacist via telephone due to COVID-19 Pandemic. ? ?PCP : [name], [name], MD ? ?Their chronic conditions include: {CHL AMB CHRONIC MEDICAL [phone]} ?HTN, Hx of MI, DM, HLD, Gout  ? ?Office Visits: ?09/23/2019- Patient presented in office with Dr. [name], MD. A1c: 8.2%. Patient to continue Novolog at same dose. Patient referred to Dr. Dwyane Dee (endocrinology).  ? ?05/15/2019- Patient presented in office with Dr. [name], MD for AWV. No medication changes. Patient to follow up in 6 months.  ? ?Consult Visit:*** ? ?Medications: ?Outpatient Encounter Medications as of 09/23/2019  ?Medication Sig Note  ?? Alcohol Swabs (B-D SINGLE USE SWABS REGULAR) PADS Use as necessary   ?? allopurinol (ZYLOPRIM) 300 MG tablet TAKE 1 TABLET BY MOUTH EVERY DAY   ?? amLODipine (NORVASC) 10 MG tablet TAKE 1 TABLET BY MOUTH EVERY DAY   ?? aspirin 81 MG tablet Take 81 mg by mouth daily.   ?? atorvastatin (LIPITOR) 40 MG tablet TAKE 1 TABLET BY MOUTH EVERY DAY   ?? Blood Glucose Monitoring Suppl (ONETOUCH VERIO IQ SYSTEM) w/Device KIT USE TO CHECK BLOOD SUGAR TWICE A DAY DX CODE E11.9   ?? colchicine (COLCRYS) 0.6 MG tablet take 1 tablet by mouth once daily   ?? EASY COMFORT PEN NEEDLES 31G X 5 MM MISC Used to check  Blood glucose 3 times daily or prn.   ?? furosemide (LASIX) 20 MG tablet TAKE 1 TABLET BY MOUTH EVERY DAY   ?? insulin degludec (TRESIBA FLEXTOUCH) 100 UNIT/ML SOPN FlexTouch Pen 40 UNITS DAILY WITH SUPPER, IF AM SUGARS STAYING < 90 IN AM REDUCE TRESIBA TO 38 UNITS 1500/40=38   ? ? metFORMIN (GLUCOPHAGE) 1000 MG tablet TAKE ONE TABLET BY MOUTH TWICE DAILY. TAKE WITH A MEAL.   ?? metoprolol tartra

## 2019-09-23 NOTE — Telephone Encounter (Signed)
It is Ok to place referral. Thanks, BJ 

## 2019-09-26 ENCOUNTER — Encounter: Payer: Self-pay | Admitting: Family Medicine

## 2019-09-27 ENCOUNTER — Other Ambulatory Visit: Payer: Self-pay | Admitting: Pharmacist

## 2019-09-27 NOTE — Patient Outreach (Signed)
[facility]) Care Management  09/27/2019  Patrick Wilcox 07/12/1945 [id number]   Patient's case is being closed as the [name] Team has transitioned to the Quality Department and will no longer document in [facility].  Elayne Guerin, PharmD, [place] Clinical Pharmacist [phone]

## 2019-10-01 ENCOUNTER — Ambulatory Visit: Payer: Self-pay | Admitting: Pharmacist

## 2019-10-10 ENCOUNTER — Telehealth: Payer: Self-pay | Admitting: Family Medicine

## 2019-10-10 NOTE — Telephone Encounter (Signed)
[facility]: [place]

## 2019-10-15 ENCOUNTER — Other Ambulatory Visit: Payer: Self-pay | Admitting: *Deleted

## 2019-10-15 DIAGNOSIS — Z794 Long term (current) use of insulin: Secondary | ICD-10-CM

## 2019-10-15 DIAGNOSIS — E1169 Type 2 diabetes mellitus with other specified complication: Secondary | ICD-10-CM

## 2019-10-15 MED ORDER — TRESIBA FLEXTOUCH 100 UNIT/ML ~~LOC~~ SOPN: PEN_INJECTOR | SUBCUTANEOUS | 3 refills | Status: DC

## 2019-10-15 MED ORDER — NOVOLOG FLEXPEN 100 UNIT/ML ~~LOC~~ SOPN: PEN_INJECTOR | SUBCUTANEOUS | 3 refills | Status: DC

## 2019-10-15 NOTE — Telephone Encounter (Signed)
Rx's sent to the pharmacy as requested. 

## 2019-10-16 ENCOUNTER — Other Ambulatory Visit: Payer: Self-pay | Admitting: Family Medicine

## 2019-10-17 ENCOUNTER — Other Ambulatory Visit: Payer: Self-pay

## 2019-10-18 ENCOUNTER — Other Ambulatory Visit: Payer: HMO

## 2019-10-21 ENCOUNTER — Ambulatory Visit (INDEPENDENT_AMBULATORY_CARE_PROVIDER_SITE_OTHER): Payer: HMO | Admitting: Endocrinology

## 2019-10-21 ENCOUNTER — Encounter: Payer: Self-pay | Admitting: Endocrinology

## 2019-10-21 ENCOUNTER — Other Ambulatory Visit: Payer: Self-pay

## 2019-10-21 VITALS — BP 160/78 | HR 70 | Ht 72.0 in | Wt 217.6 lb

## 2019-10-21 DIAGNOSIS — R809 Proteinuria, unspecified: Secondary | ICD-10-CM

## 2019-10-21 DIAGNOSIS — E1165 Type 2 diabetes mellitus with hyperglycemia: Secondary | ICD-10-CM | POA: Diagnosis not present

## 2019-10-21 DIAGNOSIS — I10 Essential (primary) hypertension: Secondary | ICD-10-CM

## 2019-10-21 DIAGNOSIS — Z794 Long term (current) use of insulin: Secondary | ICD-10-CM | POA: Diagnosis not present

## 2019-10-21 NOTE — Progress Notes (Signed)
Patient ID: Patrick Wilcox, male   DOB: [birth year], 74 y.o.   MRN: [id number]    Reason for Appointment: Followup for Type 2 Diabetes   History of Present Illness:          Diagnosis: Type 2 diabetes mellitus, date of diagnosis: 2004       Past history:  At diagnosis he was having symptoms of drowsiness, difficulty breathing and blood sugar was reportedly over 600 He was initially treated with insulin but subsequently switched back to oral drugs He may have taken metformin and other medications for some time before going back on insulin His records indicate that he had been taking Amaryl and Januvia before going back on insulin in mid-2014 when his A1c had gone up over 13 Initially was given premixed insulin and then switched to Lantus and Humalog Record of his A1c indicates that his levels previously had been over 8% since 02/2012 He  had much better blood sugar control on his last visit with his changing his lifestyle and being instructed by the nurse educator  His Lantus did not appear to have 24 hour control with once a day injection  Recent history:   INSULIN regimen is described as:  40 units Tresiba daily in p.m., Novolog 22 units at 9 PM       Oral hypoglycemic drugs the patient is taking are: Metformin 1 g twice a day     His A1c is higher than usual at 8.2 as of 3/21 He has not been seen since 12/19  Glucose monitoring, current management and problems identified:  He has been arbitrarily taking his medications and insulin and has had no regular regimen  He also thinks that he is eating more sweets and drinking regular soft drinks  As before despite reminders he does not check his sugars after meals  His blood sugar meter is programmed to the right time  Morning sugars are inconsistent and may depend on his diet the night before  Says that he is frequently not taking his NovoLog at dinnertime since he is taking [name] at bedtime  Again despite reminders  he does not check his readings after his evening meal  Previously was taking 20 units of NovoLog in the morning but does not take any at that time even though he is eating fried potatoes at breakfast  He now takes NovoLog 22 units closer to bedtime and not at suppertime as he was doing before  He has not taken Metformin irregularly also, does not think he has any GI side effects with this  No reported hypoglycemia although sometimes he may have a headache which he thinks is from too much medication  No fasting hypoglycemia also  Hypoglycemia: None   DIET: Has seen the dietitian in 2014 and nurse educator in 8/18       Meals: 2 meals per day 10 am and 6 pm.   Breakfast is  eggs;  toast, eggs, has fruit for snacks.  Evening meal usually vegetables, tuna salad, chicken.       Compliance with the medical regimen:  Fair    Glucose monitoring: Checking less than 1 times a day        Glucometer:  One Touch Verio    Blood Glucose readings  from  Download show   PRE-MEAL  mornings Lunch Dinner Bedtime Overall  Glucose range: 93-211   ?   Mean/median:  143         Previous blood  sugar range 60 up to 156 with median 113, all readings before noon   Exercise:  walking about a mile, 3-5/7 days  Weight history:  Wt Readings from Last 3 Encounters:  10/21/19 217 lb 9.6 oz (98.7 kg)  09/23/19 224 lb (101.6 kg)  05/15/19 216 lb 9.6 oz (98.2 kg)    Glycemic control:   Lab Results  Component Value Date   HGBA1C 8.2 (H) 09/09/2019   HGBA1C 7.1 (A) 06/19/2018   HGBA1C 7.3 (A) 03/19/2018   Lab Results  Component Value Date   MICROALBUR 148.4 (H) 09/09/2019   LDLCALC 71 09/09/2019   CREATININE 0.75 09/09/2019    Other active problems: See review of systems   No visits with results within 1 Week(s) from this visit.  Latest known visit with results is:  Office Visit on 05/15/2019  Component Date Value Ref Range Status  . Microalb, Ur 09/09/2019 148.4* 0.0 - 1.9 mg/dL Final    . Creatinine,U 09/09/2019 151.4  mg/dL Final  . Microalb Creat Ratio 09/09/2019 98.1* 0.0 - 30.0 mg/g Final  . Cholesterol 09/09/2019 125  0 - 200 mg/dL Final   ATP III Classification       Desirable:  < 200 mg/dL               Borderline High:  200 - 239 mg/dL          High:  > = 240 mg/dL  . Triglycerides 09/09/2019 114.0  0.0 - 149.0 mg/dL Final   Normal:  <150 mg/dLBorderline High:  150 - 199 mg/dL  . HDL 09/09/2019 31.40* >39.00 mg/dL Final  . VLDL 09/09/2019 22.8  0.0 - 40.0 mg/dL Final  . LDL Cholesterol 09/09/2019 71  0 - 99 mg/dL Final  . Total CHOL/HDL Ratio 09/09/2019 4   Final                  Men          Women1/2 Average Risk     3.4          3.3Average Risk          5.0          4.42X Average Risk          9.6          7.13X Average Risk          15.0          11.0                      . NonHDL 09/09/2019 94.07   Final   NOTE:  Non-HDL goal should be 30 mg/dL higher than patient's LDL goal (i.e. LDL goal of < 70 mg/dL, would have non-HDL goal of < 100 mg/dL)  . Sodium 09/09/2019 141  135 - 145 mEq/L Final  . Potassium 09/09/2019 4.2  3.5 - 5.1 mEq/L Final  . Chloride 09/09/2019 101  96 - 112 mEq/L Final  . CO2 09/09/2019 31  19 - 32 mEq/L Final  . Glucose, Bld 09/09/2019 116* 70 - 99 mg/dL Final  . BUN 09/09/2019 7  6 - 23 mg/dL Final  . Creatinine, Ser 09/09/2019 0.75  0.40 - 1.50 mg/dL Final  . Total Bilirubin 09/09/2019 1.1  0.2 - 1.2 mg/dL Final  . Alkaline Phosphatase 09/09/2019 119* 39 - 117 U/L Final  . AST 09/09/2019 21  0 - 37 U/L Final  . ALT 09/09/2019 22  0 - 53  U/L Final  . Total Protein 09/09/2019 7.6  6.0 - 8.3 g/dL Final  . Albumin 09/09/2019 4.3  3.5 - 5.2 g/dL Final  . GFR 09/09/2019 123.22  >60.00 mL/min Final  . Calcium 09/09/2019 9.9  8.4 - 10.5 mg/dL Final  . Hgb A1c MFr Bld 09/09/2019 8.2* 4.6 - 6.5 % Final   Glycemic Control Guidelines for People with Diabetes:Non Diabetic:  <6%Goal of Therapy: <7%Additional Action Suggested:  >8%   .  Fructosamine 09/09/2019 315* 205 - 285 umol/L Final  . Uric Acid, Serum 09/09/2019 4.4  4.0 - 7.8 mg/dL Final  . Hepatitis C Ab 09/09/2019 NON-REACTIVE  NON-REACTI Final  . SIGNAL TO CUT-OFF 09/09/2019 0.02  <1.00 Final   Comment: . HCV antibody was non-reactive. There is no laboratory  evidence of HCV infection. . In most cases, no further action is required. However, if recent HCV exposure is suspected, a test for HCV RNA (test code 9347389730) is suggested. . For additional information please refer to http://education.[facility].com/faq/FAQ22v1 (This link is being provided for informational/ educational purposes only.) .       Allergies as of 10/21/2019   No Known Allergies     Medication List       Accurate as of October 21, 2019  9:38 AM. If you have any questions, ask your nurse or doctor.        allopurinol 300 MG tablet Commonly known as: ZYLOPRIM TAKE 1 TABLET BY MOUTH EVERY DAY   amLODipine 10 MG tablet Commonly known as: NORVASC TAKE 1 TABLET BY MOUTH EVERY DAY   aspirin 81 MG tablet Take 81 mg by mouth daily.   atorvastatin 40 MG tablet Commonly known as: LIPITOR TAKE 1 TABLET BY MOUTH EVERY DAY   B-D SINGLE USE SWABS REGULAR Pads Use as necessary   colchicine 0.6 MG tablet Commonly known as: Colcrys take 1 tablet by mouth once daily   Easy Comfort Pen Needles 31G X 5 MM Misc Generic drug: Insulin Pen Needle Used to check  Blood glucose 3 times daily or prn.   furosemide 20 MG tablet Commonly known as: LASIX TAKE 1 TABLET BY MOUTH EVERY DAY   metFORMIN 1000 MG tablet Commonly known as: GLUCOPHAGE TAKE ONE TABLET BY MOUTH TWICE DAILY. TAKE WITH A MEAL.   metoprolol tartrate 100 MG tablet Commonly known as: LOPRESSOR TAKE 1 TABLET BY MOUTH TWICE A DAY   NovoLOG FlexPen 100 UNIT/ML FlexPen Generic drug: insulin aspart Inject into the skin in the morning and at bedtime. Inject 10-15 units under the skin before breakfast if needed and 22  units at dinner. What changed: Another medication with the same name was removed. Continue taking this medication, and follow the directions you see here. Changed by: [name], MD   nystatin-triamcinolone ointment Commonly known as: MYCOLOG Apply topically 2 (two) times daily.   OneTouch Delica Lancets 53G Misc Use to check blood sugar twice daily. DX Code E11.9   OneTouch Verio IQ System w/Device Kit USE TO CHECK BLOOD SUGAR TWICE A DAY DX CODE E11.9   OneTouch Verio test strip Generic drug: glucose blood USE AS INSTRUCTED   ramipril 10 MG capsule Commonly known as: ALTACE TAKE 1 CAPSULE (10 MG TOTAL) BY MOUTH 2 (TWO) TIMES DAILY.   sildenafil 50 MG tablet Commonly known as: VIAGRA Take 1 tablet (50 mg total) by mouth daily as needed for erectile dysfunction.   [name] 100 UNIT/ML FlexTouch Pen Generic drug: insulin degludec Inject 40 Units into the skin  at bedtime. Inject 40 units under the skin at bedtime. What changed: Another medication with the same name was removed. Continue taking this medication, and follow the directions you see here. Changed by: [name], MD       Allergies:  No Known Allergies  Past Medical History:  Diagnosis Date  . Cataract    had surgery to remove them  . CORONARY ARTERY DISEASE 11/23/2006  . DIABETES MELLITUS, TYPE II 11/23/2006  . GOUT 05/20/2008  . HYPERLIPIDEMIA 11/23/2006  . HYPERTENSION 11/23/2006  . 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 PNEUMONIA, LEFT LOWER LOBE 09/08/2009    Past Surgical History:  Procedure Laterality Date  . CORONARY ANGIOPLASTY WITH STENT PLACEMENT    . CORONARY ARTERY BYPASS GRAFT  2003    Family History  Problem Relation Age of Onset  . Heart disease Mother   . Heart disease Father   . Diabetes Sister   . Diabetes Brother     Social History:  reports that he has never smoked. He has never used smokeless tobacco. He reports that he does not drink alcohol or use drugs.     Review of Systems        Lipids:  LDL has been treated to goal with Lipitor40 mg Has history of CAD, followed by PCP HDL low       Lab Results  Component Value Date   CHOL 125 09/09/2019   HDL 31.40 (L) 09/09/2019   LDLCALC 71 09/09/2019   TRIG 114.0 09/09/2019   CHOLHDL 4 09/09/2019       The blood pressure has been treated with Norvasc10 mg, ramipril and metoprolol, Followed by PCP Blood pressure is higher today even though it was better last month, may be anxious   BP Readings from Last 3 Encounters:  10/21/19 (!) 160/78  09/23/19 136/70  97/98/92 119/41     Complications: No evidence of neuropathy or nephropathy  Diabetic foot exam was done in 11/20 by his PCP  Last exam with eye doctor  was 4/20        LABS:  No visits with results within 1 Week(s) from this visit.  Latest known visit with results is:  Office Visit on 05/15/2019  Component Date Value Ref Range Status  . Microalb, Ur 09/09/2019 148.4* 0.0 - 1.9 mg/dL Final  . Creatinine,U 09/09/2019 151.4  mg/dL Final  . Microalb Creat Ratio 09/09/2019 98.1* 0.0 - 30.0 mg/g Final  . Cholesterol 09/09/2019 125  0 - 200 mg/dL Final   ATP III Classification       Desirable:  < 200 mg/dL               Borderline High:  200 - 239 mg/dL          High:  > = 240 mg/dL  . Triglycerides 09/09/2019 114.0  0.0 - 149.0 mg/dL Final   Normal:  <150 mg/dLBorderline High:  150 - 199 mg/dL  . HDL 09/09/2019 31.40* >39.00 mg/dL Final  . VLDL 09/09/2019 22.8  0.0 - 40.0 mg/dL Final  . LDL Cholesterol 09/09/2019 71  0 - 99 mg/dL Final  . Total CHOL/HDL Ratio 09/09/2019 4   Final                  Men          Women1/2 Average Risk     3.4          3.3Average Risk  5.0          4.42X Average Risk          9.6          7.13X Average Risk          15.0          11.0                      . NonHDL 09/09/2019 94.07   Final   NOTE:  Non-HDL goal should be 30 mg/dL higher than patient's LDL goal (i.e. LDL goal of < 70 mg/dL,  would have non-HDL goal of < 100 mg/dL)  . Sodium 09/09/2019 141  135 - 145 mEq/L Final  . Potassium 09/09/2019 4.2  3.5 - 5.1 mEq/L Final  . Chloride 09/09/2019 101  96 - 112 mEq/L Final  . CO2 09/09/2019 31  19 - 32 mEq/L Final  . Glucose, Bld 09/09/2019 116* 70 - 99 mg/dL Final  . BUN 09/09/2019 7  6 - 23 mg/dL Final  . Creatinine, Ser 09/09/2019 0.75  0.40 - 1.50 mg/dL Final  . Total Bilirubin 09/09/2019 1.1  0.2 - 1.2 mg/dL Final  . Alkaline Phosphatase 09/09/2019 119* 39 - 117 U/L Final  . AST 09/09/2019 21  0 - 37 U/L Final  . ALT 09/09/2019 22  0 - 53 U/L Final  . Total Protein 09/09/2019 7.6  6.0 - 8.3 g/dL Final  . Albumin 09/09/2019 4.3  3.5 - 5.2 g/dL Final  . GFR 09/09/2019 123.22  >60.00 mL/min Final  . Calcium 09/09/2019 9.9  8.4 - 10.5 mg/dL Final  . Hgb A1c MFr Bld 09/09/2019 8.2* 4.6 - 6.5 % Final   Glycemic Control Guidelines for People with Diabetes:Non Diabetic:  <6%Goal of Therapy: <7%Additional Action Suggested:  >8%   . Fructosamine 09/09/2019 315* 205 - 285 umol/L Final  . Uric Acid, Serum 09/09/2019 4.4  4.0 - 7.8 mg/dL Final  . Hepatitis C Ab 09/09/2019 NON-REACTIVE  NON-REACTI Final  . SIGNAL TO CUT-OFF 09/09/2019 0.02  <1.00 Final   Comment: . HCV antibody was non-reactive. There is no laboratory  evidence of HCV infection. . In most cases, no further action is required. However, if recent HCV exposure is suspected, a test for HCV RNA (test code [id number]) is suggested. . For additional information please refer to http://education.[facility].com/faq/FAQ22v1 (This link is being provided for informational/ educational purposes only.) .     Physical Examination:  BP (!) 160/78 (BP Location: Left Arm, Patient Position: Sitting, Cuff Size: Normal)   Pulse 70   Ht 6' (1.829 m)   Wt 217 lb 9.6 oz (98.7 kg)   SpO2 98%   BMI 29.51 kg/m        ASSESSMENT/PLAN  Diabetes type 2, On insulin   See history of present illness for detailed discussion  of current management, problems identified and blood sugar patterns.  A1c is 8.2% and fructosamine is 315  Again his understanding and compliance with his diabetes medications, insulin, monitoring is inadequate  Discussed with him the nature of mealtime insulin compared to basal insulin and the timing of taking this He does not check readings after meals and discussed importance of doing this to help decide on the doses rather than taking arbitrary doses which he is doing now His variable fasting readings are likely related to inconsistent diet  Also reassured him that Metformin does not cause headaches and needs to take the 2 tablets separately and not  together  HYPERTENSION: Continue follow-up with PCP, to make sure he takes his medications when he comes into the office Microalbuminuria: Consider switching Norvasc to diltiazem for better renal effects    Patient Instructions   To check fasting blood sugar every other day and readings after meals at least once a day Discussed blood sugar targets after meals and in the morning  Metformin 2x daily with food  TRESIBA 38 UNITS WITH 12 novolog at suppertime and not at bedtime  Novolog 12 units daily at breakfast when eating carbs  Cut back on sweets and drinks  Walk daily  More consistent regular follow-ups, I will see him back in 6 weeks for short-term follow-up    Patient Instructions  Check blood sugars on waking up 3-4 days a week  Also check blood sugars about 2 hours after meals and do this after different meals by rotation  Recommended blood sugar levels on waking up are 90-130 and about 2 hours after meal is 130-160  Please bring your blood sugar monitor to each visit, thank you  Metformin 2x daily  TRESIBA 38 UNITS WITH 12 novolog   Novolog 12 units daily at breakfast when eating carbs  Less sweets and drinks  Walk daily         [name] 10/21/2019, 9:38 AM   Note: This office note was prepared with  Dragon voice recognition system technology. Any transcriptional errors that result from this process are unintentional.

## 2019-10-21 NOTE — Patient Instructions (Addendum)
Check blood sugars on waking up 3-4 days a week  Also check blood sugars about 2 hours after meals and do this after different meals by rotation  Recommended blood sugar levels on waking up are 90-130 and about 2 hours after meal is 130-180  Please bring your blood sugar monitor to each visit, thank you  Metformin 2x daily  TRESIBA 38 UNITS WITH 12 novolog   Novolog 12 units daily at breakfast when eating carbs  Less sweets and drinks  Walk daily

## 2019-10-28 ENCOUNTER — Other Ambulatory Visit: Payer: Self-pay

## 2019-10-28 ENCOUNTER — Telehealth: Payer: HMO

## 2019-10-29 ENCOUNTER — Other Ambulatory Visit: Payer: Self-pay | Admitting: Family Medicine

## 2019-11-06 ENCOUNTER — Telehealth: Payer: HMO

## 2019-11-07 ENCOUNTER — Telehealth: Payer: Self-pay | Admitting: Family Medicine

## 2019-11-07 NOTE — Telephone Encounter (Signed)
Patient came in office for his appt on the wrong day.  He said he can't come tomorrow and needs to reschedule.  He also states that he didn't know it was a telephone visit.

## 2019-11-07 NOTE — Telephone Encounter (Signed)
Patient came in office for his appt on the wrong day.  He said he can't come tomorrow and needs to reschedule.  He also states that he didn't know it was a telephone visit.                                   SDOH                                     Social Determinants of Health     Expand All  Collapse All     Intimate Partner Violence      Feb 06 2019: Not At Risk                               Social Connections      Jul 08 2019: Slightly Isolated                                        Tobacco Use      Oct 21 2019: Low Risk                        Financial Resource Strain      Jul 08 2019: Low Risk                     Depression (PHQ2-9)      May 15 2019: Low Risk                    Stress      Jul 08 2019: No Stress Concern Present                    Physical Activity      Jul 08 2019: Sufficiently Active                       Food Insecurity      Jul 08 2019: No Food Insecurity                        Transportation Needs      Jul 08 2019: No Transportation Needs                       Alcohol Screen      Not on file           Housing      Jul 08 2019: [place] [id number])                                                                                ?  Routing      SmartSets      Problem List      Charge Capture                      This appointment was rescheduled from earlier this week to  tomorrow.   Contacted patient. Patient stated he was confused from several calls he was receiving. Patient confirmed he will be able to keep tomorrow's appt (4/30) and appointment was converted to in-person. Patient aware of prescreening before coming in.

## 2019-11-08 ENCOUNTER — Ambulatory Visit: Payer: HMO

## 2019-11-08 ENCOUNTER — Other Ambulatory Visit: Payer: Self-pay

## 2019-11-08 DIAGNOSIS — E1169 Type 2 diabetes mellitus with other specified complication: Secondary | ICD-10-CM

## 2019-11-08 DIAGNOSIS — M1A9XX0 Chronic gout, unspecified, without tophus (tophi): Secondary | ICD-10-CM

## 2019-11-08 DIAGNOSIS — E1159 Type 2 diabetes mellitus with other circulatory complications: Secondary | ICD-10-CM

## 2019-11-08 DIAGNOSIS — I2510 Atherosclerotic heart disease of native coronary artery without angina pectoris: Secondary | ICD-10-CM

## 2019-11-08 DIAGNOSIS — Z794 Long term (current) use of insulin: Secondary | ICD-10-CM

## 2019-11-08 DIAGNOSIS — E7800 Pure hypercholesterolemia, unspecified: Secondary | ICD-10-CM

## 2019-11-08 NOTE — Chronic Care Management (AMB) (Signed)
Chronic Care Management Pharmacy  Name: Patrick Wilcox  MRN: [id number] DOB: 1946-05-12  Initial Questions: 1. Have you seen any other providers since your last visit? NA 2. Any changes in your medicines or health? No   Chief Complaint/ HPI  Patrick Wilcox,  74 y.o. , male presents for their Initial CCM visit with the clinical pharmacist via telephone due to COVID-19 Pandemic.  PCP : [name], [name], MD  Their chronic conditions include: HTN, HLD/ coronary atherosclerosis/ history of MI, DM, Gout  Office Visits:  09/23/2019- Patient presented for office visit with Dr. [name], MD for follow up. For DM, patient is on Novolog 22 units after meals and Tresiba 40 units. Reported BGs: 80s-110s. A1c: 8.2 (09/09/2019). For HTN, BP at office visit: 136/70. Patient on amlodipine 31m, metoprolol tartrate 1056mBID, ramipril 1051mBID. DM: since A1c is not at goal, patient to arrange att with endocrinologist. No changes on HTN, HLD, gout regimen. Patient to continue low fat diet and low salt diet. Patient to return in 4 months for CPE.   Consult Visit: 10/21/2019- Endocrinology- Patient presented for office visit with Dr. AjaElayne SnareD. BP: 160/36m56mfor uncontrolled type 2 diabetes mellitus. Patient counseled on nature of mealtime insulin compared to basal insulin. Does not check BG readings; importance was discussed. Patient was reassured metformin does not cause headaches and needs to take the 2 tablets separately. Patient to follow up with PCP on HTN regimen. Consider switching Norvasc to diltiazem for better renal effects.   Medications: Outpatient Encounter Medications as of 11/08/2019  Medication Sig Note  . Alcohol Swabs (B-D SINGLE USE SWABS REGULAR) PADS Use as necessary   . allopurinol (ZYLOPRIM) 300 MG tablet TAKE 1 TABLET BY MOUTH EVERY DAY   . amLODipine (NORVASC) 10 MG tablet TAKE 1 TABLET BY MOUTH EVERY DAY   . aspirin 81 MG tablet Take 81 mg by mouth daily.   . atMarland Kitchenrvastatin  (LIPITOR) 40 MG tablet TAKE 1 TABLET BY MOUTH EVERY DAY   . Blood Glucose Monitoring Suppl (ONETOUCH VERIO IQ SYSTEM) w/Device KIT USE TO CHECK BLOOD SUGAR TWICE A DAY DX CODE E11.9   . colchicine 0.6 MG tablet TAKE 1 TABLET BY MOUTH EVERY DAY (Patient taking differently: TAKE 1 TABLET BY MOUTH EVERY DAY AS NEEDED)   . EASY COMFORT PEN NEEDLES 31G X 5 MM MISC Used to check  Blood glucose 3 times daily or prn.   . furosemide (LASIX) 20 MG tablet TAKE 1 TABLET BY MOUTH EVERY DAY   . insulin aspart (NOVOLOG FLEXPEN) 100 UNIT/ML FlexPen Inject into the skin in the morning and at bedtime. Inject 10-15 units under the skin before breakfast if needed and 22 units at dinner.   . insulin degludec (TRESIBA FLEXTOUCH) 100 UNIT/ML FlexTouch Pen Inject 40 Units into the skin at bedtime. Inject 40 units under the skin at bedtime.   . metFORMIN (GLUCOPHAGE) 1000 MG tablet TAKE ONE TABLET BY MOUTH TWICE DAILY. TAKE WITH A MEAL.   . metoprolol tartrate (LOPRESSOR) 100 MG tablet TAKE 1 TABLET BY MOUTH TWICE A DAY   . ONETOUCH DELICA LANCETS 33G 01IC Use to check blood sugar twice daily. DX Code E11.9   . ONETOUCH VERIO test strip USE AS INSTRUCTED   . ramipril (ALTACE) 10 MG capsule TAKE 1 CAPSULE (10 MG TOTAL) BY MOUTH 2 (TWO) TIMES DAILY.   . nyMarland Kitchentatin-triamcinolone ointment (MYCOLOG) Apply topically 2 (two) times daily. (Patient not taking: Reported on 11/20/2019) 07/08/2019: States he  is out of this medication and needs a refill to use prn  . sildenafil (VIAGRA) 50 MG tablet Take 1 tablet (50 mg total) by mouth daily as needed for erectile dysfunction. (Patient not taking: Reported on 11/20/2019)   . [DISCONTINUED] furosemide (LASIX) 20 MG tablet TAKE 1 TABLET BY MOUTH EVERY DAY    No facility-administered encounter medications on file as of 11/08/2019.     Current Diagnosis/Assessment:  Goals Addressed            This Visit's Progress   . Pharmacy Care Plan       CARE PLAN ENTRY  Current Barriers:   . Chronic Disease Management support, education, and care coordination needs related to Hypertension, Hyperlipidemia, Diabetes, and coronary atherosclerosis/ history of MI   Hypertension . Pharmacist Clinical Goal(s): o Over the next 21 days, patient will work with PharmD and providers to achieve BP goal <140/90 . Current regimen:  o amlodipine 42m, 1 tablet once daily o metoprolol tartrate 1059m 1 tablet twice daily o ramipril 1071m1 capsule twice daily  o furosemide 36m32m tablet once daily  . Interventions: o We discussed: DASH diet:  following a diet emphasizing fruits and vegetables and low-fat dairy products along with whole grains, fish, poultry, and nuts. Reducing red meats and sugars.  . Patient self care activities - Over the next 21 days, patient will: o Report back for office visit for blood pressure monitor review on 11/26/2019 at 9:00 a.m.   Hyperlipidemia/ coronary atherosclerosis/ history of MI . Pharmacist Clinical Goal(s): o Over the next 180 days, patient will work with PharmD and providers to maintain LDL goal < 70 . Current regimen:  o atorvstatin 40mg57mtablet once daily  o aspirin 81mg,6mablet once daily  . Interventions: o We discussed how a diet high in plant sterols (fruits/vegetables/nuts/whole grains/legumes) may reduce your cholesterol.  . Patient self care activities - Over the next 180 days, patient will: o Continue current medications.   Diabetes . Pharmacist Clinical Goal(s): o Over the next 30 days, patient will work with PharmD and providers to achieve A1c goal  as per Dr. Kumar Dwyane Deerrent regimen:  o insulin aspart (Novolog Flexpen), inject 10-15 units before breakfast if needed and 22 units at dinner o insulin degludec (TresiTyler Aasouch), inject 40 units at bedtime  o metformin 1000mg, 1mblet twice daily  . Patient self care activities - Over the next 30 days, patient will: o Check blood sugar twice daily, document, and provide at  future appointments o Contact provider with any episodes of hypoglycemia  Gout . Pharmacist Clinical Goal(s) o Over the next 180 days, patient will work with PharmD and providers to  . Current regimen:  o allopurinol 300mg, 155mlet once daily  o colchicine 0.6mg, 1 t12met once daily as needed . Patient self care activities o Continue current medications.   Medication management . Pharmacist Clinical Goal(s): o Over the next 180 days, patient will work with PharmD and providers to maintain optimal medication adherence . Current [facility]  . Interventions o Comprehensive medication review performed. o Continue current medication management strategy . Patient self care activities - Over the next 180 days, patient will: o Take medications as prescribed o Report any questions or concerns to PharmD and/or provider(s)  Initial goal documentation       SDOH Interventions     Most Recent Value  SDOH Interventions  SDOH Interventions for the Following Domains  Transportation  Transportation  Interventions  Other (Comment) [Not needed]      Diabetes  Patient reported being diagnosed in 2003.  Managed by Dr. Dwyane Dee.  Patient noted improvement since last visit with Dr. Dwyane Dee.  Patient reports injecting mealtime insulin 10 to 20 minutes before meals  Recent Relevant Labs: Lab Results  Component Value Date/Time   HGBA1C 8.2 (H) 09/09/2019 09:47 AM   HGBA1C 7.1 (A) 06/19/2018 08:26 AM   HGBA1C 7.3 (A) 03/19/2018 09:45 AM   HGBA1C 7.1 (H) 11/03/2017 09:14 AM   MICROALBUR 148.4 (H) 09/09/2019 09:47 AM   MICROALBUR 6.8 (H) 03/16/2018 09:31 AM    Checking BG: 2x per Day  Recent FBG Readings: 121 Recent 2hr PP BG readings:  98, 78  Patient is currently uncontrolled (based on blood sugars) on the following medications:  - insulin aspart (Novolog Flexpen), inject 10-15 units before breakfast if needed and 22 units at dinner - insulin degludec [name]), inject 40  units at bedtime  - metformin 1076m, 1 tablet twice daily   Last diabetic Eye exam:  Lab Results  Component Value Date/Time   HMDIABEYEEXA No Retinopathy 10/09/2017 12:00 AM  - patient reports obtaining yearly eye exam.    Last diabetic Foot exam:  Lab Results  Component Value Date/Time   HMDIABFOOTEX done 08/22/2011 12:00 AM    We discussed: how to recognize and treat signs of hypoglycemia . Preventative diabetic health exams . Maintain a low carbohydrate diet, eating 45 grams of carbohydrates per meal (3 servings of carbohydrates per meal), 15 grams of carbohydrates per snack (1 serving of carbohydrate per snack).   Plan Continue current medications   Hypertension  Patient reports having blood pressure machine, but does not know to use it.  Patient reported furosemide being for blood pressure; denies swelling.   Office blood pressures are  BP Readings from Last 3 Encounters:  10/21/19 (!) 160/78  09/23/19 136/70  05/15/19 140/70   Patient checks BP at home currently not checking  Patient home BP readings are ranging: currently not checking; has BP monitor, but does not know how to use it.   Patient is uncontrolled (based on last visit BP) on:  - amlodipine 123m 1 tablet once daily - metoprolol tartrate 10058m1 tablet twice daily - ramipril 64m54m capsule twice daily  - furosemide 20mg82mtablet once daily   We discussed diet and exercise extensively . DASH diet:  following a diet emphasizing fruits and vegetables and low-fat dairy products along with whole grains, fish, poultry, and nuts. Reducing red meats and sugars.  . Exercising     Plan Patient to bring BP monitor and review proper steps in checking BP.  Continue current medications   Hyperlipidemia/ Coronary atherosclerosis/ Hx of MI   Lipid Panel     Component Value Date/Time   CHOL 125 09/09/2019 0947   TRIG 114.0 09/09/2019 0947   TRIG 59 05/30/2006 0949   HDL 31.40 (L) 09/09/2019 0947    CHOLHDL 4 09/09/2019 0947   VLDL 22.8 09/09/2019 0947   LDLCALC 71 09/09/2019 0947    The ASCVD Risk score (Goff DC Jr., et al., 2013) failed to calculate for the following reasons:   The patient has a prior MI or stroke diagnosis   Patient is currently controlled on the following medications:  - atorvstatin 40mg,51mablet once daily (high intensity) - aspirin 81mg, 51mblet once daily   We discussed:  diet and exercise extensively  . How to reduce cholesterol through  diet/weight management and physical activity.    . We discussed how a diet high in plant sterols (fruits/vegetables/nuts/whole grains/legumes) may reduce your cholesterol.   Plan Continue current medications  Gout   Patient reported last flare-up being over a year ago.   Patient is currently controlled on the following medications:  - allopurinol 363m, 1 tablet once daily  - colchicine 0.646m 1 tablet once daily as needed  Uric acid: 4.4 (09/09/2019)   Plan Continue current medications  Medication Management  Patient organizes medications: patient reports having his own management system. He reports having empty bottles for the week and would organize his medications for the week (similar to pill box, but with pill bottles).  [facility] Adherence: no gaps in refill history (per medication dispense history from 04/28/2019 to 10/21/2019)  - no recent refill of metformin reported (patient endorsed forgetting to take 2nd dose of metformin, but is aware he needs to remember). We discussed setting up reminder or transitioning to adherence packaging. Patient declined.   Follow up Follow up visit in 1 month for blood pressure monitor review.  Refill requested for nystatin/ triamcinolone ointment and sildenafil 5065m  AnnAnson CroftsharmD Clinical Pharmacist [facility] [phone]

## 2019-11-13 ENCOUNTER — Ambulatory Visit: Payer: HMO | Admitting: Family Medicine

## 2019-11-18 ENCOUNTER — Other Ambulatory Visit: Payer: Self-pay | Admitting: Family Medicine

## 2019-11-20 ENCOUNTER — Other Ambulatory Visit: Payer: Self-pay | Admitting: *Deleted

## 2019-11-20 MED ORDER — NYSTATIN-TRIAMCINOLONE 100000-0.1 UNIT/GM-% EX OINT: TOPICAL_OINTMENT | Freq: Two times a day (BID) | CUTANEOUS | 0 refills | Status: DC

## 2019-11-20 NOTE — Patient Instructions (Addendum)
Visit Information  Goals Addressed            This Visit's Progress   . Pharmacy Care Plan       CARE PLAN ENTRY  Current Barriers:  . Chronic Disease Management support, education, and care coordination needs related to Hypertension, Hyperlipidemia, Diabetes, and coronary atherosclerosis/ history of MI   Hypertension . Pharmacist Clinical Goal(s): o Over the next 21 days, patient will work with PharmD and providers to achieve BP goal <140/90 . Current regimen:  o amlodipine 10mg , 1 tablet once daily o metoprolol tartrate 100mg , 1 tablet twice daily o ramipril 10mg , 1 capsule twice daily  o furosemide 20mg , 1 tablet once daily  . Interventions: o We discussed: DASH diet:  following a diet emphasizing fruits and vegetables and low-fat dairy products along with whole grains, fish, poultry, and nuts. Reducing red meats and sugars.  . Patient self care activities - Over the next 21 days, patient will: o Report back for office visit for blood pressure monitor review on 11/26/2019 at 9:00 a.m.   Hyperlipidemia/ coronary atherosclerosis/ history of MI . Pharmacist Clinical Goal(s): o Over the next 180 days, patient will work with PharmD and providers to maintain LDL goal < 70 . Current regimen:  o atorvstatin 40mg , 1 tablet once daily  o aspirin 81mg , 1 tablet once daily  . Interventions: o We discussed how a diet high in plant sterols (fruits/vegetables/nuts/whole grains/legumes) may reduce your cholesterol.  . Patient self care activities - Over the next 180 days, patient will: o Continue current medications.   Diabetes . Pharmacist Clinical Goal(s): o Over the next 30 days, patient will work with PharmD and providers to achieve A1c goal  as per Dr. Dwyane Dee  . Current regimen:  o insulin aspart (Novolog Flexpen), inject 10-15 units before breakfast if needed and 22 units at dinner o insulin degludec [name]), inject 40 units at bedtime  o metformin 1000mg , 1 tablet  twice daily  . Patient self care activities - Over the next 30 days, patient will: o Check blood sugar twice daily, document, and provide at future appointments o Contact provider with any episodes of hypoglycemia  Gout . Pharmacist Clinical Goal(s) o Over the next 180 days, patient will work with PharmD and providers to  . Current regimen:  o allopurinol 300mg , 1 tablet once daily  o colchicine 0.6mg , 1 tablet once daily as needed . Patient self care activities o Continue current medications.   Medication management . Pharmacist Clinical Goal(s): o Over the next 180 days, patient will work with PharmD and providers to maintain optimal medication adherence . [facility]  . Interventions o Comprehensive medication review performed. o Continue current medication management strategy . Patient self care activities - Over the next 180 days, patient will: o Take medications as prescribed o Report any questions or concerns to PharmD and/or provider(s)  Initial goal documentation        Patrick Wilcox was given information about Chronic Care Management services today including:  1. CCM service includes personalized support from designated clinical staff supervised by his physician, including individualized plan of care and coordination with other care providers 2. 24/7 contact phone numbers for assistance for urgent and routine care needs. 3. Standard insurance, coinsurance, copays and deductibles apply for chronic care management only during months in which we provide at least 20 minutes of these services. Most insurances cover these services at 100%, however patients may be responsible for any copay, coinsurance and/or  deductible if applicable. This service may help you avoid the need for more expensive face-to-face services. 4. Only one practitioner may furnish and bill the service in a calendar month. 5. The patient may stop CCM services at any time (effective at the end of the  month) by phone call to the office staff.  Patient agreed to services and verbal consent obtained.   The patient verbalized understanding of instructions provided today and agreed to receive a mailed copy of patient instruction and/or educational materials. Face to Face appointment with pharmacist scheduled for:  11/25/2019  Anson Crofts, PharmD Clinical Pharmacist [facility] [place] [phone]   Diabetes Mellitus and Nutrition, Adult When you have diabetes (diabetes mellitus), it is very important to have healthy eating habits because your blood sugar (glucose) levels are greatly affected by what you eat and drink. Eating healthy foods in the appropriate amounts, at about the same times every day, can help you:  Control your blood glucose.  Lower your risk of heart disease.  Improve your blood pressure.  Reach or maintain a healthy weight. Every person with diabetes is different, and each person has different needs for a meal plan. Your health care provider may recommend that you work with a diet and nutrition specialist (dietitian) to make a meal plan that is best for you. Your meal plan may vary depending on factors such as:  The calories you need.  The medicines you take.  Your weight.  Your blood glucose, blood pressure, and cholesterol levels.  Your activity level.  Other health conditions you have, such as heart or kidney disease. How do carbohydrates affect me? Carbohydrates, also called carbs, affect your blood glucose level more than any other type of food. Eating carbs naturally raises the amount of glucose in your blood. Carb counting is a method for keeping track of how many carbs you eat. Counting carbs is important to keep your blood glucose at a healthy level, especially if you use insulin or take certain oral diabetes medicines. It is important to know how many carbs you can safely have in each meal. This is different for every person.  Your dietitian can help you calculate how many carbs you should have at each meal and for each snack. Foods that contain carbs include:  Bread, cereal, rice, pasta, and crackers.  Potatoes and corn.  Peas, beans, and lentils.  Milk and yogurt.  Fruit and juice.  Desserts, such as cakes, cookies, ice cream, and candy. How does alcohol affect me? Alcohol can cause a sudden decrease in blood glucose (hypoglycemia), especially if you use insulin or take certain oral diabetes medicines. Hypoglycemia can be a life-threatening condition. Symptoms of hypoglycemia (sleepiness, dizziness, and confusion) are similar to symptoms of having too much alcohol. If your health care provider says that alcohol is safe for you, follow these guidelines:  Limit alcohol intake to no more than 1 drink per day for nonpregnant women and 2 drinks per day for men. One drink equals 12 oz of beer, 5 oz of wine, or 1 oz of hard liquor.  Do not drink on an empty stomach.  Keep yourself hydrated with water, diet soda, or unsweetened iced tea.  Keep in mind that regular soda, juice, and other mixers may contain a lot of sugar and must be counted as carbs. What are tips for following this plan?  Reading food labels  Start by checking the serving size on the "Nutrition Facts" label of packaged foods  and drinks. The amount of calories, carbs, fats, and other nutrients listed on the label is based on one serving of the item. Many items contain more than one serving per package.  Check the total grams (g) of carbs in one serving. You can calculate the number of servings of carbs in one serving by dividing the total carbs by 15. For example, if a food has 30 g of total carbs, it would be equal to 2 servings of carbs.  Check the number of grams (g) of saturated and trans fats in one serving. Choose foods that have low or no amount of these fats.  Check the number of milligrams (mg) of salt (sodium) in one serving. Most  people should limit total sodium intake to less than 2,300 mg per day.  Always check the nutrition information of foods labeled as "low-fat" or "nonfat". These foods may be higher in added sugar or refined carbs and should be avoided.  Talk to your dietitian to identify your daily goals for nutrients listed on the label. Shopping  Avoid buying canned, premade, or processed foods. These foods tend to be high in fat, sodium, and added sugar.  Shop around the outside edge of the grocery store. This includes fresh fruits and vegetables, bulk grains, fresh meats, and fresh dairy. Cooking  Use low-heat cooking methods, such as baking, instead of high-heat cooking methods like deep frying.  Cook using healthy oils, such as olive, canola, or sunflower oil.  Avoid cooking with butter, cream, or high-fat meats. Meal planning  Eat meals and snacks regularly, preferably at the same times every day. Avoid going long periods of time without eating.  Eat foods high in fiber, such as fresh fruits, vegetables, beans, and whole grains. Talk to your dietitian about how many servings of carbs you can eat at each meal.  Eat 4-6 ounces (oz) of lean protein each day, such as lean meat, chicken, fish, eggs, or tofu. One oz of lean protein is equal to: ? 1 oz of meat, chicken, or fish. ? 1 egg. ?  cup of tofu.  Eat some foods each day that contain healthy fats, such as avocado, nuts, seeds, and fish. Lifestyle  Check your blood glucose regularly.  Exercise regularly as told by your health care provider. This may include: ? 150 minutes of moderate-intensity or vigorous-intensity exercise each week. This could be brisk walking, biking, or water aerobics. ? Stretching and doing strength exercises, such as yoga or weightlifting, at least 2 times a week.  Take medicines as told by your health care provider.  Do not use any products that contain nicotine or tobacco, such as cigarettes and e-cigarettes. If  you need help quitting, ask your health care provider.  Work with a Social worker or diabetes educator to identify strategies to manage stress and any emotional and social challenges. Questions to ask a health care provider  Do I need to meet with a diabetes educator?  Do I need to meet with a dietitian?  What number can I call if I have questions?  When are the best times to check my blood glucose? Where to find more information:  American Diabetes Association: diabetes.org  Academy of Nutrition and Dietetics: www.eatright.[employer] of Diabetes and Digestive and Kidney Diseases (NIH): [employer] Summary  A healthy meal plan will help you control your blood glucose and maintain a healthy lifestyle.  Working with a diet and nutrition specialist (dietitian) can help you make a meal plan that  is best for you.  Keep in mind that carbohydrates (carbs) and alcohol have immediate effects on your blood glucose levels. It is important to count carbs and to use alcohol carefully. This information is not intended to replace advice given to you by your health care provider. Make sure you discuss any questions you have with your health care provider. Document Revised: 06/09/2017 Document Reviewed: 08/01/2016 Elsevier Patient Education  2020 [employer].

## 2019-11-25 ENCOUNTER — Other Ambulatory Visit: Payer: Self-pay

## 2019-11-25 ENCOUNTER — Ambulatory Visit: Payer: HMO

## 2019-11-25 NOTE — Chronic Care Management (AMB) (Signed)
Chronic Care Management Pharmacy  Name: Patrick Wilcox  MRN: [id number] DOB: 08-23-1945  Initial Questions: 1. Have you seen any other providers since your last visit? NA 2. Any changes in your medicines or health? No   Chief Complaint/ HPI  Patrick Wilcox,  74 y.o. , male presents for their Follow-Up CCM visit with the clinical pharmacist via telephone due to COVID-19 Pandemic. Patient presented for BP monitor review. Patient brought in monitor to review how to use.   Patient also reported difficulty paying for insulins and is on limited income.   PCP : [name], [name], MD    Their chronic conditions include: HTN, HLD/ coronary atherosclerosis/ history of MI, DM, Gout  Office Visits:  09/23/2019- Patient presented for office visit with Dr. [name], MD for follow up. For DM, patient is on Novolog 22 units after meals and Tresiba 40 units. Reported BGs: 80s-110s. A1c: 8.2 (09/09/2019). For HTN, BP at office visit: 136/70. Patient on amlodipine 35m, metoprolol tartrate 1056mBID, ramipril 1039mBID. DM: since A1c is not at goal, patient to arrange att with endocrinologist. No changes on HTN, HLD, gout regimen. Patient to continue low fat diet and low salt diet. Patient to return in 4 months for CPE.   Consult Visit: 10/21/2019- Endocrinology- Patient presented for office visit with Dr. AjaElayne SnareD. BP: 160/52m47mfor uncontrolled type 2 diabetes mellitus. Patient counseled on nature of mealtime insulin compared to basal insulin. Does not check BG readings; importance was discussed. Patient was reassured metformin does not cause headaches and needs to take the 2 tablets separately. Patient to follow up with PCP on HTN regimen. Consider switching Norvasc to diltiazem for better renal effects.   Medications: Outpatient Encounter Medications as of 11/25/2019  Medication Sig  . Alcohol Swabs (B-D SINGLE USE SWABS REGULAR) PADS Use as necessary  . allopurinol (ZYLOPRIM) 300 MG tablet TAKE  1 TABLET BY MOUTH EVERY DAY  . amLODipine (NORVASC) 10 MG tablet TAKE 1 TABLET BY MOUTH EVERY DAY  . aspirin 81 MG tablet Take 81 mg by mouth daily.  . atMarland Kitchenrvastatin (LIPITOR) 40 MG tablet TAKE 1 TABLET BY MOUTH EVERY DAY  . Blood Glucose Monitoring Suppl (ONETOUCH VERIO IQ SYSTEM) w/Device KIT USE TO CHECK BLOOD SUGAR TWICE A DAY DX CODE E11.9  . colchicine 0.6 MG tablet TAKE 1 TABLET BY MOUTH EVERY DAY (Patient taking differently: TAKE 1 TABLET BY MOUTH EVERY DAY AS NEEDED)  . EASY COMFORT PEN NEEDLES 31G X 5 MM MISC Used to check  Blood glucose 3 times daily or prn.  . furosemide (LASIX) 20 MG tablet TAKE 1 TABLET BY MOUTH EVERY DAY  . insulin aspart (NOVOLOG FLEXPEN) 100 UNIT/ML FlexPen Inject into the skin in the morning and at bedtime. Taking 22 units total per day.  . insulin degludec (TRESIBA FLEXTOUCH) 100 UNIT/ML FlexTouch Pen Inject 40 Units into the skin at bedtime. Inject 40 units under the skin at bedtime.  . metFORMIN (GLUCOPHAGE) 1000 MG tablet TAKE ONE TABLET BY MOUTH TWICE DAILY. TAKE WITH A MEAL.  . metoprolol tartrate (LOPRESSOR) 100 MG tablet TAKE 1 TABLET BY MOUTH TWICE A DAY  . nystatin-triamcinolone ointment (MYCOLOG) Apply topically 2 (two) times daily.  . ONGlory RosebushICA LANCETS 33G 46TC Use to check blood sugar twice daily. DX Code E11.9  . ONETOUCH VERIO test strip USE AS INSTRUCTED  . sildenafil (VIAGRA) 50 MG tablet Take 1 tablet (50 mg total) by mouth daily as needed for erectile dysfunction.  . [  DISCONTINUED] ramipril (ALTACE) 10 MG capsule TAKE 1 CAPSULE (10 MG TOTAL) BY MOUTH 2 (TWO) TIMES DAILY.   No facility-administered encounter medications on file as of 11/25/2019.     Current Diagnosis/Assessment:  Goals Addressed            This Visit's Progress   . Pharmacy Care Plan       CARE PLAN ENTRY  Current Barriers:  . Chronic Disease Management support, education, and care coordination needs related to Hypertension, Hyperlipidemia, Diabetes, and  coronary atherosclerosis/ history of MI   Hypertension . Pharmacist Clinical Goal(s): o Over the next 21 days, patient will work with PharmD and providers to achieve BP goal <140/90 . Current regimen:  o amlodipine 87m, 1 tablet once daily o metoprolol tartrate 10101m 1 tablet twice daily o ramipril 1041m1 capsule twice daily  o furosemide 65m41m tablet once daily  . Interventions: o We discussed: DASH diet:  following a diet emphasizing fruits and vegetables and low-fat dairy products along with whole grains, fish, poultry, and nuts. Reducing red meats and sugars.  . Patient self care activities - Over the next 14 days, patient will: o Check BP daily and follow up visit in 2 weeks for reassessment.   Hyperlipidemia/ coronary atherosclerosis/ history of MI . Pharmacist Clinical Goal(s): o Over the next 180 days, patient will work with PharmD and providers to maintain LDL goal < 70 . Current regimen:  o atorvstatin 40mg64mtablet once daily  o aspirin 81mg,40mablet once daily  . Interventions: o We discussed how a diet high in plant sterols (fruits/vegetables/nuts/whole grains/legumes) may reduce your cholesterol.  . Patient self care activities - Over the next 180 days, patient will: o Continue current medications.   Diabetes . Pharmacist Clinical Goal(s): o Over the next 30 days, patient will work with PharmD and providers to achieve A1c goal  as per Dr. Kumar Dwyane Deerrent regimen:  o insulin aspart (Novolog Flexpen), inject 10-15 units before breakfast if needed and 22 units at dinner o insulin degludec (TresiTyler Aasouch), inject 40 units at bedtime  o metformin 1000mg, 95mblet twice daily  . Patient self care activities - Over the next 30 days, patient will: o Check blood sugar twice daily, document, and provide at future appointments o Contact provider with any episodes of hypoglycemia  Gout . Pharmacist Clinical Goal(s) o Over the next 180 days, patient will work with  PharmD and providers to  . Current regimen:  o allopurinol 300mg, 138mlet once daily  o colchicine 0.6mg, 1 t68met once daily as needed . Patient self care activities o Continue current medications.   Medication management . Pharmacist Clinical Goal(s): o Over the next 180 days, patient will work with PharmD and providers to maintain optimal medication adherence . Current pharmacy: [facility]  . Interventions o Comprehensive medication review performed. o Continue current medication management strategy o Apply to Extra Help program  for assistance for medications out of pocket costs.  . Patient self care activities - Over the next 180 days, patient will: o Take medications as prescribed o Report any questions or concerns to PharmD and/or provider(s)  Please see past updates related to this goal by clicking on the "Past Updates" button in the selected goal        SDOH Interventions     Most Recent Value  SDOH Interventions  Financial Strain Interventions  Other (Comment) [Patient provided information to apply to Extra Help.]  SDOH Interventions     Most Recent Value  SDOH Interventions  Financial Strain Interventions  Other (Comment) [Patient provided information to apply to Extra Help.]       Hypertension  Patient reports having blood pressure machine, but does not know to use it.  Patient reported furosemide being for blood pressure; denies swelling.   Office blood pressures are  BP Readings from Last 3 Encounters:  12/06/19 (!) 160/84  10/21/19 (!) 160/78  09/23/19 136/70   Patient checks BP at home currently not checkingk  Patient home BP readings are ranging: currently not checking; has BP monitor, but does not know how to use it.   OV BP: 148/60mHg  Patient is uncontrolled (based on last visit BP) on:  - amlodipine 124m 1 tablet once daily - metoprolol tartrate 10039m1 tablet twice daily - ramipril 50m40m capsule twice daily  - furosemide 20mg29m tablet once daily   We discussed diet and exercise extensively . DASH diet:  following a diet emphasizing fruits and vegetables and low-fat dairy products along with whole grains, fish, poultry, and nuts. Reducing red meats and sugars.  . Exercising     Plan Reviewed proper technique for BP monitoring with patient's monitor.  Patient to review BP readings and follow up visit scheduled in 2 weeks to reassess.  Continue current medications    Follow up Follow up visit in 2 weeks for BP reassessment.  Provided patient information for Extra Help. If denied, provided patient patient assistance forms for Novolog and TresiAntigua and BarbudaAnnetAnson CroftsrmD Clinical Pharmacist [facility])[phone]

## 2019-12-03 ENCOUNTER — Other Ambulatory Visit (INDEPENDENT_AMBULATORY_CARE_PROVIDER_SITE_OTHER): Payer: HMO

## 2019-12-03 ENCOUNTER — Other Ambulatory Visit: Payer: Self-pay

## 2019-12-03 DIAGNOSIS — Z794 Long term (current) use of insulin: Secondary | ICD-10-CM

## 2019-12-03 DIAGNOSIS — E1165 Type 2 diabetes mellitus with hyperglycemia: Secondary | ICD-10-CM | POA: Diagnosis not present

## 2019-12-03 LAB — BASIC METABOLIC PANEL
BUN: 6 mg/dL (ref 6–23)
CO2: 30 mEq/L (ref 19–32)
Calcium: 9.5 mg/dL (ref 8.4–10.5)
Chloride: 102 mEq/L (ref 96–112)
Creatinine, Ser: 0.68 mg/dL (ref 0.40–1.50)
GFR: 137.88 mL/min (ref >60.00)
Glucose, Bld: 110 mg/dL — ABNORMAL HIGH (ref 70–99)
Potassium: 3.7 mEq/L (ref 3.5–5.1)
Sodium: 138 mEq/L (ref 135–145)

## 2019-12-03 LAB — HEMOGLOBIN A1C: Hgb A1c MFr Bld: 8 % — ABNORMAL HIGH (ref 4.6–6.5)

## 2019-12-05 ENCOUNTER — Other Ambulatory Visit: Payer: Self-pay

## 2019-12-05 ENCOUNTER — Telehealth: Payer: Self-pay

## 2019-12-05 NOTE — Telephone Encounter (Signed)
Called patient regarding the patient assistance application that he dropped off. I informed the patient that he did not fill out the patient portion and he stated "well you're supposed to fill that out". Patient was informed that we do not fill out this portion because it is information only he will know, such as his household income and the amount of people that live in his home.  Also informed pt that he mistakenly signed where the provider is supposed to sign. Pt responded "well I have an appt tomorrow, so we'll see."

## 2019-12-06 ENCOUNTER — Encounter: Payer: Self-pay | Admitting: Endocrinology

## 2019-12-06 ENCOUNTER — Ambulatory Visit: Payer: HMO | Admitting: Endocrinology

## 2019-12-06 VITALS — BP 160/84 | HR 70 | Ht 72.0 in | Wt 218.0 lb

## 2019-12-06 DIAGNOSIS — Z794 Long term (current) use of insulin: Secondary | ICD-10-CM | POA: Diagnosis not present

## 2019-12-06 DIAGNOSIS — I152 Hypertension secondary to endocrine disorders: Secondary | ICD-10-CM

## 2019-12-06 DIAGNOSIS — E1159 Type 2 diabetes mellitus with other circulatory complications: Secondary | ICD-10-CM | POA: Diagnosis not present

## 2019-12-06 DIAGNOSIS — E1165 Type 2 diabetes mellitus with hyperglycemia: Secondary | ICD-10-CM | POA: Diagnosis not present

## 2019-12-06 DIAGNOSIS — I10 Essential (primary) hypertension: Secondary | ICD-10-CM | POA: Diagnosis not present

## 2019-12-06 MED ORDER — RAMIPRIL 10 MG PO CAPS: 10.0000 mg | ORAL_CAPSULE | Freq: Every day | ORAL | 1 refills | Status: DC

## 2019-12-06 MED ORDER — SPIRONOLACTONE 25 MG PO TABS: 25.0000 mg | ORAL_TABLET | Freq: Every day | ORAL | 2 refills | Status: DC

## 2019-12-06 NOTE — Patient Instructions (Addendum)
check fasting blood sugar every other day and readings 2 HOURS  after meals at least once a day  Metformin 2x daily with food  TRESIBA 38 UNITS WITH 12 novolog at suppertime and not at bedtime  Novolog 10 units daily at breakfast when eating a bread  Cut back on sweets and drinks  RAMIPRIL 1 DAILY

## 2019-12-06 NOTE — Progress Notes (Signed)
Patient ID: Patrick Wilcox, male   DOB: 08-Feb-1946, 74 y.o.   MRN: [id number]    Reason for Appointment: Followup for Type 2 Diabetes   History of Present Illness:          Diagnosis: Type 2 diabetes mellitus, date of diagnosis: 2004       Past history:  At diagnosis he was having symptoms of drowsiness, difficulty breathing and blood sugar was reportedly over 600 He was initially treated with insulin but subsequently switched back to oral drugs He may have taken metformin and other medications for some time before going back on insulin His records indicate that he had been taking Amaryl and Januvia before going back on insulin in mid-2014 when his A1c had gone up over 13 Initially was given premixed insulin and then switched to Lantus and Humalog Record of his A1c indicates that his levels previously had been over 8% since 02/2012 He  had much better blood sugar control on his last visit with his changing his lifestyle and being instructed by the nurse educator  His Lantus did not appear to have 24 hour control with once a day injection  Recent history:   INSULIN regimen is described as:  40 units Tresiba daily in p.m., Novolog ?  22 units PCS       Oral hypoglycemic drugs the patient is taking are: Metformin 1 g twice a day     His A1c is still high at 8%   Glucose monitoring, current management and problems identified:  He was given written instructions on changing his insulin and taking NovoLog twice a day  However he is still doing the same regimen as before and has not reduced his [name] or started taking NovoLog before breakfast and dinner  He is thinks he takes the NovoLog 1 to 2 hours after eating and not clear why  Although he thinks that sometimes he does not eat much in the morning today he had a fast food breakfast at [employer]  Otherwise will eat biscuit, bacon and eggs  Most of his blood sugars are likely before breakfast also he thinks  occasionally some readings may be after eating  Morning blood sugars are somewhat but not as much as before  He appears to have lost a little weight since March but not recently  Hypoglycemia: None   DIET: Has seen the dietitian in 2014 and nurse educator in 8/18       Meals: 2 meals per day 10 am and 6 pm.   Breakfast is  eggs;  toast, eggs, has fruit for snacks.  Evening meal usually vegetables, tuna salad, chicken.       Compliance with the medical regimen:  Fair    Glucose monitoring: Checking less than 1 times a day        Glucometer:  One Touch Verio    Blood Glucose readings  from  [employer] show   PRE-MEAL Fasting Lunch Dinner Bedtime Overall  Glucose range:  82-164      Mean/median:  114     119   POST-MEAL PC Breakfast PC Lunch PC Dinner  Glucose range:  102-170    Mean/median:      Previous readings:  PRE-MEAL  mornings Lunch Dinner Bedtime Overall  Glucose range: 93-211   ?   Mean/median:  143         Exercise:  walking about a mile, 3-5/7 days, mostly in the morning  Weight history:  Wt Readings  from Last 3 Encounters:  12/06/19 218 lb (98.9 kg)  10/21/19 217 lb 9.6 oz (98.7 kg)  09/23/19 224 lb (101.6 kg)    Glycemic control:   Lab Results  Component Value Date   HGBA1C 8.0 (H) 12/03/2019   HGBA1C 8.2 (H) 09/09/2019   HGBA1C 7.1 (A) 06/19/2018   Lab Results  Component Value Date   MICROALBUR 148.4 (H) 09/09/2019   LDLCALC 71 09/09/2019   CREATININE 0.68 12/03/2019    Other active problems: See review of systems   Lab on 12/03/2019  Component Date Value Ref Range Status  . Sodium 12/03/2019 138  135 - 145 mEq/L Final  . Potassium 12/03/2019 3.7  3.5 - 5.1 mEq/L Final  . Chloride 12/03/2019 102  96 - 112 mEq/L Final  . CO2 12/03/2019 30  19 - 32 mEq/L Final  . Glucose, Bld 12/03/2019 110* 70 - 99 mg/dL Final  . BUN 12/03/2019 6  6 - 23 mg/dL Final  . Creatinine, Ser 12/03/2019 0.68  0.40 - 1.50 mg/dL Final  . GFR 12/03/2019 137.88   >60.00 mL/min Final  . Calcium 12/03/2019 9.5  8.4 - 10.5 mg/dL Final  . Hgb A1c MFr Bld 12/03/2019 8.0* 4.6 - 6.5 % Final   Glycemic Control Guidelines for People with Diabetes:Non Diabetic:  <6%Goal of Therapy: <7%Additional Action Suggested:  >8%       Allergies as of 12/06/2019   No Known Allergies     Medication List       Accurate as of Dec 06, 2019  1:10 PM. If you have any questions, ask your nurse or doctor.        allopurinol 300 MG tablet Commonly known as: ZYLOPRIM TAKE 1 TABLET BY MOUTH EVERY DAY   amLODipine 10 MG tablet Commonly known as: NORVASC TAKE 1 TABLET BY MOUTH EVERY DAY   aspirin 81 MG tablet Take 81 mg by mouth daily.   atorvastatin 40 MG tablet Commonly known as: LIPITOR TAKE 1 TABLET BY MOUTH EVERY DAY   B-D SINGLE USE SWABS REGULAR Pads Use as necessary   colchicine 0.6 MG tablet TAKE 1 TABLET BY MOUTH EVERY DAY What changed: additional instructions   Easy Comfort Pen Needles 31G X 5 MM Misc Generic drug: Insulin Pen Needle Used to check  Blood glucose 3 times daily or prn.   furosemide 20 MG tablet Commonly known as: LASIX TAKE 1 TABLET BY MOUTH EVERY DAY   metFORMIN 1000 MG tablet Commonly known as: GLUCOPHAGE TAKE ONE TABLET BY MOUTH TWICE DAILY. TAKE WITH A MEAL.   metoprolol tartrate 100 MG tablet Commonly known as: LOPRESSOR TAKE 1 TABLET BY MOUTH TWICE A DAY   NovoLOG FlexPen 100 UNIT/ML FlexPen Generic drug: insulin aspart Inject into the skin in the morning and at bedtime. Taking 22 units total per day.   nystatin-triamcinolone ointment Commonly known as: MYCOLOG Apply topically 2 (two) times daily.   OneTouch Delica Lancets 32R Misc Use to check blood sugar twice daily. DX Code E11.9   OneTouch Verio IQ System w/Device Kit USE TO CHECK BLOOD SUGAR TWICE A DAY DX CODE E11.9   OneTouch Verio test strip Generic drug: glucose blood USE AS INSTRUCTED   ramipril 10 MG capsule Commonly known as: ALTACE Take 1  capsule (10 mg total) by mouth daily. What changed: when to take this Changed by: [name], MD   sildenafil 50 MG tablet Commonly known as: VIAGRA Take 1 tablet (50 mg total) by mouth daily as needed  for erectile dysfunction.   spironolactone 25 MG tablet Commonly known as: Aldactone Take 1 tablet (25 mg total) by mouth daily. Started by: [name], MD   [name] 100 UNIT/ML FlexTouch Pen Generic drug: insulin degludec Inject 40 Units into the skin at bedtime. Inject 40 units under the skin at bedtime.       Allergies:  No Known Allergies  Past Medical History:  Diagnosis Date  . Cataract    had surgery to remove them  . CORONARY ARTERY DISEASE 11/23/2006  . DIABETES MELLITUS, TYPE II 11/23/2006  . GOUT 05/20/2008  . HYPERLIPIDEMIA 11/23/2006  . HYPERTENSION 11/23/2006  . 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 PNEUMONIA, LEFT LOWER LOBE 09/08/2009    Past Surgical History:  Procedure Laterality Date  . CORONARY ANGIOPLASTY WITH STENT PLACEMENT    . CORONARY ARTERY BYPASS GRAFT  2003    Family History  Problem Relation Age of Onset  . Heart disease Mother   . Heart disease Father   . Diabetes Sister   . Diabetes Brother     Social History:  reports that he has never smoked. He has never used smokeless tobacco. He reports that he does not drink alcohol or use drugs.    Review of Systems        Lipids:  LDL has been treated to goal with Lipitor 40 mg Has history of CAD, followed by PCP HDL has been low       Lab Results  Component Value Date   CHOL 125 09/09/2019   HDL 31.40 (L) 09/09/2019   LDLCALC 71 09/09/2019   TRIG 114.0 09/09/2019   CHOLHDL 4 09/09/2019       The blood pressure has been treated with Norvasc10 mg, ramipril and metoprolol, Followed by PCP He is supposed to be taking her ramipril twice a day but he says he only does this once a day  Blood pressure is higher again but he has no follow-up appointment scheduled with PCP  until end of July He is also on Lasix for unknown reasons Not taking any blood pressure readings at home  BP Readings from Last 3 Encounters:  12/06/19 (!) 160/84  10/21/19 (!) 160/78  67/67/20 947/09     Complications: No evidence of neuropathy or nephropathy  Diabetic foot exam was done in 11/20 by his PCP  Last exam with eye doctor  was 4/20        LABS:  Lab on 12/03/2019  Component Date Value Ref Range Status  . Sodium 12/03/2019 138  135 - 145 mEq/L Final  . Potassium 12/03/2019 3.7  3.5 - 5.1 mEq/L Final  . Chloride 12/03/2019 102  96 - 112 mEq/L Final  . CO2 12/03/2019 30  19 - 32 mEq/L Final  . Glucose, Bld 12/03/2019 110* 70 - 99 mg/dL Final  . BUN 12/03/2019 6  6 - 23 mg/dL Final  . Creatinine, Ser 12/03/2019 0.68  0.40 - 1.50 mg/dL Final  . GFR 12/03/2019 137.88  >60.00 mL/min Final  . Calcium 12/03/2019 9.5  8.4 - 10.5 mg/dL Final  . Hgb A1c MFr Bld 12/03/2019 8.0* 4.6 - 6.5 % Final   Glycemic Control Guidelines for People with Diabetes:Non Diabetic:  <6%Goal of Therapy: <7%Additional Action Suggested:  >8%     Physical Examination:  BP (!) 160/84 (BP Location: Left Arm, Patient Position: Sitting, Cuff Size: Normal)   Pulse 70   Ht 6' (1.829 m)   Wt 218 lb (98.9 kg)  SpO2 98%   BMI 29.57 kg/m      No ankle edema present  ASSESSMENT/PLAN  Diabetes type 2, On insulin   See history of present illness for detailed discussion of current management, problems identified and blood sugar patterns.  A1c is 8.1, previous fructosamine 315  His history is somewhat difficult to obtain and he tends not to follow instructions for his diabetes regimen despite written directions  He has not changed anything since his last visit as discussed with mealtime insulin, glucose monitoring more doses  Although his fasting readings are slightly better overall his A1c is still high indicating postprandial hyperglycemia He does not monitor readings after meals Again  emphasized the need to take NovoLog before starting to eat and twice a day especially with his eating with consistent amount of carbohydrate and some high-fat foods in the morning He needs to cut back on fast food and high-fat meals overall NovoLog will be 10 at breakfast and 12 at dinnertime and discussed importance of controlling postprandial hyperglycemia with premeal dosing, postprandial blood sugar patterns were demonstrated on patient education material  HYPERTENSION: Has persistently high readings despite multiple medication However not on a diuretic antihypertensive With Lasix his potassium 3.7 and should be able to take Aldactone safely with this He will start with 25 mg and will recheck him in [redacted] weeks along with repeat renal functions       Patient Instructions  check fasting blood sugar every other day and readings 2 HOURS  after meals at least once a day  Metformin 2x daily with food  TRESIBA 38 UNITS WITH 12 novolog at suppertime and not at bedtime  Novolog 10 units daily at breakfast when eating a bread  Cut back on sweets and drinks  RAMIPRIL 1 DAILY        [name] 12/06/2019, 1:10 PM   Note: This office note was prepared with Dragon voice recognition system technology. Any transcriptional errors that result from this process are unintentional.

## 2019-12-12 ENCOUNTER — Telehealth: Payer: Self-pay

## 2019-12-12 NOTE — Telephone Encounter (Signed)
FAXED [place]: [employer] Patient Assistance foundation  Document: application for medication assistance Other records requested: none at this time.  All above requested information has been faxed successfully to [employer] listed above. Documents and fax confirmation have been placed in the faxed file for future reference.

## 2019-12-16 ENCOUNTER — Other Ambulatory Visit: Payer: Self-pay | Admitting: Family Medicine

## 2019-12-16 ENCOUNTER — Ambulatory Visit: Payer: HMO

## 2019-12-16 ENCOUNTER — Telehealth: Payer: Self-pay

## 2019-12-16 ENCOUNTER — Other Ambulatory Visit: Payer: Self-pay

## 2019-12-16 DIAGNOSIS — E1169 Type 2 diabetes mellitus with other specified complication: Secondary | ICD-10-CM

## 2019-12-16 DIAGNOSIS — M1A9XX0 Chronic gout, unspecified, without tophus (tophi): Secondary | ICD-10-CM

## 2019-12-16 DIAGNOSIS — Z794 Long term (current) use of insulin: Secondary | ICD-10-CM

## 2019-12-16 DIAGNOSIS — I2510 Atherosclerotic heart disease of native coronary artery without angina pectoris: Secondary | ICD-10-CM

## 2019-12-16 DIAGNOSIS — E7800 Pure hypercholesterolemia, unspecified: Secondary | ICD-10-CM

## 2019-12-16 DIAGNOSIS — E1159 Type 2 diabetes mellitus with other circulatory complications: Secondary | ICD-10-CM

## 2019-12-16 MED ORDER — SILDENAFIL CITRATE 50 MG PO TABS: 50.0000 mg | ORAL_TABLET | Freq: Every day | ORAL | 1 refills | Status: DC | PRN

## 2019-12-16 NOTE — Patient Instructions (Addendum)
Visit Information  Goals Addressed            This Visit's Progress   . Pharmacy Care Plan       CARE PLAN ENTRY  Current Barriers:  . Chronic Disease Management support, education, and care coordination needs related to Hypertension, Hyperlipidemia, Diabetes, and coronary atherosclerosis/ history of MI   Hypertension . Pharmacist Clinical Goal(s): o Over the next 28 days, patient will work with PharmD and providers to achieve BP goal <140/90 . Current regimen:  o amlodipine 10mg , 1 tablet once daily o metoprolol tartrate 100mg , 1 tablet twice daily o ramipril 10mg , 1 capsule once daily  o furosemide 20mg , 1 tablet once daily  o Spironolactone 25mg , 1 tablet once daily . Interventions: o We discussed: DASH diet:  following a diet emphasizing fruits and vegetables and low-fat dairy products along with whole grains, fish, poultry, and nuts. Reducing red meats and sugars.  . Patient self care activities - Over the next 28 days, patient will: o Check BP daily and follow up visit in 4 weeks for reassessment.   Hyperlipidemia/ coronary atherosclerosis/ history of MI . Pharmacist Clinical Goal(s): o Over the next 180 days, patient will work with PharmD and providers to maintain LDL goal < 70 . Current regimen:  o atorvstatin 40mg , 1 tablet once daily  o aspirin 81mg , 1 tablet once daily  . Interventions: o We discussed how a diet high in plant sterols (fruits/vegetables/nuts/whole grains/legumes) may reduce your cholesterol.  . Patient self care activities - Over the next 180 days, patient will: o Continue current medications.   Diabetes . Pharmacist Clinical Goal(s): o Over the next 30 days, patient will work with PharmD and providers to achieve A1c goal  as per Dr. Dwyane Dee  . Current regimen:  o insulin aspart (Novolog Flexpen), inject 10 units before breakfast if needed and 12 units at dinner o insulin degludec [name]), inject 38 units at bedtime  o metformin  1000mg , 1 tablet twice daily  . Patient self care activities - Over the next 30 days, patient will: o Check blood sugar twice daily, document, and provide at future appointments o Contact provider with any episodes of hypoglycemia  Gout . Pharmacist Clinical Goal(s) o Over the next 180 days, patient will work with PharmD and providers to  . Current regimen:  o allopurinol 300mg , 1 tablet once daily  o colchicine 0.6mg , 1 tablet once daily as needed . Patient self care activities o Continue current medications.   Medication management . Pharmacist Clinical Goal(s): o Over the next 180 days, patient will work with PharmD and providers to maintain optimal medication adherence . Current [facility]  . Interventions o Comprehensive medication review performed. o Continue current medication management strategy o Look for letter Extra Help program and access acceptance.   . Patient self care activities - Over the next 180 days, patient will: o Take medications as prescribed o Report any questions or concerns to PharmD and/or provider(s)  Please see past updates related to this goal by clicking on the "Past Updates" button in the selected goal         Patrick Wilcox was given information about Chronic Care Management services today including:  1. CCM service includes personalized support from designated clinical staff supervised by his physician, including individualized plan of care and coordination with other care providers 2. 24/7 contact phone numbers for assistance for urgent and routine care needs. 3. Standard insurance, coinsurance, copays and deductibles apply for chronic  care management only during months in which we provide at least 20 minutes of these services. Most insurances cover these services at 100%, however patients may be responsible for any copay, coinsurance and/or deductible if applicable. This service may help you avoid the need for more expensive face-to-face  services. 4. Only one practitioner may furnish and bill the service in a calendar month. 5. The patient may stop CCM services at any time (effective at the end of the month) by phone call to the office staff.  Patient agreed to services and verbal consent obtained.   The patient verbalized understanding of instructions provided today and agreed to receive a mailed copy of patient instruction and/or educational materials. Telephone follow up appointment with pharmacy team member scheduled for: 01/16/2020  Anson Crofts, PharmD Clinical Pharmacist [facility] [place] [phone]    Hypertension, Adult Hypertension is another name for high blood pressure. High blood pressure forces your heart to work harder to pump blood. This can cause problems over time. There are two numbers in a blood pressure reading. There is a top number (systolic) over a bottom number (diastolic). It is best to have a blood pressure that is below 120/80. Healthy choices can help lower your blood pressure, or you may need medicine to help lower it. What are the causes? The cause of this condition is not known. Some conditions may be related to high blood pressure. What increases the risk?  Smoking.  Having type 2 diabetes mellitus, high cholesterol, or both.  Not getting enough exercise or physical activity.  Being overweight.  Having too much fat, sugar, calories, or salt (sodium) in your diet.  Drinking too much alcohol.  Having long-term (chronic) kidney disease.  Having a family history of high blood pressure.  Age. Risk increases with age.  Race. You may be at higher risk if you are African American.  Gender. Men are at higher risk than women before age 76. After age 58, women are at higher risk than men.  Having obstructive sleep apnea.  Stress. What are the signs or symptoms?  High blood pressure may not cause symptoms. Very high blood pressure (hypertensive crisis)  may cause: ? Headache. ? Feelings of worry or nervousness (anxiety). ? Shortness of breath. ? Nosebleed. ? A feeling of being sick to your stomach (nausea). ? Throwing up (vomiting). ? Changes in how you see. ? Very bad chest pain. ? Seizures. How is this treated?  This condition is treated by making healthy lifestyle changes, such as: ? Eating healthy foods. ? Exercising more. ? Drinking less alcohol.  Your health care provider may prescribe medicine if lifestyle changes are not enough to get your blood pressure under control, and if: ? Your top number is above 130. ? Your bottom number is above 80.  Your personal target blood pressure may vary. Follow these instructions at home: Eating and drinking   If told, follow the DASH eating plan. To follow this plan: ? Fill one half of your plate at each meal with fruits and vegetables. ? Fill one fourth of your plate at each meal with whole grains. Whole grains include whole-wheat pasta, brown rice, and whole-grain bread. ? Eat or drink low-fat dairy products, such as skim milk or low-fat yogurt. ? Fill one fourth of your plate at each meal with low-fat (lean) proteins. Low-fat proteins include fish, chicken without skin, eggs, beans, and tofu. ? Avoid fatty meat, cured and processed meat, or chicken with skin. ?  Avoid pre-made or processed food.  Eat less than 1,500 mg of salt each day.  Do not drink alcohol if: ? Your doctor tells you not to drink. ? You are pregnant, may be pregnant, or are planning to become pregnant.  If you drink alcohol: ? Limit how much you use to:  0-1 drink a day for women.  0-2 drinks a day for men. ? Be aware of how much alcohol is in your drink. In the [place] one drink equals one 12 oz bottle of beer (355 mL), one 5 oz glass of wine (148 mL), or one 1 oz glass of hard liquor (44 mL). Lifestyle   Work with your doctor to stay at a healthy weight or to lose weight. Ask your doctor what the best  weight is for you.  Get at least 30 minutes of exercise most days of the week. This may include walking, swimming, or biking.  Get at least 30 minutes of exercise that strengthens your muscles (resistance exercise) at least 3 days a week. This may include lifting weights or doing Pilates.  Do not use any products that contain nicotine or tobacco, such as cigarettes, e-cigarettes, and chewing tobacco. If you need help quitting, ask your doctor.  Check your blood pressure at home as told by your doctor.  Keep all follow-up visits as told by your doctor. This is important. Medicines  Take over-the-counter and prescription medicines only as told by your doctor. Follow directions carefully.  Do not skip doses of blood pressure medicine. The medicine does not work as well if you skip doses. Skipping doses also puts you at risk for problems.  Ask your doctor about side effects or reactions to medicines that you should watch for. Contact a doctor if you:  Think you are having a reaction to the medicine you are taking.  Have headaches that keep coming back (recurring).  Feel dizzy.  Have swelling in your ankles.  Have trouble with your vision. Get help right away if you:  Get a very bad headache.  Start to feel mixed up (confused).  Feel weak or numb.  Feel faint.  Have very bad pain in your: ? Chest. ? Belly (abdomen).  Throw up more than once.  Have trouble breathing. Summary  Hypertension is another name for high blood pressure.  High blood pressure forces your heart to work harder to pump blood.  For most people, a normal blood pressure is less than 120/80.  Making healthy choices can help lower blood pressure. If your blood pressure does not get lower with healthy choices, you may need to take medicine. This information is not intended to replace advice given to you by your health care provider. Make sure you discuss any questions you have with your health care  provider. Document Revised: 03/07/2018 Document Reviewed: 03/07/2018 Elsevier Patient Education  2020 [employer].

## 2019-12-16 NOTE — Telephone Encounter (Signed)
Please advise 

## 2019-12-16 NOTE — Telephone Encounter (Signed)
At [facility] visit, Patient requested refill for sildenafil 50mg  for 90 day supply be sent to [facility]. He has a Goodrx coupon for this quantity.   Thanks!

## 2019-12-16 NOTE — Telephone Encounter (Signed)
Noted  

## 2019-12-16 NOTE — Telephone Encounter (Signed)
Rx sent. Thanks, BJ 

## 2019-12-16 NOTE — Chronic Care Management (AMB) (Addendum)
Chronic Care Management Pharmacy  Name: Patrick Wilcox  MRN: [id number] DOB: [birth year]  Initial Questions: 1. Have you seen any other providers since your last visit? Yes - Dr. Dwyane Dee 2. Any changes in your medicines or health? No   Chief Complaint/ HPI  Patrick Wilcox,  74 y.o. , male presents for their Follow-Up CCM visit with the clinical pharmacist via telephone due to COVID-19 Pandemic. Patient presented for BP readings review and patient assistance program update.   Patient reported biggest concern is insulin costs, but has submitted patient assistance forms to Dr. Ronnie Derby office. Patient reported has not received notification from Extra Help program.   Patient requested refill for [facility] since he will be using Goodrx coupon.   PCP : [name], [name], MD   Their chronic conditions include: HTN, HLD/ coronary atherosclerosis/ history of MI, DM, Gout  Office Visits:  09/23/2019- Patient presented for office visit with Dr. [name], MD for follow up. For DM, patient is on Novolog 22 units after meals and Tresiba 40 units. Reported BGs: 80s-110s. A1c: 8.2 (09/09/2019). For HTN, BP at office visit: 136/70. Patient on amlodipine 12m, metoprolol tartrate 1049mBID, ramipril 1068mBID. DM: since A1c is not at goal, patient to arrange att with endocrinologist. No changes on HTN, HLD, gout regimen. Patient to continue low fat diet and low salt diet. Patient to return in 4 months for CPE.   Consult Visit: 12/06/2019- Endocrinology- Patient presented for office visit for follow up. OV BP: 160/84 mmHg. Spironolactone 67m65marted. Patient found to be taking ramipril once daily instead of BID on directions. Patient to continue ramipril once daily. Recheck in 3 weeks as well as repeat renal function.   10/21/2019- Endocrinology- Patient presented for office visit with Dr. AjayElayne Snare. BP: 160/78mm54mor uncontrolled type 2 diabetes mellitus. Patient counseled on nature of  mealtime insulin compared to basal insulin. Does not check BG readings; importance was discussed. Patient was reassured metformin does not cause headaches and needs to take the 2 tablets separately. Patient to follow up with PCP on HTN regimen. Consider switching Norvasc to diltiazem for better renal effects.   Medications: Outpatient Encounter Medications as of 12/16/2019  Medication Sig  . Alcohol Swabs (B-D SINGLE USE SWABS REGULAR) PADS Use as necessary  . allopurinol (ZYLOPRIM) 300 MG tablet TAKE 1 TABLET BY MOUTH EVERY DAY  . amLODipine (NORVASC) 10 MG tablet TAKE 1 TABLET BY MOUTH EVERY DAY  . aspirin 81 MG tablet Take 81 mg by mouth daily.  . atoMarland Kitchenvastatin (LIPITOR) 40 MG tablet TAKE 1 TABLET BY MOUTH EVERY DAY  . Blood Glucose Monitoring Suppl (ONETOUCH VERIO IQ SYSTEM) w/Device KIT USE TO CHECK BLOOD SUGAR TWICE A DAY DX CODE E11.9  . colchicine 0.6 MG tablet TAKE 1 TABLET BY MOUTH EVERY DAY (Patient taking differently: TAKE 1 TABLET BY MOUTH EVERY DAY AS NEEDED)  . EASY COMFORT PEN NEEDLES 31G X 5 MM MISC Used to check  Blood glucose 3 times daily or prn.  . furosemide (LASIX) 20 MG tablet TAKE 1 TABLET BY MOUTH EVERY DAY  . insulin aspart (NOVOLOG FLEXPEN) 100 UNIT/ML FlexPen Inject into the skin in the morning and at bedtime. Taking 22 units total per day.  . insulin degludec (TRESIBA FLEXTOUCH) 100 UNIT/ML FlexTouch Pen Inject 40 Units into the skin at bedtime. Inject 40 units under the skin at bedtime.  . metFORMIN (GLUCOPHAGE) 1000 MG tablet TAKE ONE TABLET BY MOUTH TWICE DAILY.  TAKE WITH A MEAL.  . metoprolol tartrate (LOPRESSOR) 100 MG tablet TAKE 1 TABLET BY MOUTH TWICE A DAY  . nystatin-triamcinolone ointment (MYCOLOG) Apply topically 2 (two) times daily.  Glory Rosebush DELICA LANCETS 22L MISC Use to check blood sugar twice daily. DX Code E11.9  . ONETOUCH VERIO test strip USE AS INSTRUCTED  . ramipril (ALTACE) 10 MG capsule Take 1 capsule (10 mg total) by mouth daily.  .  sildenafil (VIAGRA) 50 MG tablet Take 1 tablet (50 mg total) by mouth daily as needed for erectile dysfunction.  Marland Kitchen spironolactone (ALDACTONE) 25 MG tablet Take 1 tablet (25 mg total) by mouth daily. (Patient not taking: Reported on 12/16/2019)   No facility-administered encounter medications on file as of 12/16/2019.     Current Diagnosis/Assessment:  Goals Addressed            This Visit's Progress   . Pharmacy Care Plan       CARE PLAN ENTRY  Current Barriers:  . Chronic Disease Management support, education, and care coordination needs related to Hypertension, Hyperlipidemia, Diabetes, and coronary atherosclerosis/ history of MI, gout   Hypertension . Pharmacist Clinical Goal(s): o Over the next 28 days, patient will work with PharmD and providers to achieve BP goal <140/90 . Current regimen:  o amlodipine 80m, 1 tablet once daily o metoprolol tartrate 1070m 1 tablet twice daily o ramipril 1047m1 capsule once daily  o furosemide 87m60m tablet once daily  o Spironolactone 25mg18mtablet once daily . Interventions: o We discussed: DASH diet:  following a diet emphasizing fruits and vegetables and low-fat dairy products along with whole grains, fish, poultry, and nuts. Reducing red meats and sugars.  . Patient self care activities - Over the next 28 days, patient will: o Check BP daily and follow up visit in 4 weeks for reassessment.   Hyperlipidemia/ coronary atherosclerosis/ history of MI . Pharmacist Clinical Goal(s): o Over the next 180 days, patient will work with PharmD and providers to maintain LDL goal < 70 . Current regimen:  o atorvstatin 40mg,40mablet once daily  o aspirin 81mg, 15mblet once daily  . Interventions: o We discussed how a diet high in plant sterols (fruits/vegetables/nuts/whole grains/legumes) may reduce your cholesterol.  . Patient self care activities - Over the next 180 days, patient will: o Continue current medications.    Diabetes . Pharmacist Clinical Goal(s): o Over the next 30 days, patient will work with PharmD and providers to achieve A1c goal  as per Dr. Kumar  Dwyane Deerent regimen:  o insulin aspart (Novolog Flexpen), inject 10 units before breakfast if needed and 12 units at dinner o insulin degludec (TresibTyler Aasuch), inject 38 units at bedtime  o metformin 1000mg, 168mlet twice daily  . Patient self care activities - Over the next 30 days, patient will: o Check blood sugar twice daily, document, and provide at future appointments o Contact provider with any episodes of hypoglycemia  Gout . Pharmacist Clinical Goal(s) o Over the next 180 days, patient will work with PharmD and providers to  . Current regimen:  o allopurinol 300mg, 1 13met once daily  o colchicine 0.6mg, 1 ta35mt once daily as needed . Patient self care activities o Continue current medications.   Medication management . Pharmacist Clinical Goal(s): o Over the next 180 days, patient will work with PharmD and providers to maintain optimal medication adherence . [facility]  . Interventions o Comprehensive medication review performed. o Continue  current medication management strategy o Look for letter Extra Help program and access acceptance.   . Patient self care activities - Over the next 180 days, patient will: o Take medications as prescribed o Report any questions or concerns to PharmD and/or provider(s)  Please see past updates related to this goal by clicking on the "Past Updates" button in the selected goal        SDOH Interventions     Most Recent Value  SDOH Interventions  Financial Strain Interventions  Other (Comment) [Patient completed NovoNordisk patient assistance application with Dr. Ronnie Derby office.]  Transportation Interventions  Intervention Not Indicated      SDOH Interventions     Most Recent Value  SDOH Interventions  Financial Strain Interventions  Other (Comment) [Patient  completed NovoNordisk patient assistance application with Dr. Ronnie Derby office.]  Transportation Interventions  Intervention Not Indicated       Hypertension  Patient reports only checking BP once at home since last CCM visit. Patient states difficulty checking on his own and will be getting help from someone.   Denies dizziness/ lightheadedness.   Patient did not remember new BP medicine (spironolactone) prescribed at last visit with Dr. Dwyane Dee.   Office blood pressures are  BP Readings from Last 3 Encounters:  12/06/19 (!) 160/84  10/21/19 (!) 160/78  09/23/19 136/70   Patient checks BP at home currently not checking- will be coordinating with a friend to help him.   Patient home BP readings are ranging: currently not checking  Patient is uncontrolled (based on last visit BP) on:  - amlodipine 82m, 1 tablet once daily - metoprolol tartrate 1070m 1 tablet twice daily - ramipril 1069m1 capsule twice daily  - furosemide 60m43m tablet once daily  - sprironolactone  25mg55mtablet once daily (PATIENT STATES HAS NOT PICKED UP SINCE HE DIDN'T KNOW IT WAS PRESCRIBED)   We discussed:  . Reviewed different BP medications and instructed [facility].  . Since patient has only been taking ramipril once daily, instructed patient to continue as is since on bottle stated BID (as advised by Dr. KumarDwyane Deean Patient to [facility] at [facility] and follow up with Dr. KumarDwyane Dee/24 for BP review.  Patient to continue checking BP readings at home and follow up visit scheduled in 2 weeks after Dr. KumarRonnie Derbyt to reassess.  Continue current medications     Diabetes  Patient's biggest concern was insulin cost. Patient assistance forms were faxed by Dr. KumarRonnie Derbyce on 12/13/19.   Patient did not know outcome on Extra Help program application.   Recent Relevant Labs: Lab Results  Component Value Date/Time   HGBA1C 8.0 (H) 12/03/2019 09:36 AM   HGBA1C 8.2 (H)  09/09/2019 09:47 AM   MICROALBUR 148.4 (H) 09/09/2019 09:47 AM   MICROALBUR 6.8 (H) 03/16/2018 09:31 AM    Checking BG: 2x per Day  Recent FBG Readings: 111 mg/dl.  Patient is currently uncontrolled on the following medications:   insulin aspart (Novolog Flexpen), inject 10 units at breakfast if needed and 12 units at suppertime   insulin degludec (TresTyler Aastouch), inject 38 units at suppertime   metformin 1000mg,71mablet twice daily   Last diabetic Foot exam:  Lab Results  Component Value Date/Time   HMDIABEYEEXA No Retinopathy 10/09/2017 12:00 AM    Last diabetic Eye exam:  Lab Results  Component Value Date/Time   HMDIABFOOTEX done 08/22/2011 12:00 AM     Plan  Managed by endo (Dr. Dwyane Dee).  Follow up on patient assistance program at next follow up visit.  Patient to check in mail for Extra Help Letter.  Continue current medications   Follow up Follow up visit in 4 weeks for BP reassessment.  Requesting refill for sildenafil for 90 day supply be [facility] to [facility] per patient request.   Anson Crofts, PharmD  Clinical Pharmacist [facility] [place] [phone]

## 2019-12-24 ENCOUNTER — Ambulatory Visit: Payer: Self-pay

## 2019-12-30 ENCOUNTER — Other Ambulatory Visit: Payer: HMO

## 2019-12-31 ENCOUNTER — Ambulatory Visit: Payer: Self-pay

## 2019-12-31 ENCOUNTER — Other Ambulatory Visit (INDEPENDENT_AMBULATORY_CARE_PROVIDER_SITE_OTHER): Payer: HMO

## 2019-12-31 ENCOUNTER — Other Ambulatory Visit: Payer: Self-pay

## 2019-12-31 DIAGNOSIS — E1165 Type 2 diabetes mellitus with hyperglycemia: Secondary | ICD-10-CM | POA: Diagnosis not present

## 2019-12-31 DIAGNOSIS — Z794 Long term (current) use of insulin: Secondary | ICD-10-CM | POA: Diagnosis not present

## 2019-12-31 LAB — BASIC METABOLIC PANEL
BUN: 10 mg/dL (ref 6–23)
CO2: 28 mEq/L (ref 19–32)
Calcium: 9.4 mg/dL (ref 8.4–10.5)
Chloride: 101 mEq/L (ref 96–112)
Creatinine, Ser: 0.79 mg/dL (ref 0.40–1.50)
GFR: 115.95 mL/min (ref >60.00)
Glucose, Bld: 125 mg/dL — ABNORMAL HIGH (ref 70–99)
Potassium: 3.7 mEq/L (ref 3.5–5.1)
Sodium: 136 mEq/L (ref 135–145)

## 2020-01-01 ENCOUNTER — Other Ambulatory Visit: Payer: Self-pay

## 2020-01-01 LAB — FRUCTOSAMINE: Fructosamine: 296 umol/L — ABNORMAL HIGH (ref 0–285)

## 2020-01-01 NOTE — Progress Notes (Signed)
Patient ID: Patrick Wilcox, male   DOB: [birth year], 74 y.o.   MRN: [id number]    Reason for Appointment: Followup for Type 2 Diabetes   History of Present Illness:          Diagnosis: Type 2 diabetes mellitus, date of diagnosis: 2004       Past history:  At diagnosis he was having symptoms of drowsiness, difficulty breathing and blood sugar was reportedly over 600 He was initially treated with insulin but subsequently switched back to oral drugs He may have taken metformin and other medications for some time before going back on insulin His records indicate that he had been taking Amaryl and Januvia before going back on insulin in mid-2014 when his A1c had gone up over 13 Initially was given premixed insulin and then switched to Lantus and Humalog Record of his A1c indicates that his levels previously had been over 8% since 02/2012 He  had much better blood sugar control on his last visit with his changing his lifestyle and being instructed by the nurse educator  His Lantus did not appear to have 24 hour control with once a day injection  Recent history:   INSULIN regimen is described as:  40 units Tresiba daily in a.m., Novolog 0- 30 units twice daily       Oral hypoglycemic drugs the patient is taking are: Metformin 1 g twice a day     His A1c is last high at 8% and fructosamine is relatively better at 296   Glucose monitoring, current management and problems identified:  He was given the doses for his NovoLog but he is still arbitrarily taking this  He may not always take it in the evening  He says he is adjusting it based on his blood sugar level but is checking glucose only in the morning; he thinks he may be taking up to 30 units on some days  No hypoglycemic symptoms reported  However he is doing a little better with taking the NovoLog before starting to eat instead of after  Also taking [name] in the morning on his own instead of evening  Blood sugars  are fluctuating in the morning likely from variable mealtime coverage, lowest reading 103  He is trying to do some walking recently  Hypoglycemia: None   DIET: Has seen the dietitian in 2014 and nurse educator in 8/18       Meals: 2 meals per day 10 am and 6 pm.   Breakfast is  eggs;  toast, eggs, has fruit for snacks.  Evening meal usually vegetables, tuna salad, chicken.       Compliance with the medical regimen:  Fair    Glucose monitoring: Checking less than 1 times a day        Glucometer:  One Touch Verio    Blood Glucose readings  from  Download show  FASTING range 103-171 AVERAGE 138  Previous readings:  PRE-MEAL Fasting Lunch Dinner Bedtime Overall  Glucose range:  82-164      Mean/median:  114     119   POST-MEAL PC Breakfast PC Lunch PC Dinner  Glucose range:  102-170    Mean/median:      Previous readings:  PRE-MEAL  mornings Lunch Dinner Bedtime Overall  Glucose range: 93-211   ?   Mean/median:  143         Exercise:  walking about a mile, 3-5/7 days, mostly in the morning  Weight history:  Wt  Readings from Last 3 Encounters:  01/02/20 219 lb (99.3 kg)  12/06/19 218 lb (98.9 kg)  10/21/19 217 lb 9.6 oz (98.7 kg)    Glycemic control:   Lab Results  Component Value Date   HGBA1C 8.0 (H) 12/03/2019   HGBA1C 8.2 (H) 09/09/2019   HGBA1C 7.1 (A) 06/19/2018   Lab Results  Component Value Date   MICROALBUR 148.4 (H) 09/09/2019   LDLCALC 71 09/09/2019   CREATININE 0.79 12/31/2019    Other active problems: See review of systems   Lab on 12/31/2019  Component Date Value Ref Range Status  . Sodium 12/31/2019 136  135 - 145 mEq/L Final  . Potassium 12/31/2019 3.7  3.5 - 5.1 mEq/L Final  . Chloride 12/31/2019 101  96 - 112 mEq/L Final  . CO2 12/31/2019 28  19 - 32 mEq/L Final  . Glucose, Bld 12/31/2019 125* 70 - 99 mg/dL Final  . BUN 12/31/2019 10  6 - 23 mg/dL Final  . Creatinine, Ser 12/31/2019 0.79  0.40 - 1.50 mg/dL Final  . GFR  12/31/2019 115.95  >60.00 mL/min Final  . Calcium 12/31/2019 9.4  8.4 - 10.5 mg/dL Final  . Fructosamine 12/31/2019 296* 0 - 285 umol/L Final   Comment: Published reference interval for apparently healthy subjects between age 54 and 33 is 55 - 285 umol/L and in a poorly controlled diabetic population is 228 - 563 umol/L with a mean of 396 umol/L.       Allergies as of 01/02/2020   No Known Allergies     Medication List       Accurate as of January 02, 2020  8:34 AM. If you have any questions, ask your nurse or doctor.        allopurinol 300 MG tablet Commonly known as: ZYLOPRIM TAKE 1 TABLET BY MOUTH EVERY DAY   amLODipine 10 MG tablet Commonly known as: NORVASC TAKE 1 TABLET BY MOUTH EVERY DAY   aspirin 81 MG tablet Take 81 mg by mouth daily.   atorvastatin 40 MG tablet Commonly known as: LIPITOR TAKE 1 TABLET BY MOUTH EVERY DAY   B-D SINGLE USE SWABS REGULAR Pads Use as necessary   colchicine 0.6 MG tablet TAKE 1 TABLET BY MOUTH EVERY DAY What changed: additional instructions   Easy Comfort Pen Needles 31G X 5 MM Misc Generic drug: Insulin Pen Needle Used to check  Blood glucose 3 times daily or prn.   furosemide 20 MG tablet Commonly known as: LASIX TAKE 1 TABLET BY MOUTH EVERY DAY   metFORMIN 1000 MG tablet Commonly known as: GLUCOPHAGE TAKE ONE TABLET BY MOUTH TWICE DAILY. TAKE WITH A MEAL.   metoprolol tartrate 100 MG tablet Commonly known as: LOPRESSOR TAKE 1 TABLET BY MOUTH TWICE A DAY   NovoLOG FlexPen 100 UNIT/ML FlexPen Generic drug: insulin aspart Inject into the skin in the morning and at bedtime. Taking 22 units total per day.   nystatin-triamcinolone ointment Commonly known as: MYCOLOG Apply topically 2 (two) times daily.   OneTouch Delica Lancets 65K Misc Use to check blood sugar twice daily. DX Code E11.9   OneTouch Verio IQ System w/Device Kit USE TO CHECK BLOOD SUGAR TWICE A DAY DX CODE E11.9   OneTouch Verio test  strip Generic drug: glucose blood USE AS INSTRUCTED   ramipril 10 MG capsule Commonly known as: ALTACE Take 1 capsule (10 mg total) by mouth daily.   sildenafil 50 MG tablet Commonly known as: VIAGRA Take 1 tablet (50 mg  total) by mouth daily as needed for erectile dysfunction.   spironolactone 25 MG tablet Commonly known as: Aldactone Take 1 tablet (25 mg total) by mouth daily.   [name] 100 UNIT/ML FlexTouch Pen Generic drug: insulin degludec Inject 40 Units into the skin at bedtime. Inject 40 units under the skin at bedtime.       Allergies:  No Known Allergies  Past Medical History:  Diagnosis Date  . Cataract    had surgery to remove them  . CORONARY ARTERY DISEASE 11/23/2006  . DIABETES MELLITUS, TYPE II 11/23/2006  . GOUT 05/20/2008  . HYPERLIPIDEMIA 11/23/2006  . HYPERTENSION 11/23/2006  . 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 PNEUMONIA, LEFT LOWER LOBE 09/08/2009    Past Surgical History:  Procedure Laterality Date  . CORONARY ANGIOPLASTY WITH STENT PLACEMENT    . CORONARY ARTERY BYPASS GRAFT  2003    Family History  Problem Relation Age of Onset  . Heart disease Mother   . Heart disease Father   . Diabetes Sister   . Diabetes Brother     Social History:  reports that he has never smoked. He has never used smokeless tobacco. He reports that he does not drink alcohol and does not use drugs.    Review of Systems        Lipids:  LDL has been treated to goal with Lipitor 40 mg Has history of CAD, followed by PCP HDL has been low       Lab Results  Component Value Date   CHOL 125 09/09/2019   HDL 31.40 (L) 09/09/2019   LDLCALC 71 09/09/2019   TRIG 114.0 09/09/2019   CHOLHDL 4 09/09/2019       The blood pressure has been treated with Norvasc10 mg, ramipril and metoprolol, Followed by PCP He is supposed to be taking her ramipril twice a day but he says he only does this once a day  Blood pressure is improved with adding Aldactone  25 mg  He is also on Lasix for unknown reasons Not checking any blood pressure readings at home, he says he has difficulty using his meter  BP Readings from Last 3 Encounters:  01/02/20 140/76  12/06/19 (!) 160/84  10/21/19 (!) 650/35     Complications: No evidence of neuropathy or nephropathy  Diabetic foot exam was done in 11/20 by his PCP  Last exam with eye doctor  was 4/20        LABS:  Lab on 12/31/2019  Component Date Value Ref Range Status  . Sodium 12/31/2019 136  135 - 145 mEq/L Final  . Potassium 12/31/2019 3.7  3.5 - 5.1 mEq/L Final  . Chloride 12/31/2019 101  96 - 112 mEq/L Final  . CO2 12/31/2019 28  19 - 32 mEq/L Final  . Glucose, Bld 12/31/2019 125* 70 - 99 mg/dL Final  . BUN 12/31/2019 10  6 - 23 mg/dL Final  . Creatinine, Ser 12/31/2019 0.79  0.40 - 1.50 mg/dL Final  . GFR 12/31/2019 115.95  >60.00 mL/min Final  . Calcium 12/31/2019 9.4  8.4 - 10.5 mg/dL Final  . Fructosamine 12/31/2019 296* 0 - 285 umol/L Final   Comment: Published reference interval for apparently healthy subjects between age 37 and 64 is 40 - 285 umol/L and in a poorly controlled diabetic population is 228 - 563 umol/L with a mean of 396 umol/L.     Physical Examination:  BP 140/76 (BP Location: Left Arm, Patient Position: Sitting, Cuff Size: Normal)  Pulse 69   Ht 6' (1.829 m)   Wt 219 lb (99.3 kg)   SpO2 97%   BMI 29.70 kg/m      ASSESSMENT/PLAN  Diabetes type 2, On insulin   See history of present illness for detailed discussion of current management, problems identified and blood sugar patterns.  A1c last was 8.1, the fructosamine 296 compared to 315  His blood sugars are overall appearing better This may be from trying to take his NovoLog more consistently before eating However as discussed above he has poor understanding of how to adjust his NovoLog dose and may do better with a fixed dose twice a day Not able to monitor blood sugars after meals to help [place]  adjust his NovoLog  Recommendations: Needs to check readings after meals consistently NovoLog will be 10-12 units at breakfast and at dinnertime based on meal size  and discussed importance of controlling postprandial hyperglycemia with premeal dosing, postprandial blood sugar patterns were reviewed on patient education material and this was given to him  Since he needs more reinforcement of this education he will be referred to the diabetes nurse  HYPERTENSION: Has better readings and stable potassium with adding Aldactone and he will continue in addition to other medications      There are no Patient Instructions on file for this visit.       [name] 01/02/2020, 8:34 AM   Note: This office note was prepared with Dragon voice recognition system technology. Any transcriptional errors that result from this process are unintentional.

## 2020-01-01 NOTE — Patient Outreach (Signed)
[facility]) Care Management Chronic Special Needs Program  01/01/2020  Name: Patrick Wilcox DOB: 31-Jan-1946  MRN: [id number]  Patrick Wilcox is enrolled in a chronic special needs plan for Diabetes. Telephone call to client for CSNP assessment follow up. HIPAA verified. Client reports he is doing well. He states he is independent in his care and takes care of himself. Client reports walking everyday for approximately 1 hour and watches what he eats. Client states he is following up with the pharmacist at his doctors office for medication assistance for his insulin and blood pressure checks. Client states he currently has his medications and takes them as prescribed.  Client states he is not sure if his home blood pressure monitor is worker correctly. RN advised client to take the monitor with him to his next doctor appointment. Client verbalized understanding and states he has a follow up appointment with Dr. Dwyane Dee on tomorrow.  Client reports his blood sugar was 112 fasting and denies any blood sugar readings below 70.     Goals Addressed              This Visit's Progress   .  Client states, " I want to continue to work on bringing my A1c down and keep my weight down." (pt-stated)   On track     Discussed diabetes self management actions:  Glucose monitoring per provider recommendation  Check feet daily  Visit provider every 3-6 months as directed  Hbg A1C level every 3-6 months.  Eye Exam yearly  Carbohydrate controlled meal planning  Taking diabetes medication as prescribed by provider  Physical activity  Continue daily exercising as tolerated Reviewed nutrition counseling benefit provided by [employer].  RN case manager offered client follow up with Health coach. Client declined at this time. Contact your RN case manager if Health coach services needed in the future.  Monitor weight at least 1-2 times per week.      .  Client understands  the importance of follow-up with providers by attending scheduled visits   On track     Goal renewed 2021 Primary care provider visits completed 09/23/19 Endocrinology visit completed 12/06/19 Continue to follow up with your providers as recommended.     .  Client will report abillity to obtain Medications within 5 months   On track     Goal renewed 2021 Continue to work with your provider in office pharmacist on medication assistance program.  Contact your doctor if you have questions/ concerns regarding your medications Contact your doctor if refills are needed on your medications.      .  COMPLETED: Client will report no worsening of symptoms related to heart disease within the next 5 months        Client denies any symptoms related to heart disease.  Client denies any ED or inpatient hospitalization within the past 5 months related to heart disease.     .  Client will verbalize knowledge of self management of Hypertension as evidences by BP reading of 140/90 or less; or as defined by provider   On track     Goal renewed 2021 Per chart review clients recent in office blood pressure readings: 12/06/19  160/84 10/21/19  160/78 Continue to follow up with your providers in office pharmacist for blood pressure monitoring as advised.  Client advised to take blood pressure monitor to next provider visit for calibration Plan to check you blood pressure at home regularly or as advised by  your doctor. Write results in you [employer] calendar Continue to take your blood pressure medications as prescribed.  Plant to eat a low salt, heart healthy diet: Fruits, vegetables, whole grains, lean protein, and limit fat and sugar.  Continue to exercise as tolerated RN case manager will mail client education article:  High Blood Pressure: What you can do.     .  client will verbalize Rule of 15 treatment for hypoglycemic (low blood sugar) symptoms   On track     Step 1:  Take 15 grams of  carbohydrates when your blood sugar is low, Which includes 3-4 glucose tablets or 3-4 oz of juice or regular soda or One tube of glucose gel Step 2:  Recheck blood sugar in 15 minutes Step 3:  If your blood sugar is still low at the 15 minute recheck then go back to step 1 and treat again with another 15 grams of carbohydrates.    Marland Kitchen  HEMOGLOBIN A1C < 7        Goal renewed 2021:  Hgb A1c completed: 12/03/19 8.0%   09/09/19   8.2% Discussed diabetes self management actions:  Glucose monitoring per provider recommendation  Check feet daily  Visit provider every 3-6 months as directed  Hbg A1C level every 3-6 months.  Eye Exam yearly  Carbohydrate controlled meal planning  Taking diabetes medication as prescribed by provider  Physical activity  Client reports he will continue to use current glucose (blood sugar check) monitoring system.     .  Maintain timely refills of diabetic medication as prescribed within the year .   On track     Goal renewed 2021 Client reports maintaining timely refills of his diabetic medication Continue working with your providers in office pharmacist to secure medication assistance for diabetic insulins  Contact your RN case manager if you have difficulty obtaining your medicaitons Contact your doctor if you have questions.     .  COMPLETED: Obtain annual  Lipid Profile, LDL-C        Most recent lipid profile was completed on 09/09/19     .  Obtain Annual Eye (retinal)  Exam    On track     Goal renewed 2021 Most recent eye exam was completed on 10/12/18 Diabetes can affect your vision.  Plan to schedule your next annual eye exam.      .  Obtain Annual Foot Exam   On track     Goal renewed 2021 Diabetic foot exam was completed on 05/15/19 Your doctor should check your bare feet at each visit. It is important that your doctor check your feet regularly. Diabetes foot care - Check feet daily at home (look for skin color changes, cuts, sores or cracks in  the skin, swelling of feet or ankles, ingrown or fungal toenails, corn or calluses). Report these findings to your doctor - Wash feet with soap and water, dry feet well especially between toes - Moisturize your feet but not between the toes - Always wear shoes that protect your whole feet.       .  COMPLETED: Obtain annual screen for micro albuminuria (urine) , nephropathy (kidney problems)        Most recent urine for microabumin was completed on 09/09/19     .  COMPLETED: Obtain Hemoglobin A1C at least 2 times per year        Most recent Hgb A1C was completed on 12/03/19 and 09/09/19     .  Visit Primary Care Provider or Endocrinologist at least 2 times per year    On track     Goal renewed 2021 Office visit was completed with primary care provider on 09/23/19 Client saw Dr. Dwyane Dee (endocrinologist) on 12/06/19 Client reports next follow up visit with endocrinologist is 01/02/20 Client reports next follow up visit with his primary care provider is February 03, 2020       Plan:  Send successful outreach letter with a copy of their individualized care plan, Send individual care plan to provider and Send educational material  Chronic care management coordinator will outreach in:  6 Months     Quinn Plowman RN,BSN,CCM [facility] [phone]    .

## 2020-01-02 ENCOUNTER — Ambulatory Visit: Payer: HMO | Admitting: Endocrinology

## 2020-01-02 ENCOUNTER — Encounter: Payer: Self-pay | Admitting: Endocrinology

## 2020-01-02 ENCOUNTER — Other Ambulatory Visit: Payer: Self-pay | Admitting: Family Medicine

## 2020-01-02 ENCOUNTER — Other Ambulatory Visit: Payer: Self-pay

## 2020-01-02 VITALS — BP 140/76 | HR 69 | Ht 72.0 in | Wt 219.0 lb

## 2020-01-02 DIAGNOSIS — Z794 Long term (current) use of insulin: Secondary | ICD-10-CM

## 2020-01-02 DIAGNOSIS — E1159 Type 2 diabetes mellitus with other circulatory complications: Secondary | ICD-10-CM

## 2020-01-02 DIAGNOSIS — I10 Essential (primary) hypertension: Secondary | ICD-10-CM | POA: Diagnosis not present

## 2020-01-02 DIAGNOSIS — E1165 Type 2 diabetes mellitus with hyperglycemia: Secondary | ICD-10-CM | POA: Diagnosis not present

## 2020-01-02 NOTE — Patient Instructions (Addendum)
Take sugar at 7-9 pm to help decide dose of Novolog that works for 4 hours  Novolog 10-12 before meals based on meal size

## 2020-01-16 ENCOUNTER — Other Ambulatory Visit: Payer: Self-pay

## 2020-01-16 ENCOUNTER — Ambulatory Visit: Payer: HMO

## 2020-01-16 DIAGNOSIS — I252 Old myocardial infarction: Secondary | ICD-10-CM

## 2020-01-16 DIAGNOSIS — E1169 Type 2 diabetes mellitus with other specified complication: Secondary | ICD-10-CM

## 2020-01-16 DIAGNOSIS — E7800 Pure hypercholesterolemia, unspecified: Secondary | ICD-10-CM

## 2020-01-16 DIAGNOSIS — E1159 Type 2 diabetes mellitus with other circulatory complications: Secondary | ICD-10-CM

## 2020-01-16 DIAGNOSIS — I2510 Atherosclerotic heart disease of native coronary artery without angina pectoris: Secondary | ICD-10-CM

## 2020-01-16 DIAGNOSIS — M1A9XX0 Chronic gout, unspecified, without tophus (tophi): Secondary | ICD-10-CM

## 2020-01-16 NOTE — Patient Instructions (Signed)
Visit Information  Goals Addressed            This Visit's Progress   . Pharmacy Care Plan       CARE PLAN ENTRY  Current Barriers:  . Chronic Disease Management support, education, and care coordination needs related to Hypertension, Hyperlipidemia, Diabetes, and coronary atherosclerosis/ history of MI, gout   Hypertension . Pharmacist Clinical Goal(s): o Over the next 28 days, patient will work with PharmD and providers to achieve BP goal <140/90 . Current regimen:  o amlodipine 10mg , 1 tablet once daily o metoprolol tartrate 100mg , 1 tablet twice daily o ramipril 10mg , 1 capsule once daily  o furosemide 20mg , 1 tablet once daily  o Spironolactone 25mg , 1 tablet once daily . Interventions: o We discussed: DASH diet:  following a diet emphasizing fruits and vegetables and low-fat dairy products along with whole grains, fish, poultry, and nuts. Reducing red meats and sugars.  . Patient self care activities - Over the next 28 days, patient will: o Check BP daily and follow up visit in 4 weeks for reassessment.  o Patient to bring BP monitor at next office visit and verify readings.   Hyperlipidemia/ coronary atherosclerosis/ history of MI . Pharmacist Clinical Goal(s): o Over the next 180 days, patient will work with PharmD and providers to maintain LDL goal < 70 . Current regimen:  o atorvstatin 40mg , 1 tablet once daily  o aspirin 81mg , 1 tablet once daily  . Interventions: o We discussed how a diet high in plant sterols (fruits/vegetables/nuts/whole grains/legumes) may reduce your cholesterol.  . Patient self care activities - Over the next 180 days, patient will: o Continue current medications.   Diabetes . Pharmacist Clinical Goal(s): o Over the next 30 days, patient will work with PharmD and providers to achieve A1c goal  as per Dr. Dwyane Dee  . Current regimen:  o insulin aspart (Novolog Flexpen), inject 10 to 12 units before breakfast if needed and 12 units at  dinner o insulin degludec [name]), inject 40 units at bedtime  o metformin 1000mg , 1 tablet twice daily  . Patient self care activities - Over the next 30 days, patient will: o Check blood sugar twice daily, document, and provide at future appointments o Contact provider with any episodes of hypoglycemia o Patient to bring in denial letter for patient assistance program at next follow up visit.  Gout . Pharmacist Clinical Goal(s) o Over the next 180 days, patient will work with PharmD and providers to  . Current regimen:  o allopurinol 300mg , 1 tablet once daily  o colchicine 0.6mg , 1 tablet once daily as needed . Patient self care activities o Continue current medications.   Medication management . Pharmacist Clinical Goal(s): o Over the next 180 days, patient will work with PharmD and providers to maintain optimal medication adherence . Current pharmacy: [facility]  . Interventions o Comprehensive medication review performed. o Continue current medication management strategy o Look for letter Extra Help program and access approval/ denial.   . Patient self care activities - Over the next 180 days, patient will: o Take medications as prescribed o Report any questions or concerns to PharmD and/or provider(s)  Please see past updates related to this goal by clicking on the "Past Updates" button in the selected goal         Patient verbalizes understanding of instructions provided today.   Telephone follow up appointment with pharmacy team member scheduled for: 02/17/2020  Anson Crofts, PharmD Clinical Pharmacist  [facility] [phone]

## 2020-01-16 NOTE — Chronic Care Management (AMB) (Signed)
Chronic Care Management Pharmacy  Name: Patrick Wilcox  MRN: [id number] DOB: 06-17-1946  Initial Questions: 1. Have you seen any other providers since your last visit? Yes - Dr. Dwyane Wilcox 2. Any changes in your medicines or health? No   Chief Complaint/ HPI  Patrick Wilcox,  74 y.o. , male presents for their Initial CCM visit with the clinical pharmacist via telephone due to COVID-19 Pandemic.  Patient reports overall doing well. Main concern and focus he would like is seeing about patient assistance for insulin.   PCP : [name], [name], MD  Their chronic conditions include: HTN, HLD/ coronary atherosclerosis/ history of MI, DM, Gout  Office Visits:  09/23/2019- Patient presented for office visit with Dr. [name], MD for follow up. For DM, patient is on Novolog 22 units after meals and Tresiba 40 units. Reported BGs: 80s-110s. A1c: 8.2 (09/09/2019). For HTN, BP at office visit: 136/70. Patient on amlodipine 37m, metoprolol tartrate 109mBID, ramipril 10105mBID. DM: since A1c is not at goal, patient to arrange att with endocrinologist. No changes on HTN, HLD, gout regimen. Patient to continue low fat diet and low salt diet. Patient to return in 4 months for CPE.   Consult Visit: 01/02/2020- Endocrinology- Patrick Wilcox- Patient presented for office visit for DM follow up. Patient instructed to check BGs at 7-9pm to decide evening dose of Novolog. Patient to take Novolog 10-12 units before meals based on meal size. Patient to return in 3 months.   12/06/2019- Endocrinology- Patient presented for office visit for follow up. OV BP: 160/84 mmHg. Spironolactone 16m28marted. Patient found to be taking ramipril once daily instead of BID on directions. Patient to continue ramipril once daily. Recheck in 3 weeks as well as repeat renal function.   10/21/2019- Endocrinology- Patient presented for office visit with Dr. AjayElayne Wilcox. BP: 160/78mm24mor uncontrolled type 2 diabetes mellitus. Patient  counseled on nature of mealtime insulin compared to basal insulin. Does not check BG readings; importance was discussed. Patient was reassured metformin does not cause headaches and needs to take the 2 tablets separately. Patient to follow up with PCP on HTN regimen. Consider switching Norvasc to diltiazem for better renal effects.   Medications: Outpatient Encounter Medications as of 01/16/2020  Medication Sig  . Alcohol Swabs (B-D SINGLE USE SWABS REGULAR) PADS Use as necessary  . allopurinol (ZYLOPRIM) 300 MG tablet TAKE 1 TABLET BY MOUTH EVERY DAY  . amLODipine (NORVASC) 10 MG tablet TAKE 1 TABLET BY MOUTH EVERY DAY  . aspirin 81 MG tablet Take 81 mg by mouth daily.  . atoMarland Kitchenvastatin (LIPITOR) 40 MG tablet TAKE 1 TABLET BY MOUTH EVERY DAY  . Blood Glucose Monitoring Suppl (ONETOUCH VERIO IQ SYSTEM) w/Device KIT USE TO CHECK BLOOD SUGAR TWICE A DAY DX CODE E11.9  . colchicine 0.6 MG tablet TAKE 1 TABLET BY MOUTH EVERY DAY  . EASY COMFORT PEN NEEDLES 31G X 5 MM MISC Used to check  Blood glucose 3 times daily or prn.  . furosemide (LASIX) 20 MG tablet TAKE 1 TABLET BY MOUTH EVERY DAY  . insulin aspart (NOVOLOG FLEXPEN) 100 UNIT/ML FlexPen Inject into the skin in the morning and at bedtime. Taking 22 units total per day.  . insulin degludec (TRESIBA FLEXTOUCH) 100 UNIT/ML FlexTouch Pen Inject 40 Units into the skin at bedtime. Inject 40 units under the skin at bedtime.  . metFORMIN (GLUCOPHAGE) 1000 MG tablet TAKE ONE TABLET BY MOUTH TWICE DAILY. TAKE WITH A MEAL.  .Marland Kitchen  metoprolol tartrate (LOPRESSOR) 100 MG tablet TAKE 1 TABLET BY MOUTH TWICE A DAY  . nystatin-triamcinolone ointment (MYCOLOG) Apply topically 2 (two) times daily.  Glory Rosebush DELICA LANCETS 27X MISC Use to check blood sugar twice daily. DX Code E11.9  . ONETOUCH VERIO test strip USE AS INSTRUCTED  . ramipril (ALTACE) 10 MG capsule TAKE 1 CAPSULE (10 MG TOTAL) BY MOUTH 2 (TWO) TIMES DAILY. (Patient taking differently: Take 10 mg by mouth  daily. )  . sildenafil (VIAGRA) 50 MG tablet Take 1 tablet (50 mg total) by mouth daily as needed for erectile dysfunction.  Marland Kitchen spironolactone (ALDACTONE) 25 MG tablet Take 1 tablet (25 mg total) by mouth daily.   No facility-administered encounter medications on file as of 01/16/2020.     Current Diagnosis/Assessment:  Goals Addressed            This Visit's Progress   . Pharmacy Care Plan       CARE PLAN ENTRY  Current Barriers:  . Chronic Disease Management support, education, and care coordination needs related to Hypertension, Hyperlipidemia, Diabetes, and coronary atherosclerosis/ history of MI, gout   Hypertension . Pharmacist Clinical Goal(s): o Over the next 28 days, patient will work with PharmD and providers to achieve BP goal <140/90 . Current regimen:  o amlodipine 46m, 1 tablet once daily o metoprolol tartrate 1052m 1 tablet twice daily o ramipril 1071m1 capsule once daily  o furosemide 60m32m tablet once daily  o Spironolactone 25mg19mtablet once daily . Interventions: o We discussed: DASH diet:  following a diet emphasizing fruits and vegetables and low-fat dairy products along with whole grains, fish, poultry, and nuts. Reducing red meats and sugars.  . Patient self care activities - Over the next 28 days, patient will: o Check BP daily and follow up visit in 4 weeks for reassessment.  o Patient to bring BP monitor at next office visit and verify readings.   Hyperlipidemia/ coronary atherosclerosis/ history of MI . Pharmacist Clinical Goal(s): o Over the next 180 days, patient will work with PharmD and providers to maintain LDL goal < 70 . Current regimen:  o atorvstatin 40mg,1mablet once daily  o aspirin 81mg, 8mblet once daily  . Interventions: o We discussed how a diet high in plant sterols (fruits/vegetables/nuts/whole grains/legumes) may reduce your cholesterol.  . Patient self care activities - Over the next 180 days, patient will: o Continue  current medications.   Diabetes . Pharmacist Clinical Goal(s): o Over the next 30 days, patient will work with PharmD and providers to achieve A1c goal  as per Dr. Kumar  Patrick Deerent regimen:  o insulin aspart (Novolog Flexpen), inject 10 to 12 units before breakfast if needed and 12 units at dinner o insulin degludec (TresibTyler Aasuch), inject 40 units at bedtime  o metformin 1000mg, 135mlet twice daily  . Patient self care activities - Over the next 30 days, patient will: o Check blood sugar twice daily, document, and provide at future appointments o Contact provider with any episodes of hypoglycemia o Patient to bring in denial letter for patient assistance program at next follow up visit.  Gout . Pharmacist Clinical Goal(s) o Over the next 180 days, patient will work with PharmD and providers to  . Current regimen:  o allopurinol 300mg, 1 30met once daily  o colchicine 0.6mg, 1 ta81mt once daily as needed . Patient self care activities o Continue current medications.   Medication management . Pharmacist Clinical  Goal(s): o Over the next 180 days, patient will work with PharmD and providers to maintain optimal medication adherence . [facility]  . Interventions o Comprehensive medication review performed. o Continue current medication management strategy o Look for letter Extra Help program and access approval/ denial.   . Patient self care activities - Over the next 180 days, patient will: o Take medications as prescribed o Report any questions or concerns to PharmD and/or provider(s)  Please see past updates related to this goal by clicking on the "Past Updates" button in the selected goal        SDOH Interventions     Most Recent Value  SDOH Interventions  Financial Strain Interventions Other (Comment)  [Patient reports being denies by a patient assistance program. Does not know which one. Patient to bring in letter for follow up.]      SDOH  Interventions     Most Recent Value  SDOH Interventions  Financial Strain Interventions Other (Comment)  [Patient reports being denies by a patient assistance program. Does not know which one. Patient to bring in letter for follow up.]       Diabetes  Patient reported being diagnosed in 2003.  Managed by Dr. Dwyane Wilcox.  Patient noted improvement since last visit with Dr. Dwyane Wilcox.   Patient's biggest concern was insulin cost. Patient assistance forms were faxed by Dr. Ronnie Derby office on 12/12/19.   Patient did not know outcome on Extra Help program application.   Update 01/16/20:  Patient stated he was not approved due to income. States he obtained a letter in the mail. Patient is not sure whic program he was denied.   Recent Relevant Labs: Lab Results  Component Value Date/Time   HGBA1C 8.0 (H) 12/03/2019 09:36 AM   HGBA1C 8.2 (H) 09/09/2019 09:47 AM   MICROALBUR 148.4 (H) 09/09/2019 09:47 AM   MICROALBUR 6.8 (H) 03/16/2018 09:31 AM    Checking BG: 2x per Day  Recent FBG Readings: 111 Recent 2hr PP BG readings:  98, 78  Patient is currently uncontrolled (based on blood sugars) on the following medications:   insulin aspart (Novolog Flexpen), inject 10-12 units at breakfast if needed and 12 units at suppertime   insulin degludec [name]), inject 40 units at suppertime   metformin 1077m, 1 tablet twice daily   Last diabetic Eye exam:  Lab Results  Component Value Date/Time   HMDIABEYEEXA No Retinopathy 10/09/2017 12:00 AM  - patient reports obtaining yearly eye exam.    Last diabetic Foot exam:  Lab Results  Component Value Date/Time   HMDIABFOOTEX done 08/22/2011 12:00 AM    We discussed: how to recognize and treat signs of hypoglycemia . Preventative diabetic health exams . Maintain a low carbohydrate diet, eating 45 grams of carbohydrates per meal (3 servings of carbohydrates per meal), 15 grams of carbohydrates per snack (1 serving of carbohydrate per snack).    Plan Managed by endo (Dr. KDwyane Wilcox.  Follow up on patient assistance program at next follow up visit. Patient to bring letter of denial to assess which program he was denied.  Continue current medications   Hypertension  Patient reports he was able to pick up spironolactone and start it. He reports not getting good BP readings. Plans to bring by at next visit. Does not want to record any numbers since he is not sure they are accurate.   Denies dizziness/ lightheadedness. Denies HA/ Vision changes.   Office blood pressures are  BP Readings from Last  3 Encounters:  01/02/20 140/76  12/06/19 (!) 160/84  10/21/19 (!) 160/78   Patient checks BP at home currently not checking; will be coordinating with a friend to help him.   Patient home BP readings are ranging: currently not checking  Patient is better controlled (based on last visit BP) on:  - amlodipine 75m, 1 tablet once daily - metoprolol tartrate 1038m 1 tablet twice daily - ramipril 1051m1 capsule once daily  - furosemide 83m11m tablet once daily  - sprironolactone  25mg85mtablet once daily    Plan Patient to continue checking BP readings at home and bring monitor at next visit with Dr. JordaMartiniquer this month. Will follow up 2 weeks after visit with Dr. JordaMartiniqueeassess.  Continue current medications   Hyperlipidemia/ Coronary atherosclerosis/ Hx of MI   Lipid Panel     Component Value Date/Time   CHOL 125 09/09/2019 0947   TRIG 114.0 09/09/2019 0947   TRIG 59 05/30/2006 0949   HDL 31.40 (L) 09/09/2019 0947   CHOLHDL 4 09/09/2019 0947   VLDL 22.8 09/09/2019 0947   LDLCALC 71 09/09/2019 0947    The ASCVD Risk score (Goff DC Jr., et al., 2013) failed to calculate for the following reasons:   The patient has a prior MI or stroke diagnosis   Patient is currently controlled on the following medications:  - atorvstatin 40mg,34mablet once daily (high intensity) - aspirin 81mg, 81mblet once daily   We  discussed:  diet and exercise extensively  . How to reduce cholesterol through diet/weight management and physical activity.    . We discussed how a diet high in plant sterols (fruits/vegetables/nuts/whole grains/legumes) may reduce your cholesterol.   Plan Continue current medications  Gout   Patient reported last flare-up being over a year ago.   Patient is currently controlled on the following medications:  - allopurinol 300mg, 124mlet once daily  - colchicine 0.6mg, 1 t48met once daily as needed  Uric acid: 4.4 (09/09/2019)   Plan Continue current medications  Medication Management  Patient organizes medications: patient reports having his own management system. He reports having empty bottles for the week and would organize his medications for the week (similar to pill box, but with pill bottles).  [facility] Adherence:  - ramipril (last filled 10/09/19 for 90DS): patient reports taking once daily (recommend sending updated RX with directions of once daily if that's the way patient needs to continue  - metoprolol 100mg (las18mlled 10/09/19 for 90DS): patient reports he tends to forget evening doses of medications.   Follow up Follow up visit in 4 weeks for BP reassessment and patient assistance follow up.     Annette DeAnson CroftsClinical Pharmacist [phone]

## 2020-02-03 ENCOUNTER — Ambulatory Visit: Payer: HMO | Admitting: Family Medicine

## 2020-02-03 DIAGNOSIS — Z0289 Encounter for other administrative examinations: Secondary | ICD-10-CM

## 2020-02-04 ENCOUNTER — Other Ambulatory Visit: Payer: Self-pay

## 2020-02-07 ENCOUNTER — Other Ambulatory Visit: Payer: Self-pay | Admitting: Family Medicine

## 2020-02-17 ENCOUNTER — Other Ambulatory Visit: Payer: Self-pay

## 2020-02-17 ENCOUNTER — Telehealth: Payer: Self-pay

## 2020-02-17 ENCOUNTER — Ambulatory Visit: Payer: HMO

## 2020-02-17 DIAGNOSIS — I152 Hypertension secondary to endocrine disorders: Secondary | ICD-10-CM

## 2020-02-17 DIAGNOSIS — I2510 Atherosclerotic heart disease of native coronary artery without angina pectoris: Secondary | ICD-10-CM

## 2020-02-17 DIAGNOSIS — Z794 Long term (current) use of insulin: Secondary | ICD-10-CM

## 2020-02-17 DIAGNOSIS — M1A9XX0 Chronic gout, unspecified, without tophus (tophi): Secondary | ICD-10-CM

## 2020-02-17 DIAGNOSIS — E1159 Type 2 diabetes mellitus with other circulatory complications: Secondary | ICD-10-CM

## 2020-02-17 DIAGNOSIS — I252 Old myocardial infarction: Secondary | ICD-10-CM

## 2020-02-17 DIAGNOSIS — E7800 Pure hypercholesterolemia, unspecified: Secondary | ICD-10-CM

## 2020-02-17 DIAGNOSIS — E1169 Type 2 diabetes mellitus with other specified complication: Secondary | ICD-10-CM

## 2020-02-17 MED ORDER — SILDENAFIL CITRATE 50 MG PO TABS: 50.0000 mg | ORAL_TABLET | Freq: Every day | ORAL | 0 refills | Status: DC | PRN

## 2020-02-17 NOTE — Patient Instructions (Addendum)
Visit Information  Goals Addressed            This Visit's Progress   . Pharmacy Care Plan       CARE PLAN ENTRY  Current Barriers:  . Chronic Disease Management support, education, and care coordination needs related to Hypertension, Hyperlipidemia, Diabetes, and coronary atherosclerosis/ history of MI, gout   Hypertension . Pharmacist Clinical Goal(s): o Over the next 30 days, patient will work with PharmD and providers to achieve BP goal <140/90 . Current regimen:  o amlodipine 10mg , 1 tablet once daily o metoprolol tartrate 100mg , 1 tablet twice daily o ramipril 10mg , 1 capsule once daily  o furosemide 20mg , 1 tablet once daily  o Spironolactone 25mg , 1 tablet once daily . Interventions: o We discussed: DASH diet:  following a diet emphasizing fruits and vegetables and low-fat dairy products along with whole grains, fish, poultry, and nuts. Reducing red meats and sugars.  . Patient self care activities - Over the next 30 days, patient will: o Check BP daily and follow up visit in 4 weeks for reassessment.  o Patient to bring BP monitor at next office visit and verify readings.   Hyperlipidemia/ coronary atherosclerosis/ history of MI Lab Results  Component Value Date/Time   LDLCALC 71 09/09/2019 09:47 AM .  Pharmacist Clinical Goal(s): o Over the next 180 days, patient will work with PharmD and providers to maintain LDL goal < 70 . Current regimen:  o atorvstatin 40mg , 1 tablet once daily  o aspirin 81mg , 1 tablet once daily  . Interventions: o We discussed how a diet high in plant sterols (fruits/vegetables/nuts/whole grains/legumes) may reduce your cholesterol.  . Patient self care activities - Over the next 180 days, patient will: o Continue current medications.   Diabetes Hgb A1c MFr Bld  Date Value Ref Range Status  12/03/2019 8.0 (H) 4.6 - 6.5 % Final    Comment:    Glycemic Control Guidelines for People with Diabetes:Non Diabetic:  <6%Goal of Therapy:  <7%Additional Action Suggested:  >8%  .  Pharmacist Clinical Goal(s): o Over the next 30 days, patient will work with PharmD and providers to achieve A1c goal  as per Dr. Dwyane Dee  . Current regimen:  o insulin aspart (Novolog Flexpen), inject 10 to 12 units before breakfast if needed and 12 units at dinner o insulin degludec [name]), inject 40 units at bedtime  o metformin 1000mg , 1 tablet twice daily  . Patient self care activities - Over the next 30 days, patient will: o Check blood sugar twice daily, document, and provide at future appointments o Contact provider with any episodes of hypoglycemia o  Gout . Pharmacist Clinical Goal(s) o Over the next 180 days, patient will work with PharmD and providers to prevent gout flare-ups.  . Current regimen:  o allopurinol 300mg , 1 tablet once daily  o colchicine 0.6mg , 1 tablet once daily as needed . Patient self care activities o Continue current medications.   Medication management . Pharmacist Clinical Goal(s): o Over the next 180 days, patient will work with PharmD and providers to achieve optimal medication adherence . [facility]  . Interventions o Comprehensive medication review performed. o Continue current medication management strategy o Consider [facility] pharmacy for medication synchronization, packaging and delivery . Patient self care activities - Over the next 180 days, patient will: o Take medications as prescribed o Report any questions or concerns to PharmD and/or provider(s)  Please see past updates related to this goal by clicking  on the "Past Updates" button in the selected goal         Patient verbalizes understanding of instructions provided today.   Telephone follow up appointment with pharmacy team member scheduled for:  05/06/2020  Anson Crofts, PharmD Clinical Pharmacist [facility] [place] [phone]    Managing Your Hypertension Hypertension is  commonly called high blood pressure. This is when the force of your blood pressing against the walls of your arteries is too strong. Arteries are blood vessels that carry blood from your heart throughout your body. Hypertension forces the heart to work harder to pump blood, and may cause the arteries to become narrow or stiff. Having untreated or uncontrolled hypertension can cause heart attack, stroke, kidney disease, and other problems. What are blood pressure readings? A blood pressure reading consists of a higher number over a lower number. Ideally, your blood pressure should be below 120/80. The first ("top") number is called the systolic pressure. It is a measure of the pressure in your arteries as your heart beats. The second ("bottom") number is called the diastolic pressure. It is a measure of the pressure in your arteries as the heart relaxes. What does my blood pressure reading mean? Blood pressure is classified into four stages. Based on your blood pressure reading, your health care provider may use the following stages to determine what type of treatment you need, if any. Systolic pressure and diastolic pressure are measured in a unit called mm Hg. Normal  Systolic pressure: below 242.  Diastolic pressure: below 80. Elevated  Systolic pressure: 683-419.  Diastolic pressure: below 80. Hypertension stage 1  Systolic pressure: 622-297.  Diastolic pressure: 98-92. Hypertension stage 2  Systolic pressure: 119 or above.  Diastolic pressure: 90 or above. What health risks are associated with hypertension? Managing your hypertension is an important responsibility. Uncontrolled hypertension can lead to:  A heart attack.  A stroke.  A weakened blood vessel (aneurysm).  Heart failure.  Kidney damage.  Eye damage.  Metabolic syndrome.  Memory and concentration problems. What changes can I make to manage my hypertension? Hypertension can be managed by making lifestyle  changes and possibly by taking medicines. Your health care provider will help you make a plan to bring your blood pressure within a normal range. Eating and drinking   Eat a diet that is high in fiber and potassium, and low in salt (sodium), added sugar, and fat. An example eating plan is called the DASH (Dietary Approaches to Stop Hypertension) diet. To eat this way: ? Eat plenty of fresh fruits and vegetables. Try to fill half of your plate at each meal with fruits and vegetables. ? Eat whole grains, such as whole wheat pasta, brown rice, or whole grain bread. Fill about one quarter of your plate with whole grains. ? Eat low-fat diary products. ? Avoid fatty cuts of meat, processed or cured meats, and poultry with skin. Fill about one quarter of your plate with lean proteins such as fish, chicken without skin, beans, eggs, and tofu. ? Avoid premade and processed foods. These tend to be higher in sodium, added sugar, and fat.  Reduce your daily sodium intake. Most people with hypertension should eat less than 1,500 mg of sodium a day.  Limit alcohol intake to no more than 1 drink a day for nonpregnant women and 2 drinks a day for men. One drink equals 12 oz of beer, 5 oz of wine, or 1 oz of hard liquor. Lifestyle  Work with your health care provider to maintain a healthy body weight, or to lose weight. Ask what an ideal weight is for you.  Get at least 30 minutes of exercise that causes your heart to beat faster (aerobic exercise) most days of the week. Activities may include walking, swimming, or biking.  Include exercise to strengthen your muscles (resistance exercise), such as weight lifting, as part of your weekly exercise routine. Try to do these types of exercises for 30 minutes at least 3 days a week.  Do not use any products that contain nicotine or tobacco, such as cigarettes and e-cigarettes. If you need help quitting, ask your health care provider.  Control any long-term  (chronic) conditions you have, such as high cholesterol or diabetes. Monitoring  Monitor your blood pressure at home as told by your health care provider. Your personal target blood pressure may vary depending on your medical conditions, your age, and other factors.  Have your blood pressure checked regularly, as often as told by your health care provider. Working with your health care provider  Review all the medicines you take with your health care provider because there may be side effects or interactions.  Talk with your health care provider about your diet, exercise habits, and other lifestyle factors that may be contributing to hypertension.  Visit your health care provider regularly. Your health care provider can help you create and adjust your plan for managing hypertension. Will I need medicine to control my blood pressure? Your health care provider may prescribe medicine if lifestyle changes are not enough to get your blood pressure under control, and if:  Your systolic blood pressure is 130 or higher.  Your diastolic blood pressure is 80 or higher. Take medicines only as told by your health care provider. Follow the directions carefully. Blood pressure medicines must be taken as prescribed. The medicine does not work as well when you skip doses. Skipping doses also puts you at risk for problems. Contact a health care provider if:  You think you are having a reaction to medicines you have taken.  You have repeated (recurrent) headaches.  You feel dizzy.  You have swelling in your ankles.  You have trouble with your vision. Get help right away if:  You develop a severe headache or confusion.  You have unusual weakness or numbness, or you feel faint.  You have severe pain in your chest or abdomen.  You vomit repeatedly.  You have trouble breathing. Summary  Hypertension is when the force of blood pumping through your arteries is too strong. If this condition is not  controlled, it may put you at risk for serious complications.  Your personal target blood pressure may vary depending on your medical conditions, your age, and other factors. For most people, a normal blood pressure is less than 120/80.  Hypertension is managed by lifestyle changes, medicines, or both. Lifestyle changes include weight loss, eating a healthy, low-sodium diet, exercising more, and limiting alcohol. This information is not intended to replace advice given to you by your health care provider. Make sure you discuss any questions you have with your health care provider. Document Revised: 10/19/2018 Document Reviewed: 05/25/2016 Elsevier Patient Education  [place].

## 2020-02-17 NOTE — Telephone Encounter (Deleted)
Okay to refill? 

## 2020-02-17 NOTE — Chronic Care Management (AMB) (Signed)
Chronic Care Management Pharmacy  Name: Patrick Wilcox  MRN: [id number] DOB: 06/02/1946  Initial Questions: 1. Have you seen any other providers since your last visit? No  2. Any changes in your medicines or health? No   Chief Complaint/ HPI  Patrick Wilcox,  74 y.o. , male presents for their Follow-Up CCM visit with the clinical pharmacist via telephone due to COVID-19 Pandemic.  Update 02/17/2020:  Patient reported his blood sugars are doing well. He states he has not heard back from patient assistance programs, but states currently is not paying anything for both of this insulins.  Patient requested prescription for s[facility]il [facility] day supply.   Patient inquired if he needs to continue spironolactone. He notes he has not been checking blood pressure at home since he "feels blood pressure monitor is not accurate".     Update 01/16/2020:  Patient stated he was not approved due to income. States he obtained a letter in the mail. Patient is not sure whic program he was denied.     Update: 12/16/2019 Patient reports overall doing well. Main concern and focus he would like is seeing about patient assistance for insulin.  Patient's biggest concern was insulin cost. Patient assistance forms were faxed by Dr. Ronnie Derby office on 12/12/19.   Patient did not know outcome on Extra Help program application.   Patient reports he was able to pick up spironolactone and start it. He reports not getting good BP readings. Plans to bring by at next visit. Does not want to record any numbers since he is not sure they are accurate.   PCP : [name], [name], MD  Their chronic conditions include: HTN, HLD/ coronary atherosclerosis/ history of MI, DM, Gout  Office Visits:  09/23/2019- Patient presented for office visit with Dr. [name], MD for follow up. For DM, patient is on Novolog 22 units after meals and Tresiba 40 units. Reported BGs: 80s-110s. A1c: 8.2 (09/09/2019). For  HTN, BP at office visit: 136/70. Patient on amlodipine 70m, metoprolol tartrate 107mBID, ramipril 1018mBID. DM: since A1c is not at goal, patient to arrange att with endocrinologist. No changes on HTN, HLD, gout regimen. Patient to continue low fat diet and low salt diet. Patient to return in 4 months for CPE.   Consult Visit: 01/02/2020- Endocrinology- AjaElayne SnareD- Patient presented for office visit for DM follow up. Patient instructed to check BGs at 7-9pm to decide evening dose of Novolog. Patient to take Novolog 10-12 units before meals based on meal size. Patient to return in 3 months.   12/06/2019- Endocrinology- Patient presented for office visit for follow up. OV BP: 160/84 mmHg. Spironolactone 2m59marted. Patient found to be taking ramipril once daily instead of BID on directions. Patient to continue ramipril once daily. Recheck in 3 weeks as well as repeat renal function.   10/21/2019- Endocrinology- Patient presented for office visit with Dr. AjayElayne Snare. BP: 160/78mm66mor uncontrolled type 2 diabetes mellitus. Patient counseled on nature of mealtime insulin compared to basal insulin. Does not check BG readings; importance was discussed. Patient was reassured metformin does not cause headaches and needs to take the 2 tablets separately. Patient to follow up with PCP on HTN regimen. Consider switching Norvasc to diltiazem for better renal effects.   Medications: Outpatient Encounter Medications as of 02/17/2020  Medication Sig  . allopurinol (ZYLOPRIM) 300 MG tablet TAKE 1 TABLET BY MOUTH EVERY DAY  . amLODipine (NORVASC) 10 MG  tablet TAKE 1 TABLET BY MOUTH EVERY DAY  . aspirin 81 MG tablet Take 81 mg by mouth daily.  Marland Kitchen atorvastatin (LIPITOR) 40 MG tablet TAKE 1 TABLET BY MOUTH EVERY DAY  . furosemide (LASIX) 20 MG tablet TAKE 1 TABLET BY MOUTH EVERY DAY  . insulin aspart (NOVOLOG FLEXPEN) 100 UNIT/ML FlexPen Inject into the skin in the morning and at bedtime. Taking 22 units total  per day.  . insulin degludec (TRESIBA FLEXTOUCH) 100 UNIT/ML FlexTouch Pen Inject 40 Units into the skin at bedtime. Inject 40 units under the skin at bedtime.  . metFORMIN (GLUCOPHAGE) 1000 MG tablet TAKE ONE TABLET BY MOUTH TWICE DAILY. TAKE WITH A MEAL.  . metoprolol tartrate (LOPRESSOR) 100 MG tablet TAKE 1 TABLET BY MOUTH TWICE A DAY  . ramipril (ALTACE) 10 MG capsule TAKE 1 CAPSULE (10 MG TOTAL) BY MOUTH 2 (TWO) TIMES DAILY. (Patient taking differently: Take 10 mg by mouth daily. )  . sildenafil (VIAGRA) 50 MG tablet Take 1 tablet (50 mg total) by mouth daily as needed for erectile dysfunction.  Marland Kitchen spironolactone (ALDACTONE) 25 MG tablet Take 1 tablet (25 mg total) by mouth daily.  . Alcohol Swabs (B-D SINGLE USE SWABS REGULAR) PADS Use as necessary  . Blood Glucose Monitoring Suppl (ONETOUCH VERIO IQ SYSTEM) w/Device KIT USE TO CHECK BLOOD SUGAR TWICE A DAY DX CODE E11.9  . colchicine 0.6 MG tablet TAKE 1 TABLET BY MOUTH EVERY DAY  . EASY COMFORT PEN NEEDLES 31G X 5 MM MISC Used to check  Blood glucose 3 times daily or prn.  . nystatin-triamcinolone ointment (MYCOLOG) Apply topically 2 (two) times daily.  Glory Rosebush DELICA LANCETS 78E MISC Use to check blood sugar twice daily. DX Code E11.9  . ONETOUCH VERIO test strip USE AS INSTRUCTED   No facility-administered encounter medications on file as of 02/17/2020.     Current Diagnosis/Assessment:  Goals Addressed            This Visit's Progress   . Pharmacy Care Plan       CARE PLAN ENTRY  Current Barriers:  . Chronic Disease Management support, education, and care coordination needs related to Hypertension, Hyperlipidemia, Diabetes, and coronary atherosclerosis/ history of MI, gout   Hypertension . Pharmacist Clinical Goal(s): o Over the next 30 days, patient will work with PharmD and providers to achieve BP goal <140/90 . Current regimen:  o amlodipine 72m, 1 tablet once daily o metoprolol tartrate 1054m 1 tablet twice  daily o ramipril 1061m1 capsule once daily  o furosemide 39m57m tablet once daily  o Spironolactone 25mg28mtablet once daily . Interventions: o We discussed: DASH diet:  following a diet emphasizing fruits and vegetables and low-fat dairy products along with whole grains, fish, poultry, and nuts. Reducing red meats and sugars.  . Patient self care activities - Over the next 30 days, patient will: o Check BP daily and follow up visit in 4 weeks for reassessment.  o Patient to bring BP monitor at next office visit and verify readings.   Hyperlipidemia/ coronary atherosclerosis/ history of MI Lab Results  Component Value Date/Time   LDLCALC 71 09/09/2019 09:47 AM .  Pharmacist Clinical Goal(s): o Over the next 180 days, patient will work with PharmD and providers to maintain LDL goal < 70 . Current regimen:  o atorvstatin 40mg,59mablet once daily  o aspirin 81mg, 61mblet once daily  . Interventions: o We discussed how a diet high in plant  sterols (fruits/vegetables/nuts/whole grains/legumes) may reduce your cholesterol.  . Patient self care activities - Over the next 180 days, patient will: o Continue current medications.   Diabetes Hgb A1c MFr Bld  Date Value Ref Range Status  12/03/2019 8.0 (H) 4.6 - 6.5 % Final    Comment:    Glycemic Control Guidelines for People with Diabetes:Non Diabetic:  <6%Goal of Therapy: <7%Additional Action Suggested:  >8%  .  Pharmacist Clinical Goal(s): o Over the next 30 days, patient will work with PharmD and providers to achieve A1c goal  as per Dr. Dwyane Dee  . Current regimen:  o insulin aspart (Novolog Flexpen), inject 10 to 12 units before breakfast if needed and 12 units at dinner o insulin degludec [name]), inject 40 units at bedtime  o metformin 1038m, 1 tablet twice daily  . Patient self care activities - Over the next 30 days, patient will: o Check blood sugar twice daily, document, and provide at future  appointments o Contact provider with any episodes of hypoglycemia o  Gout . Pharmacist Clinical Goal(s) o Over the next 180 days, patient will work with PharmD and providers to prevent gout flare-ups.  . Current regimen:  o allopurinol 30538m 1 tablet once daily  o colchicine 0.38m48m1 tablet once daily as needed . Patient self care activities o Continue current medications.   Medication management . Pharmacist Clinical Goal(s): o Over the next 180 days, patient will work with PharmD and providers to achieve optimal medication adherence . [facility]  . Interventions o Comprehensive medication review performed. o Continue current medication management strategy o Consider [facility] pharmacy for medication synchronization, packaging and delivery . Patient self care activities - Over the next 180 days, patient will: o Take medications as prescribed o Report any questions or concerns to PharmD and/or provider(s)  Please see past updates related to this goal by clicking on the "Past Updates" button in the selected goal        SDOH Interventions     Most Recent Value  SDOH Interventions  Financial Strain Interventions Intervention Not Indicated  [Patient currently not being charged for both insulin products.]       Diabetes  Patient reported being diagnosed in 2003.  Managed by Dr. KumDwyane Dee Recent Relevant Labs: Lab Results  Component Value Date/Time   HGBA1C 8.0 (H) 12/03/2019 09:36 AM   HGBA1C 8.2 (H) 09/09/2019 09:47 AM   MICROALBUR 148.4 (H) 09/09/2019 09:47 AM   MICROALBUR 6.8 (H) 03/16/2018 09:31 AM    Checking BG: 2x per Day  Recent FBG Readings:unable to provider current readings.   Previous FBS readings: 111  Recent 2hr PP BG readings: unable to provide current readings.  Previous 2 hr PP BG readings: 98, 78  Patient is currently uncontrolled (based on blood sugars) on the following medications:   insulin aspart (Novolog Flexpen), inject 10-12  units at breakfast if needed and 12 units at suppertime   insulin degludec (TrTyler Aasextouch), inject 40 units at suppertime    metformin 1000m67m tablet twice daily   Last diabetic Eye exam:  Lab Results  Component Value Date/Time   HMDIABEYEEXA No Retinopathy 10/09/2017 12:00 AM  - patient reports obtaining yearly eye exam.    Last diabetic Foot exam:  Lab Results  Component Value Date/Time   HMDIABFOOTEX done 08/22/2011 12:00 AM     We discussed: how to recognize and treat signs of hypoglycemia (denies symptoms)  Plan Managed by endo (Dr. KumaDwyane Deeatient has  follow up visit in September. Patient to obtain repeat A1c then.  Continue current medications   Hypertension   Denies dizziness/ lightheadedness. Denies HA/ Vision changes.   Office blood pressures are:  BP Readings from Last 3 Encounters:  01/02/20 140/76  12/06/19 (!) 160/84  10/21/19 (!) 160/78   Patient checks BP at home currently not checking; will be coordinating with a friend/ pharmacy to help him.   Patient home BP readings are ranging: currently not checking  Patient is better controlled (based on last visit BP) on:   amlodipine 23m, 1 tablet once daily  metoprolol tartrate 1065m 1 tablet twice daily  ramipril 1044m1 capsule once daily   furosemide 77m23m tablet once daily   sprironolactone  25mg16mtablet once daily   We discussed:  - importance of adherence of blood pressure medications and avoid discontinuing medications without consulting with providers first.   Plan Patient to continue trying to check BP at home and bring monitor at next visit with Dr. KumarDwyane DeeJordaMartiniquer this month. Continue current medications   Hyperlipidemia/ Coronary atherosclerosis/ Hx of MI   Lipid Panel     Component Value Date/Time   CHOL 125 09/09/2019 0947   TRIG 114.0 09/09/2019 0947   TRIG 59 05/30/2006 0949   HDL 31.40 (L) 09/09/2019 0947   CHOLHDL 4 09/09/2019 0947   VLDL 22.8 09/09/2019  0947   LDLCALC 71 09/09/2019 0947    The ASCVD Risk score (Goff DC Jr., et al., 2013) failed to calculate for the following reasons:   The patient has a prior MI or stroke diagnosis   Patient is currently controlled on the following medications:  - atorvstatin 40mg,7mablet once daily (high intensity) - aspirin 81mg, 44mblet once daily   We discussed:  diet and exercise extensively   . We discussed how a diet high in plant sterols (fruits/vegetables/nuts/whole grains/legumes) may reduce your cholesterol.   Plan Continue current medications  Gout   Patient reported last flare-up being over a year ago.   Patient is currently controlled on the following medications:   allopurinol 300mg, 136mlet once daily   colchicine 0.6mg, 1 t49met once daily as needed  Uric acid: 4.4 (09/09/2019)   Plan Continue current medications  Medication Management  Patient organizes medications: patient reports having his own management system. He reports having empty bottles for the week and would organize his medications for the week (similar to pill box, but with pill bottles).  [facility] Adherence:  - ramipril (last filled 10/09/19 for 90DS): patient reports taking once daily (recommend sending updated RX with directions of once daily if that's the way patient needs to continue  - metoprolol 100mg (las77mlled 10/09/19 for 90DS): patient reports he tends to forget evening doses of medications - metformin 1000mg (last6mled 11/11/19 for 90DS)    Plan  Consider switch of metoprolol tartrate to metoprolol succinate to improve medication adherence.   Recommend sending updated prescription of ramipril ofr 10mg once d73m to reflect how patient is now taking        Follow up Follow up visit in 8 weeks for BP reassessment.  Requested [facility])      Rewa Weissberg De SAnson CroftsACP  Clinical Pharmacist [place] [facility] ([phone]

## 2020-02-17 NOTE — Telephone Encounter (Signed)
Rx sent in as requested. 

## 2020-02-17 NOTE — Telephone Encounter (Signed)
-----   Message from [place], [facility] sent at 02/17/2020  5:15 PM EDT ----- Regarding: Sildenafil refill Hi Patrick Wilcox,   I had CCM visit with patient. Patient requested refill for sildenafil sent to [facility] in order to use coupon. [facility], but patient did not pick since he could not use coupon.   Can you send the refill for sildenafil to [facility]? His coupon is for 90 day supply...  Thanks! Anne Ng

## 2020-02-24 ENCOUNTER — Other Ambulatory Visit: Payer: Self-pay | Admitting: Family Medicine

## 2020-03-08 ENCOUNTER — Other Ambulatory Visit: Payer: Self-pay | Admitting: Endocrinology

## 2020-03-12 ENCOUNTER — Other Ambulatory Visit: Payer: Self-pay | Admitting: Family Medicine

## 2020-04-01 ENCOUNTER — Telehealth: Payer: Self-pay | Admitting: Family Medicine

## 2020-04-01 DIAGNOSIS — Z1211 Encounter for screening for malignant neoplasm of colon: Secondary | ICD-10-CM

## 2020-04-01 NOTE — Telephone Encounter (Signed)
Okay to place referral for colonoscopy?

## 2020-04-01 NOTE — Telephone Encounter (Signed)
Pt is calling to have his colonoscopy scheduled  Please call him at [phone]

## 2020-04-02 NOTE — Telephone Encounter (Signed)
It seems like he is due,so it is ok to place referral. Thanks, BJ

## 2020-04-03 NOTE — Addendum Note (Signed)
Addended by: Rodrigo Ran on: 04/03/2020 09:51 AM   Modules accepted: Orders

## 2020-04-03 NOTE — Telephone Encounter (Signed)
I called and spoke with patient. He is aware referral for colonoscopy has been placed & they will call him to set it up. Pt wanted a follow up with Dr. [name] scheduled. Appt scheduled for 9/29 at 4:30.

## 2020-04-05 ENCOUNTER — Other Ambulatory Visit: Payer: Self-pay | Admitting: Family Medicine

## 2020-04-05 DIAGNOSIS — E1169 Type 2 diabetes mellitus with other specified complication: Secondary | ICD-10-CM

## 2020-04-06 ENCOUNTER — Other Ambulatory Visit (INDEPENDENT_AMBULATORY_CARE_PROVIDER_SITE_OTHER): Payer: HMO

## 2020-04-06 ENCOUNTER — Other Ambulatory Visit: Payer: Self-pay

## 2020-04-06 DIAGNOSIS — E1165 Type 2 diabetes mellitus with hyperglycemia: Secondary | ICD-10-CM | POA: Diagnosis not present

## 2020-04-06 DIAGNOSIS — Z794 Long term (current) use of insulin: Secondary | ICD-10-CM | POA: Diagnosis not present

## 2020-04-06 LAB — BASIC METABOLIC PANEL
BUN: 10 mg/dL (ref 6–23)
CO2: 26 mEq/L (ref 19–32)
Calcium: 9.3 mg/dL (ref 8.4–10.5)
Chloride: 101 mEq/L (ref 96–112)
Creatinine, Ser: 0.8 mg/dL (ref 0.40–1.50)
GFR: 114.19 mL/min (ref >60.00)
Glucose, Bld: 267 mg/dL — ABNORMAL HIGH (ref 70–99)
Potassium: 4.1 mEq/L (ref 3.5–5.1)
Sodium: 136 mEq/L (ref 135–145)

## 2020-04-06 LAB — HEMOGLOBIN A1C: Hgb A1c MFr Bld: 9.4 % — ABNORMAL HIGH (ref 4.6–6.5)

## 2020-04-08 ENCOUNTER — Other Ambulatory Visit: Payer: Self-pay

## 2020-04-08 ENCOUNTER — Encounter: Payer: Self-pay | Admitting: Family Medicine

## 2020-04-08 ENCOUNTER — Ambulatory Visit (INDEPENDENT_AMBULATORY_CARE_PROVIDER_SITE_OTHER): Payer: HMO | Admitting: Family Medicine

## 2020-04-08 VITALS — BP 124/80 | HR 98 | Resp 16 | Ht 72.0 in | Wt 219.0 lb

## 2020-04-08 DIAGNOSIS — E1159 Type 2 diabetes mellitus with other circulatory complications: Secondary | ICD-10-CM

## 2020-04-08 DIAGNOSIS — E7800 Pure hypercholesterolemia, unspecified: Secondary | ICD-10-CM | POA: Diagnosis not present

## 2020-04-08 DIAGNOSIS — I10 Essential (primary) hypertension: Secondary | ICD-10-CM

## 2020-04-08 DIAGNOSIS — Z23 Encounter for immunization: Secondary | ICD-10-CM

## 2020-04-08 DIAGNOSIS — I499 Cardiac arrhythmia, unspecified: Secondary | ICD-10-CM | POA: Diagnosis not present

## 2020-04-08 DIAGNOSIS — R222 Localized swelling, mass and lump, trunk: Secondary | ICD-10-CM | POA: Diagnosis not present

## 2020-04-08 DIAGNOSIS — R21 Rash and other nonspecific skin eruption: Secondary | ICD-10-CM

## 2020-04-08 MED ORDER — ATORVASTATIN CALCIUM 40 MG PO TABS
40.0000 mg | ORAL_TABLET | Freq: Every day | ORAL | 3 refills | Status: DC
Start: 2020-04-08 — End: 2021-05-10

## 2020-04-08 NOTE — Progress Notes (Addendum)
HPI: Patrick Wilcox is a 74 y.o. male, who is here today for 6 months follow up.   He was last seen on 09/23/2019. He had blood work recently. DM2: He is planning on seeing his endocrinologist tomorrow. No new problems since his last visit.  Hypertension: Currently he is on spironolactone 25 mg daily, ramipril 10 mg daily, metoprolol tartrate 100 mg twice daily, and amlodipine 10 mg daily.  He is tolerating medication well. Negative for severe/frequent headache, visual changes, CP, dyspnea, palpitations, focal neurologic deficit, or edema.  He is not checking BP's at home, sometimes at the grocery store.  He does not remember readings.  Lab Results  Component Value Date   CREATININE 0.80 04/06/2020   BUN 10 04/06/2020   NA 136 04/06/2020   K 4.1 04/06/2020   CL 101 04/06/2020   CO2 26 04/06/2020   Today I noted irregular HR, which I have not noted before. He is not sure about hx of arrhythmia. Last EKG in 09/2010 SR.  Echo ordered in 09/2019 has not been done. Negative for orthopnea or PND.  He is requesting derma referral. Rash: He has had problem for about a year Affected areas: Abdomen, lower chest, and back.  He is on nystatin-triamcinolone ointment, which has not helped. He has noted new lesions. Rash is not pruritic or tender. It is constant. He has not identified exacerbating factors. Negative for sick contact, insect bite, and outdoors known exposure.  "Growth" on his back initially noted about a year. There is no tenderness, pruritus, or skin changes.. It is slowly growing, causing irritation. He has not tried OTC treatment.  Hyperlipidemia: Currently he is on atorvastatin 40 mg daily.  Lab Results  Component Value Date   CHOL 125 09/09/2019   HDL 31.40 (L) 09/09/2019   LDLCALC 71 09/09/2019   TRIG 114.0 09/09/2019   CHOLHDL 4 09/09/2019   Review of Systems  Constitutional: Negative for activity change, appetite change, fatigue and fever.   HENT: Negative for nosebleeds and sore throat.   Respiratory: Negative for cough and wheezing.   Gastrointestinal: Negative for abdominal pain, nausea and vomiting.  Genitourinary: Negative for decreased urine volume and hematuria.  Musculoskeletal: Negative for gait problem and myalgias.  Neurological: Negative for syncope, facial asymmetry and weakness.  Psychiatric/Behavioral: Negative for confusion.  Rest of ROS, see pertinent positives sand negatives in HPI  Current Outpatient Medications on File Prior to Visit  Medication Sig Dispense Refill  . Alcohol Swabs (B-D SINGLE USE SWABS REGULAR) PADS Use as necessary 300 each 6  . allopurinol (ZYLOPRIM) 300 MG tablet TAKE 1 TABLET BY MOUTH EVERY DAY 90 tablet 1  . amLODipine (NORVASC) 10 MG tablet TAKE 1 TABLET BY MOUTH EVERY DAY 90 tablet 1  . aspirin 81 MG tablet Take 81 mg by mouth daily.    . Blood Glucose Monitoring Suppl (ONETOUCH VERIO IQ SYSTEM) w/Device KIT USE TO CHECK BLOOD SUGAR TWICE A DAY DX CODE E11.9 1 kit 2  . colchicine 0.6 MG tablet TAKE 1 TABLET BY MOUTH EVERY DAY 90 tablet 3  . EASY COMFORT PEN NEEDLES 31G X 5 MM MISC Used to check  Blood glucose 3 times daily or prn. 100 each 3  . furosemide (LASIX) 20 MG tablet TAKE 1 TABLET BY MOUTH EVERY DAY 90 tablet 1  . insulin aspart (NOVOLOG FLEXPEN) 100 UNIT/ML FlexPen Inject into the skin in the morning and at bedtime. Taking 22 units total per day.    Marland Kitchen  insulin degludec (TRESIBA FLEXTOUCH) 100 UNIT/ML FlexTouch Pen 40 units once daily 15 mL 1  . metFORMIN (GLUCOPHAGE) 1000 MG tablet TAKE ONE TABLET BY MOUTH TWICE DAILY. TAKE WITH A MEAL. 180 tablet 1  . metoprolol tartrate (LOPRESSOR) 100 MG tablet TAKE 1 TABLET BY MOUTH TWICE A DAY 180 tablet 2  . nystatin-triamcinolone ointment (MYCOLOG) APPLY TO AFFECTED AREA TWICE A DAY 60 g 0  . ONETOUCH DELICA LANCETS 16X MISC Use to check blood sugar twice daily. DX Code E11.9 200 each 1  . ONETOUCH VERIO test strip USE AS INSTRUCTED  200 strip 1  . ramipril (ALTACE) 10 MG capsule TAKE 1 CAPSULE (10 MG TOTAL) BY MOUTH 2 (TWO) TIMES DAILY. (Patient taking differently: Take 10 mg by mouth daily. ) 180 capsule 1  . sildenafil (VIAGRA) 50 MG tablet Take 1 tablet (50 mg total) by mouth daily as needed for erectile dysfunction. 90 tablet 0  . spironolactone (ALDACTONE) 25 MG tablet TAKE 1 TABLET BY MOUTH EVERY DAY 90 tablet 0   No current facility-administered medications on file prior to visit.    Past Medical History:  Diagnosis Date  . Cataract    had surgery to remove them  . CORONARY ARTERY DISEASE 11/23/2006  . DIABETES MELLITUS, TYPE II 11/23/2006  . GOUT 05/20/2008  . HYPERLIPIDEMIA 11/23/2006  . HYPERTENSION 11/23/2006  . 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 PNEUMONIA, LEFT LOWER LOBE 09/08/2009   No Known Allergies  Social History   Socioeconomic History  . Marital status: Widowed    Spouse name: Not on file  . Number of children: 2  . Years of education: Not on file  . Highest education level: Not on file  Occupational History    Comment: Retired from [employer]  . Smoking status: Never Smoker  . Smokeless tobacco: Never Used  Vaping Use  . Vaping Use: Never used  Substance and Sexual Activity  . Alcohol use: No    Comment: stopped drinking etoh completely in 09-27-99  . Drug use: No  . Sexual activity: Yes    Comment: uses viagra  Other Topics Concern  . Not on file  Social History Narrative   Lives alone   Retired from [employer] in 1999/09/27   Twin sons, one son died of a stroke this year 09-26-2018) at age 62, his living son resides in [place]    His wife died of breast cancer 11 years ago    Social Determinants of Health   Financial Resource Strain: Low Risk   . Difficulty of Paying Living Expenses: Not very hard  Food Insecurity: No Food Insecurity  . Worried About Charity fundraiser in the Last Year: Never true  . Ran Out of Food in the Last Year: Never true  Transportation  Needs: No Transportation Needs  . Lack of Transportation (Medical): No  . Lack of Transportation (Non-Medical): No  Physical Activity: Sufficiently Active  . Days of Exercise per Week: 7 days  . Minutes of Exercise per Session: 30 min  Stress: No Stress Concern Present  . Feeling of Stress : Only a little  Social Connections: Unknown  . Frequency of Communication with Friends and Family: More than three times a week  . Frequency of Social Gatherings with Friends and Family: Not on file  . Attends Religious Services: Not on file  . Active Member of Clubs or Organizations: Not on file  . Attends Archivist Meetings: Not on file  .  Marital Status: Widowed    Vitals:   04/08/20 1051  BP: 124/80  Pulse: 98  Resp: 16  SpO2: 98%   Body mass index is 29.7 kg/m.  Physical Exam Nursing note reviewed.  Constitutional:      General: He is not in acute distress.    Appearance: He is well-developed.  HENT:     Head: Normocephalic and atraumatic.     Mouth/Throat:     Mouth: Mucous membranes are moist.     Pharynx: Oropharynx is clear.  Eyes:     Conjunctiva/sclera: Conjunctivae normal.     Pupils: Pupils are equal, round, and reactive to light.  Cardiovascular:     Rate and Rhythm: Normal rate. Rhythm irregular.     Heart sounds: Murmur (SEM II-III/VI RUSB and LUSB) heard.      Comments: PT pulses present. Pulmonary:     Effort: Pulmonary effort is normal. No respiratory distress.     Breath sounds: Normal breath sounds.  Abdominal:     Palpations: Abdomen is soft. There is no hepatomegaly or mass.     Tenderness: There is no abdominal tenderness.  Musculoskeletal:       Back:  Lymphadenopathy:     Cervical: No cervical adenopathy.  Skin:    General: Skin is warm.     Findings: Rash present. Rash is not papular, pustular or urticarial.     Comments: Fine scaly hyperpigmented confluent macular rash.  There is no erythema, induration, or tenderness. On right  scapular area pediculated mass,soft,4-5 cm max diameter. No tender.See MS graphic.  Neurological:     Mental Status: He is alert and oriented to person, place, and time.     Cranial Nerves: No cranial nerve deficit.     Gait: Gait normal.  Psychiatric:     Comments: Well groomed, good eye contact.   ASSESSMENT AND PLAN:  Patrick Wilcox was seen today for 6 months follow-up.  Orders Placed This Encounter  Procedures  . Flu Vaccine QUAD High Dose(Fluad)  . Ambulatory referral to Dermatology  . Ambulatory referral to Cardiology  . EKG 12-Lead   Hypertension associated with diabetes ([place]) BP adequately controlled. Continue Spironolactone 25 mg daily,Amlodipine 10 mg daily,Ramipril 10 mg daily,and Metoprolol succinate 100 mg daily. Low salt diet. Try to monitor BP regularly.  Pure hypercholesterolemia We discussed results of last lipid panel, low HDL. Recommend continue atorvastatin 20 mg daily. Increase intake of vegetables, cook with olive oil, avocados,regular physical activity, and wine a few times per week (max 8 ounces and if no problems with alcohol intake in the past). We can repeat lipid panel in 09/2020.  Irregular heart rate Asymptomatic. EKG in 09/2008 a few PVCs. EKG in 3/2012SR, no PVC's. Today SR,normal axis,? LE, PVC's are more frequent when compared with EKG in 2010 with short period of bigeminy, a few PAC's now present. We discussed EKG findings,appt with cardiologist will be arranged. Instructed about warning signs.  Skin rash It seems to be chronic. ? Tinea. Selsun Blue shampoo from neck down for -1 min then rinse. Derma referral placed as requested.  Mass on back Most likely benign. If derma does not remove it, we can arrange procedure here in the office.  Need for influenza vaccination -     Flu Vaccine QUAD High Dose(Fluad)   Return in about 6 months (around 10/06/2020) for HTN.  Ariadne Rissmiller G. [name], MD  [facility]. [place]  office.   A few things to remember from  today's visit:   Hypertension associated with diabetes ([place])  Pure hypercholesterolemia  Irregular heart rate - Plan: EKG 12-Lead, Ambulatory referral to Cardiology  Skin rash - Plan: Ambulatory referral to Dermatology  Mass on back  Mass on back is most likely benign, if dermatologist does not remove it I can do it here in the office.   If you need refills please call your pharmacy. Do not use My Chart to request refills or for acute issues that need immediate attention.    Please be sure medication list is accurate. If a new problem present, please set up appointment sooner than planned today.

## 2020-04-08 NOTE — Patient Instructions (Addendum)
A few things to remember from today's visit:   Hypertension associated with diabetes ([place])  Pure hypercholesterolemia  Irregular heart rate - Plan: EKG 12-Lead, Ambulatory referral to Cardiology  Skin rash - Plan: Ambulatory referral to Dermatology  Mass on back  Mass on back is most likely benign, if dermatologist does not remove it I can do it here in the office.   If you need refills please call your pharmacy. Do not use My Chart to request refills or for acute issues that need immediate attention.    Please be sure medication list is accurate. If a new problem present, please set up appointment sooner than planned today.

## 2020-04-09 ENCOUNTER — Ambulatory Visit (INDEPENDENT_AMBULATORY_CARE_PROVIDER_SITE_OTHER): Payer: HMO | Admitting: Endocrinology

## 2020-04-09 ENCOUNTER — Encounter: Payer: Self-pay | Admitting: Endocrinology

## 2020-04-09 VITALS — BP 142/76 | HR 77 | Ht 72.0 in | Wt 218.0 lb

## 2020-04-09 DIAGNOSIS — I10 Essential (primary) hypertension: Secondary | ICD-10-CM | POA: Diagnosis not present

## 2020-04-09 DIAGNOSIS — E1165 Type 2 diabetes mellitus with hyperglycemia: Secondary | ICD-10-CM | POA: Diagnosis not present

## 2020-04-09 DIAGNOSIS — Z794 Long term (current) use of insulin: Secondary | ICD-10-CM | POA: Diagnosis not present

## 2020-04-09 NOTE — Progress Notes (Signed)
Patient ID: Patrick Wilcox, male   DOB: May 08, 1946, 74 y.o.   MRN: [id number]    Reason for Appointment: Followup for Type 2 Diabetes   History of Present Illness:          Diagnosis: Type 2 diabetes mellitus, date of diagnosis: 2004       Past history:  At diagnosis he was having symptoms of drowsiness, difficulty breathing and blood sugar was reportedly over 600 He was initially treated with insulin but subsequently switched back to oral drugs He may have taken metformin and other medications for some time before going back on insulin His records indicate that he had been taking Amaryl and Januvia before going back on insulin in mid-2014 when his A1c had gone up over 13 Initially was given premixed insulin and then switched to Lantus and Humalog Record of his A1c indicates that his levels previously had been over 8% since 02/2012 He  had much better blood sugar control on his last visit with his changing his lifestyle and being instructed by the nurse educator  His Lantus did not appear to have 24 hour control with once a day injection  Recent history:   INSULIN regimen is described as:  40 units Tresiba daily in a.m., Novolog 22 units after dinner once daily       Oral hypoglycemic drugs the patient is taking are: Metformin 1 g twice a day     His A1c is now up to 9.4 compared to 8%   Glucose monitoring, current management and problems identified:  He was previously taking NovoLog only in the morning and now he is taking it only in the evening despite instructions on taking it twice a day  He has taken his NovoLog 1 to 2 hours after dinner  He was also referred to the diabetes educator in June but he did not make the appointment  He says he is also eating more sweets  Fasting readings are variable and as high as 198  Despite instruction again he does not check his sugars after meals and only before breakfast  He is mostly doing some walking on the weekends and  not regularly otherwise  His weight is about the same  He does take his Metformin regularly  Hypoglycemia: None   DIET: Has seen the dietitian in 2014 and nurse educator in 8/18       Meals: 2 meals per day 10 am and 6 pm.   Breakfast is  eggs;  toast, eggs, has fruit for snacks.  Evening meal usually vegetables, tuna salad, chicken.       Compliance with the medical regimen:  Fair    Glucose monitoring: Checking less than 1 times a day        Glucometer:  One Touch Verio    Blood Glucose readings  from  Download show AVERAGE 144 Recent blood sugar range 95-198   PREVIOUS FASTING range 103-171 AVERAGE 138   Exercise:  walking about a mile, 3-5/7 days   Weight history:  Wt Readings from Last 3 Encounters:  04/09/20 218 lb (98.9 kg)  04/08/20 219 lb (99.3 kg)  01/02/20 219 lb (99.3 kg)    Glycemic control:   Lab Results  Component Value Date   HGBA1C 9.4 (H) 04/06/2020   HGBA1C 8.0 (H) 12/03/2019   HGBA1C 8.2 (H) 09/09/2019   Lab Results  Component Value Date   MICROALBUR 148.4 (H) 09/09/2019   LDLCALC 71 09/09/2019   CREATININE 0.80  04/06/2020    Other active problems: See review of systems   Lab on 04/06/2020  Component Date Value Ref Range Status  . Sodium 04/06/2020 136  135 - 145 mEq/L Final  . Potassium 04/06/2020 4.1  3.5 - 5.1 mEq/L Final  . Chloride 04/06/2020 101  96 - 112 mEq/L Final  . CO2 04/06/2020 26  19 - 32 mEq/L Final  . Glucose, Bld 04/06/2020 267* 70 - 99 mg/dL Final  . BUN 04/06/2020 10  6 - 23 mg/dL Final  . Creatinine, Ser 04/06/2020 0.80  0.40 - 1.50 mg/dL Final  . GFR 04/06/2020 114.19  >60.00 mL/min Final  . Calcium 04/06/2020 9.3  8.4 - 10.5 mg/dL Final  . Hgb A1c MFr Bld 04/06/2020 9.4* 4.6 - 6.5 % Final   Glycemic Control Guidelines for People with Diabetes:Non Diabetic:  <6%Goal of Therapy: <7%Additional Action Suggested:  >8%       Allergies as of 04/09/2020   No Known Allergies     Medication List        Accurate as of April 09, 2020  8:12 AM. If you have any questions, ask your nurse or doctor.        allopurinol 300 MG tablet Commonly known as: ZYLOPRIM TAKE 1 TABLET BY MOUTH EVERY DAY   amLODipine 10 MG tablet Commonly known as: NORVASC TAKE 1 TABLET BY MOUTH EVERY DAY   aspirin 81 MG tablet Take 81 mg by mouth daily.   atorvastatin 40 MG tablet Commonly known as: LIPITOR Take 1 tablet (40 mg total) by mouth daily.   B-D SINGLE USE SWABS REGULAR Pads Use as necessary   colchicine 0.6 MG tablet TAKE 1 TABLET BY MOUTH EVERY DAY   Easy Comfort Pen Needles 31G X 5 MM Misc Generic drug: Insulin Pen Needle Used to check  Blood glucose 3 times daily or prn.   furosemide 20 MG tablet Commonly known as: LASIX TAKE 1 TABLET BY MOUTH EVERY DAY   metFORMIN 1000 MG tablet Commonly known as: GLUCOPHAGE TAKE ONE TABLET BY MOUTH TWICE DAILY. TAKE WITH A MEAL.   metoprolol tartrate 100 MG tablet Commonly known as: LOPRESSOR TAKE 1 TABLET BY MOUTH TWICE A DAY   NovoLOG FlexPen 100 UNIT/ML FlexPen Generic drug: insulin aspart Inject into the skin in the morning and at bedtime. Taking 22 units total per day.   nystatin-triamcinolone ointment Commonly known as: MYCOLOG APPLY TO AFFECTED AREA TWICE A DAY   OneTouch Delica Lancets 36P Misc Use to check blood sugar twice daily. DX Code E11.9   OneTouch Verio IQ System w/Device Kit USE TO CHECK BLOOD SUGAR TWICE A DAY DX CODE E11.9   OneTouch Verio test strip Generic drug: glucose blood USE AS INSTRUCTED   ramipril 10 MG capsule Commonly known as: ALTACE TAKE 1 CAPSULE (10 MG TOTAL) BY MOUTH 2 (TWO) TIMES DAILY. What changed: See the new instructions.   sildenafil 50 MG tablet Commonly known as: VIAGRA Take 1 tablet (50 mg total) by mouth daily as needed for erectile dysfunction.   spironolactone 25 MG tablet Commonly known as: ALDACTONE TAKE 1 TABLET BY MOUTH EVERY DAY   Tresiba FlexTouch 100 UNIT/ML FlexTouch  Pen Generic drug: insulin degludec 40 units once daily       Allergies:  No Known Allergies  Past Medical History:  Diagnosis Date  . Cataract    had surgery to remove them  . CORONARY ARTERY DISEASE 11/23/2006  . DIABETES MELLITUS, TYPE II 11/23/2006  . GOUT  05/20/2008  . HYPERLIPIDEMIA 11/23/2006  . HYPERTENSION 11/23/2006  . 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 PNEUMONIA, LEFT LOWER LOBE 09/08/2009    Past Surgical History:  Procedure Laterality Date  . CORONARY ANGIOPLASTY WITH STENT PLACEMENT    . CORONARY ARTERY BYPASS GRAFT  2003    Family History  Problem Relation Age of Onset  . Heart disease Mother   . Heart disease Father   . Diabetes Sister   . Diabetes Brother     Social History:  reports that he has never smoked. He has never used smokeless tobacco. He reports that he does not drink alcohol and does not use drugs.    Review of Systems        Lipids:  LDL has been treated to goal with Lipitor 40 mg Has history of CAD, followed by PCP HDL has been low       Lab Results  Component Value Date   CHOL 125 09/09/2019   HDL 31.40 (L) 09/09/2019   LDLCALC 71 09/09/2019   TRIG 114.0 09/09/2019   CHOLHDL 4 09/09/2019       The blood pressure has been treated with Norvasc10 mg, ramipril and metoprolol, Followed by PCP He is supposed to be taking her ramipril twice a day but he says he only does this once a day  Blood pressure is improved with adding Aldactone 25 mg  He is also on Lasix for unknown reasons Not checking any blood pressure readings at home, he says he has difficulty using his meter  BP Readings from Last 3 Encounters:  04/09/20 (!) 142/76  04/08/20 124/80  64/15/83 094/07     Complications: No evidence of neuropathy or nephropathy  Diabetic foot exam was done in 11/20 by his PCP  Last exam with eye doctor  was 4/20        LABS:  Lab on 04/06/2020  Component Date Value Ref Range Status  . Sodium 04/06/2020 136  135 -  145 mEq/L Final  . Potassium 04/06/2020 4.1  3.5 - 5.1 mEq/L Final  . Chloride 04/06/2020 101  96 - 112 mEq/L Final  . CO2 04/06/2020 26  19 - 32 mEq/L Final  . Glucose, Bld 04/06/2020 267* 70 - 99 mg/dL Final  . BUN 04/06/2020 10  6 - 23 mg/dL Final  . Creatinine, Ser 04/06/2020 0.80  0.40 - 1.50 mg/dL Final  . GFR 04/06/2020 114.19  >60.00 mL/min Final  . Calcium 04/06/2020 9.3  8.4 - 10.5 mg/dL Final  . Hgb A1c MFr Bld 04/06/2020 9.4* 4.6 - 6.5 % Final   Glycemic Control Guidelines for People with Diabetes:Non Diabetic:  <6%Goal of Therapy: <7%Additional Action Suggested:  >8%     Physical Examination:  BP (!) 142/76   Pulse 77   Ht 6' (1.829 m)   Wt 218 lb (98.9 kg)   SpO2 99%   BMI 29.57 kg/m      ASSESSMENT/PLAN  Diabetes type 2, On insulin   See history of present illness for detailed discussion of current management, problems identified and blood sugar patterns.  A1c is unusually high at 9.4  Although his blood sugars are improving on the last visit he still does not understand the need to take NovoLog with every meal Also did not see the diabetes educator as recommended for detailed in basic diabetes education regarding insulin  Recommendations: Discussed onset of action of rapid acting insulin and need to time it before eating Discussed that he is likely  having postprandial blood sugar spikes which he is not monitoring Reminded him that NovoLog has to be taken twice a day before each meal He does need to consistently check readings couple of hours after meals Does not need to check fasting readings every day Have shown him a graph of the blood sugar patterns and insulin action  Given his daily insulin instructions and table format and instructed him on taking NovoLog before breakfast and dinner For simplicity will take 20 units of NovoLog in the morning and 22 before dinner Emphasized the need to take it before he starts eating [name] will be continued at 40  units He will call if he is having any low sugars He will cut back on desserts and sweets More regular walking during the weekdays  Short-term follow-up in 2 weeks  HYPERTENSION: Blood pressure is somewhat variable but was excellent yesterday with PCP Also has a consistently normal potassium with adding Aldactone 25 mg which he will continue   He can take his booster shot for the Covid since he is due now  There are no Patient Instructions on file for this visit.       [name] 04/09/2020, 8:12 AM   Note: This office note was prepared with Dragon voice recognition system technology. Any transcriptional errors that result from this process are unintentional.

## 2020-04-09 NOTE — Patient Instructions (Signed)
Check blood sugars on waking up 3-4 days a week  Also check blood sugars about 2 hours after meals and do this after different meals by rotation  Recommended blood sugar levels on waking up are 90-130 and about 2 hours after meal is 130-180  Please bring your blood sugar monitor to each visit, thank you  [facility] [place]

## 2020-04-22 ENCOUNTER — Telehealth: Payer: Self-pay | Admitting: *Deleted

## 2020-04-22 NOTE — Telephone Encounter (Signed)
Dr. Loletha Carrow, This pt is coming in for a PV on 04-28-20- he has seen Dr. Sharlett Iles in the past for a colonoscopy in 2010.  No polyps seen at that time.  Would you please review his chart?  He has an appt with cardiology on 05-01-20 for an irregular heart rate.  He does have a hx of CAD, DM, MI (2003), HTN.  How would you like to proceed?  If cleared by cardiology, ok for direct?  Would you like an OV?  Please advise  Thanks, Cyril Mourning

## 2020-04-27 NOTE — Telephone Encounter (Signed)
Spoke with the patient and I gave him Dr.Danis' recommendations. PV and colon cancelled and made OV 1st available with Dr.Danis on 06/18/2020-pt is aware. Mailed appointment information to the patient.

## 2020-04-27 NOTE — Telephone Encounter (Signed)
Thank you for the note.  His diabetes has also not been under good control, based on recent endocrinology office note.  This patient should have an office appointment with me to determine if/when he is ready for a routine colonoscopy.  Please contact him, cancel the pre-visit and the colonoscopy appt and help arrange a clinic visit with me (next available is probably later in Nov or Dec)  - HD

## 2020-05-01 ENCOUNTER — Ambulatory Visit: Payer: HMO | Admitting: Internal Medicine

## 2020-05-01 NOTE — Progress Notes (Deleted)
Cardiology Office Note:    Date:  05/01/2020   ID:  Patrick Wilcox, DOB 12-03-1945, MRN [id number]  PCP:  [name], [name], MD  [place] Cardiologist:  No primary care provider on file.  [facility] Electrophysiologist:  None   CC: Consulted for the evaluation of irregular heart rate at the behest of [name], [name], MD   History of Present Illness:       Patient notes ***  Past Medical History:  Diagnosis Date  . Cataract    had surgery to remove them  . CORONARY ARTERY DISEASE 11/23/2006  . DIABETES MELLITUS, TYPE II 11/23/2006  . GOUT 05/20/2008  . HYPERLIPIDEMIA 11/23/2006  . HYPERTENSION 11/23/2006  . 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 PNEUMONIA, LEFT LOWER LOBE 09/08/2009    Past Surgical History:  Procedure Laterality Date  . CORONARY ANGIOPLASTY WITH STENT PLACEMENT    . CORONARY ARTERY BYPASS GRAFT  September 30, 2001   Current Medications: No outpatient medications have been marked as taking for the 05/01/20 encounter (Appointment) with Werner Lean, MD.    Allergies:   Patient has no known allergies.   Social History   Socioeconomic History  . Marital status: Widowed    Spouse name: Not on file  . Number of children: 2  . Years of education: Not on file  . Highest education level: Not on file  Occupational History    Comment: Retired from [employer]  . Smoking status: Never Smoker  . Smokeless tobacco: Never Used  Vaping Use  . Vaping Use: Never used  Substance and Sexual Activity  . Alcohol use: No    Comment: stopped drinking etoh completely in October 01, 1999  . Drug use: No  . Sexual activity: Yes    Comment: uses viagra  Other Topics Concern  . Not on file  Social History Narrative   Lives alone   Retired from [employer] in 10/01/99   Twin sons, one son died of a stroke this year 10/01/2018) at age 4, his living son resides in [place]    His wife died of breast cancer 11 years ago    Social Determinants of Health    Financial Resource Strain: Low Risk   . Difficulty of Paying Living Expenses: Not very hard  Food Insecurity: No Food Insecurity  . Worried About Charity fundraiser in the Last Year: Never true  . Ran Out of Food in the Last Year: Never true  Transportation Needs: No Transportation Needs  . Lack of Transportation (Medical): No  . Lack of Transportation (Non-Medical): No  Physical Activity: Sufficiently Active  . Days of Exercise per Week: 7 days  . Minutes of Exercise per Session: 30 min  Stress: No Stress Concern Present  . Feeling of Stress : Only a little  Social Connections: Unknown  . Frequency of Communication with Friends and Family: More than three times a week  . Frequency of Social Gatherings with Friends and Family: Not on file  . Attends Religious Services: Not on file  . Active Member of Clubs or Organizations: Not on file  . Attends Archivist Meetings: Not on file  . Marital Status: Widowed    Family History: The patient's family history includes Diabetes in his brother and sister; Heart disease in his father and mother.  ROS:   Please see the history of present illness.    All other systems reviewed and are negative.  EKGs/Labs/Other Studies Reviewed:    The  following studies were reviewed today:  EKG:   04/08/2020- Sinus rhythm rate 76 with frequent PACs and PVCs  Recent Labs: 09/09/2019: ALT 22 04/06/2020: BUN 10; Creatinine, Ser 0.80; Potassium 4.1; Sodium 136  Recent Lipid Panel    Component Value Date/Time   CHOL 125 09/09/2019 0947   TRIG 114.0 09/09/2019 0947   TRIG 59 05/30/2006 0949   HDL 31.40 (L) 09/09/2019 0947   CHOLHDL 4 09/09/2019 0947   VLDL 22.8 09/09/2019 0947   LDLCALC 71 09/09/2019 0947   Normal MPI Stress test 10/06/2015  Physical Exam:    VS:  There were no vitals taken for this visit.    Wt Readings from Last 3 Encounters:  04/09/20 218 lb (98.9 kg)  04/08/20 219 lb (99.3 kg)  01/02/20 219 lb (99.3 kg)      GEN: *** Well nourished, well developed in no acute distress HEENT: Normal NECK: No JVD; No carotid bruits LYMPHATICS: No lymphadenopathy CARDIAC: ***RRR, no murmurs, rubs, gallops RESPIRATORY:  Clear to auscultation without rales, wheezing or rhonchi  ABDOMEN: Soft, non-tender, non-distended MUSCULOSKELETAL:  No edema; No deformity  SKIN: Warm and dry NEUROLOGIC:  Alert and oriented x 3 PSYCHIATRIC:  Normal affect   ASSESSMENT:    No diagnosis found. PLAN:    In order of problems listed above:  CAD s/p CABG Diabetes with HTN; HLD PVCs - asymptomatic CAD but with new PVCs - anatomy: LIMA to LAD, SVG to RI, sequential SVG to OM 1 and 2, SVG to PDA - continue ASA 81 mg; Continue *** until *** - continue statin, goal LDL < 70, presently at 71, - continue BB ***  - continue ACEi - continuing lasix and aldactone - will get echocardiogram - will get 7 day non-live ziopatch for PVC burden - low threshold for NM Stress - will see in 2-3 months after testing   Medication Adjustments/Labs and Tests Ordered: Current medicines are reviewed at length with the patient today.  Concerns regarding medicines are outlined above.  No orders of the defined types were placed in this encounter.  No orders of the defined types were placed in this encounter.   There are no Patient Instructions on file for this visit.   Signed, Werner Lean, MD  05/01/2020 9:11 AM    [place]

## 2020-05-05 ENCOUNTER — Other Ambulatory Visit: Payer: Self-pay | Admitting: Family Medicine

## 2020-05-06 ENCOUNTER — Ambulatory Visit: Payer: HMO | Admitting: Pharmacist

## 2020-05-06 DIAGNOSIS — E7800 Pure hypercholesterolemia, unspecified: Secondary | ICD-10-CM

## 2020-05-06 DIAGNOSIS — E1159 Type 2 diabetes mellitus with other circulatory complications: Secondary | ICD-10-CM

## 2020-05-06 NOTE — Chronic Care Management (AMB) (Signed)
Chronic Care Management Pharmacy  Name: Patrick Wilcox  MRN: [id number] DOB: 09/22/1945  Initial Questions: 1. Have you seen any other providers since your last visit? Yes  2. Any changes in your medicines or health? No   Chief Complaint/ HPI  Patrick Wilcox,  74 y.o. , male presents for their Follow-Up CCM visit with the clinical pharmacist via telephone due to COVID-19 Pandemic.  Update 02/17/2020:  Patient reported his blood sugars are doing well. He states he has not heard back from patient assistance programs, but states currently is not paying anything for both of this insulins.  Patient requested prescription for sildenafil sent to [facility] for 90 day supply.   Patient inquired if he needs to continue spironolactone. He notes he has not been checking blood pressure at home since he "feels blood pressure monitor is not accurate".     Update 01/16/2020:  Patient stated he was not approved due to income. States he obtained a letter in the mail. Patient is not sure whic program he was denied.     Update: 12/16/2019 Patient reports overall doing well. Main concern and focus he would like is seeing about patient assistance for insulin.  Patient's biggest concern was insulin cost. Patient assistance forms were faxed by Dr. Ronnie Derby office on 12/12/19.   Patient did not know outcome on Extra Help program application.   Patient reports he was able to pick up spironolactone and start it. He reports not getting good BP readings. Plans to bring by at next visit. Does not want to record any numbers since he is not sure they are accurate.   PCP : [name], [name], MD  Their chronic conditions include: HTN, HLD/ coronary atherosclerosis/ history of MI, DM, Gout  Office Visits: 04/08/20 [name], MD: Patient presented for office visit for 6 month follow up on chronic conditions. Patient is still not checking BP at home but does sometimes as the grocery store and cannot recall  readings. Noted irregular HR during office visit. Referral sent for dermatology and cardiology.  09/23/2019- Patient presented for office visit with Dr. [name], MD for follow up. For DM, patient is on Novolog 22 units after meals and Tresiba 40 units. Reported BGs: 80s-110s. A1c: 8.2 (09/09/2019). For HTN, BP at office visit: 136/70. Patient on amlodipine 41m, metoprolol tartrate 1036mBID, ramipril 1053mBID. DM: since A1c is not at goal, patient to arrange att with endocrinologist. No changes on HTN, HLD, gout regimen. Patient to continue low fat diet and low salt diet. Patient to return in 4 months for CPE.   Consult Visit: 04/09/20 AjaElayne SnareD: Patient presented for diabetes follow up. A1C increased from 8 to 9.4%. Patient was only taking Novolog in the evening despite instructions to take twice daily. Patient missed appt with diabetes educator and is also eating more sweets. BP was elevated in office visit.  01/02/2020- Endocrinology- AjaElayne SnareD- Patient presented for office visit for DM follow up. Patient instructed to check BGs at 7-9pm to decide evening dose of Novolog. Patient to take Novolog 10-12 units before meals based on meal size. Patient to return in 3 months.   12/06/2019- Endocrinology- Patient presented for office visit for follow up. OV BP: 160/84 mmHg. Spironolactone 64m52marted. Patient found to be taking ramipril once daily instead of BID on directions. Patient to continue ramipril once daily. Recheck in 3 weeks as well as repeat renal function.   10/21/2019- Endocrinology- Patient presented for office visit with  Dr. [name], MD. BP: 160/74mHg for uncontrolled type 2 diabetes mellitus. Patient counseled on nature of mealtime insulin compared to basal insulin. Does not check BG readings; importance was discussed. Patient was reassured metformin does not cause headaches and needs to take the 2 tablets separately. Patient to follow up with PCP on HTN regimen. Consider  switching Norvasc to diltiazem for better renal effects.   Medications: Outpatient Encounter Medications as of 05/06/2020  Medication Sig  . Alcohol Swabs (B-D SINGLE USE SWABS REGULAR) PADS Use as necessary  . allopurinol (ZYLOPRIM) 300 MG tablet TAKE 1 TABLET BY MOUTH EVERY DAY  . amLODipine (NORVASC) 10 MG tablet TAKE 1 TABLET BY MOUTH EVERY DAY  . aspirin 81 MG tablet Take 81 mg by mouth daily.  .Marland Kitchenatorvastatin (LIPITOR) 40 MG tablet Take 1 tablet (40 mg total) by mouth daily.  . Blood Glucose Monitoring Suppl (ONETOUCH VERIO IQ SYSTEM) w/Device KIT USE TO CHECK BLOOD SUGAR TWICE A DAY DX CODE E11.9  . colchicine 0.6 MG tablet TAKE 1 TABLET BY MOUTH EVERY DAY  . EASY COMFORT PEN NEEDLES 31G X 5 MM MISC Used to check  Blood glucose 3 times daily or prn.  . furosemide (LASIX) 20 MG tablet TAKE 1 TABLET BY MOUTH EVERY DAY  . insulin aspart (NOVOLOG FLEXPEN) 100 UNIT/ML FlexPen Inject into the skin in the morning and at bedtime. Taking 22 units total per day.  . insulin degludec (TRESIBA FLEXTOUCH) 100 UNIT/ML FlexTouch Pen 40 units once daily  . metFORMIN (GLUCOPHAGE) 1000 MG tablet TAKE ONE TABLET BY MOUTH TWICE DAILY. TAKE WITH A MEAL.  . metoprolol tartrate (LOPRESSOR) 100 MG tablet TAKE 1 TABLET BY MOUTH TWICE A DAY  . nystatin-triamcinolone ointment (MYCOLOG) APPLY TO AFFECTED AREA TWICE A DAY  . ONETOUCH DELICA LANCETS 327PMISC Use to check blood sugar twice daily. DX Code E11.9  . ONETOUCH VERIO test strip USE AS INSTRUCTED  . ramipril (ALTACE) 10 MG capsule TAKE 1 CAPSULE (10 MG TOTAL) BY MOUTH 2 (TWO) TIMES DAILY. (Patient taking differently: Take 10 mg by mouth daily. )  . sildenafil (VIAGRA) 50 MG tablet Take 1 tablet (50 mg total) by mouth daily as needed for erectile dysfunction.  .Marland Kitchenspironolactone (ALDACTONE) 25 MG tablet TAKE 1 TABLET BY MOUTH EVERY DAY   No facility-administered encounter medications on file as of 05/06/2020.     Current Diagnosis/Assessment:  Goals  Addressed            This Visit's Progress   . Pharmacy Care Plan       CARE PLAN ENTRY  Current Barriers:  . Chronic Disease Management support, education, and care coordination needs related to Hypertension, Hyperlipidemia, Diabetes, and coronary atherosclerosis/ history of MI, gout   Hypertension . Pharmacist Clinical Goal(s): o Over the next 30 days, patient will work with PharmD and providers to achieve BP goal <140/90 . Current regimen:  o amlodipine 150m 1 tablet once daily o metoprolol tartrate 10075m1 tablet twice daily o ramipril 21m23m capsule once daily  o furosemide 20mg6mtablet once daily  o Spironolactone 25mg,45mablet once daily . Interventions: o We discussed: DASH diet:  following a diet emphasizing fruits and vegetables and low-fat dairy products along with whole grains, fish, poultry, and nuts. Reducing red meats and sugars.  o Discussed the importance of checking blood pressure at home. . Patient self care activities - Over the next 30 days, patient will: o Check BP daily and follow up  visit in 4 weeks for reassessment.  o Patient to bring blood pressure monitor to cardiology appointment.  Hyperlipidemia/ coronary atherosclerosis/ history of MI Lab Results  Component Value Date/Time   LDLCALC 71 09/09/2019 09:47 AM .  Pharmacist Clinical Goal(s): o Over the next 90 days, patient will work with PharmD and providers to maintain LDL goal < 70 . Current regimen:  o atorvstatin 46m, 1 tablet once daily  o aspirin 846m 1 tablet once daily  . Interventions: o We discussed how a diet high in plant sterols (fruits/vegetables/nuts/whole grains/legumes) may reduce your cholesterol.  . Patient self care activities - Over the next 90 days, patient will: o Continue current medications.   Diabetes Hgb A1c MFr Bld  Date Value Ref Range Status  04/06/2020 9.4 (H) 4.6 - 6.5 % Final    Comment:    Glycemic Control Guidelines for People with Diabetes:Non  Diabetic:  <6%Goal of Therapy: <7%Additional Action Suggested:  >8%  .  Pharmacist Clinical Goal(s): o Over the next 30 days, patient will work with PharmD and providers to achieve A1c goal  as per Dr. KuDwyane Dee. Current regimen:  o insulin aspart (Novolog Flexpen), inject 10 to 12 units before breakfast and 12 units at dinner o insulin degludec (TTyler Aaslextouch), inject 40 units at bedtime  o metformin 100020m1 tablet twice daily  . Patient self care activities - Over the next 90 days, patient will: o Check blood sugar twice daily, document, and provide at future appointments o Contact provider with any episodes of hypoglycemia  Gout . Pharmacist Clinical Goal(s) o Over the next 90 days, patient will work with PharmD and providers to prevent gout flare-ups.  . Current regimen:  o allopurinol 300m73m tablet once daily  o colchicine 0.6mg,64mtablet once daily as needed . Patient self care activities o Continue current medications.   Medication management . Pharmacist Clinical Goal(s): o Over the next 90 days, patient will work with PharmD and providers to achieve optimal medication adherence . [facility]  . Interventions o Comprehensive medication review performed. o Continue current medication management strategy o Consider [facility] pharmacy for medication synchronization, packaging and delivery . Patient self care activities - Over the next 90 days, patient will: o Take medications as prescribed o Report any questions or concerns to PharmD and/or provider(s)  Please see past updates related to this goal by clicking on the "Past Updates" button in the selected goal           Diabetes  Patient reported being diagnosed in 2003.  Managed by Dr. KumarDwyane Deeecent Relevant Labs: Lab Results  Component Value Date/Time   HGBA1C 9.4 (H) 04/06/2020 09:27 AM   HGBA1C 8.0 (H) 12/03/2019 09:36 AM   MICROALBUR 148.4 (H) 09/09/2019 09:47 AM   MICROALBUR 6.8 (H)  03/16/2018 09:31 AM    Checking BG: 2x per Day  Before meals: 99, 73, 142, 121, 86, 97, 74, 87  Patient is currently uncontrolled (based on blood sugars) on the following medications:   insulin aspart (Novolog Flexpen), inject 10-12 units at breakfast if needed and 12 units at suppertime   insulin degludec (TresTyler Aastouch), inject 40 units at suppertime (changing times)   metformin 1000mg,25mablet twice daily   Last diabetic Eye exam:  Lab Results  Component Value Date/Time   HMDIABEYEEXA No Retinopathy 10/09/2017 12:00 AM  - patient was looking for Dr. TannerMellissa Kohutr to schedule routine eye exam - provided phone number.  Last diabetic Foot exam:  Lab Results  Component Value Date/Time   HMDIABFOOTEX done 08/22/2011 12:00 AM     We discussed: how to recognize and treat signs of hypoglycemia (denies symptoms) -No low BGs - left arm feels weak at times -Diet: Patient reports stopping eating as much and has reduced soda intake; currently drinks 7 ounce sodas (2 x day) and sometimes will replace with juice - encouraged patient to swap out one soda for water -Exercise: patient reports he is walking 2-3 times a day typically in stores and is working on building endurance to walk during homecoming (about 3-4 miles in a day); recommended 15 minutes of walking a day around neighborhood   Plan Managed by endo (Dr. Dwyane Dee). Patient will plan to call Dr. Mellissa Kohut office to schedule eye exam.  Consider the use of CGM for patient to better control BGs. Plan to discuss at next follow up. Continue current medications   Hypertension   Denies dizziness/ lightheadedness. Denies HA/vision changes.   Office blood pressures are:  BP Readings from Last 3 Encounters:  04/09/20 (!) 142/76  04/08/20 124/80  01/02/20 140/76   Patient checks BP at home currently not checking; will bring to cardiology appointment  Patient home BP readings are ranging: currently not checking  Patient is  better controlled (based on last visit BP) on:   amlodipine 47m, 1 tablet once daily  metoprolol tartrate 1034m 1 tablet twice daily  ramipril 1073m1 capsule once daily   furosemide 21m60m tablet once daily   sprironolactone  25mg29mtablet once daily   We discussed:  - importance of adherence of blood pressure medications and avoid discontinuing medications without consulting with providers first.  -the importance of checking blood pressure at home  Plan Will call to remind patient to bring BP monitor to cardiologist appt on Nov 2nd _0 :30am. Continue current medications   Hyperlipidemia/ Coronary atherosclerosis/ Hx of MI  LDL goal < 70  Lipid Panel     Component Value Date/Time   CHOL 125 09/09/2019 0947   TRIG 114.0 09/09/2019 0947   TRIG 59 05/30/2006 0949   HDL 31.40 (L) 09/09/2019 0947   CHOLHDL 4 09/09/2019 0947   VLDL 22.8 09/09/2019 0947   LDLCALC 71 09/09/2019 0947    The ASCVD Risk score (Goff DC Jr., et al., 2013) failed to calculate for the following reasons:   The patient has a prior MI or stroke diagnosis   Patient is currently controlled on the following medications:  - atorvstatin 40mg,46mablet once daily - aspirin 81mg, 12mblet once daily   We discussed:  diet and exercise extensively   . We discussed how a diet high in plant sterols (fruits/vegetables/nuts/whole grains/legumes) may reduce your cholesterol.   Plan Continue current medications  Gout   Patient reported last flare-up being over a year ago.   Patient is currently controlled on the following medications:   allopurinol 300mg, 135mlet once daily   colchicine 0.6mg, 1 t25met once daily as needed  Uric acid: 4.4 (09/09/2019)   Plan Continue current medications  Medication Management   [facility] Uses pill box? Yes - patient recently bought one and reports it is helping; still occasionally forgets to take evening dose of metoprolol -  recommended using a timer Pt endorses 90% compliance  We discussed: Discussed benefits of medication synchronization, packaging and delivery as well as enhanced pharmacist oversight with [facility].; patient is not satisfied with current  pharmacy as they always say things are ready when they aren't but he is currently not paying anything for medicines  Plan  Continue current medication management strategy - will plan to discuss [facility] again next year to determine coverage   Consider switch of metoprolol tartrate to metoprolol succinate to improve medication adherence.   Follow up: 3 month phone visit with CPP 1 month BP assessment with CPA   Jeni Salles, PharmD Clinical Pharmacist [facility] [place] [phone]

## 2020-05-12 ENCOUNTER — Encounter: Payer: HMO | Admitting: Gastroenterology

## 2020-05-12 ENCOUNTER — Encounter: Payer: Self-pay | Admitting: *Deleted

## 2020-05-12 ENCOUNTER — Other Ambulatory Visit: Payer: Self-pay

## 2020-05-12 ENCOUNTER — Ambulatory Visit: Payer: HMO | Admitting: Cardiology

## 2020-05-12 ENCOUNTER — Encounter: Payer: Self-pay | Admitting: Cardiology

## 2020-05-12 VITALS — BP 150/88 | HR 77 | Ht 72.0 in | Wt 223.0 lb

## 2020-05-12 DIAGNOSIS — R21 Rash and other nonspecific skin eruption: Secondary | ICD-10-CM

## 2020-05-12 DIAGNOSIS — E1169 Type 2 diabetes mellitus with other specified complication: Secondary | ICD-10-CM

## 2020-05-12 DIAGNOSIS — E1159 Type 2 diabetes mellitus with other circulatory complications: Secondary | ICD-10-CM | POA: Diagnosis not present

## 2020-05-12 DIAGNOSIS — Z794 Long term (current) use of insulin: Secondary | ICD-10-CM | POA: Diagnosis not present

## 2020-05-12 DIAGNOSIS — I493 Ventricular premature depolarization: Secondary | ICD-10-CM | POA: Diagnosis not present

## 2020-05-12 DIAGNOSIS — I152 Hypertension secondary to endocrine disorders: Secondary | ICD-10-CM | POA: Diagnosis not present

## 2020-05-12 NOTE — Patient Instructions (Addendum)
Medication Instructions:  Your physician recommends that you continue on your current medications as directed. Please refer to the Current Medication list given to you today. *If you need a refill on your cardiac medications before your next appointment, please call your pharmacy*  Lab Work: None ordered. If you have labs (blood work) drawn today and your tests are completely normal, you will receive your results only by: Marland Kitchen MyChart Message (if you have MyChart) OR . A paper copy in the mail If you have any lab test that is abnormal or we need to change your treatment, we will call you to review the results.  Testing/Procedures: Your physician has recommended that you wear a holter monitor. Holter monitors are medical devices that record the heart's electrical activity. Doctors most often use these monitors to diagnose arrhythmias. Arrhythmias are problems with the speed or rhythm of the heartbeat. The monitor is a small, portable device. You can wear one while you do your normal daily activities. This is usually used to diagnose what is causing palpitations/syncope (passing out).  You will wear a 7 day heart monitor  Your physician has requested that you have an echocardiogram. Echocardiography is a painless test that uses sound waves to create images of your heart. It provides your doctor with information about the size and shape of your heart and how well your heart's chambers and valves are working. This procedure takes approximately one hour. There are no restrictions for this procedure.  Please schedule for ECHO   Follow-Up: At [facility], you and your health needs are our priority.  As part of our continuing mission to provide you with exceptional heart care, we have created designated Provider Care Teams.  These Care Teams include your primary Cardiologist (physician) and Advanced Practice Providers (APPs -  Physician Assistants and Nurse Practitioners) who all work together to  provide you with the care you need, when you need it.  We recommend signing up for the patient portal called "MyChart".  Sign up information is provided on this After Visit Summary.  MyChart is used to connect with patients for Virtual Visits (Telemedicine).  Patients are able to view lab/test results, encounter notes, upcoming appointments, etc.  Non-urgent messages can be sent to your provider as well.   To learn more about what you can do with MyChart, go to [email or web address].    Your next appointment:   Your physician wants you to follow-up in: 6 weeks with Dr. Quentin Ore.     ZIO XT- Long Term Monitor Instructions   Your physician has requested you wear your ZIO patch monitor_7__days.   This is a single patch monitor.  [employer] supplies one patch monitor per enrollment.  Additional stickers are not available.   Please do not apply patch if you will be having a Nuclear Stress Test, Echocardiogram, Cardiac CT, MRI, or Chest Xray during the time frame you would be wearing the monitor. The patch cannot be worn during these tests.  You cannot remove and re-apply the ZIO XT patch monitor.   Your ZIO patch monitor will be sent USPS Priority mail from [facility] directly to your home address. The monitor may also be mailed to a PO BOX if home delivery is not available.   It may take 3-5 days to receive your monitor after you have been enrolled.   Once you have received you monitor, please review enclosed instructions.  Your monitor has already been registered assigning a specific monitor serial # to you.  Applying the monitor   Shave hair from upper left chest.   Hold abrader disc by orange tab.  Rub abrader in 40 strokes over left upper chest as indicated in your monitor instructions.   Clean area with 4 enclosed alcohol pads .  Use all pads to assure are is cleaned thoroughly.  Let dry.   Apply patch as indicated in monitor instructions.  Patch will be place under collarbone  on left side of chest with arrow pointing upward.   Rub patch adhesive wings for 2 minutes.Remove white label marked "1".  Remove white label marked "2".  Rub patch adhesive wings for 2 additional minutes.   While looking in a mirror, press and release button in center of patch.  A small green light will flash 3-4 times .  This will be your only indicator the monitor has been turned on.     Do not shower for the first 24 hours.  You may shower after the first 24 hours.   Press button if you feel a symptom. You will hear a small click.  Record Date, Time and Symptom in the Patient Log Book.   When you are ready to remove patch, follow instructions on last 2 pages of Patient Log Book.  Stick patch monitor onto last page of Patient Log Book.   Place Patient Log Book in [place] box.  Use locking tab on box and tape box closed securely.  The [employer] has [employer] on it.  Please place in mailbox as soon as possible.  Your physician should have your test results approximately 7 days after the monitor has been mailed back to [facility].   Call [place] at [phone] if you have questions regarding your ZIO XT patch monitor.  Call them immediately if you see an orange light blinking on your monitor.   If your monitor falls off in less than 4 days contact our Monitor department at [phone].  If your monitor becomes loose or falls off after 4 days call [employer] at [phone] for suggestions on securing your monitor.

## 2020-05-12 NOTE — Progress Notes (Signed)
Electrophysiology Office Note:    Date:  05/12/2020   ID:  Patrick Wilcox, DOB 12-Dec-1945, MRN [id number]  PCP:  [name], [name], MD  [place] Cardiologist:  No primary care provider on file.  [facility] Electrophysiologist:  None   Referring MD: [name], [name], MD   Chief Complaint: Palpitations  History of Present Illness:    Patrick Wilcox is a 74 y.o. male who presents for an evaluation of palpitations and PVCs at the request of Dr. [name]. Their medical history includes coronary artery disease post bypass surgery, hypertension, hyperlipidemia.  Patient tells me he is reestablishing in the medical care after several years.  He has been doing well and has no limitations in his day-to-day activities.  He does not feel any palpitations despite having PVCs on multiple prior EKGs.  No chest pain.  He does endorse a shoulder weakness on the left side that has no relationship to activity.  No syncope or presyncope.  Past Medical History:  Diagnosis Date  . Cataract    had surgery to remove them  . CORONARY ARTERY DISEASE 11/23/2006  . DIABETES MELLITUS, TYPE II 11/23/2006  . GOUT 05/20/2008  . HYPERLIPIDEMIA 11/23/2006  . HYPERTENSION 11/23/2006  . 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 PNEUMONIA, LEFT LOWER LOBE 09/08/2009    Past Surgical History:  Procedure Laterality Date  . CORONARY ANGIOPLASTY WITH STENT PLACEMENT    . CORONARY ARTERY BYPASS GRAFT  2003    Current Medications: Current Meds  Medication Sig  . Alcohol Swabs (B-D SINGLE USE SWABS REGULAR) PADS Use as necessary  . allopurinol (ZYLOPRIM) 300 MG tablet TAKE 1 TABLET BY MOUTH EVERY DAY  . amLODipine (NORVASC) 10 MG tablet TAKE 1 TABLET BY MOUTH EVERY DAY  . aspirin 81 MG tablet Take 81 mg by mouth daily.  Marland Kitchen atorvastatin (LIPITOR) 40 MG tablet Take 1 tablet (40 mg total) by mouth daily.  . Blood Glucose Monitoring Suppl (ONETOUCH VERIO IQ SYSTEM) w/Device KIT USE TO CHECK BLOOD SUGAR TWICE A DAY DX  CODE E11.9  . colchicine 0.6 MG tablet TAKE 1 TABLET BY MOUTH EVERY DAY  . EASY COMFORT PEN NEEDLES 31G X 5 MM MISC Used to check  Blood glucose 3 times daily or prn.  . furosemide (LASIX) 20 MG tablet TAKE 1 TABLET BY MOUTH EVERY DAY  . insulin aspart (NOVOLOG FLEXPEN) 100 UNIT/ML FlexPen Inject into the skin in the morning and at bedtime. Taking 22 units total per day.  . insulin degludec (TRESIBA FLEXTOUCH) 100 UNIT/ML FlexTouch Pen 40 units once daily  . metFORMIN (GLUCOPHAGE) 1000 MG tablet TAKE ONE TABLET BY MOUTH TWICE DAILY. TAKE WITH A MEAL.  . metoprolol tartrate (LOPRESSOR) 100 MG tablet TAKE 1 TABLET BY MOUTH TWICE A DAY  . nystatin-triamcinolone ointment (MYCOLOG) APPLY TO AFFECTED AREA TWICE A DAY  . ONETOUCH DELICA LANCETS 38S MISC Use to check blood sugar twice daily. DX Code E11.9  . ONETOUCH VERIO test strip USE AS INSTRUCTED  . ramipril (ALTACE) 10 MG capsule TAKE 1 CAPSULE (10 MG TOTAL) BY MOUTH 2 (TWO) TIMES DAILY. (Patient taking differently: Take 10 mg by mouth daily. )  . sildenafil (VIAGRA) 50 MG tablet Take 1 tablet (50 mg total) by mouth daily as needed for erectile dysfunction.  Marland Kitchen spironolactone (ALDACTONE) 25 MG tablet TAKE 1 TABLET BY MOUTH EVERY DAY     Allergies:   Patient has no known allergies.   Social History   Socioeconomic History  .  Marital status: Widowed    Spouse name: Not on file  . Number of children: 2  . Years of education: Not on file  . Highest education level: Not on file  Occupational History    Comment: Retired from [employer]  . Smoking status: Never Smoker  . Smokeless tobacco: Never Used  Vaping Use  . Vaping Use: Never used  Substance and Sexual Activity  . Alcohol use: No    Comment: stopped drinking etoh completely in 08/07/1999  . Drug use: No  . Sexual activity: Yes    Comment: uses viagra  Other Topics Concern  . Not on file  Social History Narrative   Lives alone   Retired from [employer] in August 07, 1999   Twin  sons, one son died of a stroke this year 2018/08/06) at age 74, his living son resides in [place]    His wife died of breast cancer 11 years ago    Social Determinants of Health   Financial Resource Strain: Low Risk   . Difficulty of Paying Living Expenses: Not very hard  Food Insecurity: No Food Insecurity  . Worried About Charity fundraiser in the Last Year: Never true  . Ran Out of Food in the Last Year: Never true  Transportation Needs: No Transportation Needs  . Lack of Transportation (Medical): No  . Lack of Transportation (Non-Medical): No  Physical Activity: Sufficiently Active  . Days of Exercise per Week: 7 days  . Minutes of Exercise per Session: 30 min  Stress: No Stress Concern Present  . Feeling of Stress : Only a little  Social Connections: Unknown  . Frequency of Communication with Friends and Family: More than three times a week  . Frequency of Social Gatherings with Friends and Family: Not on file  . Attends Religious Services: Not on file  . Active Member of Clubs or Organizations: Not on file  . Attends Archivist Meetings: Not on file  . Marital Status: Widowed     Family History: The patient's family history includes Diabetes in his brother and sister; Heart disease in his father and mother.  ROS:   Please see the history of present illness.    All other systems reviewed and are negative.  EKGs/Labs/Other Studies Reviewed:    The following studies were reviewed today: Prior records, EKGs  EKG:  The ekg ordered today demonstrates sinus rhythm.  No PVCs on today's rhythm strip.  April 08, 2020 EKG personally reviewed shows sinus rhythm with frequent PVCs.  PVCs have a right bundle branch block morphology in lead V1, superior axis and positive in lead I and aVL.  Recent Labs: 09/09/2019: ALT 22 04/06/2020: BUN 10; Creatinine, Ser 0.80; Potassium 4.1; Sodium 136  Recent Lipid Panel    Component Value Date/Time   CHOL 125 09/09/2019 0947     TRIG 114.0 09/09/2019 0947   TRIG 59 05/30/2006 0949   HDL 31.40 (L) 09/09/2019 0947   CHOLHDL 4 09/09/2019 0947   VLDL 22.8 09/09/2019 0947   LDLCALC 71 09/09/2019 0947    Physical Exam:    VS:  BP (!) 150/88   Pulse 77   Ht 6' (1.829 m)   Wt 223 lb (101.2 kg)   SpO2 99%   BMI 30.24 kg/m     Wt Readings from Last 3 Encounters:  05/12/20 223 lb (101.2 kg)  04/09/20 218 lb (98.9 kg)  04/08/20 219 lb (99.3 kg)     GEN:  Well  nourished, well developed in no acute distress HEENT: Normal NECK: No JVD; No carotid bruits LYMPHATICS: No lymphadenopathy CARDIAC: RRR, no murmurs, rubs, gallops RESPIRATORY:  Clear to auscultation without rales, wheezing or rhonchi  ABDOMEN: Soft, non-tender, non-distended MUSCULOSKELETAL:  No edema; No deformity  SKIN: Warm and dry NEUROLOGIC:  Alert and oriented x 3 PSYCHIATRIC:  Normal affect   ASSESSMENT:    1. PVC's (premature ventricular contractions)   2. Hypertension associated with diabetes ([place])   3. Type 2 diabetes mellitus with other specified complication, with long-term current use of insulin ([place])   4. Skin rash    PLAN:    In order of problems listed above:  1. Asymptomatic PVCs Unclear burden.  Will order a 7-day ZIO monitor to assess burden and a surface echocardiogram to assess for any signs of structural heart disease or left ventricular dysfunction.  Suspect PVCs originating from the inferior wall of the left ventricle.  Potentially posterior medial Pap muscle. If LV function is normal, we will plan on continued conservative management.  If left ventricular function is abnormal, will discuss utility of suppression with either an antiarrhythmic drug or ablation.  2.  Hypertension Above goal today. Continue monitoring blood pressures at home Continue spironolactone, ramipril, metoprolol, amlodipine  3.  Diabetes Continue insulin and metformin  4.  Skin rash on abdomen Unclear cause.  Patient has appointment with  dermatology on Thursday.   Medication Adjustments/Labs and Tests Ordered: Current medicines are reviewed at length with the patient today.  Concerns regarding medicines are outlined above.  Orders Placed This Encounter  Procedures  . LONG TERM MONITOR (3-14 DAYS)  . ECHOCARDIOGRAM COMPLETE   No orders of the defined types were placed in this encounter.    Signed, Lars Mage, MD, [facility]  05/12/2020 12:45 PM    Electrophysiology [place]

## 2020-05-12 NOTE — Progress Notes (Signed)
Patient ID: Patrick Wilcox, male   DOB: [birth year], 74 y.o.   MRN: [id number] Patient enrolled for [employer] to ship a 7 day ZIO XT long term holter monitor to his home.

## 2020-05-13 NOTE — Addendum Note (Signed)
Addended by: Campbell Riches on: 05/13/2020 01:41 PM   Modules accepted: Orders

## 2020-05-14 DIAGNOSIS — D3610 Benign neoplasm of peripheral nerves and autonomic nervous system, unspecified: Secondary | ICD-10-CM | POA: Diagnosis not present

## 2020-05-14 DIAGNOSIS — L989 Disorder of the skin and subcutaneous tissue, unspecified: Secondary | ICD-10-CM | POA: Diagnosis not present

## 2020-05-14 DIAGNOSIS — D485 Neoplasm of uncertain behavior of skin: Secondary | ICD-10-CM | POA: Diagnosis not present

## 2020-05-14 DIAGNOSIS — L309 Dermatitis, unspecified: Secondary | ICD-10-CM | POA: Diagnosis not present

## 2020-05-19 ENCOUNTER — Telehealth: Payer: Self-pay | Admitting: Family Medicine

## 2020-05-19 ENCOUNTER — Ambulatory Visit: Payer: HMO

## 2020-05-19 NOTE — Telephone Encounter (Signed)
Call 05/19/20 to r/s appt No voicemail.  Please r/s appt

## 2020-05-27 ENCOUNTER — Ambulatory Visit (INDEPENDENT_AMBULATORY_CARE_PROVIDER_SITE_OTHER): Payer: HMO

## 2020-05-27 DIAGNOSIS — I493 Ventricular premature depolarization: Secondary | ICD-10-CM | POA: Diagnosis not present

## 2020-06-01 ENCOUNTER — Other Ambulatory Visit: Payer: Self-pay

## 2020-06-01 ENCOUNTER — Other Ambulatory Visit (INDEPENDENT_AMBULATORY_CARE_PROVIDER_SITE_OTHER): Payer: HMO

## 2020-06-01 DIAGNOSIS — E1165 Type 2 diabetes mellitus with hyperglycemia: Secondary | ICD-10-CM | POA: Diagnosis not present

## 2020-06-01 DIAGNOSIS — Z794 Long term (current) use of insulin: Secondary | ICD-10-CM

## 2020-06-01 LAB — BASIC METABOLIC PANEL
BUN: 10 mg/dL (ref 6–23)
CO2: 28 mEq/L (ref 19–32)
Calcium: 9.3 mg/dL (ref 8.4–10.5)
Chloride: 101 mEq/L (ref 96–112)
Creatinine, Ser: 0.8 mg/dL (ref 0.40–1.50)
GFR: 87.11 mL/min (ref >60.00)
Glucose, Bld: 201 mg/dL — ABNORMAL HIGH (ref 70–99)
Potassium: 4.2 mEq/L (ref 3.5–5.1)
Sodium: 137 mEq/L (ref 135–145)

## 2020-06-02 LAB — FRUCTOSAMINE: Fructosamine: 305 umol/L — ABNORMAL HIGH (ref 0–285)

## 2020-06-03 ENCOUNTER — Other Ambulatory Visit: Payer: Self-pay | Admitting: Endocrinology

## 2020-06-06 ENCOUNTER — Ambulatory Visit: Payer: HMO

## 2020-06-08 ENCOUNTER — Other Ambulatory Visit: Payer: Self-pay

## 2020-06-08 ENCOUNTER — Encounter: Payer: Self-pay | Admitting: Endocrinology

## 2020-06-08 ENCOUNTER — Ambulatory Visit (INDEPENDENT_AMBULATORY_CARE_PROVIDER_SITE_OTHER): Payer: HMO | Admitting: Endocrinology

## 2020-06-08 VITALS — BP 128/68 | HR 81 | Ht 72.0 in | Wt 221.2 lb

## 2020-06-08 DIAGNOSIS — Z794 Long term (current) use of insulin: Secondary | ICD-10-CM | POA: Diagnosis not present

## 2020-06-08 DIAGNOSIS — I10 Essential (primary) hypertension: Secondary | ICD-10-CM

## 2020-06-08 DIAGNOSIS — E1165 Type 2 diabetes mellitus with hyperglycemia: Secondary | ICD-10-CM

## 2020-06-08 NOTE — Progress Notes (Signed)
Patient ID: Patrick Wilcox, male   DOB: 13-Dec-1945, 74 y.o.   MRN: [id number]    Reason for Appointment: Followup for Type 2 Diabetes   History of Present Illness:          Diagnosis: Type 2 diabetes mellitus, date of diagnosis: 2004       Past history:  At diagnosis he was having symptoms of drowsiness, difficulty breathing and blood sugar was reportedly over 600 He was initially treated with insulin but subsequently switched back to oral drugs He may have taken metformin and other medications for some time before going back on insulin His records indicate that he had been taking Amaryl and Januvia before going back on insulin in mid-2014 when his A1c had gone up over 13 Initially was given premixed insulin and then switched to Lantus and Humalog Record of his A1c indicates that his levels previously had been over 8% since 02/2012 He  had much better blood sugar control on his last visit with his changing his lifestyle and being instructed by the nurse educator  His Lantus did not appear to have 24 hour control with once a day injection  Recent history:   INSULIN regimen is described as:  40 units Tresiba daily in a.m., Novolog 20-22 units after dinner        Oral hypoglycemic drugs the patient is taking are: Metformin 1 g twice a day     His A1c is last up to 9.4 compared to 8%  His fructosamine is 305, higher  Glucose monitoring, current management and problems identified:  He was given very simplified instructions on how to take his insulin in a table format but he has not been following instructions again  He says he is now taking NovoLog either at breakfast or dinnertime and not clear why he does not take it twice a day  He says he only checks his sugars in the morning because he will adjust his diet accordingly   However he is at least trying to take his NovoLog before eating instead of 1 to 2 hours after eating  He does not think he has any low sugars lately  although occasionally has had some symptoms of weakness transiently  FASTING blood sugars are improved compared to last visit and only rarely below 100  His lab glucose was 201 likely after breakfast and without any NOVOLOG  His weight is about 3 pounds higher   He does take his Metformin twice a day as prescribed  Hypoglycemia: None   DIET: Has seen the dietitian in 2014 and nurse educator in 8/18       Meals: 2 meals per day 10 am and 6 pm.   Breakfast is  eggs;  toast, eggs, has fruit for snacks.  Evening meal usually vegetables, tuna salad, chicken.       Compliance with the medical regimen:  Fair    Glucose monitoring: Checking less than 1 times a day        Glucometer:  One Touch Verio    Blood Glucose readings  from  Download show   AVERAGE 120 vs 144  Recent blood sugar range 71-166: Previously 95-198    Exercise:  walking about a mile, 4-5/7 days   Weight history:  Wt Readings from Last 3 Encounters:  06/08/20 221 lb 3.2 oz (100.3 kg)  05/12/20 223 lb (101.2 kg)  04/09/20 218 lb (98.9 kg)    Glycemic control:   Lab Results  Component  Value Date   HGBA1C 9.4 (H) 04/06/2020   HGBA1C 8.0 (H) 12/03/2019   HGBA1C 8.2 (H) 09/09/2019   Lab Results  Component Value Date   MICROALBUR 148.4 (H) 09/09/2019   LDLCALC 71 09/09/2019   CREATININE 0.80 06/01/2020   Lab Results  Component Value Date   FRUCTOSAMINE 305 (H) 06/01/2020   FRUCTOSAMINE 296 (H) 12/31/2019   FRUCTOSAMINE 315 (H) 09/09/2019    Other active problems: See review of systems   No visits with results within 1 Week(s) from this visit.  Latest known visit with results is:  Lab on 06/01/2020  Component Date Value Ref Range Status  . Sodium 06/01/2020 137  135 - 145 mEq/L Final  . Potassium 06/01/2020 4.2  3.5 - 5.1 mEq/L Final  . Chloride 06/01/2020 101  96 - 112 mEq/L Final  . CO2 06/01/2020 28  19 - 32 mEq/L Final  . Glucose, Bld 06/01/2020 201* 70 - 99 mg/dL Final  . BUN  06/01/2020 10  6 - 23 mg/dL Final  . Creatinine, Ser 06/01/2020 0.80  0.40 - 1.50 mg/dL Final  . GFR 06/01/2020 87.11  >60.00 mL/min Final   Calculated using the CKD-EPI Creatinine Equation (2021)  . Calcium 06/01/2020 9.3  8.4 - 10.5 mg/dL Final  . Fructosamine 06/01/2020 305* 0 - 285 umol/L Final   Comment: Published reference interval for apparently healthy subjects between age 38 and 81 is 35 - 285 umol/L and in a poorly controlled diabetic population is 228 - 563 umol/L with a mean of 396 umol/L.       Allergies as of 06/08/2020   No Known Allergies     Medication List       Accurate as of June 08, 2020 10:05 AM. If you have any questions, ask your nurse or doctor.        allopurinol 300 MG tablet Commonly known as: ZYLOPRIM TAKE 1 TABLET BY MOUTH EVERY DAY   amLODipine 10 MG tablet Commonly known as: NORVASC TAKE 1 TABLET BY MOUTH EVERY DAY   aspirin 81 MG tablet Take 81 mg by mouth daily.   atorvastatin 40 MG tablet Commonly known as: LIPITOR Take 1 tablet (40 mg total) by mouth daily.   B-D SINGLE USE SWABS REGULAR Pads Use as necessary   colchicine 0.6 MG tablet TAKE 1 TABLET BY MOUTH EVERY DAY   Easy Comfort Pen Needles 31G X 5 MM Misc Generic drug: Insulin Pen Needle Used to check  Blood glucose 3 times daily or prn.   furosemide 20 MG tablet Commonly known as: LASIX TAKE 1 TABLET BY MOUTH EVERY DAY   metFORMIN 1000 MG tablet Commonly known as: GLUCOPHAGE TAKE ONE TABLET BY MOUTH TWICE DAILY. TAKE WITH A MEAL.   metoprolol tartrate 100 MG tablet Commonly known as: LOPRESSOR TAKE 1 TABLET BY MOUTH TWICE A DAY   minocycline 100 MG capsule Commonly known as: MINOCIN Take 100 mg by mouth 2 (two) times daily.   NovoLOG FlexPen 100 UNIT/ML FlexPen Generic drug: insulin aspart Inject into the skin in the morning and at bedtime. Taking 22 units total per day.   nystatin-triamcinolone ointment Commonly known as: MYCOLOG APPLY TO AFFECTED  AREA TWICE A DAY   OneTouch Delica Lancets 31D Misc Use to check blood sugar twice daily. DX Code E11.9   OneTouch Verio IQ System w/Device Kit USE TO CHECK BLOOD SUGAR TWICE A DAY DX CODE E11.9   OneTouch Verio test strip Generic drug: glucose blood USE AS INSTRUCTED  ramipril 10 MG capsule Commonly known as: ALTACE TAKE 1 CAPSULE (10 MG TOTAL) BY MOUTH 2 (TWO) TIMES DAILY. What changed: See the new instructions.   sildenafil 50 MG tablet Commonly known as: VIAGRA Take 1 tablet (50 mg total) by mouth daily as needed for erectile dysfunction.   spironolactone 25 MG tablet Commonly known as: ALDACTONE TAKE 1 TABLET BY MOUTH EVERY DAY   Tresiba FlexTouch 100 UNIT/ML FlexTouch Pen Generic drug: insulin degludec 40 units once daily       Allergies:  No Known Allergies  Past Medical History:  Diagnosis Date  . Cataract    had surgery to remove them  . CORONARY ARTERY DISEASE 11/23/2006  . DIABETES MELLITUS, TYPE II 11/23/2006  . GOUT 05/20/2008  . HYPERLIPIDEMIA 11/23/2006  . HYPERTENSION 11/23/2006  . 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 PNEUMONIA, LEFT LOWER LOBE 09/08/2009    Past Surgical History:  Procedure Laterality Date  . CORONARY ANGIOPLASTY WITH STENT PLACEMENT    . CORONARY ARTERY BYPASS GRAFT  2003    Family History  Problem Relation Age of Onset  . Heart disease Mother   . Heart disease Father   . Diabetes Sister   . Diabetes Brother     Social History:  reports that he has never smoked. He has never used smokeless tobacco. He reports that he does not drink alcohol and does not use drugs.    Review of Systems        Lipids:  LDL has been treated to goal with Lipitor 40 mg Has history of CAD, followed by PCP HDL has been low       Lab Results  Component Value Date   CHOL 125 09/09/2019   HDL 31.40 (L) 09/09/2019   LDLCALC 71 09/09/2019   TRIG 114.0 09/09/2019   CHOLHDL 4 09/09/2019       The blood pressure has been treated  with Norvasc10 mg, ramipril and metoprolol, Followed by PCP  Blood pressure is good with adding Aldactone 25 mg  He is also on Lasix from other prescribers    BP Readings from Last 3 Encounters:  06/08/20 128/68  05/12/20 (!) 150/88  04/09/20 (!) 970/26     Complications: No evidence of neuropathy or nephropathy  Diabetic foot exam was done in 11/20 by his PCP  Last exam with eye doctor  was 4/20        LABS:  No visits with results within 1 Week(s) from this visit.  Latest known visit with results is:  Lab on 06/01/2020  Component Date Value Ref Range Status  . Sodium 06/01/2020 137  135 - 145 mEq/L Final  . Potassium 06/01/2020 4.2  3.5 - 5.1 mEq/L Final  . Chloride 06/01/2020 101  96 - 112 mEq/L Final  . CO2 06/01/2020 28  19 - 32 mEq/L Final  . Glucose, Bld 06/01/2020 201* 70 - 99 mg/dL Final  . BUN 06/01/2020 10  6 - 23 mg/dL Final  . Creatinine, Ser 06/01/2020 0.80  0.40 - 1.50 mg/dL Final  . GFR 06/01/2020 87.11  >60.00 mL/min Final   Calculated using the CKD-EPI Creatinine Equation (2021)  . Calcium 06/01/2020 9.3  8.4 - 10.5 mg/dL Final  . Fructosamine 06/01/2020 305* 0 - 285 umol/L Final   Comment: Published reference interval for apparently healthy subjects between age 6 and 48 is 47 - 285 umol/L and in a poorly controlled diabetic population is 228 - 563 umol/L with a mean of 396 umol/L.  Physical Examination:  BP 128/68   Pulse 81   Ht 6' (1.829 m)   Wt 221 lb 3.2 oz (100.3 kg)   SpO2 99%   BMI 30.00 kg/m   No ankle edema present   ASSESSMENT/PLAN  Diabetes type 2, On insulin   See history of present illness for detailed discussion of current management, problems identified and blood sugar patterns.  A1c is previously high at 9.4 and fructosamine is still relatively high at 305  He is supposed to be on basal bolus insulin and Metformin  He has difficulty understanding instructions for his insulin and glucose monitoring despite  instructions and directions given in simple format in writing on each visit  Recommendations: He was again reminded that his NovoLog is only good for 1 meal and does not need to skip it if he has taken it in the morning or evening He must take the NovoLog before each meal He can likely cut back on the NovoLog to 15 units if eating a smaller meal or less carbohydrate  As a precaution we will reduce his Tresiba to 38 units once a day in the morning Again given him specific instructions and table format for checking his sugars and amounts of insulin and when to take them  Again showed him the importance of checking blood sugars after meals especially with his postprandial reading 201 after breakfast  Does not need to check fasting readings every day He can continue walking daily If he wants to have a dessert or sweet at dinnertime he can add another 5 units NovoLog He will have a follow-up with the diabetes educator to reinforce instructions  HYPERTENSION: Blood pressure is relatively better and potassium controlled with Aldactone  Follow-up in 2 months  There are no Patient Instructions on file for this visit.       [name] 06/08/2020, 10:05 AM   Note: This office note was prepared with Dragon voice recognition system technology. Any transcriptional errors that result from this process are unintentional.

## 2020-06-08 NOTE — Patient Instructions (Addendum)
38 Tresiba daily  FOR SMALL MEALS TAKE 15 NOVOLOG NOT 20  ALWAYS TAKE NOVOLOG 2X DAILY BEFORE   Check blood sugars on waking up 3 days a week  Also check blood sugars about 2 hours after meals and do this after different meals by rotation  Recommended blood sugar levels on waking up are 90-130 and about 2 hours after meal is 130-180  Please bring your blood sugar monitor to each visit, thank you

## 2020-06-09 ENCOUNTER — Ambulatory Visit (HOSPITAL_COMMUNITY): Payer: HMO | Attending: Cardiology

## 2020-06-09 DIAGNOSIS — I493 Ventricular premature depolarization: Secondary | ICD-10-CM | POA: Insufficient documentation

## 2020-06-09 LAB — ECHOCARDIOGRAM COMPLETE
AR max vel: 1.87 cm2
AV Area VTI: 1.71 cm2
AV Area mean vel: 1.8 cm2
AV Mean grad: 12 mmHg
AV Peak grad: 17.1 mmHg
Ao pk vel: 2.07 m/s
Area-P 1/2: 5.13 cm2
S' Lateral: 3.2 cm

## 2020-06-09 MED ORDER — PERFLUTREN LIPID MICROSPHERE
1.0000 mL | INTRAVENOUS | Status: AC | PRN
  Administered 2020-06-09: 2 mL via INTRAVENOUS

## 2020-06-10 ENCOUNTER — Telehealth: Payer: Self-pay | Admitting: Cardiology

## 2020-06-10 NOTE — Telephone Encounter (Signed)
Patrick Wilcox is calling stating he previously spoke with Otila Kluver today in regards to his results, but got disconnected before she was finished. He is requesting a callback to finish discussing. Please advise.

## 2020-06-10 NOTE — Telephone Encounter (Signed)
Pt contacted.

## 2020-06-11 ENCOUNTER — Telehealth: Payer: Self-pay | Admitting: Family Medicine

## 2020-06-11 NOTE — Telephone Encounter (Signed)
Patient is calling and wanted to know if provider would recommend him to get the shingles vaccine, please advise. CB is [phone]

## 2020-06-12 NOTE — Telephone Encounter (Signed)
If he has not had Shingrix, yes I recommended it. Thanks, BJ

## 2020-06-15 NOTE — Telephone Encounter (Signed)
I spoke with pt, we went over info below & he verbalized understanding.  Pt wants to know if you recommend him getting a colonoscopy at his age?

## 2020-06-15 NOTE — Telephone Encounter (Signed)
Colon cancer screening is recommended until age 74 yo.  Pts with high risk for colon cancer can sometimes be screened after 74 yo, decision is based on risk vs benefit. Screening recommendations can be discussed during his next awv/cpe. Thanks, BJ

## 2020-06-16 NOTE — Telephone Encounter (Signed)
I spoke with pt. He has an appointment with GI on 12/9, he will go to this appointment and see what they would like to do.   He is aware that he would have to get the shingles vaccine at the pharmacy due to medicare not covering it in the office.

## 2020-06-18 ENCOUNTER — Ambulatory Visit: Payer: Self-pay

## 2020-06-18 ENCOUNTER — Ambulatory Visit (INDEPENDENT_AMBULATORY_CARE_PROVIDER_SITE_OTHER): Payer: HMO | Admitting: Gastroenterology

## 2020-06-18 ENCOUNTER — Other Ambulatory Visit: Payer: Self-pay

## 2020-06-18 ENCOUNTER — Encounter: Payer: Self-pay | Admitting: Gastroenterology

## 2020-06-18 VITALS — BP 164/72 | HR 80 | Ht 72.0 in | Wt 218.0 lb

## 2020-06-18 DIAGNOSIS — I2510 Atherosclerotic heart disease of native coronary artery without angina pectoris: Secondary | ICD-10-CM

## 2020-06-18 DIAGNOSIS — Z794 Long term (current) use of insulin: Secondary | ICD-10-CM | POA: Diagnosis not present

## 2020-06-18 DIAGNOSIS — Z1211 Encounter for screening for malignant neoplasm of colon: Secondary | ICD-10-CM | POA: Diagnosis not present

## 2020-06-18 DIAGNOSIS — E1169 Type 2 diabetes mellitus with other specified complication: Secondary | ICD-10-CM

## 2020-06-18 NOTE — Progress Notes (Signed)
[facility] Consult Note:  History: Patrick Wilcox 06/18/2020  Referring provider: [name], [name], MD  Reason for consult/chief complaint: No chief complaint on file.   Subjective  HPI: Patient was referred for screening colonoscopy, previsit nurse to chart review and noted multiple medical issues including irregular heart rate with cardiology consult pending.  My chart review indicated patient's diabetes was under suboptimal control.  Patient was therefore brought for a clinic visit.  Cardiology office note from 05/12/2020 was reviewed, clinical impression was palpitations from PVCs seen on prior EKGs.  7-day monitor and echocardiogram obtained (below) No specific medical treatment recommended  Endocrinology note from 06/08/2020 reviewed, changes to diabetic treatment regimen were made.   Patrick Wilcox is feeling well, and denies exertional chest pain or dyspnea.  He gets fatigued easily.  Denies abdominal pain, altered bowel habits or rectal bleeding.  Denies heartburn dysphagia odynophagia vomiting or weight change.  Last colonoscopy for screening by Dr. Sharlett Iles 11/19/2008 had a good prep, noted to be redundant and tortuous with diverticulosis, no polyps. ROS:  Review of Systems  Constitutional: Positive for fatigue. Negative for appetite change and unexpected weight change.  HENT: Negative for mouth sores and voice change.   Eyes: Negative for pain and redness.  Respiratory: Negative for cough and shortness of breath.   Cardiovascular: Positive for palpitations. Negative for chest pain.  Genitourinary: Negative for dysuria and hematuria.  Musculoskeletal: Positive for arthralgias. Negative for myalgias.  Skin: Negative for pallor and rash.  Neurological: Negative for weakness and headaches.  Hematological: Negative for adenopathy.     Past Medical History: Past Medical History:  Diagnosis Date  . Cataract    had surgery to remove them  . CORONARY  ARTERY DISEASE 11/23/2006  . DIABETES MELLITUS, TYPE II 11/23/2006  . GOUT 05/20/2008  . HYPERLIPIDEMIA 11/23/2006  . HYPERTENSION 11/23/2006  . 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 PNEUMONIA, LEFT LOWER LOBE 09/08/2009     Past Surgical History: Past Surgical History:  Procedure Laterality Date  . CORONARY ANGIOPLASTY WITH STENT PLACEMENT    . CORONARY ARTERY BYPASS GRAFT  11-Sep-2001     Family History: Family History  Problem Relation Age of Onset  . Heart disease Mother   . Heart disease Father   . Diabetes Sister   . Diabetes Brother     Social History: Social History   Socioeconomic History  . Marital status: Widowed    Spouse name: Not on file  . Number of children: 2  . Years of education: Not on file  . Highest education level: Not on file  Occupational History    Comment: Retired from [employer]  . Smoking status: Never Smoker  . Smokeless tobacco: Never Used  Vaping Use  . Vaping Use: Never used  Substance and Sexual Activity  . Alcohol use: No    Comment: stopped drinking etoh completely in 1999-09-12  . Drug use: No  . Sexual activity: Yes    Comment: uses viagra  Other Topics Concern  . Not on file  Social History Narrative   Lives alone   Retired from [employer] in 09/12/99   Twin sons, one son died of a stroke this year Sep 11, 2018) at age 19, his living son resides in [place]    His wife died of breast cancer 11 years ago    Social Determinants of Health   Financial Resource Strain: Low Risk   . Difficulty of Paying Living Expenses: Not very hard  Food Insecurity: No Food Insecurity  . Worried About Charity fundraiser in the Last Year: Never true  . Ran Out of Food in the Last Year: Never true  Transportation Needs: No Transportation Needs  . Lack of Transportation (Medical): No  . Lack of Transportation (Non-Medical): No  Physical Activity: Sufficiently Active  . Days of Exercise per Week: 7 days  . Minutes of Exercise per Session: 30  min  Stress: No Stress Concern Present  . Feeling of Stress : Only a little  Social Connections: Unknown  . Frequency of Communication with Friends and Family: More than three times a week  . Frequency of Social Gatherings with Friends and Family: Not on file  . Attends Religious Services: Not on file  . Active Member of Clubs or Organizations: Not on file  . Attends Archivist Meetings: Not on file  . Marital Status: Widowed    Allergies: No Known Allergies  Outpatient Meds: Current Outpatient Medications  Medication Sig Dispense Refill  . Alcohol Swabs (B-D SINGLE USE SWABS REGULAR) PADS Use as necessary 300 each 6  . allopurinol (ZYLOPRIM) 300 MG tablet TAKE 1 TABLET BY MOUTH EVERY DAY 90 tablet 1  . amLODipine (NORVASC) 10 MG tablet TAKE 1 TABLET BY MOUTH EVERY DAY 90 tablet 1  . aspirin 81 MG tablet Take 81 mg by mouth daily.    Marland Kitchen atorvastatin (LIPITOR) 40 MG tablet Take 1 tablet (40 mg total) by mouth daily. 90 tablet 3  . Blood Glucose Monitoring Suppl (ONETOUCH VERIO IQ SYSTEM) w/Device KIT USE TO CHECK BLOOD SUGAR TWICE A DAY DX CODE E11.9 1 kit 2  . colchicine 0.6 MG tablet TAKE 1 TABLET BY MOUTH EVERY DAY 90 tablet 3  . EASY COMFORT PEN NEEDLES 31G X 5 MM MISC Used to check  Blood glucose 3 times daily or prn. 100 each 3  . furosemide (LASIX) 20 MG tablet TAKE 1 TABLET BY MOUTH EVERY DAY 90 tablet 1  . insulin aspart (NOVOLOG FLEXPEN) 100 UNIT/ML FlexPen Inject into the skin in the morning and at bedtime. Taking 22 units total per day.    . insulin degludec (TRESIBA FLEXTOUCH) 100 UNIT/ML FlexTouch Pen 40 units once daily 15 mL 1  . metFORMIN (GLUCOPHAGE) 1000 MG tablet TAKE ONE TABLET BY MOUTH TWICE DAILY. TAKE WITH A MEAL. 180 tablet 1  . metoprolol tartrate (LOPRESSOR) 100 MG tablet TAKE 1 TABLET BY MOUTH TWICE A DAY 180 tablet 2  . minocycline (MINOCIN) 100 MG capsule Take 100 mg by mouth 2 (two) times daily.    Marland Kitchen nystatin-triamcinolone ointment (MYCOLOG)  APPLY TO AFFECTED AREA TWICE A DAY 60 g 0  . ONETOUCH DELICA LANCETS 29J MISC Use to check blood sugar twice daily. DX Code E11.9 200 each 1  . ONETOUCH VERIO test strip USE AS INSTRUCTED 200 strip 1  . ramipril (ALTACE) 10 MG capsule TAKE 1 CAPSULE (10 MG TOTAL) BY MOUTH 2 (TWO) TIMES DAILY. (Patient taking differently: Take 10 mg by mouth daily. ) 180 capsule 1  . sildenafil (VIAGRA) 50 MG tablet Take 1 tablet (50 mg total) by mouth daily as needed for erectile dysfunction. 90 tablet 0  . spironolactone (ALDACTONE) 25 MG tablet TAKE 1 TABLET BY MOUTH EVERY DAY 90 tablet 0   No current facility-administered medications for this visit.      ___________________________________________________________________ Objective   Exam:  There were no vitals taken for this visit.   General: Well-appearing, ambulatory, gets on exam  table without  Eyes: sclera anicteric, no redness  ENT: oral mucosa moist without lesions, no cervical or supraclavicular lymphadenopathy  CV: RRR with soft systolic murmur,, no JVD, no peripheral edema  Resp: clear to auscultation bilaterally, normal RR and effort noted  GI: soft, no tenderness, with active bowel sounds. No guarding or palpable organomegaly noted.  Skin; warm and dry, no rash or jaundice noted  Neuro: awake, alert and oriented x 3. Normal gross motor function and fluent speech  Labs:  CBC Latest Ref Rng & Units 09/07/2010 09/19/2008 08/29/2007  WBC 4.5 - 10.5 K/uL 10.9(H) 9.5 9.3  Hemoglobin 13.0 - 17.0 g/dL 14.5 14.2 13.6  Hematocrit 39.0 - 52.0 % 41.8 40.2 40.3  Platelets 150.0 - 400.0 K/uL 195.0 175 210     Other:  06/09/2020 echocardiogram report:  1. Left ventricular ejection fraction, by estimation, is 60 to 65%. The  left ventricle has normal function. The left ventricle has no regional  wall motion abnormalities. There is moderate concentric left ventricular  hypertrophy. Left ventricular  diastolic parameters are consistent  with Grade I diastolic dysfunction  (impaired relaxation). Elevated left atrial pressure.   2. Right ventricular systolic function is normal. The right ventricular  size is normal.   3. Left atrial size was moderately dilated.   4. The mitral valve is normal in structure. Trivial mitral valve  regurgitation. No evidence of mitral stenosis.   5. The aortic valve is normal in structure. There is severe calcifcation  of the aortic valve. There is severe thickening of the aortic valve.  Aortic valve regurgitation is not visualized. Mild aortic valve stenosis.  Aortic valve mean gradient measures  12.0 mmHg.   6. Aortic dilatation noted. There is borderline dilatation of the  ascending aorta, measuring 40 mm.   7. The inferior vena cava is normal in size with greater than 50%  respiratory variability, suggesting right atrial pressure of 3 mmHg.  ________________  7-day monitor report:  HR 48-210, average 66bpm. 3 runs of VT, longest lasting 9 beats at a rate of 110bpm. 4.6% burden of PVCs.   Patrick Galas T. Quentin Ore, MD, [facility] Cardiac Electrophysiology   Assessment: Encounter Diagnoses  Name Primary?  . Special screening for malignant neoplasms, colon Yes  . Atherosclerosis of native coronary artery of native heart without angina pectoris   . Type 2 diabetes mellitus with other specified complication, with long-term current use of insulin (HCC)     Good functional status, palpitations with PVCs and short runs of NSVT on recent testing.  Cardiology did not feel any specific therapy was needed for that. Diabetes under suboptimal control, and my impression is that he has limited health literacy.  We discussed colon cancer screening options, and I recommended Cologuard.  He understands that if it is positive, it most likely means precancerous polyps, less likely false positive or colon cancer.  Positive test would require a colonoscopy, which I feel he could have but then the risk-benefit  ratio would be more favorable.  He was agreeable to Cologuard and appreciated the advice.  Thank you for the courtesy of this consult.  Please call me with any questions or concerns.  Nelida Meuse III  CC: Referring provider noted above

## 2020-06-18 NOTE — Patient Instructions (Signed)
If you are age 74 or older, your body mass index should be between 23-30. Your There is no height or weight on file to calculate BMI. If this is out of the aforementioned range listed, please consider follow up with your Primary Care Provider.  If you are age 17 or younger, your body mass index should be between 19-25. Your There is no height or weight on file to calculate BMI. If this is out of the aformentioned range listed, please consider follow up with your Primary Care Provider.   Your provider has ordered Cologuard testing as an option for colon cancer screening. This is performed by [employer] and may be out of network with your insurance. PRIOR to completing the test, it is YOUR responsibility to contact your insurance about covered benefits for this test. Your out of pocket expense could be anywhere from $0.00 to $649.00.   When you call to check coverage with your insurer, please provide the following information:   -The ONLY provider of Cologuard is [place] code for Cologuard is [phone].  [employer] NPI # [id number]  -[employer] Tax ID # [id number]   We have already sent your demographic and insurance information to [employer] (phone number [phone]) and they should contact you within the next week regarding your test. If you have not heard from them within the next week, please call our office at [phone].   It was a pleasure to see you today!  Dr. Loletha Carrow

## 2020-06-18 NOTE — Patient Outreach (Signed)
  [facility]) Care Management Chronic Special Needs Program    06/18/2020  Name: Patrick Wilcox, DOB: Oct 31, 1945  MRN: [id number]   [name] is enrolled in a chronic special needs plan.  Telephone call to client for CSNP assessment follow up. Unable to reach patient. HIPAA compliant voice message left with call back phone number.   PLAN:  RNCM will attempt 2nd telephone call to client in 2 weeks.   Quinn Plowman RN,BSN,CCM [facility] Management [phone]

## 2020-06-19 ENCOUNTER — Other Ambulatory Visit: Payer: Self-pay

## 2020-06-19 NOTE — Patient Outreach (Signed)
  [facility]) Care Management Chronic Special Needs Program    06/19/2020  Name: Patrick Wilcox, DOB: [birth year]  MRN: [id number]  [employer] care management team has assumed care and services for this member. Case Closed by [facility] care management.   Quinn Plowman RN,BSN,CCM [facility] Management [phone]

## 2020-06-23 ENCOUNTER — Other Ambulatory Visit: Payer: Self-pay

## 2020-06-23 ENCOUNTER — Ambulatory Visit: Payer: HMO | Admitting: Cardiology

## 2020-06-23 ENCOUNTER — Encounter: Payer: Self-pay | Admitting: Cardiology

## 2020-06-23 VITALS — BP 148/78 | HR 70 | Ht 72.0 in | Wt 222.8 lb

## 2020-06-23 DIAGNOSIS — I152 Hypertension secondary to endocrine disorders: Secondary | ICD-10-CM

## 2020-06-23 DIAGNOSIS — I493 Ventricular premature depolarization: Secondary | ICD-10-CM

## 2020-06-23 DIAGNOSIS — Z794 Long term (current) use of insulin: Secondary | ICD-10-CM

## 2020-06-23 DIAGNOSIS — E1169 Type 2 diabetes mellitus with other specified complication: Secondary | ICD-10-CM | POA: Diagnosis not present

## 2020-06-23 DIAGNOSIS — E1159 Type 2 diabetes mellitus with other circulatory complications: Secondary | ICD-10-CM | POA: Diagnosis not present

## 2020-06-23 NOTE — Patient Instructions (Signed)
Medication Instructions:  Your physician recommends that you continue on your current medications as directed. Please refer to the Current Medication list given to you today.  *If you need a refill on your cardiac medications before your next appointment, please call your pharmacy*   Lab Work: None ordered.  If you have labs (blood work) drawn today and your tests are completely normal, you will receive your results only by: . MyChart Message (if you have MyChart) OR . A paper copy in the mail If you have any lab test that is abnormal or we need to change your treatment, we will call you to review the results.   Testing/Procedures: None ordered.    Follow-Up: At [facility], you and your health needs are our priority.  As part of our continuing mission to provide you with exceptional heart care, we have created designated Provider Care Teams.  These Care Teams include your primary Cardiologist (physician) and Advanced Practice Providers (APPs -  Physician Assistants and Nurse Practitioners) who all work together to provide you with the care you need, when you need it.  We recommend signing up for the patient portal called "MyChart".  Sign up information is provided on this After Visit Summary.  MyChart is used to connect with patients for Virtual Visits (Telemedicine).  Patients are able to view lab/test results, encounter notes, upcoming appointments, etc.  Non-urgent messages can be sent to your provider as well.   To learn more about what you can do with MyChart, go to [email or web address].    Your next appointment:   6 month(s)  The format for your next appointment:   In Person  Provider:   Cameron Lambert, MD     

## 2020-06-23 NOTE — Progress Notes (Signed)
Electrophysiology Office Follow up Visit Note:    Date:  06/23/2020   ID:  Patrick Wilcox, DOB May 13, 1946, MRN [id number]  PCP:  [name], [name], MD  [place] Cardiologist:  No primary care provider on file.  [facility] Electrophysiologist:  [name], MD    Interval History:    Patrick Wilcox is a 74 y.o. male who presents for a follow up visit. They were last seen in clinic May 12, 2020 for PVCs.  Since his appointment, he tells me he has been doing well.  Is completed an echocardiogram and ZIO monitor.    Past Medical History:  Diagnosis Date  . Cataract    had surgery to remove them  . CORONARY ARTERY DISEASE 11/23/2006  . DIABETES MELLITUS, TYPE II 11/23/2006  . GOUT 05/20/2008  . HYPERLIPIDEMIA 11/23/2006  . HYPERTENSION 11/23/2006  . 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 PNEUMONIA, LEFT LOWER LOBE 09/08/2009    Past Surgical History:  Procedure Laterality Date  . CORONARY ANGIOPLASTY WITH STENT PLACEMENT    . CORONARY ARTERY BYPASS GRAFT  2003    Current Medications: Current Meds  Medication Sig  . Alcohol Swabs (B-D SINGLE USE SWABS REGULAR) PADS Use as necessary  . allopurinol (ZYLOPRIM) 300 MG tablet TAKE 1 TABLET BY MOUTH EVERY DAY  . amLODipine (NORVASC) 10 MG tablet TAKE 1 TABLET BY MOUTH EVERY DAY  . aspirin 81 MG tablet Take 81 mg by mouth daily.  Marland Kitchen atorvastatin (LIPITOR) 40 MG tablet Take 1 tablet (40 mg total) by mouth daily.  . Blood Glucose Monitoring Suppl (ONETOUCH VERIO IQ SYSTEM) w/Device KIT USE TO CHECK BLOOD SUGAR TWICE A DAY DX CODE E11.9  . colchicine 0.6 MG tablet TAKE 1 TABLET BY MOUTH EVERY DAY  . EASY COMFORT PEN NEEDLES 31G X 5 MM MISC Used to check  Blood glucose 3 times daily or prn.  . Fluocinolone Acetonide Body 0.01 % OIL Apply 1 application topically as directed.  . furosemide (LASIX) 20 MG tablet TAKE 1 TABLET BY MOUTH EVERY DAY  . insulin aspart (NOVOLOG FLEXPEN) 100 UNIT/ML FlexPen Inject into the skin in  the morning and at bedtime. Taking 22 units total per day.  . insulin degludec (TRESIBA FLEXTOUCH) 100 UNIT/ML FlexTouch Pen 40 units once daily  . metFORMIN (GLUCOPHAGE) 1000 MG tablet TAKE ONE TABLET BY MOUTH TWICE DAILY. TAKE WITH A MEAL.  . metoprolol tartrate (LOPRESSOR) 100 MG tablet TAKE 1 TABLET BY MOUTH TWICE A DAY  . minocycline (DYNACIN) 100 MG tablet Take 100 mg by mouth daily.  . minocycline (MINOCIN) 100 MG capsule Take 100 mg by mouth 2 (two) times daily.  Marland Kitchen nystatin-triamcinolone ointment (MYCOLOG) APPLY TO AFFECTED AREA TWICE A DAY  . ONETOUCH DELICA LANCETS 18A MISC Use to check blood sugar twice daily. DX Code E11.9  . ONETOUCH VERIO test strip USE AS INSTRUCTED  . ramipril (ALTACE) 10 MG capsule Take 10 mg by mouth daily.  . sildenafil (VIAGRA) 50 MG tablet Take 1 tablet (50 mg total) by mouth daily as needed for erectile dysfunction.  Marland Kitchen spironolactone (ALDACTONE) 25 MG tablet TAKE 1 TABLET BY MOUTH EVERY DAY  . [DISCONTINUED] ramipril (ALTACE) 10 MG capsule TAKE 1 CAPSULE (10 MG TOTAL) BY MOUTH 2 (TWO) TIMES DAILY. (Patient taking differently: Take 10 mg by mouth daily.)     Allergies:   Patient has no known allergies.   Social History   Socioeconomic History  . Marital status: Widowed  Spouse name: Not on file  . Number of children: 2  . Years of education: Not on file  . Highest education level: Not on file  Occupational History    Comment: Retired from [employer]  . Smoking status: Never Smoker  . Smokeless tobacco: Never Used  Vaping Use  . Vaping Use: Never used  Substance and Sexual Activity  . Alcohol use: No    Comment: stopped drinking etoh completely in Aug 25, 1999  . Drug use: No  . Sexual activity: Yes    Comment: uses viagra  Other Topics Concern  . Not on file  Social History Narrative   Lives alone   Retired from [employer] in 08-25-99   Twin sons, one son died of a stroke this year 2018/08/24) at age 65, his living son resides in  [place]    His wife died of breast cancer 11 years ago    Social Determinants of Health   Financial Resource Strain: Low Risk   . Difficulty of Paying Living Expenses: Not very hard  Food Insecurity: No Food Insecurity  . Worried About Charity fundraiser in the Last Year: Never true  . Ran Out of Food in the Last Year: Never true  Transportation Needs: No Transportation Needs  . Lack of Transportation (Medical): No  . Lack of Transportation (Non-Medical): No  Physical Activity: Sufficiently Active  . Days of Exercise per Week: 7 days  . Minutes of Exercise per Session: 30 min  Stress: No Stress Concern Present  . Feeling of Stress : Only a little  Social Connections: Unknown  . Frequency of Communication with Friends and Family: More than three times a week  . Frequency of Social Gatherings with Friends and Family: Not on file  . Attends Religious Services: Not on file  . Active Member of Clubs or Organizations: Not on file  . Attends Archivist Meetings: Not on file  . Marital Status: Widowed     Family History: The patient's family history includes Diabetes in his brother and sister; Heart disease in his father and mother.  ROS:   Please see the history of present illness.    All other systems reviewed and are negative.  EKGs/Labs/Other Studies Reviewed:    The following studies were reviewed today: Echo, ZIO  June 09, 2020 echo personally reviewed Normal left ventricular function, 60% Right ventricular function normal Calcification of the aortic valve with mild stenosis  June 16, 2020 ZIO personally reviewed 4.6% PVC burden 3 runs of VT, longest lasting 9 beats at a rate of 110 bpm Heart rate 48-2 10 average 66 bpm  EKG:  The ekg ordered today demonstrates sinus rhythm.  No PVCs.  Recent Labs: 09/09/2019: ALT 22 06/01/2020: BUN 10; Creatinine, Ser 0.80; Potassium 4.2; Sodium 137  Recent Lipid Panel    Component Value Date/Time   CHOL  125 09/09/2019 0947   TRIG 114.0 09/09/2019 0947   TRIG 59 05/30/2006 0949   HDL 31.40 (L) 09/09/2019 0947   CHOLHDL 4 09/09/2019 0947   VLDL 22.8 09/09/2019 0947   LDLCALC 71 09/09/2019 0947    Physical Exam:    VS:  BP (!) 148/78   Pulse 70   Ht 6' (1.829 m)   Wt 222 lb 12.8 oz (101.1 kg)   SpO2 98%   BMI 30.22 kg/m     Wt Readings from Last 3 Encounters:  06/23/20 222 lb 12.8 oz (101.1 kg)  06/18/20 218 lb (98.9 kg)  06/08/20 221 lb 3.2 oz (100.3 kg)     GEN:  Well nourished, well developed in no acute distress HEENT: Normal NECK: No JVD; No carotid bruits LYMPHATICS: No lymphadenopathy CARDIAC: RRR, no murmurs, rubs, gallops RESPIRATORY:  Clear to auscultation without rales, wheezing or rhonchi  ABDOMEN: Soft, non-tender, non-distended MUSCULOSKELETAL:  No edema; No deformity  SKIN: Warm and dry NEUROLOGIC:  Alert and oriented x 3 PSYCHIATRIC:  Normal affect   ASSESSMENT:    1. PVC's (premature ventricular contractions)   2. Hypertension associated with diabetes ([place])   3. Type 2 diabetes mellitus with other specified complication, with long-term current use of insulin (H[facility])    PLAN:    In order of problems listed above:  1. PVCs Overall low burden on recent ZIO monitor.  Left ventricular function is normal.  Recommend continued medical therapy.  At this point, no indication for antiarrhythmic therapy or ablation for these PVCs.  We will plan on seeing him back in 6 months.  2.  Hypertension Slightly above goal today.  Recommend continued close monitoring at home.  Continue amlodipine, metoprolol, ramipril, spironolactone.  3.  Diabetes   Medication Adjustments/Labs and Tests Ordered: Current medicines are reviewed at length with the patient today.  Concerns regarding medicines are outlined above.  Orders Placed This Encounter  Procedures  . EKG 12-Lead   No orders of the defined types were placed in this encounter.    Signed, Lars Mage,  MD, [facility]  06/23/2020 7:35 PM    Electrophysiology [place]

## 2020-06-24 ENCOUNTER — Ambulatory Visit: Payer: Self-pay

## 2020-06-24 NOTE — Progress Notes (Unsigned)
Subjective:   Patrick Wilcox is a 74 y.o. male who presents for Medicare Annual/Subsequent preventive examination.  Review of Systems    N/A        Objective:    There were no vitals filed for this visit. There is no height or weight on file to calculate BMI.  Advanced Directives 07/08/2019 02/06/2019 01/01/2016  Does Patient Have a Medical Advance Directive? Yes No No  Type of Paramedic of [place];Living will - -  Does patient want to make changes to medical advance directive? No - Patient declined - -  Would patient like information on creating a medical advance directive? - Yes (MAU/Ambulatory/Procedural Areas - Information given) Yes - Educational materials given    Current Medications (verified) Outpatient Encounter Medications as of 06/25/2020  Medication Sig  . Alcohol Swabs (B-D SINGLE USE SWABS REGULAR) PADS Use as necessary  . allopurinol (ZYLOPRIM) 300 MG tablet TAKE 1 TABLET BY MOUTH EVERY DAY  . amLODipine (NORVASC) 10 MG tablet TAKE 1 TABLET BY MOUTH EVERY DAY  . aspirin 81 MG tablet Take 81 mg by mouth daily.  Marland Kitchen atorvastatin (LIPITOR) 40 MG tablet Take 1 tablet (40 mg total) by mouth daily.  . Blood Glucose Monitoring Suppl (ONETOUCH VERIO IQ SYSTEM) w/Device KIT USE TO CHECK BLOOD SUGAR TWICE A DAY DX CODE E11.9  . colchicine 0.6 MG tablet TAKE 1 TABLET BY MOUTH EVERY DAY  . EASY COMFORT PEN NEEDLES 31G X 5 MM MISC Used to check  Blood glucose 3 times daily or prn.  . Fluocinolone Acetonide Body 0.01 % OIL Apply 1 application topically as directed.  . furosemide (LASIX) 20 MG tablet TAKE 1 TABLET BY MOUTH EVERY DAY  . insulin aspart (NOVOLOG FLEXPEN) 100 UNIT/ML FlexPen Inject into the skin in the morning and at bedtime. Taking 22 units total per day.  . insulin degludec (TRESIBA FLEXTOUCH) 100 UNIT/ML FlexTouch Pen 40 units once daily  . metFORMIN (GLUCOPHAGE) 1000 MG tablet TAKE ONE TABLET BY MOUTH TWICE DAILY. TAKE WITH A MEAL.  .  metoprolol tartrate (LOPRESSOR) 100 MG tablet TAKE 1 TABLET BY MOUTH TWICE A DAY  . minocycline (DYNACIN) 100 MG tablet Take 100 mg by mouth daily.  . minocycline (MINOCIN) 100 MG capsule Take 100 mg by mouth 2 (two) times daily.  Marland Kitchen nystatin-triamcinolone ointment (MYCOLOG) APPLY TO AFFECTED AREA TWICE A DAY  . ONETOUCH DELICA LANCETS 69S MISC Use to check blood sugar twice daily. DX Code E11.9  . ONETOUCH VERIO test strip USE AS INSTRUCTED  . ramipril (ALTACE) 10 MG capsule Take 10 mg by mouth daily.  . sildenafil (VIAGRA) 50 MG tablet Take 1 tablet (50 mg total) by mouth daily as needed for erectile dysfunction.  Marland Kitchen spironolactone (ALDACTONE) 25 MG tablet TAKE 1 TABLET BY MOUTH EVERY DAY   No facility-administered encounter medications on file as of 06/25/2020.    Allergies (verified) Patient has no known allergies.   History: Past Medical History:  Diagnosis Date  . Cataract    had surgery to remove them  . CORONARY ARTERY DISEASE 11/23/2006  . DIABETES MELLITUS, TYPE II 11/23/2006  . GOUT 05/20/2008  . HYPERLIPIDEMIA 11/23/2006  . HYPERTENSION 11/23/2006  . 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 PNEUMONIA, LEFT LOWER LOBE 09/08/2009   Past Surgical History:  Procedure Laterality Date  . CORONARY ANGIOPLASTY WITH STENT PLACEMENT    . CORONARY ARTERY BYPASS GRAFT  2003   Family History  Problem Relation Age of  Onset  . Heart disease Mother   . Heart disease Father   . Diabetes Sister   . Diabetes Brother    Social History   Socioeconomic History  . Marital status: Widowed    Spouse name: Not on file  . Number of children: 2  . Years of education: Not on file  . Highest education level: Not on file  Occupational History    Comment: Retired from [employer]  . Smoking status: Never Smoker  . Smokeless tobacco: Never Used  Vaping Use  . Vaping Use: Never used  Substance and Sexual Activity  . Alcohol use: No    Comment: stopped drinking etoh completely  in 08-28-99  . Drug use: No  . Sexual activity: Yes    Comment: uses viagra  Other Topics Concern  . Not on file  Social History Narrative   Lives alone   Retired from [employer] in 08-28-99   Twin sons, one son died of a stroke this year 08/27/2018) at age 25, his living son resides in [place]    His wife died of breast cancer 11 years ago    Social Determinants of Health   Financial Resource Strain: Low Risk   . Difficulty of Paying Living Expenses: Not very hard  Food Insecurity: No Food Insecurity  . Worried About Charity fundraiser in the Last Year: Never true  . Ran Out of Food in the Last Year: Never true  Transportation Needs: No Transportation Needs  . Lack of Transportation (Medical): No  . Lack of Transportation (Non-Medical): No  Physical Activity: Sufficiently Active  . Days of Exercise per Week: 7 days  . Minutes of Exercise per Session: 30 min  Stress: No Stress Concern Present  . Feeling of Stress : Only a little  Social Connections: Unknown  . Frequency of Communication with Friends and Family: More than three times a week  . Frequency of Social Gatherings with Friends and Family: Not on file  . Attends Religious Services: Not on file  . Active Member of Clubs or Organizations: Not on file  . Attends Archivist Meetings: Not on file  . Marital Status: Widowed    Tobacco Counseling Counseling given: Not Answered   Clinical Intake:                 Diabetic?Yes Nutrition Risk Assessment:  Has the patient had any N/V/D within the last 2 months?  {YES/NO:21197} Does the patient have any non-healing wounds?  {YES/NO:21197} Has the patient had any unintentional weight loss or weight gain?  {YES/NO:21197}  Diabetes:  Is the patient diabetic?  Yes  If diabetic, was a CBG obtained today?  No  Did the patient bring in their glucometer from home?  No  How often do you monitor your CBG's? ***.   Financial Strains and Diabetes Management:  Are  you having any financial strains with the device, your supplies or your medication? {YES/NO:21197}.  Does the patient want to be seen by Chronic Care Management for management of their diabetes?  {YES/NO:21197} Would the patient like to be referred to a Nutritionist or for Diabetic Management?  {YES/NO:21197}  Diabetic Exams:  Diabetic Eye Exam: Overdue for diabetic eye exam. Pt has been advised about the importance in completing this exam. Patient advised to call and schedule an eye exam. Diabetic Foot Exam: Overdue, Pt has been advised about the importance in completing this exam. Pt is scheduled for diabetic foot exam on 06/30/2020.  Activities of Daily Living In your present state of health, do you have any difficulty performing the following activities: 01/01/2020 07/08/2019  Hearing? N N  Vision? N N  Difficulty concentrating or making decisions? N N  Walking or climbing stairs? N N  Dressing or bathing? N N  Doing errands, shopping? N N  Preparing Food and eating ? N N  Using the Toilet? N N  In the past six months, have you accidently leaked urine? N N  Do you have problems with loss of bowel control? N N  Managing your Medications? N N  Managing your Finances? N N  Housekeeping or managing your Housekeeping? N N  Some recent data might be hidden    Patient Care Team: [name], [name], MD as PCP - General (Family Medicine) [name], MD as PCP - Electrophysiology (Cardiology) [name], MD as Consulting Physician (Endocrinology) [name], [facility] as Pharmacist (Pharmacist)  Indicate any recent Medical Services you may have received from other than [facility] providers in the past year (date may be approximate).     Assessment:   This is a routine wellness examination for Patrick Wilcox.  Hearing/Vision screen No exam data present  Dietary issues and exercise activities discussed:    Goals    . Pharmacy Care Plan     CARE PLAN ENTRY  Current  Barriers:  . Chronic Disease Management support, education, and care coordination needs related to Hypertension, Hyperlipidemia, Diabetes, and coronary atherosclerosis/ history of MI, gout   Hypertension . Pharmacist Clinical Goal(s): o Over the next 30 days, patient will work with PharmD and providers to achieve BP goal <140/90 . Current regimen:  o amlodipine 59m, 1 tablet once daily o metoprolol tartrate 1011m 1 tablet twice daily o ramipril 1068m1 capsule once daily  o furosemide 59m51m tablet once daily  o Spironolactone 25mg8mtablet once daily . Interventions: o We discussed: DASH diet:  following a diet emphasizing fruits and vegetables and low-fat dairy products along with whole grains, fish, poultry, and nuts. Reducing red meats and sugars.  o Discussed the importance of checking blood pressure at home. . Patient self care activities - Over the next 30 days, patient will: o Check BP daily and follow up visit in 4 weeks for reassessment.  o Patient to bring blood pressure monitor to cardiology appointment.  Hyperlipidemia/ coronary atherosclerosis/ history of MI Lab Results  Component Value Date/Time   LDLCALC 71 09/09/2019 09:47 AM .  Pharmacist Clinical Goal(s): o Over the next 90 days, patient will work with PharmD and providers to maintain LDL goal < 70 . Current regimen:  o atorvstatin 40mg,56mablet once daily  o aspirin 81mg, 86mblet once daily  . Interventions: o We discussed how a diet high in plant sterols (fruits/vegetables/nuts/whole grains/legumes) may reduce your cholesterol.  . Patient self care activities - Over the next 90 days, patient will: o Continue current medications.   Diabetes Hgb A1c MFr Bld  Date Value Ref Range Status  04/06/2020 9.4 (H) 4.6 - 6.5 % Final    Comment:    Glycemic Control Guidelines for People with Diabetes:Non Diabetic:  <6%Goal of Therapy: <7%Additional Action Suggested:  >8%  .  Pharmacist Clinical Goal(s): o Over  the next 30 days, patient will work with PharmD and providers to achieve A1c goal  as per Dr. Kumar  Dwyane Deerent regimen:  o insulin aspart (Novolog Flexpen), inject 10 to 12 units before breakfast and 12 units  at dinner o insulin degludec [name]), inject 40 units at bedtime  o metformin 1040m, 1 tablet twice daily  . Patient self care activities - Over the next 90 days, patient will: o Check blood sugar twice daily, document, and provide at future appointments o Contact provider with any episodes of hypoglycemia  Gout . Pharmacist Clinical Goal(s) o Over the next 90 days, patient will work with PharmD and providers to prevent gout flare-ups.  . Current regimen:  o allopurinol 3027m 1 tablet once daily  o colchicine 0.21m102m1 tablet once daily as needed . Patient self care activities o Continue current medications.   Medication management . Pharmacist Clinical Goal(s): o Over the next 90 days, patient will work with PharmD and providers to achieve optimal medication adherence . Current pharmacy: [facility]  . Interventions o Comprehensive medication review performed. o Continue current medication management strategy o Consider [facility] pharmacy for medication synchronization, packaging and delivery . Patient self care activities - Over the next 90 days, patient will: o Take medications as prescribed o Report any questions or concerns to PharmD and/or provider(s)  Please see past updates related to this goal by clicking on the "Past Updates" button in the selected goal        Depression Screen PHQ 2/9 Scores 01/01/2020 05/15/2019 02/06/2019 01/01/2018 01/01/2016 08/26/2015 04/16/2014  PHQ - 2 Score 0 0 0 0 0 0 0    Fall Risk Fall Risk  02/04/2020 05/15/2019 02/07/2019 01/01/2018 01/01/2016  Falls in the past year? 0 0 (No Data) No No  Comment Emmi Telephone Survey: data to providers prior to load - Emmi Telephone Survey: data to providers prior to load - -  Number falls in past yr:  - 0 (No Data) - -  Comment - - Emmi Telephone Survey Actual Response =  - -  Injury with Fall? - 0 - - -  Follow up - Education provided - - -    FALL RISK PREVENTION PERTAINING TO THE HOME:  Any stairs in or around the home? {YES/NO:21197} If so, are there any without handrails? No  Home free of loose throw rugs in walkways, pet beds, electrical cords, etc? Yes  Adequate lighting in your home to reduce risk of falls? Yes   ASSISTIVE DEVICES UTILIZED TO PREVENT FALLS:  Life alert? {YES/NO:21197} Use of a cane, walker or w/c? {YES/NO:21197} Grab bars in the bathroom? {YES/NO:21197} Shower chair or bench in shower? {YES/NO:21197} Elevated toilet seat or a handicapped toilet? {YES/NO:21197}  TIMED UP AND GO:  Was the test performed? {YES/NO:21197}.  Length of time to ambulate 10 feet: *** sec.   {Appearance of GaiELTR:3202334}ognitive Function:        Immunizations Immunization History  Administered Date(s) Administered  . Fluad Quad(high Dose 65+) 05/15/2019, 04/08/2020  . Influenza Split 04/13/2011, 05/31/2012  . Influenza Whole 05/21/2007, 06/22/2009, 06/15/2010  . Influenza, High Dose Seasonal PF 05/02/2013, 03/31/2016, 06/18/2018  . Influenza,inj,Quad PF,6+ Mos 04/02/2014, 03/30/2015, 06/08/2017  . PFIZER SARS-COV-2 Vaccination 09/12/2019, 10/04/2019, 06/20/2020  . Pneumococcal Conjugate-13 08/26/2015  . Pneumococcal Polysaccharide-23 05/02/2013  . Tetanus 08/02/2013  . Zoster Recombinat (Shingrix) 06/20/2020    TDAP status: Up to date  Flu Vaccine status: Up to date  Pneumococcal vaccine status: Up to date  Covid-19 vaccine status: Completed vaccines  Qualifies for Shingles Vaccine? Yes   Zostavax completed No   Shingrix Completed?: Yes  Screening Tests Health Maintenance  Topic Date Due  . OPHTHALMOLOGY EXAM  10/12/2019  . FOOT  EXAM  05/14/2020  . COLONOSCOPY  09/22/2020 (Originally 11/20/2018)  . HEMOGLOBIN A1C  10/04/2020  . COVID-19 Vaccine (4  - Booster for Pfizer series) 12/19/2020  . TETANUS/TDAP  08/03/2023  . INFLUENZA VACCINE  Completed  . Hepatitis C Screening  Completed  . PNA vac Low Risk Adult  Completed    Health Maintenance  Health Maintenance Due  Topic Date Due  . OPHTHALMOLOGY EXAM  10/12/2019  . FOOT EXAM  05/14/2020    Colorectal cancer screening: Referral to GI placed 06/25/2020. Pt aware the office will call re: appt.  Lung Cancer Screening: (Low Dose CT Chest recommended if Age 50-80 years, 30 pack-year currently smoking OR have quit w/in 15years.) does not qualify.   Lung Cancer Screening Referral: N/A   Additional Screening:  Hepatitis C Screening: does qualify; Completed 09/09/2019  Vision Screening: Recommended annual ophthalmology exams for early detection of glaucoma and other disorders of the eye. Is the patient up to date with their annual eye exam?  {YES/NO:21197} Who is the provider or what is the name of the office in which the patient attends annual eye exams? *** If pt is not established with a provider, would they like to be referred to a provider to establish care? {YES/NO:21197}.   Dental Screening: Recommended annual dental exams for proper oral hygiene  Community Resource Referral / Chronic Care Management: CRR required this visit?  No   CCM required this visit?  No      Plan:     I have personally reviewed and noted the following in the patient's chart:   . Medical and social history . Use of alcohol, tobacco or illicit drugs  . Current medications and supplements . Functional ability and status . Nutritional status . Physical activity . Advanced directives . List of other physicians . Hospitalizations, surgeries, and ER visits in previous 12 months . Vitals . Screenings to include cognitive, depression, and falls . Referrals and appointments  In addition, I have reviewed and discussed with patient certain preventive protocols, quality metrics, and best practice  recommendations. A written personalized care plan for preventive services as well as general preventive health recommendations were provided to patient.     Ofilia Neas, LPN   [id number]   Nurse Notes: None

## 2020-06-25 ENCOUNTER — Telehealth: Payer: Self-pay | Admitting: Pharmacist

## 2020-06-25 ENCOUNTER — Ambulatory Visit: Payer: HMO

## 2020-06-25 ENCOUNTER — Other Ambulatory Visit: Payer: Self-pay

## 2020-06-25 NOTE — Chronic Care Management (AMB) (Signed)
Chronic Care Management Pharmacy Assistant   Name: Patrick Wilcox  MRN: [id number] DOB: 10-31-1945  Reason for Encounter: Disease State  PCP : [name], [name], MD  Allergies:  No Known Allergies  Medications: Outpatient Encounter Medications as of 06/25/2020  Medication Sig  . Alcohol Swabs (B-D SINGLE USE SWABS REGULAR) PADS Use as necessary  . allopurinol (ZYLOPRIM) 300 MG tablet TAKE 1 TABLET BY MOUTH EVERY DAY  . amLODipine (NORVASC) 10 MG tablet TAKE 1 TABLET BY MOUTH EVERY DAY  . aspirin 81 MG tablet Take 81 mg by mouth daily.  Marland Kitchen atorvastatin (LIPITOR) 40 MG tablet Take 1 tablet (40 mg total) by mouth daily.  . Blood Glucose Monitoring Suppl (ONETOUCH VERIO IQ SYSTEM) w/Device KIT USE TO CHECK BLOOD SUGAR TWICE A DAY DX CODE E11.9  . colchicine 0.6 MG tablet TAKE 1 TABLET BY MOUTH EVERY DAY  . EASY COMFORT PEN NEEDLES 31G X 5 MM MISC Used to check  Blood glucose 3 times daily or prn.  . Fluocinolone Acetonide Body 0.01 % OIL Apply 1 application topically as directed.  . furosemide (LASIX) 20 MG tablet TAKE 1 TABLET BY MOUTH EVERY DAY  . insulin aspart (NOVOLOG FLEXPEN) 100 UNIT/ML FlexPen Inject into the skin in the morning and at bedtime. Taking 22 units total per day.  . insulin degludec (TRESIBA FLEXTOUCH) 100 UNIT/ML FlexTouch Pen 40 units once daily  . metFORMIN (GLUCOPHAGE) 1000 MG tablet TAKE ONE TABLET BY MOUTH TWICE DAILY. TAKE WITH A MEAL.  . metoprolol tartrate (LOPRESSOR) 100 MG tablet TAKE 1 TABLET BY MOUTH TWICE A DAY  . minocycline (DYNACIN) 100 MG tablet Take 100 mg by mouth daily.  . minocycline (MINOCIN) 100 MG capsule Take 100 mg by mouth 2 (two) times daily.  Marland Kitchen nystatin-triamcinolone ointment (MYCOLOG) APPLY TO AFFECTED AREA TWICE A DAY  . ONETOUCH DELICA LANCETS 85I MISC Use to check blood sugar twice daily. DX Code E11.9  . ONETOUCH VERIO test strip USE AS INSTRUCTED  . ramipril (ALTACE) 10 MG capsule Take 10 mg by mouth daily.  . sildenafil (VIAGRA)  50 MG tablet Take 1 tablet (50 mg total) by mouth daily as needed for erectile dysfunction.  Marland Kitchen spironolactone (ALDACTONE) 25 MG tablet TAKE 1 TABLET BY MOUTH EVERY DAY   No facility-administered encounter medications on file as of 06/25/2020.    Current Diagnosis: Patient Active Problem List   Diagnosis Date Noted  . Hypertension associated with diabetes ([place]) 08/26/2015  . PNEUMONIA, LEFT LOWER LOBE 09/08/2009  . GOUT 05/20/2008  . Type 2 diabetes mellitus with other specified complication ([place]) 62/70/3500  . Pure hypercholesterolemia 11/23/2006  . Essential hypertension 11/23/2006  . 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 Coronary atherosclerosis 11/23/2006    Goals Addressed   None    Reviewed chart prior to disease state call. Spoke with patient regarding BP  Recent Office Vitals: BP Readings from Last 3 Encounters:  06/23/20 (!) 148/78  06/18/20 (!) 164/72  06/08/20 128/68   Pulse Readings from Last 3 Encounters:  06/23/20 70  06/18/20 80  06/08/20 81    Wt Readings from Last 3 Encounters:  06/23/20 222 lb 12.8 oz (101.1 kg)  06/18/20 218 lb (98.9 kg)  06/08/20 221 lb 3.2 oz (100.3 kg)     Kidney Function Lab Results  Component Value Date/Time   CREATININE 0.80 06/01/2020 08:55 AM   CREATININE 0.80 04/06/2020 09:27 AM   GFR 87.11 06/01/2020 08:55 AM   GFRNONAA 121 09/19/2008  08:59 AM   GFRAA 147 09/19/2008 08:59 AM    BMP Latest Ref Rng & Units 06/01/2020 04/06/2020 12/31/2019  Glucose 70 - 99 mg/dL 201(H) 267(H) 125(H)  BUN 6 - 23 mg/dL '10 10 10  ' Creatinine 0.40 - 1.50 mg/dL 0.80 0.80 0.79  Sodium 135 - 145 mEq/L 137 136 136  Potassium 3.5 - 5.1 mEq/L 4.2 4.1 3.7  Chloride 96 - 112 mEq/L 101 101 101  CO2 19 - 32 mEq/L '28 26 28  ' Calcium 8.4 - 10.5 mg/dL 9.3 9.3 9.4    . Current antihypertensive regimen:  ? amlodipine 34m, 1 tablet once daily ? metoprolol tartrate 1041m 1 tablet twice daily ? ramipril 1062m1 capsule once daily  ? furosemide  17m69m tablet once daily  ? Spironolactone 25mg64mtablet once daily . How often are you checking your Blood Pressure? 1-2x per week . Current home BP readings: 146/74 . What recent interventions/DTPs have been made by any provider to improve Blood Pressure control since last CPP Visit: None . Any recent hospitalizations or ED visits since last visit with CPP? Yes . What diet changes have been made to improve Blood Pressure Control?  o None . What exercise is being done to improve your Blood Pressure Control?  o None  Adherence Review: Is the patient currently on ACE/ARB medication? Yes Does the patient have >5 day gap between last estimated fill dates? No   Follow-Up:  Pharmacist Review  He denies any side effects with his medication. He also denies any problems with his current pharmacy. He was recently seen at cardiology. Ramipril 10 mg was decreased to once a day. He states he still walks daily.  AmiliMaia Breslow [place] [phone]

## 2020-06-25 NOTE — Patient Outreach (Signed)
  [facility]) Care Management Chronic Special Needs Program    06/25/2020  Name: Patrick Wilcox, DOB: 03-11-1946  MRN: [id number]   [name] is enrolled in a chronic special needs plan for Diabetes. Telephone call to client for CSNP assessment follow up and Health risk assessment completion. HIPAA verified. Client states he is not at home and request call back at a later time.   PLAN; RNCM will attempt follow up call with client as requested.   Quinn Plowman RN,BSN,CCM [facility] Care Management [phone]

## 2020-06-26 ENCOUNTER — Telehealth: Payer: Self-pay

## 2020-06-26 ENCOUNTER — Other Ambulatory Visit: Payer: Self-pay

## 2020-06-26 MED ORDER — SILDENAFIL CITRATE 50 MG PO TABS: 50.0000 mg | ORAL_TABLET | Freq: Every day | ORAL | 0 refills | Status: DC | PRN

## 2020-06-26 NOTE — Addendum Note (Signed)
Addended by: Rodrigo Ran on: 06/26/2020 04:16 PM   Modules accepted: Orders

## 2020-06-26 NOTE — Telephone Encounter (Signed)
I spoke with pt. He needed a refill on his Viagra, rx sent in.

## 2020-06-26 NOTE — Telephone Encounter (Signed)
-----   Message from [name], [facility] sent at 06/26/2020  1:08 PM EST ----- Regarding: Call back Hi,  Someone on my team spoke with Mr. Klas late yesterday and he requested for a call back from you. I am unsure of what it was in regards to as he would not tell anyone what it was about.  Sorry I couldn't offer more information!  Thanks, Maddie

## 2020-06-26 NOTE — Patient Outreach (Signed)
[facility]) Care Management Chronic Special Needs Program  06/26/2020  Name: Patrick Wilcox DOB: 06-19-1946  MRN: [id number]  [name] is enrolled in a chronic special needs plan for Diabetes.  Telephone call to client for CSNP assessment follow up and Health risk assessment completion. HIPAA verified. Client states he completed the Health risk assessment with someone over the phone on yesterday 06/25/20.  Client states he is doing well. He reports regular follow up with his doctors. He reports having transportation to his appointments. Client reports taking his medications as prescribed. He states he is able to afford his medications at this time.  He reports seeing his cardiologist this week.  Denies any changes in treatment plan. Reports follow up in 6-12 months. Client states he has tried to get an appointment with his eye doctor but he has been unsuccessful. RNCM offered to call office and schedule eye appointment follow up. Client verbally agreed. Client states he missed his follow up appointment with his dietician. Client states his blood sugars range from 70-200.  He denies any hypoglycemic episodes.     RNCM called Dr. Mellissa Kohut office [facility]).  Unable to schedule client eye appointment. Office personal reported client has been dismissed from the practice.  RNCM contacted client and notified him of dismissal from practice. RNCM advised client to contact his health team advantage concierge to request of list of participating providers. Client verbalized understanding and voiced appreciation.    Goals Addressed              This Visit's Progress   .  Client states, " I want to continue to work on bringing my A1c down and keep my weight down." (pt-stated)   On track     Client reports he walks at least 60 minutes 3 times per week, monitors his carbohydrates and takes his diabetic medication as prescribed.  Reschedule appointment  with your dietician for follow up.  Continue your current home management program as stated above.       .  Client understands the importance of follow-up with providers by attending scheduled visits   On track     Primary care provider visit: 05/19/20 Endocrinology visit: 06/08/20,  Cardiology visit: 06/23/20 Continue to follow up with dermatologist as recommended.  Contact your health team advantage concierge to discuss dental coverage.    .  COMPLETED: Client will report abillity to obtain Medications within 5 months         Client states he is currently able to afford his medications.        .  Client will verbalize knowledge of self management of Hypertension as evidences by BP reading of 140/90 or less; or as defined by provider   On track     Client reports checking his blood pressure at home or at [facility].  Client reports taking his medications as prescribed.  Eat low salt, heart healthy meals full of fruits, vegetables, whole grains, lean protein, and limit fat and sugars.  Increase activity as tolerated and as directed by your physician    .  client will verbalize Rule of 15 treatment for hypoglycemic (low blood sugar) symptoms   On track     RN case manager reviewed Rule of 15 treatment for low blood sugar symptoms.  How to treat low blood sugars (Blood sugar less than 70 mg/dl  Please follow the RULE OF 15 for the treatment of hypoglycemia treatment (When your blood  sugars are less than 70 mg/ dl) STEP  1:  Take 15 grams of carbohydrates when your blood sugar is low, which includes:   3-4 glucose tabs or  3-4 oz of juice or regular soda or  One tube of glucose gel STEP 2:  Recheck blood sugar in 15 minutes STEP 3:  If your blood sugar is still low at the 15 minute recheck ---then, go back to STEP 1 and treat again with another 15 grams of carbohydrates    .  HEMOGLOBIN A1C < 7        Continue to follow diabetes self management actions:  Glucose monitoring per  provider recommendation  Check feet daily  Visit provider every 3-6 months as directed  Hbg A1C level every 3-6 months.  Eye Exam yearly  Carbohydrate controlled meal planning  Taking diabetes medication as prescribed by provider  Physical activity  Reschedule your follow up visit with your dietician    .  Maintain timely refills of diabetic medication as prescribed within the year .         Client reports maintaining timely refills of his diabetic medication. Client reports he is currently able to afford his medications.  Call your health team advantage concierge to discuss viagra coverage.     .  Obtain Annual Eye (retinal)  Exam    Not on track     Call your health team advantage concierge to request a list of participating eye doctors.  Plan to schedule your annual eye doctor appointment.     .  Obtain Annual Foot Exam   Not on track     Diabetic foot exam: 05/15/19 It is important that your doctor check your feet regularly.   Diabetes can affect the nerves in your feet, causing decreased feeling or numbness. Notify your doctor if you have these symptoms.  Diabetes foot care - Check feet daily at home (look for skin color changes, cuts, sores or cracks in the skin, swelling of feet or ankles, ingrown or fungal toenails, corn or calluses). Report these findings to your doctor - Wash feet with soap and water, dry feet well especially between toes - Moisturize your feet but not between the toes - Always wear shoes that protect your whole feet.        .  COMPLETED: Visit Primary Care Provider or Endocrinologist at least 2 times per year          Primary care provider visit: 05/19/20, 04/08/20 Endocrinology visit: 06/08/20, 04/09/20       Plan:  Send successful outreach letter with a copy of their individualized care plan and Send individual care plan to provider  Chronic care management coordinator will outreach in:  [facility] will  continue to provide services for this member through 07/10/20.  The [employer] care management team will assume care 07/11/2020.    Quinn Plowman RN,BSN,CCM Chronic Care Management Coordinator [facility] [phone]    .

## 2020-06-30 ENCOUNTER — Ambulatory Visit: Payer: HMO | Admitting: Nutrition

## 2020-07-20 DIAGNOSIS — D485 Neoplasm of uncertain behavior of skin: Secondary | ICD-10-CM | POA: Diagnosis not present

## 2020-07-20 DIAGNOSIS — L309 Dermatitis, unspecified: Secondary | ICD-10-CM | POA: Diagnosis not present

## 2020-07-23 ENCOUNTER — Other Ambulatory Visit: Payer: Self-pay | Admitting: Endocrinology

## 2020-07-23 DIAGNOSIS — E1169 Type 2 diabetes mellitus with other specified complication: Secondary | ICD-10-CM

## 2020-07-23 NOTE — Telephone Encounter (Signed)
Patient called to check on refill sent to Dr Dwyane Dee for Patrick Wilcox.  It appears that it was sent to [place] in error Please advise

## 2020-07-29 ENCOUNTER — Other Ambulatory Visit: Payer: Self-pay

## 2020-07-30 ENCOUNTER — Other Ambulatory Visit (INDEPENDENT_AMBULATORY_CARE_PROVIDER_SITE_OTHER): Payer: HMO

## 2020-07-30 DIAGNOSIS — E1165 Type 2 diabetes mellitus with hyperglycemia: Secondary | ICD-10-CM

## 2020-07-30 DIAGNOSIS — Z794 Long term (current) use of insulin: Secondary | ICD-10-CM | POA: Diagnosis not present

## 2020-07-30 LAB — BASIC METABOLIC PANEL
BUN: 11 mg/dL (ref 6–23)
CO2: 26 mEq/L (ref 19–32)
Calcium: 9.7 mg/dL (ref 8.4–10.5)
Chloride: 102 mEq/L (ref 96–112)
Creatinine, Ser: 0.9 mg/dL (ref 0.40–1.50)
GFR: 83.97 mL/min (ref >60.00)
Glucose, Bld: 104 mg/dL — ABNORMAL HIGH (ref 70–99)
Potassium: 4.2 mEq/L (ref 3.5–5.1)
Sodium: 136 mEq/L (ref 135–145)

## 2020-07-30 LAB — HEMOGLOBIN A1C: Hgb A1c MFr Bld: 7.9 % — ABNORMAL HIGH (ref 4.6–6.5)

## 2020-07-30 LAB — MICROALBUMIN / CREATININE URINE RATIO
Creatinine,U: 152.3 mg/dL
Microalb Creat Ratio: 55.6 mg/g — ABNORMAL HIGH (ref 0.0–30.0)
Microalb, Ur: 84.7 mg/dL — ABNORMAL HIGH (ref 0.0–1.9)

## 2020-08-04 ENCOUNTER — Ambulatory Visit: Payer: HMO | Admitting: Pharmacist

## 2020-08-04 DIAGNOSIS — E1159 Type 2 diabetes mellitus with other circulatory complications: Secondary | ICD-10-CM

## 2020-08-04 DIAGNOSIS — I152 Hypertension secondary to endocrine disorders: Secondary | ICD-10-CM

## 2020-08-04 DIAGNOSIS — E7800 Pure hypercholesterolemia, unspecified: Secondary | ICD-10-CM

## 2020-08-04 NOTE — Chronic Care Management (AMB) (Signed)
Chronic Care Management Pharmacy  Name: Patrick Wilcox  MRN: [id number] DOB: Jul 06, 1946  Initial Questions: 1. Have you seen any other providers since your last visit? Yes  2. Any changes in your medicines or health? No   Chief Complaint/ HPI  Patrick Wilcox,  75 y.o. , male presents for their Follow-Up CCM visit with the clinical pharmacist via telephone due to COVID-19 Pandemic.  PCP : [name], [name], MD  Their chronic conditions include: HTN, HLD/ coronary atherosclerosis/ history of MI, DM, Gout  Office Visits: 04/08/20 [name], MD: Patient presented for office visit for 6 month follow up on chronic conditions. Patient is still not checking BP at home but does sometimes as the grocery store and cannot recall readings. Noted irregular HR during office visit. Referral sent for dermatology and cardiology.  09/23/2019- Patient presented for office visit with Dr. [name], MD for follow up. For DM, patient is on Novolog 22 units after meals and Tresiba 40 units. Reported BGs: 80s-110s. A1c: 8.2 (09/09/2019). For HTN, BP at office visit: 136/70. Patient on amlodipine 98m, metoprolol tartrate 1053mBID, ramipril 1044mBID. DM: since A1c is not at goal, patient to arrange att with endocrinologist. No changes on HTN, HLD, gout regimen. Patient to continue low fat diet and low salt diet. Patient to return in 4 months for CPE.   Consult Visit: 06/23/20 CamLars MageD (cardiology): Patient presented for PCVs follow up.   06/18/20 HenWilfrid LundD (gastro): Patient presented for colon cancer screening.  06/08/20 AjaElayne SnareD: Patient presented for diabetes follow up. A1c decreased to 7.4%. No medication changes made.  05/12/20 CamLars MageD (cardiology): Patient presented for PCVs evaluation.   04/09/20 AjaElayne SnareD: Patient presented for diabetes follow up. A1C increased from 8 to 9.4%. Patient was only taking Novolog in the evening despite instructions to take twice daily.  Patient missed appt with diabetes educator and is also eating more sweets. BP was elevated in office visit.  01/02/2020- Endocrinology- AjaElayne SnareD- Patient presented for office visit for DM follow up. Patient instructed to check BGs at 7-9pm to decide evening dose of Novolog. Patient to take Novolog 10-12 units before meals based on meal size. Patient to return in 3 months.    Medications: Outpatient Encounter Medications as of 08/04/2020  Medication Sig  . Alcohol Swabs (B-D SINGLE USE SWABS REGULAR) PADS Use as necessary  . allopurinol (ZYLOPRIM) 300 MG tablet TAKE 1 TABLET BY MOUTH EVERY DAY  . amLODipine (NORVASC) 10 MG tablet TAKE 1 TABLET BY MOUTH EVERY DAY  . aspirin 81 MG tablet Take 81 mg by mouth daily.  . aMarland Kitchenorvastatin (LIPITOR) 40 MG tablet Take 1 tablet (40 mg total) by mouth daily.  . Blood Glucose Monitoring Suppl (ONETOUCH VERIO IQ SYSTEM) w/Device KIT USE TO CHECK BLOOD SUGAR TWICE A DAY DX CODE E11.9  . colchicine 0.6 MG tablet TAKE 1 TABLET BY MOUTH EVERY DAY  . EASY COMFORT PEN NEEDLES 31G X 5 MM MISC Used to check  Blood glucose 3 times daily or prn.  . Fluocinolone Acetonide Body 0.01 % OIL Apply 1 application topically as directed. (Patient not taking: Reported on 08/05/2020)  . furosemide (LASIX) 20 MG tablet TAKE 1 TABLET BY MOUTH EVERY DAY  . insulin aspart (NOVOLOG FLEXPEN) 100 UNIT/ML FlexPen Inject into the skin in the morning and at bedtime. Taking 22 units total per day.  . insulin degludec (TRESIBA FLEXTOUCH) 100 UNIT/ML FlexTouch Pen INJECT 38 UNITS SUBCUTANEOUSLY  ONCE DAILY  . metFORMIN (GLUCOPHAGE) 1000 MG tablet TAKE ONE TABLET BY MOUTH TWICE DAILY. TAKE WITH A MEAL.  . metoprolol tartrate (LOPRESSOR) 100 MG tablet TAKE 1 TABLET BY MOUTH TWICE A DAY  . minocycline (DYNACIN) 100 MG tablet Take 100 mg by mouth daily. (Patient not taking: Reported on 08/05/2020)  . minocycline (MINOCIN) 100 MG capsule Take 100 mg by mouth 2 (two) times daily. (Patient not  taking: Reported on 08/05/2020)  . nystatin-triamcinolone ointment (MYCOLOG) APPLY TO AFFECTED AREA TWICE A DAY (Patient not taking: Reported on 08/05/2020)  . ONETOUCH DELICA LANCETS 03K MISC Use to check blood sugar twice daily. DX Code E11.9  . ONETOUCH VERIO test strip USE AS INSTRUCTED  . ramipril (ALTACE) 10 MG capsule Take 10 mg by mouth daily.  . sildenafil (VIAGRA) 50 MG tablet Take 1 tablet (50 mg total) by mouth daily as needed for erectile dysfunction.  Marland Kitchen spironolactone (ALDACTONE) 25 MG tablet TAKE 1 TABLET BY MOUTH EVERY DAY   No facility-administered encounter medications on file as of 08/04/2020.   Patient reports his biggest concern is that he doesn't have a follow up with Dr. [name]. Scheduled a follow up while on the phone with him. Patient also requested a referral for ophthalmology as his previous one dismissed him from the practice.  Patient is also concerned as he requested medication for ED and never received from the pharmacy. Followed up with the pharmacy who received the prescription but didn't fill due to cost and not covered by patient's insurance.  Current Diagnosis/Assessment:  Goals Addressed            This Visit's Progress   . Pharmacy Care Plan       CARE PLAN ENTRY  Current Barriers:  . Chronic Disease Management support, education, and care coordination needs related to Hypertension, Hyperlipidemia, Diabetes, and coronary atherosclerosis/ history of MI, gout   Hypertension . Pharmacist Clinical Goal(s): o Over the next 90 days, patient will work with PharmD and providers to achieve BP goal <140/90 . Current regimen:  o amlodipine 62m, 1 tablet once daily o metoprolol tartrate 1059m 1 tablet twice daily o ramipril 1060m1 capsule once daily  o furosemide 62m61m tablet once daily  o Spironolactone 25mg45mtablet once daily . Interventions: o We discussed: DASH diet:  following a diet emphasizing fruits and vegetables and low-fat dairy products  along with whole grains, fish, poultry, and nuts. Reducing red meats and sugars.  o Discussed the importance of checking blood pressure at home. . Patient self care activities - Over the next 90 days, patient will: o Check BP daily and follow up visit in 1 month for reassessment.  o Patient to bring blood pressure monitor to tomorrow's office visit  Hyperlipidemia/ coronary atherosclerosis/ history of MI Lab Results  Component Value Date/Time   LDLCALC 71 09/09/2019 09:47 AM .  Pharmacist Clinical Goal(s): o Over the next 90 days, patient will work with PharmD and providers to maintain LDL goal < 70 . Current regimen:  o atorvstatin 40mg,35mablet once daily  o aspirin 81mg, 59mblet once daily  . Interventions: o We discussed how a diet high in plant sterols (fruits/vegetables/nuts/whole grains/legumes) may reduce your cholesterol.  . Patient self care activities - Over the next 90 days, patient will: o Continue current medications.   Diabetes Hgb A1c MFr Bld  Date Value Ref Range Status  04/06/2020 9.4 (H) 4.6 - 6.5 % Final    Comment:  Glycemic Control Guidelines for People with Diabetes:Non Diabetic:  <6%Goal of Therapy: <7%Additional Action Suggested:  >8%  .  Pharmacist Clinical Goal(s): o Over the next 90 days, patient will work with PharmD and providers to achieve A1c goal  as per Dr. Dwyane Dee  . Current regimen:  o insulin aspart (Novolog Flexpen), inject 10 to 12 units before breakfast and 12 units at dinner o insulin degludec [name]), inject 40 units at bedtime  o metformin 10638m, 1 tablet twice daily  . Patient self care activities - Over the next 90 days, patient will: o Check blood sugar twice daily, document, and provide at future appointments o Contact provider with any episodes of hypoglycemia o Treat low blood sugars and then followed by a protein/meal to avoid sudden drops   Gout . Pharmacist Clinical Goal(s) o Over the next 90 days, patient will  work with PharmD and providers to prevent gout flare-ups.  . Current regimen:  o allopurinol 3030m 1 tablet once daily  o colchicine 0.38m438m1 tablet once daily as needed . Patient self care activities o Continue current medications.   Medication management . Pharmacist Clinical Goal(s): o Over the next 90 days, patient will work with PharmD and providers to achieve optimal medication adherence . [facility]  . Interventions o Comprehensive medication review performed. o Continue current medication management strategy o Consider [facility] pharmacy for medication synchronization, packaging and delivery . Patient self care activities - Over the next 90 days, patient will: o Take medications as prescribed o Report any questions or concerns to PharmD and/or provider(s)  Please see past updates related to this goal by clicking on the "Past Updates" button in the selected goal           Diabetes  Patient reported being diagnosed in 2003.  Managed by Dr. KumDwyane Dee Recent Relevant Labs: Lab Results  Component Value Date/Time   HGBA1C 7.9 (H) 07/30/2020 08:31 AM   HGBA1C 9.4 (H) 04/06/2020 09:27 AM   MICROALBUR 84.7 (H) 07/30/2020 08:31 AM   MICROALBUR 148.4 (H) 09/09/2019 09:47 AM    Checking BG: 2x per Day  Before meals: 110, 80 -Last week 67 (low) - first time in months  Patient is currently uncontrolled (based on blood sugars) on the following medications:   insulin aspart (Novolog Flexpen), inject 10-12 units at breakfast if needed and 12 units at suppertime   insulin degludec (TrTyler Aasextouch), inject 40 units at suppertime (changing times)   metformin 1000m35m tablet twice daily   Last diabetic Eye exam:  Lab Results  Component Value Date/Time   HMDIABEYEEXA No Retinopathy 10/09/2017 12:00 AM  - patient is overdue for an eye exam: requested referral   Last diabetic Foot exam:  Lab Results  Component Value Date/Time   HMDIABFOOTEX done 08/22/2011  12:00 AM     We discussed: how to recognize and treat signs of hypoglycemia (denies symptoms) -Recommended protein after treating a low blood sugar -Diet: patient is drinking 2 x 7 ounce sodas per day - recommended to swap out one for water -Exercise: patient reports he is walking 2-3 times a week and is going to walk today   Plan Managed by endo (Dr. KumaDwyane DeeContinue current medications   Hypertension   Denies dizziness/ lightheadedness. Denies HA/vision changes.   Office blood pressures are:  BP Readings from Last 3 Encounters:  08/05/20 138/78  06/23/20 (!) 148/78  06/18/20 (!) 164/72   Patient checks BP at home currently not  checking; will bring BP cuff to cardiology appointment  Patient home BP readings are ranging: currently not checking  Patient is better controlled (based on last visit BP) on:   amlodipine 89m, 1 tablet once daily  metoprolol tartrate 1052m 1 tablet twice daily  ramipril 1064m1 capsule once daily   furosemide 24m24m tablet once daily   sprironolactone  25mg28mtablet once daily   We discussed:  - importance of adherence of blood pressure medications and avoid discontinuing medications without consulting with providers first.  -the importance of checking blood pressure at home -Patient will bring BP cuff to appt tomorrow  Plan Denies headaches or chest pain Continue current medications   Hyperlipidemia/ Coronary atherosclerosis/ Hx of MI  LDL goal < 70  Lipid Panel     Component Value Date/Time   CHOL 125 09/09/2019 0947   TRIG 114.0 09/09/2019 0947   TRIG 59 05/30/2006 0949   HDL 31.40 (L) 09/09/2019 0947   CHOLHDL 4 09/09/2019 0947   VLDL 22.8 09/09/2019 0947   LDLCAThree Rocks3/07/2019 0947    The ASCVD Risk score (Goff DC Jr., et al., 2013) failed to calculate for the following reasons:   The patient has a prior MI or stroke diagnosis   Patient is currently controlled on the following medications:  - atorvstatin 40mg,78mtablet once daily - aspirin 81mg, 85mblet once daily   We discussed:  diet and exercise extensively   . We discussed how a diet high in plant sterols (fruits/vegetables/nuts/whole grains/legumes) may reduce your cholesterol.   Plan Continue current medications  Gout   Patient reported last flare-up being over a year ago.   Patient is currently controlled on the following medications:   allopurinol 300mg, 149mlet once daily   colchicine 0.6mg, 1 t2met once daily as needed  Uric acid: 4.4 (09/09/2019)   Plan Continue current medications  Medication Management   [facility] Uses pill box? Yes - patient recently bought one and reports it is helping; still occasionally forgets to take evening dose of metoprolol  recommended using a timer Pt endorses 90% compliance  We discussed: Discussed benefits of medication synchronization, packaging and delivery as well as enhanced pharmacist oversight with [facility].; patient is not satisfied with current pharmacy as they always say things are ready when they aren't but he is currently not paying anything for medicines  Plan  Continue current medication management strategy - will plan to discuss [facility] to determine coverage   Consider switch of metoprolol tartrate to metoprolol succinate to improve medication adherence.    Follow up: 3 month phone visit with CPP 1 month BP assessment with CPA   Harrison Zetina Jeni SallesClinical Pharmacist  [phone]

## 2020-08-05 ENCOUNTER — Ambulatory Visit (INDEPENDENT_AMBULATORY_CARE_PROVIDER_SITE_OTHER): Payer: HMO | Admitting: Endocrinology

## 2020-08-05 ENCOUNTER — Other Ambulatory Visit: Payer: Self-pay

## 2020-08-05 ENCOUNTER — Encounter: Payer: Self-pay | Admitting: Endocrinology

## 2020-08-05 VITALS — BP 138/78 | HR 93 | Ht 72.0 in | Wt 219.8 lb

## 2020-08-05 DIAGNOSIS — E063 Autoimmune thyroiditis: Secondary | ICD-10-CM

## 2020-08-05 DIAGNOSIS — Z794 Long term (current) use of insulin: Secondary | ICD-10-CM | POA: Diagnosis not present

## 2020-08-05 DIAGNOSIS — R809 Proteinuria, unspecified: Secondary | ICD-10-CM | POA: Diagnosis not present

## 2020-08-05 DIAGNOSIS — E1165 Type 2 diabetes mellitus with hyperglycemia: Secondary | ICD-10-CM

## 2020-08-05 DIAGNOSIS — E782 Mixed hyperlipidemia: Secondary | ICD-10-CM

## 2020-08-05 DIAGNOSIS — E1129 Type 2 diabetes mellitus with other diabetic kidney complication: Secondary | ICD-10-CM

## 2020-08-05 NOTE — Progress Notes (Signed)
Patient ID: Patrick Wilcox, male   DOB: [birth year], 75 y.o.   MRN: [id number]    Reason for Appointment: Followup for Type 2 Diabetes   History of Present Illness:          Diagnosis: Type 2 diabetes mellitus, date of diagnosis: 2004       Past history:  At diagnosis he was having symptoms of drowsiness, difficulty breathing and blood sugar was reportedly over 600 He was initially treated with insulin but subsequently switched back to oral drugs He may have taken metformin and other medications for some time before going back on insulin His records indicate that he had been taking Amaryl and Januvia before going back on insulin in mid-2014 when his A1c had gone up over 13 Initially was given premixed insulin and then switched to Lantus and Humalog Record of his A1c indicates that his levels previously had been over 8% since 02/2012 He  had much better blood sugar control on his last visit with his changing his lifestyle and being instructed by the nurse educator  His Lantus did not appear to have 24 hour control with once a day injection  Recent history:   INSULIN regimen is described as:  38 units Tresiba daily in pm., Novolog 20-22 units before breakfast and dinner        Oral hypoglycemic drugs the patient is taking are: Metformin 1 g twice a day     His A1c is 7.9, previously was up to 9.4   Glucose monitoring, current management and problems identified:  He appears to be doing better with the timing of his insulin doses especially NovoLog although still unclear how often he is compliant with the regimen  [name] was reduced by 2 units on the last visit and he thinks he has cut down the dose  He says he is now taking [name] late evening instead of in the morning as he was doing before  Is likely missing the dose every now and then because he has had at least one blood sugar over 200 in the morning  However lowest blood sugar was 67, asymptomatic possibly  secondary to decreased intake the night before  Not adjusting NovoLog based on what he is eating  No hypoglycemic symptoms reported at any time  He has not been doing much walking because of cold weather  Previously was not taking NovoLog consistently especially in the morning  Taking Metformin twice a day as prescribed  DIET: Has seen the dietitian in 2014 and nurse educator in 8/18       Meals: 2 meals per day 10 am and 6 pm.   Breakfast is  eggs;  toast, eggs, has fruit for snacks.  Evening meal usually vegetables, tuna salad, chicken.       Compliance with the medical regimen:  Fair    Glucose monitoring: Checking less than 1 times a day        Glucometer:  One Touch Verio    Blood Glucose readings  from meter download  FASTING range 67-256, AVERAGE 143 Midday average 120 Afternoon 145 OVERALL average 127   Exercise:  walking about a mile, 4-5/7 days   Weight history:  Wt Readings from Last 3 Encounters:  08/05/20 219 lb 12.8 oz (99.7 kg)  06/23/20 222 lb 12.8 oz (101.1 kg)  06/18/20 218 lb (98.9 kg)    Glycemic control:   Lab Results  Component Value Date   HGBA1C 7.9 (H) 07/30/2020  HGBA1C 9.4 (H) 04/06/2020   HGBA1C 8.0 (H) 12/03/2019   Lab Results  Component Value Date   MICROALBUR 84.7 (H) 07/30/2020   LDLCALC 71 09/09/2019   CREATININE 0.90 07/30/2020   Lab Results  Component Value Date   FRUCTOSAMINE 305 (H) 06/01/2020   FRUCTOSAMINE 296 (H) 12/31/2019   FRUCTOSAMINE 315 (H) 09/09/2019    Other active problems: See review of systems   Lab on 07/30/2020  Component Date Value Ref Range Status  . Microalb, Ur 07/30/2020 84.7* 0.0 - 1.9 mg/dL Final  . Creatinine,U 07/30/2020 152.3  mg/dL Final  . Microalb Creat Ratio 07/30/2020 55.6* 0.0 - 30.0 mg/g Final  . Sodium 07/30/2020 136  135 - 145 mEq/L Final  . Potassium 07/30/2020 4.2  3.5 - 5.1 mEq/L Final  . Chloride 07/30/2020 102  96 - 112 mEq/L Final  . CO2 07/30/2020 26  19 - 32 mEq/L  Final  . Glucose, Bld 07/30/2020 104* 70 - 99 mg/dL Final  . BUN 07/30/2020 11  6 - 23 mg/dL Final  . Creatinine, Ser 07/30/2020 0.90  0.40 - 1.50 mg/dL Final  . GFR 07/30/2020 83.97  >60.00 mL/min Final   Calculated using the CKD-EPI Creatinine Equation (2021)  . Calcium 07/30/2020 9.7  8.4 - 10.5 mg/dL Final  . Hgb A1c MFr Bld 07/30/2020 7.9* 4.6 - 6.5 % Final   Glycemic Control Guidelines for People with Diabetes:Non Diabetic:  <6%Goal of Therapy: <7%Additional Action Suggested:  >8%       Allergies as of 08/05/2020   No Known Allergies     Medication List       Accurate as of August 05, 2020  9:09 AM. If you have any questions, ask your nurse or doctor.        allopurinol 300 MG tablet Commonly known as: ZYLOPRIM TAKE 1 TABLET BY MOUTH EVERY DAY   amLODipine 10 MG tablet Commonly known as: NORVASC TAKE 1 TABLET BY MOUTH EVERY DAY   aspirin 81 MG tablet Take 81 mg by mouth daily.   atorvastatin 40 MG tablet Commonly known as: LIPITOR Take 1 tablet (40 mg total) by mouth daily.   B-D SINGLE USE SWABS REGULAR Pads Use as necessary   colchicine 0.6 MG tablet TAKE 1 TABLET BY MOUTH EVERY DAY   Easy Comfort Pen Needles 31G X 5 MM Misc Generic drug: Insulin Pen Needle Used to check  Blood glucose 3 times daily or prn.   Fluocinolone Acetonide Body 0.01 % Oil Apply 1 application topically as directed.   furosemide 20 MG tablet Commonly known as: LASIX TAKE 1 TABLET BY MOUTH EVERY DAY   metFORMIN 1000 MG tablet Commonly known as: GLUCOPHAGE TAKE ONE TABLET BY MOUTH TWICE DAILY. TAKE WITH A MEAL.   metoprolol tartrate 100 MG tablet Commonly known as: LOPRESSOR TAKE 1 TABLET BY MOUTH TWICE A DAY   minocycline 100 MG capsule Commonly known as: MINOCIN Take 100 mg by mouth 2 (two) times daily.   minocycline 100 MG tablet Commonly known as: DYNACIN Take 100 mg by mouth daily.   NovoLOG FlexPen 100 UNIT/ML FlexPen Generic drug: insulin aspart Inject into  the skin in the morning and at bedtime. Taking 22 units total per day.   nystatin-triamcinolone ointment Commonly known as: MYCOLOG APPLY TO AFFECTED AREA TWICE A DAY   OneTouch Delica Lancets 37T Misc Use to check blood sugar twice daily. DX Code E11.9   OneTouch Verio IQ System w/Device Kit USE TO CHECK BLOOD SUGAR TWICE  A DAY DX CODE E11.9   OneTouch Verio test strip Generic drug: glucose blood USE AS INSTRUCTED   ramipril 10 MG capsule Commonly known as: ALTACE Take 10 mg by mouth daily.   sildenafil 50 MG tablet Commonly known as: VIAGRA Take 1 tablet (50 mg total) by mouth daily as needed for erectile dysfunction.   spironolactone 25 MG tablet Commonly known as: ALDACTONE TAKE 1 TABLET BY MOUTH EVERY DAY   Tresiba FlexTouch 100 UNIT/ML FlexTouch Pen Generic drug: insulin degludec INJECT 38 UNITS SUBCUTANEOUSLY ONCE DAILY       Allergies:  No Known Allergies  Past Medical History:  Diagnosis Date  . Cataract    had surgery to remove them  . CORONARY ARTERY DISEASE 11/23/2006  . DIABETES MELLITUS, TYPE II 11/23/2006  . GOUT 05/20/2008  . HYPERLIPIDEMIA 11/23/2006  . HYPERTENSION 11/23/2006  . 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 PNEUMONIA, LEFT LOWER LOBE 09/08/2009  . Rash     Past Surgical History:  Procedure Laterality Date  . CORONARY ANGIOPLASTY WITH STENT PLACEMENT    . CORONARY ARTERY BYPASS GRAFT  2003    Family History  Problem Relation Age of Onset  . Heart disease Mother   . Heart disease Father   . Diabetes Sister   . Diabetes Brother     Social History:  reports that he has never smoked. He has never used smokeless tobacco. He reports that he does not drink alcohol and does not use drugs.    Review of Systems        Lipids:  LDL has been treated to goal with Lipitor 40 mg Has history of CAD, followed by PCP HDL has been low       Lab Results  Component Value Date   CHOL 125 09/09/2019   HDL 31.40 (L) 09/09/2019    LDLCALC 71 09/09/2019   TRIG 114.0 09/09/2019   CHOLHDL 4 09/09/2019       The blood pressure has been treated with Norvasc10 mg, ramipril 10 mg and metoprolol, Followed by PCP  Blood pressure is better with adding Aldactone 25 mg  He is also on Lasix from PCP     BP Readings from Last 3 Encounters:  08/05/20 138/78  06/23/20 (!) 148/78  06/18/20 (!) 164/72   Lab Results  Component Value Date   K 4.2 07/30/2020   Microalbumin is improving  Lab Results  Component Value Date   MICRALBCREAT 55.6 (H) 07/30/2020   MICRALBCREAT 98.1 (H) 09/09/2019   MICRALBCREAT 13.1 03/16/2018   MICRALBCREAT 21.1 53/61/4431     Complications: Microalbuminuria    Diabetic foot exam was done in 11/20 by his PCP  Last exam with eye doctor  was 4/20, has not been able to make appointment for follow-up        LABS:  Lab on 07/30/2020  Component Date Value Ref Range Status  . Microalb, Ur 07/30/2020 84.7* 0.0 - 1.9 mg/dL Final  . Creatinine,U 07/30/2020 152.3  mg/dL Final  . Microalb Creat Ratio 07/30/2020 55.6* 0.0 - 30.0 mg/g Final  . Sodium 07/30/2020 136  135 - 145 mEq/L Final  . Potassium 07/30/2020 4.2  3.5 - 5.1 mEq/L Final  . Chloride 07/30/2020 102  96 - 112 mEq/L Final  . CO2 07/30/2020 26  19 - 32 mEq/L Final  . Glucose, Bld 07/30/2020 104* 70 - 99 mg/dL Final  . BUN 07/30/2020 11  6 - 23 mg/dL Final  . Creatinine, Ser 07/30/2020 0.90  0.40 - 1.50 mg/dL Final  . GFR 07/30/2020 83.97  >60.00 mL/min Final   Calculated using the CKD-EPI Creatinine Equation (2021)  . Calcium 07/30/2020 9.7  8.4 - 10.5 mg/dL Final  . Hgb A1c MFr Bld 07/30/2020 7.9* 4.6 - 6.5 % Final   Glycemic Control Guidelines for People with Diabetes:Non Diabetic:  <6%Goal of Therapy: <7%Additional Action Suggested:  >8%     Physical Examination:  BP 138/78   Pulse 93   Ht 6' (1.829 m)   Wt 219 lb 12.8 oz (99.7 kg)   SpO2 98%   BMI 29.81 kg/m      ASSESSMENT/PLAN  Diabetes type 2, On insulin    See history of present illness for detailed discussion of current management, problems identified and blood sugar patterns.  A1c is 7.9; previously high at 9.4   He has been on basal bolus insulin and Metformin   Although he thinks he is following instructions for both mealtime and basal insulin blood sugars are sometimes inconsistent  Again reminded him that he needs to take [name] consistently at the same time daily  Also needs to take NovoLog twice a day before each meal  He will likely need to adjust his NovoLog for meal size especially to reduce the dose in the evening if eating a smaller meal  Also discussed that NovoLog cannot be adjusted unless he is doing readings after meals  Also his morning sugars continue to be trending low normal around 70 he will cut back the [name] to 36 units  Emphasized the need to check blood sugars after meals regularly and he can at least do a monitoring test when he is taking his evening [name] Consider follow-up with diabetes educator referral again, he does not make an appointment when referrals are made  HYPERTENSION: Blood pressure is fairly well controlled Microalbumin is improving but not back to normal yet  Follow-up in 3 months  There are no Patient Instructions on file for this visit.       [name] 08/05/2020, 9:09 AM   Note: This office note was prepared with Dragon voice recognition system technology. Any transcriptional errors that result from this process are unintentional.

## 2020-08-05 NOTE — Patient Instructions (Addendum)
[phone]  Check sugar when taking [name] . Check blood sugars on waking up 4-5 days a week  Also check blood sugars about 2 hours after meals and do this after different meals by rotation  Recommended blood sugar levels on waking up are 90-130 and about 2 hours after meal is 130-160  Please bring your blood sugar monitor to each visit, thank you

## 2020-08-11 ENCOUNTER — Other Ambulatory Visit: Payer: Self-pay | Admitting: Family Medicine

## 2020-08-12 ENCOUNTER — Telehealth: Payer: Self-pay

## 2020-08-12 MED ORDER — SILDENAFIL CITRATE 50 MG PO TABS: 50.0000 mg | ORAL_TABLET | Freq: Every day | ORAL | 0 refills | Status: DC | PRN

## 2020-08-12 NOTE — Telephone Encounter (Signed)
-----   Message from [name], [facility] sent at 08/12/2020  3:19 PM EST ----- Regarding: Refill Hi,  Can you please send his viagra to [facility] to see if it'll be any cheaper? The [facility] to him is in [facility] on [address]. The [facility] told him it'll be $1500 for a 90 day supply.  Thanks, Maddie

## 2020-08-17 ENCOUNTER — Other Ambulatory Visit: Payer: Self-pay

## 2020-08-17 ENCOUNTER — Ambulatory Visit (INDEPENDENT_AMBULATORY_CARE_PROVIDER_SITE_OTHER): Payer: HMO | Admitting: Family Medicine

## 2020-08-17 ENCOUNTER — Encounter: Payer: Self-pay | Admitting: Family Medicine

## 2020-08-17 VITALS — BP 118/80 | HR 100 | Temp 98.2°F | Resp 16 | Ht 72.0 in | Wt 221.0 lb

## 2020-08-17 DIAGNOSIS — E7800 Pure hypercholesterolemia, unspecified: Secondary | ICD-10-CM

## 2020-08-17 DIAGNOSIS — I10 Essential (primary) hypertension: Secondary | ICD-10-CM | POA: Diagnosis not present

## 2020-08-17 DIAGNOSIS — D3610 Benign neoplasm of peripheral nerves and autonomic nervous system, unspecified: Secondary | ICD-10-CM | POA: Diagnosis not present

## 2020-08-17 DIAGNOSIS — M1A9XX0 Chronic gout, unspecified, without tophus (tophi): Secondary | ICD-10-CM | POA: Diagnosis not present

## 2020-08-17 DIAGNOSIS — L819 Disorder of pigmentation, unspecified: Secondary | ICD-10-CM | POA: Diagnosis not present

## 2020-08-17 MED ORDER — NYSTATIN-TRIAMCINOLONE 100000-0.1 UNIT/GM-% EX OINT: TOPICAL_OINTMENT | CUTANEOUS | 0 refills | Status: DC

## 2020-08-17 MED ORDER — COLCHICINE 0.6 MG PO TABS: 0.6000 mg | ORAL_TABLET | Freq: Every day | ORAL | 3 refills | Status: DC | PRN

## 2020-08-17 NOTE — Progress Notes (Signed)
HPI: [name] is a 75 y.o. male, who is here today for 6 months follow up.   He was last seen on 04/08/20. Since his last visit he has seen his endocrinologist. He has also seen cardiologist, Dx'ed with PVC's.  HTN: He is on Spironolactone 25 mg daily, Ramipril 10 mg daily,and Metoprolol tartrate 100 mg bid.  Negative for severe/frequent headache, visual changes, chest pain, dyspnea, palpitation, claudication, focal weakness, or unusual edema. He is on Furosemide 20 mg daily.  Lab Results  Component Value Date   CREATININE 0.90 07/30/2020   BUN 11 07/30/2020   NA 136 07/30/2020   K 4.2 07/30/2020   CL 102 07/30/2020   CO2 26 07/30/2020   HLD: he is on Atorvastatin 40 mg daily.  Lab Results  Component Value Date   CHOL 125 09/09/2019   HDL 31.40 (L) 09/09/2019   LDLCALC 71 09/09/2019   TRIG 114.0 09/09/2019   CHOLHDL 4 09/09/2019   He has not had a gout attack in over a year. He is on Allopurinol 300 mg daily and Colchicine 0.6 mg daily.  Lab Results  Component Value Date   LABURIC 4.4 09/09/2019   He also saw dermatologist, according to pt, Bx done, and prescribed topical steroid, which did not help.He was reassured, he was not feel satisfied. He would like a refill for Mycolog cream. Lesions are stable, he has not noted new ones.  Review of Systems  Constitutional: Negative for activity change, appetite change, fatigue and fever.  HENT: Negative for nosebleeds and sore throat.   Respiratory: Negative for cough and wheezing.   Gastrointestinal: Negative for abdominal pain, nausea and vomiting.  Genitourinary: Negative for decreased urine volume, dysuria and hematuria.  Musculoskeletal: Negative for gait problem and myalgias.  Neurological: Negative for syncope, facial asymmetry and weakness.  Rest of ROS, see pertinent positives sand negatives in HPI  Current Outpatient Medications on File Prior to Visit  Medication Sig Dispense Refill  . Alcohol  Swabs (B-D SINGLE USE SWABS REGULAR) PADS Use as necessary 300 each 6  . allopurinol (ZYLOPRIM) 300 MG tablet TAKE 1 TABLET BY MOUTH EVERY DAY 90 tablet 1  . amLODipine (NORVASC) 10 MG tablet TAKE 1 TABLET BY MOUTH EVERY DAY 90 tablet 1  . aspirin 81 MG tablet Take 81 mg by mouth daily.    Marland Kitchen atorvastatin (LIPITOR) 40 MG tablet Take 1 tablet (40 mg total) by mouth daily. 90 tablet 3  . Blood Glucose Monitoring Suppl (ONETOUCH VERIO IQ SYSTEM) w/Device KIT USE TO CHECK BLOOD SUGAR TWICE A DAY DX CODE E11.9 1 kit 2  . EASY COMFORT PEN NEEDLES 31G X 5 MM MISC Used to check  Blood glucose 3 times daily or prn. 100 each 3  . furosemide (LASIX) 20 MG tablet TAKE 1 TABLET BY MOUTH EVERY DAY 90 tablet 1  . insulin aspart (NOVOLOG FLEXPEN) 100 UNIT/ML FlexPen Inject into the skin in the morning and at bedtime. Taking 22 units total per day.    . insulin degludec (TRESIBA FLEXTOUCH) 100 UNIT/ML FlexTouch Pen INJECT 38 UNITS SUBCUTANEOUSLY ONCE DAILY 15 mL 1  . metFORMIN (GLUCOPHAGE) 1000 MG tablet TAKE ONE TABLET BY MOUTH TWICE DAILY. TAKE WITH A MEAL. 180 tablet 1  . metoprolol tartrate (LOPRESSOR) 100 MG tablet TAKE 1 TABLET BY MOUTH TWICE A DAY 180 tablet 2  . ONETOUCH DELICA LANCETS 06Y MISC Use to check blood sugar twice daily. DX Code E11.9 200 each 1  . ONETOUCH  VERIO test strip USE AS INSTRUCTED 200 strip 1  . ramipril (ALTACE) 10 MG capsule Take 10 mg by mouth daily.    . sildenafil (VIAGRA) 50 MG tablet Take 1 tablet (50 mg total) by mouth daily as needed for erectile dysfunction. 90 tablet 0  . spironolactone (ALDACTONE) 25 MG tablet TAKE 1 TABLET BY MOUTH EVERY DAY 90 tablet 0  . Fluocinolone Acetonide Body 0.01 % OIL Apply 1 application topically as directed. (Patient not taking: No sig reported)     No current facility-administered medications on file prior to visit.   Past Medical History:  Diagnosis Date  . Cataract    had surgery to remove them  . CORONARY ARTERY DISEASE 11/23/2006  .  DIABETES MELLITUS, TYPE II 11/23/2006  . GOUT 05/20/2008  . HYPERLIPIDEMIA 11/23/2006  . HYPERTENSION 11/23/2006  . 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 PNEUMONIA, LEFT LOWER LOBE 09/08/2009  . Rash    No Known Allergies  Social History   Socioeconomic History  . Marital status: Widowed    Spouse name: Not on file  . Number of children: 2  . Years of education: Not on file  . Highest education level: Not on file  Occupational History    Comment: Retired from [employer]  . Smoking status: Never Smoker  . Smokeless tobacco: Never Used  Vaping Use  . Vaping Use: Never used  Substance and Sexual Activity  . Alcohol use: No    Comment: stopped drinking etoh completely in 09/10/99  . Drug use: No  . Sexual activity: Yes    Comment: uses viagra  Other Topics Concern  . Not on file  Social History Narrative   Lives alone   Retired from [employer] in 1999/09/10   Twin sons, one son died of a stroke this year 09/09/2018) at age 67, his living son resides in [address]    His wife died of breast cancer 11 years ago    Social Determinants of Health   Financial Resource Strain: Low Risk   . Difficulty of Paying Living Expenses: Not very hard  Food Insecurity: No Food Insecurity  . Worried About Charity fundraiser in the Last Year: Never true  . Ran Out of Food in the Last Year: Never true  Transportation Needs: No Transportation Needs  . Lack of Transportation (Medical): No  . Lack of Transportation (Non-Medical): No  Physical Activity: Not on file  Stress: Not on file  Social Connections: Not on file   Vitals:   08/17/20 0832  BP: 118/80  Pulse: 100  Resp: 16  Temp: 98.2 F (36.8 C)  SpO2: 99%   Wt Readings from Last 3 Encounters:  08/17/20 221 lb (100.2 kg)  08/05/20 219 lb 12.8 oz (99.7 kg)  06/23/20 222 lb 12.8 oz (101.1 kg)   Body mass index is 29.97 kg/m.  Physical Exam Vitals and nursing note reviewed.  Constitutional:      General: He is not in  acute distress.    Appearance: He is well-developed.  HENT:     Head: Normocephalic and atraumatic.     Mouth/Throat:     Mouth: Mucous membranes are moist.     Pharynx: Oropharynx is clear.  Eyes:     Conjunctiva/sclera: Conjunctivae normal.  Cardiovascular:     Rate and Rhythm: Normal rate. Rhythm irregular. Occasional extrasystoles are present.    Pulses:          Dorsalis pedis pulses are 2+ on  the right side and 2+ on the left side.     Heart sounds: Murmur (SEM I-II/VI [id number]-[id number]) heard.    Pulmonary:     Effort: Pulmonary effort is normal. No respiratory distress.     Breath sounds: Normal breath sounds.  Chest:     Chest wall: Tenderness: hyperpig.  Abdominal:     Palpations: Abdomen is soft. There is no hepatomegaly or mass.     Tenderness: There is no abdominal tenderness.  Lymphadenopathy:     Cervical: No cervical adenopathy.  Skin:    General: Skin is warm.     Findings: Lesion present. No erythema or rash.          Comments: Hyperpigmented macular lesions on abdomen.  Neurological:     Mental Status: He is alert and oriented to person, place, and time.     Cranial Nerves: No cranial nerve deficit.     Gait: Gait normal.  Psychiatric:     Comments: Well groomed, good eye contact.   ASSESSMENT AND PLAN:  Mr. JEYREN DANOWSKI was seen today for 6 months follow-up.  No orders of the defined types were placed in this encounter.  GOUT Recommend continuing Colchicine 0.6 mg as needed for acute exacerbations. Continue Allopurinol 300 mg daily. Low purine diet.  Pure hypercholesterolemia He is not fasting today, so FLP was not done. It seems like he is going to have it check during his next appt with endocrinologist. Continue Atorvastatin 40 mg daily and low fat diet.  Essential hypertension BP adequately controlled. Continue Amlodipine 10 mg ,Metoprolol tartrate 100 mg bid,and Ramipril 10 mg daly. Recommend monitoring BP at home. Continue low salt  diet.  Return in about 5 months (around 01/14/2021) for cpe. 12/2020 procedure, mass removal 30 min (wednesday end of morning or afternoon).  Hyperpigmentation of skin ? Post inflammatory. No active lesions. Refills for topical Mycolog given to apply for pruritic and erythematous lesions, small amount.  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  Kailana Benninger G. [name], MD  [facility]. [place] office.  A few things to remember from today's visit:   Essential hypertension  Chronic gout without tophus, unspecified cause, unspecified site  Pure hypercholesterolemia  If you need refills please call your pharmacy. Do not use My Chart to request refills or for acute issues that need immediate attention.    Please be sure medication list is accurate. If a new problem present, please set up appointment sooner than planned today.

## 2020-08-17 NOTE — Assessment & Plan Note (Signed)
Recommend continuing Colchicine 0.6 mg as needed for acute exacerbations. Continue Allopurinol 300 mg daily. Low purine diet.

## 2020-08-17 NOTE — Patient Instructions (Signed)
A few things to remember from today's visit:   Essential hypertension  Chronic gout without tophus, unspecified cause, unspecified site  Pure hypercholesterolemia  If you need refills please call your pharmacy. Do not use My Chart to request refills or for acute issues that need immediate attention.    Please be sure medication list is accurate. If a new problem present, please set up appointment sooner than planned today.

## 2020-08-17 NOTE — Assessment & Plan Note (Addendum)
BP adequately controlled. Continue Amlodipine 10 mg ,Metoprolol tartrate 100 mg bid,and Ramipril 10 mg daly. Recommend monitoring BP at home. Continue low salt diet.

## 2020-08-17 NOTE — Assessment & Plan Note (Signed)
He is not fasting today, so FLP was not done. It seems like he is going to have it check during his next appt with endocrinologist. Continue Atorvastatin 40 mg daily and low fat diet.

## 2020-08-30 ENCOUNTER — Other Ambulatory Visit: Payer: Self-pay | Admitting: Endocrinology

## 2020-09-30 ENCOUNTER — Telehealth: Payer: Self-pay | Admitting: Pharmacist

## 2020-09-30 NOTE — Chronic Care Management (AMB) (Signed)
Chronic Care Management Pharmacy Assistant   Name: Patrick Wilcox  MRN: [id number] DOB: 1946-04-06   Reason for Encounter: Hypertension Adherence Call.   Recent office visits:  08/17/20-[name], [name], MD (OV).  Recent consult visits:  08/05/20-Kumar, [name], MD (OV).  Hospital visits:  None in previous 6 months   Medication changes indicated: 08/17/20-Colchicine 0.6 MG oral daily PRN (change in therapy), nystatin-triamcinolone 100000-0.1 Unit/GM-% Daily as needed on affected area (change in therapy), Minocycline HCI 100 MG 2 times daily (disc, patient not taking).   Medications: Outpatient Encounter Medications as of 09/30/2020  Medication Sig  . Alcohol Swabs (B-D SINGLE USE SWABS REGULAR) PADS Use as necessary  . allopurinol (ZYLOPRIM) 300 MG tablet TAKE 1 TABLET BY MOUTH EVERY DAY  . amLODipine (NORVASC) 10 MG tablet TAKE 1 TABLET BY MOUTH EVERY DAY  . aspirin 81 MG tablet Take 81 mg by mouth daily.  Patrick Wilcox Kitchen atorvastatin (LIPITOR) 40 MG tablet Take 1 tablet (40 mg total) by mouth daily.  . Blood Glucose Monitoring Suppl (ONETOUCH VERIO IQ SYSTEM) w/Device KIT USE TO CHECK BLOOD SUGAR TWICE A DAY DX CODE E11.9  . colchicine 0.6 MG tablet Take 1 tablet (0.6 mg total) by mouth daily as needed.  Patrick Wilcox Kitchen EASY COMFORT PEN NEEDLES 31G X 5 MM MISC Used to check  Blood glucose 3 times daily or prn.  . Fluocinolone Acetonide Body 0.01 % OIL Apply 1 application topically as directed. (Patient not taking: No sig reported)  . furosemide (LASIX) 20 MG tablet TAKE 1 TABLET BY MOUTH EVERY DAY  . insulin aspart (NOVOLOG FLEXPEN) 100 UNIT/ML FlexPen Inject into the skin in the morning and at bedtime. Taking 22 units total per day.  . insulin degludec (TRESIBA FLEXTOUCH) 100 UNIT/ML FlexTouch Pen INJECT 38 UNITS SUBCUTANEOUSLY ONCE DAILY  . metFORMIN (GLUCOPHAGE) 1000 MG tablet TAKE ONE TABLET BY MOUTH TWICE DAILY. TAKE WITH A MEAL.  . metoprolol tartrate (LOPRESSOR) 100 MG tablet TAKE 1 TABLET BY MOUTH  TWICE A DAY  . nystatin-triamcinolone ointment (MYCOLOG) Daily as needed on affected area.  Patrick Wilcox LANCETS 72Z MISC Use to check blood sugar twice daily. DX Code E11.9  . ONETOUCH VERIO test strip USE AS INSTRUCTED  . ramipril (ALTACE) 10 MG capsule Take 10 mg by mouth daily.  . sildenafil (VIAGRA) 50 MG tablet Take 1 tablet (50 mg total) by mouth daily as needed for erectile dysfunction.  Patrick Wilcox Kitchen spironolactone (ALDACTONE) 25 MG tablet TAKE 1 TABLET BY MOUTH EVERY DAY   No facility-administered encounter medications on file as of 09/30/2020.    Reviewed chart prior to disease state call. Spoke with patient regarding BP  Recent Office Vitals: BP Readings from Last 3 Encounters:  08/17/20 118/80  08/05/20 138/78  06/23/20 (!) 148/78   Pulse Readings from Last 3 Encounters:  08/17/20 100  08/05/20 93  06/23/20 70    Wt Readings from Last 3 Encounters:  08/17/20 221 lb (100.2 kg)  08/05/20 219 lb 12.8 oz (99.7 kg)  06/23/20 222 lb 12.8 oz (101.1 kg)     Kidney Function Lab Results  Component Value Date/Time   CREATININE 0.90 07/30/2020 08:31 AM   CREATININE 0.80 06/01/2020 08:55 AM   GFR 83.97 07/30/2020 08:31 AM   GFRNONAA 121 09/19/2008 08:59 AM   GFRAA 147 09/19/2008 08:59 AM    BMP Latest Ref Rng & Units 07/30/2020 06/01/2020 04/06/2020  Glucose 70 - 99 mg/dL 104(H) 201(H) 267(H)  BUN 6 - 23 mg/dL 11 10  10  Creatinine 0.40 - 1.50 mg/dL 0.90 0.80 0.80  Sodium 135 - 145 mEq/L 136 137 136  Potassium 3.5 - 5.1 mEq/L 4.2 4.2 4.1  Chloride 96 - 112 mEq/L 102 101 101  CO2 19 - 32 mEq/L '26 28 26  ' Calcium 8.4 - 10.5 mg/dL 9.7 9.3 9.3    . Current antihypertensive regimen:  ? amlodipine 28m, 1 tablet once daily ? metoprolol tartrate 1070m 1 tablet twice daily ? ramipril 1029m1 capsule once daily  ? furosemide 40m76m tablet once daily  ? Spironolactone 25mg70mtablet once daily  . How often are you checking your Blood Pressure? Once or twice a week   . Current  home BP readings: None provided at this time.  . What recent interventions/DTPs have been made by any provider to improve Blood Pressure control since last CPP Visit:  - Recent Interventions was to check BP daily and follow up visit in 1 month for reassessment. ? Patient to bring blood pressure monitor to tomorrow's office visit  . Any recent hospitalizations or ED visits since last visit with CPP? No   . What diet changes have been made to improve Blood Pressure Control?  o Patient reports he limited drinking sodas and eating red meat (rather stick with chicken and fish). Very often patient eats [employer], [employer], and [employer] food.  . What exercise is being done to improve your Blood Pressure Control?  o Patient reports he is walking from 30 minutes to an hour daily.  Adherence Review: Is the patient currently on ACE/ARB medication? Yes Does the patient have >5 day gap between last estimated fill dates? No   Star Rating Drugs: Atorvastatin 40 MG tablet: 90 DS, [facility] PM[facility]ipril 10 MG capsule: 90 DS, [facility] PAnthondelJeni Salles Notified.  CandiRaynelle Highland CCulvermacist Assistant [phone] Time: 39 minutes

## 2020-10-14 ENCOUNTER — Other Ambulatory Visit: Payer: Self-pay | Admitting: Family Medicine

## 2020-10-15 ENCOUNTER — Other Ambulatory Visit: Payer: Self-pay | Admitting: Endocrinology

## 2020-10-15 DIAGNOSIS — E1169 Type 2 diabetes mellitus with other specified complication: Secondary | ICD-10-CM

## 2020-11-06 ENCOUNTER — Telehealth: Payer: Self-pay | Admitting: Pharmacist

## 2020-11-06 NOTE — Chronic Care Management (AMB) (Signed)
I spoke with the patient about his/her upcoming appointment on 11/09/2020  @ 8:30 am with the clinical pharmacist. He was asked to please have all medication on hand to review with the pharmacist. He confirmed the appointment.   Maia Breslow, [place] [phone]

## 2020-11-09 ENCOUNTER — Ambulatory Visit (INDEPENDENT_AMBULATORY_CARE_PROVIDER_SITE_OTHER): Payer: HMO | Admitting: Pharmacist

## 2020-11-09 DIAGNOSIS — Z794 Long term (current) use of insulin: Secondary | ICD-10-CM | POA: Diagnosis not present

## 2020-11-09 DIAGNOSIS — E1169 Type 2 diabetes mellitus with other specified complication: Secondary | ICD-10-CM

## 2020-11-09 DIAGNOSIS — I10 Essential (primary) hypertension: Secondary | ICD-10-CM

## 2020-11-09 NOTE — Progress Notes (Signed)
Chronic Care Management Pharmacy Note  11/16/2020 Name:  Patrick Wilcox MRN:  [id number] DOB:  11-12-1945  Subjective: Patrick Wilcox is an 75 y.o. year old male who is a primary patient of [name], [name], MD.  The CCM team was consulted for assistance with disease management and care coordination needs.    Engaged with patient by telephone for follow up visit in response to provider referral for pharmacy case management and/or care coordination services.   Consent to Services:  The patient was given information about Chronic Care Management services, agreed to services, and gave verbal consent prior to initiation of services.  Please see initial visit note for detailed documentation.   Patient Care Team: [name], [name], MD as PCP - General (Family Medicine) [name], MD as PCP - Electrophysiology (Cardiology) [name], MD as Consulting Physician (Endocrinology) [name], [facility] as Pharmacist (Pharmacist)  Recent office visits: 08/17/20 [name], MD: Patient presented for 6 month follow up. No medication changes made.  Recent consult visits: 08/05/20 [name], MD: Patient presented for diabetes follow up. A1c increased to 7.9%. Decreased Tresiba to 36 units.  06/23/20 Lars Mage, MD (cardiology): Patient presented for PCVs follow up.   06/18/20 Wilfrid Lund, MD (gastro): Patient presented for colon cancer screening.  06/08/20 [name], MD: Patient presented for diabetes follow up. A1c decreased to 7.4%. No medication changes made.  05/12/20 Lars Mage, MD (cardiology): Patient presented for PCVs evaluation.   Hospital visits: None in previous 6 months  Objective:  Lab Results  Component Value Date   CREATININE 0.97 11/10/2020   BUN 12 11/10/2020   GFR 76.60 11/10/2020   GFRNONAA 121 09/19/2008   GFRAA 147 09/19/2008   NA 134 (L) 11/10/2020   K 4.1 11/10/2020   CALCIUM 9.3 11/10/2020   CO2 26 11/10/2020   GLUCOSE 244 (H)  11/10/2020    Lab Results  Component Value Date/Time   HGBA1C 8.5 (H) 11/10/2020 08:57 AM   HGBA1C 7.9 (H) 07/30/2020 08:31 AM   FRUCTOSAMINE 305 (H) 06/01/2020 08:55 AM   FRUCTOSAMINE 296 (H) 12/31/2019 10:23 AM   GFR 76.60 11/10/2020 08:57 AM   GFR 83.97 07/30/2020 08:31 AM   MICROALBUR 84.7 (H) 07/30/2020 08:31 AM   MICROALBUR 148.4 (H) 09/09/2019 09:47 AM    Last diabetic Eye exam:  Lab Results  Component Value Date/Time   HMDIABEYEEXA No Retinopathy 10/09/2017 12:00 AM    Last diabetic Foot exam:  Lab Results  Component Value Date/Time   HMDIABFOOTEX done 08/22/2011 12:00 AM     Lab Results  Component Value Date   CHOL 102 11/10/2020   HDL 27.70 (L) 11/10/2020   LDLCALC 48 11/10/2020   TRIG 129.0 11/10/2020   CHOLHDL 4 11/10/2020    Hepatic Function Latest Ref Rng & Units 11/10/2020 09/09/2019 03/16/2018  Total Protein 6.0 - 8.3 g/dL 7.5 7.6 7.8  Albumin 3.5 - 5.2 g/dL 4.3 4.3 4.3  AST 0 - 37 U/L '31 21 26  ' ALT 0 - 53 U/L '25 22 24  ' Alk Phosphatase 39 - 117 U/L 114 119(H) 103  Total Bilirubin 0.2 - 1.2 mg/dL 1.0 1.1 0.7  Bilirubin, Direct 0.0 - 0.3 mg/dL - - -    Lab Results  Component Value Date/Time   TSH 3.33 09/07/2010 08:04 AM   TSH 1.98 09/19/2008 08:59 AM    CBC Latest Ref Rng & Units 09/07/2010 09/19/2008 08/29/2007  WBC 4.5 - 10.5 K/uL 10.9(H) 9.5 9.3  Hemoglobin  13.0 - 17.0 g/dL 14.5 14.2 13.6  Hematocrit 39.0 - 52.0 % 41.8 40.2 40.3  Platelets 150.0 - 400.0 K/uL 195.0 175 210    No results found for: VD25OH  Clinical ASCVD: Yes  The ASCVD Risk score Mikey Bussing DC Jr., et al., 2013) failed to calculate for the following reasons:   The patient has a prior MI or stroke diagnosis    Depression screen [facility] 2/9 01/01/2020 05/15/2019 02/06/2019  Decreased Interest 0 0 0  Down, Depressed, Hopeless 0 0 0  PHQ - 2 Score 0 0 0  Some recent data might be hidden      Social History   Tobacco Use  Smoking Status Never Smoker  Smokeless Tobacco Never Used    BP Readings from Last 3 Encounters:  11/12/20 140/80  08/17/20 118/80  08/05/20 138/78   Pulse Readings from Last 3 Encounters:  11/12/20 96  08/17/20 100  08/05/20 93   Wt Readings from Last 3 Encounters:  11/12/20 219 lb 8 oz (99.6 kg)  08/17/20 221 lb (100.2 kg)  08/05/20 219 lb 12.8 oz (99.7 kg)   BMI Readings from Last 3 Encounters:  11/12/20 29.77 kg/m  08/17/20 29.97 kg/m  08/05/20 29.81 kg/m    Assessment/Interventions: Review of patient past medical history, allergies, medications, health status, including review of consultants reports, laboratory and other test data, was performed as part of comprehensive evaluation and provision of chronic care management services.   SDOH:  (Social Determinants of Health) assessments and interventions performed: No  SDOH Screenings   Alcohol Screen: Not on file  Depression (PHQ2-9): Low Risk   . PHQ-2 Score: 0  Financial Resource Strain: Low Risk   . Difficulty of Paying Living Expenses: Not very hard  Food Insecurity: No Food Insecurity  . Worried About Charity fundraiser in the Last Year: Never true  . Ran Out of Food in the Last Year: Never true  Housing: Not on file  Physical Activity: Not on file  Social Connections: Not on file  Stress: Not on file  Tobacco Use: Low Risk   . Smoking Tobacco Use: Never Smoker  . Smokeless Tobacco Use: Never Used  Transportation Needs: No Transportation Needs  . Lack of Transportation (Medical): No  . Lack of Transportation (Non-Medical): No    Patient reports he recently celebrated his birthday and had a great day. His son bought him some clothes and his brother brought him out for dinner.  Patient reported he ran out of insulin for 5 days as the pharmacy was unable to fill. He is unsure what happened but blames the pharmacy and is unhappy with [facility]. He does not wish to transfer pharmacies as he does not currently pay anything at [facility] and is worried that he will if he  transfers.  CCM Care Plan  No Known Allergies  Medications Reviewed Today    Reviewed by Jacqualin Combes, CMA (Certified Medical Assistant) on 11/12/20 at 19  Med List Status: <None>  Medication Order Taking? Sig Documenting Provider Last Dose Status Informant  Alcohol Swabs (B-D SINGLE USE SWABS REGULAR) PADS [id number] Yes Use as necessary Marletta Lor, MD Taking Active   allopurinol (ZYLOPRIM) 300 MG tablet [id number] Yes TAKE 1 TABLET BY MOUTH EVERY DAY [name], [name], MD Taking Active   amLODipine (NORVASC) 10 MG tablet [id number] Yes TAKE 1 TABLET BY MOUTH EVERY DAY [name], [name], MD Taking Active   aspirin 81 MG tablet [id number] Yes Take 81 mg by mouth  daily. [provider] Taking Active   atorvastatin (LIPITOR) 40 MG tablet [id number] Yes Take 1 tablet (40 mg total) by mouth daily. [name], [name], MD Taking Active   Blood Glucose Monitoring Suppl [facility] VERIO IQ SYSTEM) w/Device Drucie Opitz [id number] Yes USE TO CHECK BLOOD SUGAR TWICE A DAY DX CODE E11.9 [name], MD Taking Active   colchicine 0.6 MG tablet [id number] Yes Take 1 tablet (0.6 mg total) by mouth daily as needed. [name], [name], MD Taking Active   EASY COMFORT PEN NEEDLES 31G X 5 MM MISC [id number] Yes Used to check  Blood glucose 3 times daily or prn. Marletta Lor, MD Taking Active   Fluocinolone Acetonide Body 0.01 % OIL [id number] Yes Apply 1 application topically as directed. [provider] Taking Active   furosemide (LASIX) 20 MG tablet [id number] Yes TAKE 1 TABLET BY MOUTH EVERY DAY [name], [name], MD Taking Active   insulin aspart (NOVOLOG FLEXPEN) 100 UNIT/ML FlexPen [id number] Yes Inject into the skin in the morning and at bedtime. Taking 22 units total per day. [provider] Taking Active   metFORMIN (GLUCOPHAGE) 1000 MG tablet [id number] Yes TAKE ONE TABLET BY MOUTH TWICE DAILY. TAKE WITH A MEAL. [name], [name], MD Taking Active   metoprolol tartrate (LOPRESSOR) 100  MG tablet [id number] Yes TAKE 1 TABLET BY MOUTH TWICE A DAY [name], [name], MD Taking Active   nystatin-triamcinolone ointment [facility]) [id number] Yes Daily as needed on affected area. [name], [name], MD Taking Active   [facility] LANCETS 22L MISC [id number] Yes Use to check blood sugar twice daily. DX Code E11.9 [name], MD Taking Active   [facility] VERIO test strip [id number] Yes USE AS INSTRUCTED [name], [name], MD Taking Active   ramipril (ALTACE) 10 MG capsule [id number] Yes Take 10 mg by mouth daily. [provider] Taking Active   sildenafil (VIAGRA) 50 MG tablet [id number] Yes Take 1 tablet (50 mg total) by mouth daily as needed for erectile dysfunction. [name], [name], MD Taking Active   spironolactone (ALDACTONE) 25 MG tablet [id number] Yes TAKE 1 TABLET BY MOUTH EVERY DAY [name], MD Taking Active   TRESIBA FLEXTOUCH 100 UNIT/ML FlexTouch Pen [id number] Yes INJECT 77 UNITS SUBCUTANEOUSLY ONCE DAILY [name], MD Taking Active           Patient Active Problem List   Diagnosis Date Noted  . Microalbuminuria due to type 2 diabetes mellitus ([place]) 08/05/2020  . Hypertension associated with diabetes ([place]) 08/26/2015  . PNEUMONIA, LEFT LOWER LOBE 09/08/2009  . GOUT 05/20/2008  . Type 2 diabetes mellitus with other specified complication ([place]) 47/03/6282  . Pure hypercholesterolemia 11/23/2006  . Essential hypertension 11/23/2006  . 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 Coronary atherosclerosis 11/23/2006    Immunization History  Administered Date(s) Administered  . Fluad Quad(high Dose 65+) 05/15/2019, 04/08/2020  . Influenza Split 04/13/2011, 05/31/2012  . Influenza Whole 05/21/2007, 06/22/2009, 06/15/2010  . Influenza, High Dose Seasonal PF 05/02/2013, 03/31/2016, 06/18/2018  . Influenza,inj,Quad PF,6+ Mos 04/02/2014, 03/30/2015, 06/08/2017  . PFIZER(Purple Top)SARS-COV-2 Vaccination 09/12/2019, 10/04/2019, 06/20/2020  . Pneumococcal Conjugate-13  08/26/2015  . Pneumococcal Polysaccharide-23 05/02/2013  . Tetanus 08/02/2013  . Zoster Recombinat (Shingrix) 06/20/2020, 09/08/2020    Conditions to be addressed/monitored:  Hypertension, Hyperlipidemia, Diabetes and Gout  Care Plan : [place]  Updates made by [name], [place] since 11/16/2020 12:00 AM    Problem: Problem: Hypertension, Hyperlipidemia, Diabetes and Gout  Long-Range Goal: Patient-Specific Goal   Start Date: 11/09/2020  Expected End Date: 11/09/2021  This Visit's Progress: On track  Priority: High  Note:   Current Barriers:  . Unable to independently monitor therapeutic efficacy . Unable to achieve control of diabetes   Pharmacist Clinical Goal(s):  Marland Kitchen Patient will verbalize ability to afford treatment regimen . achieve adherence to monitoring guidelines and medication adherence to achieve therapeutic efficacy . achieve control of diabetes as evidenced by A1c  through collaboration with PharmD and provider.   Interventions: . 1:1 collaboration with [name], [name], MD regarding development and update of comprehensive plan of care as evidenced by provider attestation and co-signature . Inter-disciplinary care team collaboration (see longitudinal plan of care) . Comprehensive medication review performed; medication list updated in electronic medical record  Hypertension (BP goal <140/90) -Not ideally controlled -Current treatment:  amlodipine 60m, 1 tablet once daily  metoprolol tartrate 1080m 1 tablet twice daily  ramipril 1074m1 capsule once daily   furosemide 55m107m tablet once daily  sprironolactone  25mg65mtablet once daily -Medications previously tried: n/a -Current home readings: could not provide as patient does not check consistently -Current dietary habits: did not discuss -Current exercise habits: did not discuss -Denies hypotensive/hypertensive symptoms -Educated on Daily salt intake goal < 2300 mg; Exercise goal of  150 minutes per week; Importance of home blood pressure monitoring; Proper BP monitoring technique; Symptoms of hypotension and importance of maintaining adequate hydration; -Counseled to monitor BP at home weekly, document, and provide log at future appointments -Counseled on diet and exercise extensively Recommended to continue current medication  Hyperlipidemia/history of MI: (LDL goal < 70) -Not ideally controlled -Current treatment:  atorvastatin 40mg,49mablet once daily   aspirin 81mg, 23mblet once daily  -Medications previously tried: none  -Current dietary patterns: did not discuss -Current exercise habits: did not discuss -Educated on Cholesterol goals;  Importance of limiting foods high in cholesterol; Exercise goal of 150 minutes per week; -Counseled on diet and exercise extensively Recommended to continue current medication  Diabetes (A1c goal <7%) -Uncontrolled -Current medications:  insulin aspart (Novolog Flexpen), inject 10-12 units at breakfast if needed and 12 units at suppertime   insulin degludec (TresibTyler Aasuch), inject 40 units at suppertime (changing times)   metformin 1000mg, 139mlet twice daily  -Medications previously tried: n/a  -Current home glucose readings . fasting glucose: 121, 103  . post prandial glucose: n/a -Denies hypoglycemic/hyperglycemic symptoms -Current meal patterns:  . breakfast: did not discuss . lunch: did not discuss  . dinner: did not discuss . snacks: did not discuss . drinks: cut back on sodas drinking 5 per week, sweet tea, drinking juices; water -Current exercise: patient reports he is walking 2-3 times a week and is going to walk today -Educated on A1c and blood sugar goals; Exercise goal of 150 minutes per week; Benefits of routine self-monitoring of blood sugar; Continuous glucose monitoring; -Counseled to check feet daily and get yearly eye exams -Counseled on diet and exercise extensively Recommended to  continue current medication [facility] to [facility] a [facility]  Gout (Goal: uric acid < 6 and prevent flare ups) -Controlled -Current treatment   allopurinol 300mg, 1 65met once daily   colchicine 0.6mg, 1 ta64mt once daily as needed -Medications previously tried: none  -Recommended to continue current medication  Health Maintenance -Vaccine gaps: none -Current therapy:  . No medications -Educated on Cost vs benefit  of each product must be carefully weighed by individual consumer -Patient is satisfied with current therapy and denies issues -Recommended to continue as is  Patient Goals/Self-Care Activities . Patient will:  - take medications as prescribed focus on medication adherence by setting a timer for evening dose of metoprolol check glucose a few times daily, document, and provide at future appointments check blood pressure weekly, document, and provide at future appointments target a minimum of 150 minutes of moderate intensity exercise weekly  Follow Up Plan: Telephone follow up appointment with care management team member scheduled for: 3 months       Medication Assistance: None required.  Patient affirms current coverage meets needs.  Patient's preferred pharmacy is:  [place], [place] - [address] [place]  [address] Phone: [phone] Fax: [phone]  [id number] [address] [place]Phone: [phone]: [id number] [id number]: [phone]: [phone] Uses pill box? Yes Pt endorses 80% compliance  We discussed: Benefits of medication synchronization, packaging and delivery as well as enhanced  pharmacist oversight with [employer]. Patient decided to: Continue current medication management strategy  Care Plan and Follow Up Patient Decision:  Patient agrees to Care Plan and Follow-up.  Plan: Telephone follow up appointment with care management team member scheduled for:  3 months  MJeni Salles PharmD BWoodlandPharmacist LHartlyat [phone]

## 2020-11-10 ENCOUNTER — Other Ambulatory Visit (INDEPENDENT_AMBULATORY_CARE_PROVIDER_SITE_OTHER): Payer: HMO

## 2020-11-10 ENCOUNTER — Other Ambulatory Visit: Payer: Self-pay

## 2020-11-10 DIAGNOSIS — Z794 Long term (current) use of insulin: Secondary | ICD-10-CM

## 2020-11-10 DIAGNOSIS — E1165 Type 2 diabetes mellitus with hyperglycemia: Secondary | ICD-10-CM | POA: Diagnosis not present

## 2020-11-10 DIAGNOSIS — E782 Mixed hyperlipidemia: Secondary | ICD-10-CM | POA: Diagnosis not present

## 2020-11-10 LAB — COMPREHENSIVE METABOLIC PANEL
ALT: 25 U/L (ref 0–53)
AST: 31 U/L (ref 0–37)
Albumin: 4.3 g/dL (ref 3.5–5.2)
Alkaline Phosphatase: 114 U/L (ref 39–117)
BUN: 12 mg/dL (ref 6–23)
CO2: 26 mEq/L (ref 19–32)
Calcium: 9.3 mg/dL (ref 8.4–10.5)
Chloride: 100 mEq/L (ref 96–112)
Creatinine, Ser: 0.97 mg/dL (ref 0.40–1.50)
GFR: 76.6 mL/min (ref >60.00)
Glucose, Bld: 244 mg/dL — ABNORMAL HIGH (ref 70–99)
Potassium: 4.1 mEq/L (ref 3.5–5.1)
Sodium: 134 mEq/L — ABNORMAL LOW (ref 135–145)
Total Bilirubin: 1 mg/dL (ref 0.2–1.2)
Total Protein: 7.5 g/dL (ref 6.0–8.3)

## 2020-11-10 LAB — LIPID PANEL
Cholesterol: 102 mg/dL (ref 0–200)
HDL: 27.7 mg/dL — ABNORMAL LOW (ref >39.00)
LDL Cholesterol: 48 mg/dL (ref 0–99)
NonHDL: 73.95
Total CHOL/HDL Ratio: 4
Triglycerides: 129 mg/dL (ref 0.0–149.0)
VLDL: 25.8 mg/dL (ref 0.0–40.0)

## 2020-11-10 LAB — HEMOGLOBIN A1C: Hgb A1c MFr Bld: 8.5 % — ABNORMAL HIGH (ref 4.6–6.5)

## 2020-11-12 ENCOUNTER — Ambulatory Visit (INDEPENDENT_AMBULATORY_CARE_PROVIDER_SITE_OTHER): Payer: HMO | Admitting: Endocrinology

## 2020-11-12 ENCOUNTER — Encounter: Payer: Self-pay | Admitting: Endocrinology

## 2020-11-12 VITALS — BP 140/80 | HR 96 | Ht 72.0 in | Wt 219.5 lb

## 2020-11-12 DIAGNOSIS — E1129 Type 2 diabetes mellitus with other diabetic kidney complication: Secondary | ICD-10-CM | POA: Diagnosis not present

## 2020-11-12 DIAGNOSIS — R809 Proteinuria, unspecified: Secondary | ICD-10-CM

## 2020-11-12 DIAGNOSIS — Z794 Long term (current) use of insulin: Secondary | ICD-10-CM | POA: Diagnosis not present

## 2020-11-12 DIAGNOSIS — I10 Essential (primary) hypertension: Secondary | ICD-10-CM

## 2020-11-12 DIAGNOSIS — E1165 Type 2 diabetes mellitus with hyperglycemia: Secondary | ICD-10-CM

## 2020-11-12 NOTE — Patient Instructions (Signed)
Check blood sugars on waking up days a week  Also check blood sugars about 2 hours after meals and do this after different meals by rotation  Recommended blood sugar levels on waking up are 90-130 and about 2 hours after meal is 130-160  Please bring your blood sugar monitor to each visit, thank you   

## 2020-11-12 NOTE — Progress Notes (Signed)
Patient ID: Patrick Wilcox, male   DOB: Jul 12, 1945, 75 y.o.   MRN: [id number]    Reason for Appointment: Followup for Type 2 Diabetes   History of Present Illness:          Diagnosis: Type 2 diabetes mellitus, date of diagnosis: 2004       Past history:  At diagnosis he was having symptoms of drowsiness, difficulty breathing and blood sugar was reportedly over 600 He was initially treated with insulin but subsequently switched back to oral drugs He may have taken metformin and other medications for some time before going back on insulin His records indicate that he had been taking Amaryl and Januvia before going back on insulin in mid-2014 when his A1c had gone up over 13 Initially was given premixed insulin and then switched to Lantus and Humalog Record of his A1c indicates that his levels previously had been over 8% since 02/2012 He  had much better blood sugar control on his last visit with his changing his lifestyle and being instructed by the nurse educator  His Lantus did not appear to have 24 hour control with once a day injection  Recent history:   INSULIN regimen is described as:  38 units Tresiba daily in pm., Novolog 15 -22 units after breakfast and dinner        Oral hypoglycemic drugs the patient is taking are: Metformin 1 g twice a day     His A1c is 8.5 was 7.9, previously   Glucose monitoring, current management and problems identified:  He is now taking his NovoLog 1 to 2 hours after eating  This is despite previously doing better with his insulin timing and compliance  He is not understanding the need to take mealtime insulin before starting to eat  As before he is checking blood sugars infrequently and mostly in the morning, no readings after meals lately at all  He has started doing more walking up to 3 days a week  Weight is about the same  He has some protein at breakfast usually  However his blood sugar after breakfast in the lab was 244,  this was without his NovoLog  Although he thinks he is taking his evening [name] consistently he may possibly be taking less insulin sometimes  Also apparently he was out of insulin for a week because of the pharmacy not having it in stock  No hypoglycemia reported  DIET: Has seen the dietitian in 2014 and nurse educator in 8/18       Meals: 2 meals per day 10 am and 6 pm.   Breakfast is  eggs;  toast, eggs, has fruit for snacks.  Evening meal usually vegetables, tuna salad, chicken.       Compliance with the medical regimen:  Fair    Glucose monitoring: Checking less than 1 times a day        Glucometer:  One Touch Verio    Blood Glucose readings  from meter download   PRE-MEAL Fasting Lunch Dinner Bedtime Overall  Glucose range:  97-151      Mean/median:     123   PREVIOUSLY  FASTING range 67-256, AVERAGE 143 Midday average 120 Afternoon 145 OVERALL average 127   Exercise:  walking about a mile, 4-5/7 days   Weight history:  Wt Readings from Last 3 Encounters:  11/12/20 219 lb 8 oz (99.6 kg)  08/17/20 221 lb (100.2 kg)  08/05/20 219 lb 12.8 oz (99.7 kg)  Glycemic control:   Lab Results  Component Value Date   HGBA1C 8.5 (H) 11/10/2020   HGBA1C 7.9 (H) 07/30/2020   HGBA1C 9.4 (H) 04/06/2020   Lab Results  Component Value Date   MICROALBUR 84.7 (H) 07/30/2020   LDLCALC 48 11/10/2020   CREATININE 0.97 11/10/2020   Lab Results  Component Value Date   FRUCTOSAMINE 305 (H) 06/01/2020   FRUCTOSAMINE 296 (H) 12/31/2019   FRUCTOSAMINE 315 (H) 09/09/2019    Other active problems: See review of systems   Lab on 11/10/2020  Component Date Value Ref Range Status  . Cholesterol 11/10/2020 102  0 - 200 mg/dL Final   ATP III Classification       Desirable:  < 200 mg/dL               Borderline High:  200 - 239 mg/dL          High:  > = 240 mg/dL  . Triglycerides 11/10/2020 129.0  0.0 - 149.0 mg/dL Final   Normal:  <150 mg/dLBorderline High:  150 - 199  mg/dL  . HDL 11/10/2020 27.70* >39.00 mg/dL Final  . VLDL 11/10/2020 25.8  0.0 - 40.0 mg/dL Final  . LDL Cholesterol 11/10/2020 48  0 - 99 mg/dL Final  . Total CHOL/HDL Ratio 11/10/2020 4   Final                  Men          Women1/2 Average Risk     3.4          3.3Average Risk          5.0          4.42X Average Risk          9.6          7.13X Average Risk          15.0          11.0                      . NonHDL 11/10/2020 73.95   Final   NOTE:  Non-HDL goal should be 30 mg/dL higher than patient's LDL goal (i.e. LDL goal of < 70 mg/dL, would have non-HDL goal of < 100 mg/dL)  . Sodium 11/10/2020 134* 135 - 145 mEq/L Final  . Potassium 11/10/2020 4.1  3.5 - 5.1 mEq/L Final  . Chloride 11/10/2020 100  96 - 112 mEq/L Final  . CO2 11/10/2020 26  19 - 32 mEq/L Final  . Glucose, Bld 11/10/2020 244* 70 - 99 mg/dL Final  . BUN 11/10/2020 12  6 - 23 mg/dL Final  . Creatinine, Ser 11/10/2020 0.97  0.40 - 1.50 mg/dL Final  . Total Bilirubin 11/10/2020 1.0  0.2 - 1.2 mg/dL Final  . Alkaline Phosphatase 11/10/2020 114  39 - 117 U/L Final  . AST 11/10/2020 31  0 - 37 U/L Final  . ALT 11/10/2020 25  0 - 53 U/L Final  . Total Protein 11/10/2020 7.5  6.0 - 8.3 g/dL Final  . Albumin 11/10/2020 4.3  3.5 - 5.2 g/dL Final  . GFR 11/10/2020 76.60  >60.00 mL/min Final   Calculated using the CKD-EPI Creatinine Equation (2021)  . Calcium 11/10/2020 9.3  8.4 - 10.5 mg/dL Final  . Hgb A1c MFr Bld 11/10/2020 8.5* 4.6 - 6.5 % Final   Glycemic Control Guidelines for People with Diabetes:Non Diabetic:  <6%Goal of Therapy: <7%Additional Action  Suggested:  >8%       Allergies as of 11/12/2020   No Known Allergies     Medication List       Accurate as of Nov 12, 2020 12:47 PM. If you have any questions, ask your nurse or doctor.        allopurinol 300 MG tablet Commonly known as: ZYLOPRIM TAKE 1 TABLET BY MOUTH EVERY DAY   amLODipine 10 MG tablet Commonly known as: NORVASC TAKE 1 TABLET BY MOUTH  EVERY DAY   aspirin 81 MG tablet Take 81 mg by mouth daily.   atorvastatin 40 MG tablet Commonly known as: LIPITOR Take 1 tablet (40 mg total) by mouth daily.   B-D SINGLE USE SWABS REGULAR Pads Use as necessary   colchicine 0.6 MG tablet Take 1 tablet (0.6 mg total) by mouth daily as needed.   Easy Comfort Pen Needles 31G X 5 MM Misc Generic drug: Insulin Pen Needle Used to check  Blood glucose 3 times daily or prn.   Fluocinolone Acetonide Body 0.01 % Oil Apply 1 application topically as directed.   furosemide 20 MG tablet Commonly known as: LASIX TAKE 1 TABLET BY MOUTH EVERY DAY   metFORMIN 1000 MG tablet Commonly known as: GLUCOPHAGE TAKE ONE TABLET BY MOUTH TWICE DAILY. TAKE WITH A MEAL.   metoprolol tartrate 100 MG tablet Commonly known as: LOPRESSOR TAKE 1 TABLET BY MOUTH TWICE A DAY   NovoLOG FlexPen 100 UNIT/ML FlexPen Generic drug: insulin aspart Inject into the skin in the morning and at bedtime. Taking 22 units total per day.   nystatin-triamcinolone ointment Commonly known as: MYCOLOG Daily as needed on affected area.   OneTouch Delica Lancets 93T Misc Use to check blood sugar twice daily. DX Code E11.9   OneTouch Verio IQ System w/Device Kit USE TO CHECK BLOOD SUGAR TWICE A DAY DX CODE E11.9   OneTouch Verio test strip Generic drug: glucose blood USE AS INSTRUCTED   ramipril 10 MG capsule Commonly known as: ALTACE Take 10 mg by mouth daily.   sildenafil 50 MG tablet Commonly known as: VIAGRA Take 1 tablet (50 mg total) by mouth daily as needed for erectile dysfunction.   spironolactone 25 MG tablet Commonly known as: ALDACTONE TAKE 1 TABLET BY MOUTH EVERY DAY   Tresiba FlexTouch 100 UNIT/ML FlexTouch Pen Generic drug: insulin degludec INJECT 38 UNITS SUBCUTANEOUSLY ONCE DAILY       Allergies:  No Known Allergies  Past Medical History:  Diagnosis Date  . Cataract    had surgery to remove them  . CORONARY ARTERY DISEASE  11/23/2006  . DIABETES MELLITUS, TYPE II 11/23/2006  . GOUT 05/20/2008  . HYPERLIPIDEMIA 11/23/2006  . HYPERTENSION 11/23/2006  . 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 PNEUMONIA, LEFT LOWER LOBE 09/08/2009  . Rash     Past Surgical History:  Procedure Laterality Date  . CORONARY ANGIOPLASTY WITH STENT PLACEMENT    . CORONARY ARTERY BYPASS GRAFT  2003    Family History  Problem Relation Age of Onset  . Heart disease Mother   . Heart disease Father   . Diabetes Sister   . Diabetes Brother     Social History:  reports that he has never smoked. He has never used smokeless tobacco. He reports that he does not drink alcohol and does not use drugs.    Review of Systems        Lipids:  LDL has been treated to goal with Lipitor 40 mg Has  history of CAD, followed by PCP HDL has been low       Lab Results  Component Value Date   CHOL 102 11/10/2020   HDL 27.70 (L) 11/10/2020   LDLCALC 48 11/10/2020   TRIG 129.0 11/10/2020   CHOLHDL 4 11/10/2020       Is high blood pressure has been treated with Norvasc10 mg, ramipril 10 mg, Aldactone 25 mg and metoprolol, Followed by PCP also  He is also on Lasix from PCP     BP Readings from Last 3 Encounters:  11/12/20 140/80  08/17/20 118/80  08/05/20 138/78   Lab Results  Component Value Date   K 4.1 11/10/2020   Microalbumin is improving  Lab Results  Component Value Date   MICRALBCREAT 55.6 (H) 07/30/2020   MICRALBCREAT 98.1 (H) 09/09/2019   MICRALBCREAT 13.1 03/16/2018   MICRALBCREAT 21.1 18/84/1660     Complications: Microalbuminuria    Diabetic foot exam was done in 11/20 by his PCP  Last exam with eye doctor  was 4/20, has not been able to make appointment for follow-up        LABS:  Lab on 11/10/2020  Component Date Value Ref Range Status  . Cholesterol 11/10/2020 102  0 - 200 mg/dL Final   ATP III Classification       Desirable:  < 200 mg/dL               Borderline High:  200 - 239 mg/dL           High:  > = 240 mg/dL  . Triglycerides 11/10/2020 129.0  0.0 - 149.0 mg/dL Final   Normal:  <150 mg/dLBorderline High:  150 - 199 mg/dL  . HDL 11/10/2020 27.70* >39.00 mg/dL Final  . VLDL 11/10/2020 25.8  0.0 - 40.0 mg/dL Final  . LDL Cholesterol 11/10/2020 48  0 - 99 mg/dL Final  . Total CHOL/HDL Ratio 11/10/2020 4   Final                  Men          Women1/2 Average Risk     3.4          3.3Average Risk          5.0          4.42X Average Risk          9.6          7.13X Average Risk          15.0          11.0                      . NonHDL 11/10/2020 73.95   Final   NOTE:  Non-HDL goal should be 30 mg/dL higher than patient's LDL goal (i.e. LDL goal of < 70 mg/dL, would have non-HDL goal of < 100 mg/dL)  . Sodium 11/10/2020 134* 135 - 145 mEq/L Final  . Potassium 11/10/2020 4.1  3.5 - 5.1 mEq/L Final  . Chloride 11/10/2020 100  96 - 112 mEq/L Final  . CO2 11/10/2020 26  19 - 32 mEq/L Final  . Glucose, Bld 11/10/2020 244* 70 - 99 mg/dL Final  . BUN 11/10/2020 12  6 - 23 mg/dL Final  . Creatinine, Ser 11/10/2020 0.97  0.40 - 1.50 mg/dL Final  . Total Bilirubin 11/10/2020 1.0  0.2 - 1.2 mg/dL Final  . Alkaline Phosphatase 11/10/2020 114  39 -  117 U/L Final  . AST 11/10/2020 31  0 - 37 U/L Final  . ALT 11/10/2020 25  0 - 53 U/L Final  . Total Protein 11/10/2020 7.5  6.0 - 8.3 g/dL Final  . Albumin 11/10/2020 4.3  3.5 - 5.2 g/dL Final  . GFR 11/10/2020 76.60  >60.00 mL/min Final   Calculated using the CKD-EPI Creatinine Equation (2021)  . Calcium 11/10/2020 9.3  8.4 - 10.5 mg/dL Final  . Hgb A1c MFr Bld 11/10/2020 8.5* 4.6 - 6.5 % Final   Glycemic Control Guidelines for People with Diabetes:Non Diabetic:  <6%Goal of Therapy: <7%Additional Action Suggested:  >8%     Physical Examination:  BP 140/80 (Patient Position: Standing)   Pulse 96   Ht 6' (1.829 m)   Wt 219 lb 8 oz (99.6 kg)   SpO2 98%   BMI 29.77 kg/m      ASSESSMENT/PLAN  Diabetes type 2, On insulin   See history  of present illness for detailed discussion of current management, problems identified and blood sugar patterns.  A1c is now 8.5  He has been on basal bolus insulin and Metformin   As before he has difficulty following directions for glucose monitoring and mealtime insulin  Now taking mealtime insulin 1 to 2 hours after eating  Discussed that his postprandial reading in the office lab was 244  Also is taking in adequate doses in the morning  Not clear if he is taking the same dose of Tresiba consistently although his recent fasting readings are fairly good  Recently starting to do a little walking again  Plan:  Consider follow-up with diabetes educator referral again, he does not make an appointment when referrals are made  Make sure he takes his NOVOLOG before eating  Explained to him with the help of a patient handout how postprandial blood sugars are affected by food as well as needing to time the mealtime insulin consistently before eating  Also needs to take 20 units of NovoLog before each meal, this may be simpler for him to remember  Discussed blood sugar targets after meals and to start checking 2 hours after eating consistently  He will not adjust the dose of Tresiba unless morning sugars are consistently out of range of 90-130  Regular walking for exercise  Avoid high-fat meals  May benefit from continuous glucose monitoring but unlikely that he will be able to do this himself for follow instructions for monitoring especially with freestyle libre  HYPERTENSION: Blood pressure is fairly well controlled, higher today because he thinks he was rushing  Microalbumin has been mildly increased persistently  Lipid assessment: LDL excellent with statin treatment and he will continue  Overdue for eye exams: We will refer as he did not make an appointment  Follow-up in 3 months  Patient Instructions  Check blood sugars on waking up days a week  Also check blood  sugars about 2 hours after meals and do this after different meals by rotation  Recommended blood sugar levels on waking up are 90-130 and about 2 hours after meal is 130-160  Please bring your blood sugar monitor to each visit, thank you          [name] 11/12/2020, 12:47 PM   Note: This office note was prepared with Dragon voice recognition system technology. Any transcriptional errors that result from this process are unintentional.

## 2020-11-18 ENCOUNTER — Other Ambulatory Visit: Payer: Self-pay | Admitting: Family Medicine

## 2020-11-18 DIAGNOSIS — E1169 Type 2 diabetes mellitus with other specified complication: Secondary | ICD-10-CM

## 2020-12-13 ENCOUNTER — Other Ambulatory Visit: Payer: Self-pay | Admitting: Endocrinology

## 2020-12-15 ENCOUNTER — Telehealth: Payer: Self-pay | Admitting: Pharmacist

## 2020-12-15 ENCOUNTER — Ambulatory Visit: Payer: HMO | Admitting: Family Medicine

## 2020-12-15 NOTE — Chronic Care Management (AMB) (Signed)
Chronic Care Management Pharmacy Assistant   Name: Patrick Wilcox  MRN: [id number] DOB: [birth year]  Reason for Encounter: Disease State/ Diabetes Assessment Call.    Conditions to be addressed/monitored: DMII  Recent office visits:  None.   Recent consult visits:  11/12/20 [name] MD (Endocrinology) - seen for type 2 diabetes with hyperglycemia and other chronic conditions. No medication changes. Follow up in 3 months.   Hospital visits:  None in previous 6 months  Medications: Outpatient Encounter Medications as of 12/15/2020  Medication Sig   Alcohol Swabs (B-D SINGLE USE SWABS REGULAR) PADS Use as necessary   allopurinol (ZYLOPRIM) 300 MG tablet TAKE 1 TABLET BY MOUTH EVERY DAY   amLODipine (NORVASC) 10 MG tablet TAKE 1 TABLET BY MOUTH EVERY DAY   aspirin 81 MG tablet Take 81 mg by mouth daily.   atorvastatin (LIPITOR) 40 MG tablet Take 1 tablet (40 mg total) by mouth daily.   Blood Glucose Monitoring Suppl (ONETOUCH VERIO IQ SYSTEM) w/Device KIT USE TO CHECK BLOOD SUGAR TWICE A DAY DX CODE E11.9   colchicine 0.6 MG tablet Take 1 tablet (0.6 mg total) by mouth daily as needed.   EASY COMFORT PEN NEEDLES 31G X 5 MM MISC Used to check  Blood glucose 3 times daily or prn.   Fluocinolone Acetonide Body 0.01 % OIL Apply 1 application topically as directed.   furosemide (LASIX) 20 MG tablet TAKE 1 TABLET BY MOUTH EVERY DAY   metFORMIN (GLUCOPHAGE) 1000 MG tablet TAKE ONE TABLET BY MOUTH TWICE DAILY. TAKE WITH A MEAL.   metoprolol tartrate (LOPRESSOR) 100 MG tablet TAKE 1 TABLET BY MOUTH TWICE A DAY   NOVOLOG FLEXPEN 100 UNIT/ML FlexPen INJECT 20-22 UNITS INTO SKIN PRIOR TO BREAKFAST AND 20-22 UNITS PRIOR TO EVENING MEAL   nystatin-triamcinolone ointment (MYCOLOG) Daily as needed on affected area.   ONETOUCH DELICA LANCETS 83M MISC Use to check blood sugar twice daily. DX Code E11.9   ONETOUCH VERIO test strip USE AS INSTRUCTED   ramipril (ALTACE) 10 MG capsule Take 10 mg by  mouth daily.   sildenafil (VIAGRA) 50 MG tablet Take 1 tablet (50 mg total) by mouth daily as needed for erectile dysfunction.   spironolactone (ALDACTONE) 25 MG tablet TAKE 1 TABLET BY MOUTH EVERY DAY   TRESIBA FLEXTOUCH 100 UNIT/ML FlexTouch Pen INJECT 38 UNITS SUBCUTANEOUSLY ONCE DAILY   No facility-administered encounter medications on file as of 12/15/2020.    Recent Relevant Labs: Lab Results  Component Value Date/Time   HGBA1C 8.5 (H) 11/10/2020 08:57 AM   HGBA1C 7.9 (H) 07/30/2020 08:31 AM   MICROALBUR 84.7 (H) 07/30/2020 08:31 AM   MICROALBUR 148.4 (H) 09/09/2019 09:47 AM    Kidney Function Lab Results  Component Value Date/Time   CREATININE 0.97 11/10/2020 08:57 AM   CREATININE 0.90 07/30/2020 08:31 AM   GFR 76.60 11/10/2020 08:57 AM   GFRNONAA 121 09/19/2008 08:59 AM   GFRAA 147 09/19/2008 08:59 AM    Current antihyperglycemic regimen:  insulin aspart (Novolog Flexpen), inject 10-12 units at breakfast if needed and 12 units at suppertime  insulin degludec [name]), inject 40 units at suppertime (changing times)  metformin 1026m, 1 tablet twice daily  What recent interventions/DTPs have been made to improve glycemic control:  None.  Have there been any recent hospitalizations or ED visits since last visit with CPP? No Patient denies hypoglycemic symptoms. Patient denies hyperglycemic symptoms. How often are you checking your blood sugar? once daily What are  your blood sugars ranging?  Fasting: tends to run 11/23/20 109, 11/14/20 89, 11/25/20 120, 11/26/20 84. 11/30/20 95, 12/01/20 90, 12/02/20 124, 12/03/20 120, 12/07/20 109, 12/08/20 114, 12/09/20 120, 12/15/20 131, 12/16/20 115, today 118.  Before meals: None. After meals: None.  Bedtime: None.  During the week, how often does your blood glucose drop below 70? Never Are you checking your feet daily/regularly? Yes. Patient goes and gets spa pedicures to take care of his feet and get them clipped and massaged.    Adherence Review: Is the patient currently on a STATIN medication? Yes Is the patient currently on ACE/ARB medication? No Does the patient have >5 day gap between last estimated fill dates? No  Notes: Spoke with patient and reviewed medications as listed. Patient reports taking all medications and no issues at this time. Patient checks his blood sugar daily and states it is usually always normal as listed above. Patient states he eats pretty healthy and good combination of foods. Patient states he gets a little aggravated having to do his PCP appointments and the clinical pharmacist calls and appointments because he does not like getting a lot of calls. Patient sated he is able to get plenty of activity and he goes to church every Sunday to get out of the house. Patient thanked me for my call.   Star Rating Drugs:  Atorvastatin 77m - last filled on 10/18/20 [facility] Met[facility] 10037m- [facility] [phone]

## 2020-12-16 ENCOUNTER — Encounter: Payer: Self-pay | Admitting: Family Medicine

## 2020-12-16 ENCOUNTER — Other Ambulatory Visit: Payer: Self-pay

## 2020-12-16 ENCOUNTER — Ambulatory Visit (INDEPENDENT_AMBULATORY_CARE_PROVIDER_SITE_OTHER): Payer: HMO | Admitting: Family Medicine

## 2020-12-16 VITALS — BP 138/70 | HR 69 | Resp 16 | Ht 72.0 in | Wt 219.2 lb

## 2020-12-16 DIAGNOSIS — M1A9XX0 Chronic gout, unspecified, without tophus (tophi): Secondary | ICD-10-CM

## 2020-12-16 DIAGNOSIS — D3610 Benign neoplasm of peripheral nerves and autonomic nervous system, unspecified: Secondary | ICD-10-CM

## 2020-12-16 DIAGNOSIS — R21 Rash and other nonspecific skin eruption: Secondary | ICD-10-CM

## 2020-12-16 DIAGNOSIS — I10 Essential (primary) hypertension: Secondary | ICD-10-CM | POA: Diagnosis not present

## 2020-12-16 DIAGNOSIS — I5032 Chronic diastolic (congestive) heart failure: Secondary | ICD-10-CM | POA: Diagnosis not present

## 2020-12-16 DIAGNOSIS — I5030 Unspecified diastolic (congestive) heart failure: Secondary | ICD-10-CM | POA: Insufficient documentation

## 2020-12-16 MED ORDER — SPIRONOLACTONE 25 MG PO TABS: 1.0000 | ORAL_TABLET | Freq: Every day | ORAL | 2 refills | Status: DC

## 2020-12-16 MED ORDER — NYSTATIN-TRIAMCINOLONE 100000-0.1 UNIT/GM-% EX OINT: TOPICAL_OINTMENT | CUTANEOUS | 1 refills | Status: DC

## 2020-12-16 MED ORDER — COLCHICINE 0.6 MG PO TABS: 0.6000 mg | ORAL_TABLET | Freq: Every day | ORAL | 1 refills | Status: DC | PRN

## 2020-12-16 MED ORDER — FUROSEMIDE 20 MG PO TABS
20.0000 mg | ORAL_TABLET | Freq: Every day | ORAL | 1 refills | Status: DC | PRN
Start: 2020-12-16 — End: 2021-09-14

## 2020-12-16 NOTE — Patient Instructions (Addendum)
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 Do not use My Chart to request refills or for acute issues that need immediate attention.   Furosemide seems to be for leg swelling, so take it daily as needed. Colchicine as needed when you have a gout attack and for a week. When you take colchicine hold on the cholesterol mediation.  Please be sure medication list is accurate. If a new problem present, please set up appointment sooner than planned today.

## 2020-12-16 NOTE — Progress Notes (Addendum)
HPI: Patrick Wilcox is a 75 y.o. male, who is here today for follow up.   He was last seen on 08/17/20. No new problems since his last visit.  He has a lesion on upper back he would like removed. He has had lesion for years, slow growth, it is not tender but can be it is more noticeable through his clothes.  He is also requesting refills of some of his meds.  Rash: Involving abdomen,lower chest ,and back. Problem has been going on for over a year.  Mildly pruritic, scaly, non erythematous. He has seen dermatologist. According to pt, treatment made problem worse. He is on Nystatin-Triamcinolone cream, which has helped, so he would like to continue it. No new associated symptoms.  Gout: He has not had a gout attack in 6-7 years. He is on Allopurinol 300 mg daily. He is taking Colchicine 0.6 mg daily.  Lab Results  Component Value Date   LABURIC 4.4 09/09/2019   HTN: He is on Spironolactone 25 mg daily,Ramipril 10 mg daily,Metoprolol tartrate 100 mg bid,and Amlodipine 10 mg daily. Negative for severe/frequent headache, visual changes, chest pain, dyspnea, palpitation,focal weakness, or edema.  He is not sure about indication for Furosemide 20 mg  daily, he thinks it is for LE edema. Negative for orthopnea or PND. Echo 06/09/20 LVEF 32-95%, grade I diastolic dysfunction.  Lab Results  Component Value Date   CREATININE 0.97 11/10/2020   BUN 12 11/10/2020   NA 134 (L) 11/10/2020   K 4.1 11/10/2020   CL 100 11/10/2020   CO2 26 11/10/2020   Review of Systems  Constitutional:  Negative for activity change, appetite change and fever.  HENT:  Negative for nosebleeds, sore throat and trouble swallowing.   Respiratory:  Negative for cough and wheezing.   Gastrointestinal:  Negative for abdominal pain, nausea and vomiting.  Endocrine: Negative for cold intolerance and heat intolerance.  Genitourinary:  Negative for decreased urine volume, dysuria and hematuria.  Skin:   Negative for wound.  Neurological:  Negative for syncope and facial asymmetry.  Psychiatric/Behavioral:  Negative for confusion.   Rest of ROS, see pertinent positives sand negatives in HPI  Current Outpatient Medications on File Prior to Visit  Medication Sig Dispense Refill   Alcohol Swabs (B-D SINGLE USE SWABS REGULAR) PADS Use as necessary 300 each 6   allopurinol (ZYLOPRIM) 300 MG tablet TAKE 1 TABLET BY MOUTH EVERY DAY 90 tablet 1   amLODipine (NORVASC) 10 MG tablet TAKE 1 TABLET BY MOUTH EVERY DAY 90 tablet 1   aspirin 81 MG tablet Take 81 mg by mouth daily.     atorvastatin (LIPITOR) 40 MG tablet Take 1 tablet (40 mg total) by mouth daily. 90 tablet 3   Blood Glucose Monitoring Suppl (ONETOUCH VERIO IQ SYSTEM) w/Device KIT USE TO CHECK BLOOD SUGAR TWICE A DAY DX CODE E11.9 1 kit 2   EASY COMFORT PEN NEEDLES 31G X 5 MM MISC Used to check  Blood glucose 3 times daily or prn. 100 each 3   Fluocinolone Acetonide Body 0.01 % OIL Apply 1 application topically as directed.     metFORMIN (GLUCOPHAGE) 1000 MG tablet TAKE ONE TABLET BY MOUTH TWICE DAILY. TAKE WITH A MEAL. 180 tablet 1   metoprolol tartrate (LOPRESSOR) 100 MG tablet TAKE 1 TABLET BY MOUTH TWICE A DAY 180 tablet 2   NOVOLOG FLEXPEN 100 UNIT/ML FlexPen INJECT 20-22 UNITS INTO SKIN PRIOR TO BREAKFAST AND 20-22 UNITS PRIOR TO EVENING MEAL  15 mL 3   ONETOUCH DELICA LANCETS 81O MISC Use to check blood sugar twice daily. DX Code E11.9 200 each 1   ONETOUCH VERIO test strip USE AS INSTRUCTED 200 strip 1   ramipril (ALTACE) 10 MG capsule Take 10 mg by mouth daily.     sildenafil (VIAGRA) 50 MG tablet Take 1 tablet (50 mg total) by mouth daily as needed for erectile dysfunction. 90 tablet 0   TRESIBA FLEXTOUCH 100 UNIT/ML FlexTouch Pen INJECT 38 UNITS SUBCUTANEOUSLY ONCE DAILY 15 mL 1   No current facility-administered medications on file prior to visit.   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   PNEUMONIA, LEFT LOWER LOBE 09/08/2009   Rash    No Known Allergies  Social History   Socioeconomic History   Marital status: Widowed    Spouse name: Not on file   Number of children: 2   Years of education: Not on file   Highest education level: Not on file  Occupational History    Comment: Retired from [employer] Use   Smoking status: Never   Smokeless tobacco: Never  Vaping Use   Vaping Use: Never used  Substance and Sexual Activity   Alcohol use: No    Comment: stopped drinking etoh completely in 10-08-1999   Drug use: No   Sexual activity: Yes    Comment: uses viagra  Other Topics Concern   Not on file  Social History Narrative   Lives alone   Retired from [employer] in 10-08-1999   Twin sons, one son died of a stroke this year 10-07-2018) at age 59, his living son resides in [place]    His wife died of breast cancer 11 years ago    Social Determinants of Radio broadcast assistant Strain: Low Risk    Difficulty of Paying Living Expenses: Not very hard  Food Insecurity: No Food Insecurity   Worried About Charity fundraiser in the Last Year: Never true   Arboriculturist in the Last Year: Never true  Transportation Needs: No Transportation Needs   Lack of Transportation (Medical): No   Lack of Transportation (Non-Medical): No  Physical Activity: Not on file  Stress: Not on file  Social Connections: Not on file    Vitals:   12/16/20 1114  BP: 138/70  Pulse: 69  Resp: 16  SpO2: 97%   Wt Readings from Last 3 Encounters:  12/16/20 219 lb 4 oz (99.5 kg)  11/12/20 219 lb 8 oz (99.6 kg)  08/17/20 221 lb (100.2 kg)    Body mass index is 29.74 kg/m.  Physical Exam Vitals and nursing note reviewed.  Constitutional:      General: He is not in acute distress.     Appearance: He is well-developed.  HENT:     Head: Normocephalic and atraumatic.     Mouth/Throat:     Mouth: Mucous membranes are moist.     Pharynx: Oropharynx is clear.  Eyes:     Conjunctiva/sclera: Conjunctivae normal.  Cardiovascular:     Rate and Rhythm: Normal rate and regular rhythm.     Pulses:          Dorsalis pedis pulses are 2+ on the right side and  2+ on the left side.     Heart sounds: No murmur heard. Pulmonary:     Effort: Pulmonary effort is normal. No respiratory distress.     Breath sounds: Normal breath sounds.  Abdominal:     Palpations: Abdomen is soft. There is no hepatomegaly or mass.     Tenderness: There is no abdominal tenderness.  Lymphadenopathy:     Cervical: No cervical adenopathy.  Skin:    General: Skin is warm.     Findings: Lesion present. No erythema.          Comments: Right side of abdomen with hyperpigmented confluent, macular lesions, no erythema or scaly lesions.  Neurological:     Mental Status: He is alert and oriented to person, place, and time.     Cranial Nerves: No cranial nerve deficit.     Gait: Gait normal.  Psychiatric:     Comments: Well groomed, good eye contact.   ASSESSMENT AND PLAN:  Patrick Wilcox was seen today for 6 months follow-up.  Orders Placed This Encounter  Procedures   Ambulatory referral to General Surgery   Chronic gout without tophus, unspecified cause, unspecified site Well controlled. Low purine diet to continue. No changes in Allopurinol dose. Colchicine 0.6 to take if acute episode.  -     colchicine 0.6 MG tablet; Take 1 tablet (0.6 mg total) by mouth daily as needed (for gout attack.).  Essential hypertension BP adequately controlled. Continue Spironolactone,Metoprolol tartrate,Amlodipine,and Ramipril same dose. Low salt diet.  -     spironolactone (ALDACTONE) 25 MG tablet; Take 1 tablet (25 mg total) by mouth daily. For blood pressure.  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  Skin rash Unknown etiology. Nystatin-Triamcinolone helps, so no changes. He does not want to follow with derma for now.  -     nystatin-triamcinolone ointment (MYCOLOG); Daily as needed on affected area.  Chronic heart failure with preserved ejection fraction (HCC) Asymptomatic. Continue low salt diet. On Metoprolol and Ramipril. Continue Furosemide daily as needed.  -     furosemide (LASIX) 20 MG tablet; Take 1 tablet (20 mg total) by mouth daily as needed for edema.   Return in about 6 months (around 06/17/2021) for HTN.   Patrick Hetz G. [name], MD  [facility]. [place] office.   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 Do not use My Chart to request refills or for acute issues that need immediate attention.   Furosemide seems to be for leg swelling, so take it daily as needed. Colchicine as needed when you have a gout attack and for a week. When you take colchicine hold on the cholesterol mediation.  Please be sure medication list is accurate. If a new problem present, please set up appointment sooner than planned today.

## 2020-12-20 ENCOUNTER — Encounter: Payer: Self-pay | Admitting: Family Medicine

## 2021-01-02 ENCOUNTER — Other Ambulatory Visit: Payer: Self-pay | Admitting: Family Medicine

## 2021-01-02 DIAGNOSIS — E1169 Type 2 diabetes mellitus with other specified complication: Secondary | ICD-10-CM

## 2021-01-20 ENCOUNTER — Ambulatory Visit (INDEPENDENT_AMBULATORY_CARE_PROVIDER_SITE_OTHER): Payer: HMO

## 2021-01-20 ENCOUNTER — Other Ambulatory Visit: Payer: Self-pay

## 2021-01-20 DIAGNOSIS — Z1211 Encounter for screening for malignant neoplasm of colon: Secondary | ICD-10-CM

## 2021-01-20 DIAGNOSIS — Z0000 Encounter for general adult medical examination without abnormal findings: Secondary | ICD-10-CM | POA: Diagnosis not present

## 2021-01-20 NOTE — Patient Instructions (Signed)
Patrick Wilcox , Thank you for taking time to come for your Medicare Wellness Visit. I appreciate your ongoing commitment to your health goals. Please review the following plan we discussed and let me know if I can assist you in the future.   Screening recommendations/referrals: Colonoscopy: order placed 01/20/21 Recommended yearly ophthalmology/optometry visit for glaucoma screening and checkup Recommended yearly dental visit for hygiene and checkup  Vaccinations: Influenza vaccine: Due 02/08/21 Pneumococcal vaccine: Completed  Tdap vaccine: Done 08/02/13 repeat in 10 years 08/03/23 Shingles vaccine: Completed 07/22/19 & 09/08/20 Covid-19: Completed 3/4, 3/26, &06/20/20  Advanced directives: Please bring a copy of your health care power of attorney and living will to the office at your convenience.  Conditions/risks identified: exercise more   Next appointment: Follow up in one year for your annual wellness visit.   Preventive Care 20 Years and Older, Male Preventive care refers to lifestyle choices and visits with your health care provider that can promote health and wellness. What does preventive care include? A yearly physical exam. This is also called an annual well check. Dental exams once or twice a year. Routine eye exams. Ask your health care provider how often you should have your eyes checked. Personal lifestyle choices, including: Daily care of your teeth and gums. Regular physical activity. Eating a healthy diet. Avoiding tobacco and drug use. Limiting alcohol use. Practicing safe sex. Taking low doses of aspirin every day. Taking vitamin and mineral supplements as recommended by your health care provider. What happens during an annual well check? The services and screenings done by your health care provider during your annual well check will depend on your age, overall health, lifestyle risk factors, and family history of disease. Counseling  Your health care provider may ask  you questions about your: Alcohol use. Tobacco use. Drug use. Emotional well-being. Home and relationship well-being. Sexual activity. Eating habits. History of falls. Memory and ability to understand (cognition). Work and work Statistician. Screening  You may have the following tests or measurements: Height, weight, and BMI. Blood pressure. Lipid and cholesterol levels. These may be checked every 5 years, or more frequently if you are over 79 years old. Skin check. Lung cancer screening. You may have this screening every year starting at age 36 if you have a 30-pack-year history of smoking and currently smoke or have quit within the past 15 years. Fecal occult blood test (FOBT) of the stool. You may have this test every year starting at age 62. Flexible sigmoidoscopy or colonoscopy. You may have a sigmoidoscopy every 5 years or a colonoscopy every 10 years starting at age 64. Prostate cancer screening. Recommendations will vary depending on your family history and other risks. Hepatitis C blood test. Hepatitis B blood test. Sexually transmitted disease (STD) testing. Diabetes screening. This is done by checking your blood sugar (glucose) after you have not eaten for a while (fasting). You may have this done every 1-3 years. Abdominal aortic aneurysm (AAA) screening. You may need this if you are a current or former smoker. Osteoporosis. You may be screened starting at age 8 if you are at high risk. Talk with your health care provider about your test results, treatment options, and if necessary, the need for more tests. Vaccines  Your health care provider may recommend certain vaccines, such as: Influenza vaccine. This is recommended every year. Tetanus, diphtheria, and acellular pertussis (Tdap, Td) vaccine. You may need a Td booster every 10 years. Zoster vaccine. You may need this after age 7.  Pneumococcal 13-valent conjugate (PCV13) vaccine. One dose is recommended after age  56. Pneumococcal polysaccharide (PPSV23) vaccine. One dose is recommended after age 51. Talk to your health care provider about which screenings and vaccines you need and how often you need them. This information is not intended to replace advice given to you by your health care provider. Make sure you discuss any questions you have with your health care provider. Document Released: 07/24/2015 Document Revised: 03/16/2016 Document Reviewed: 04/28/2015 Elsevier Interactive Patient Education  2017 [place] Prevention in the Home Falls can cause injuries. They can happen to people of all ages. There are many things you can do to make your home safe and to help prevent falls. What can I do on the outside of my home? Regularly fix the edges of walkways and driveways and fix any cracks. Remove anything that might make you trip as you walk through a door, such as a raised step or threshold. Trim any bushes or trees on the path to your home. Use bright outdoor lighting. Clear any walking paths of anything that might make someone trip, such as rocks or tools. Regularly check to see if handrails are loose or broken. Make sure that both sides of any steps have handrails. Any raised decks and porches should have guardrails on the edges. Have any leaves, snow, or ice cleared regularly. Use sand or salt on walking paths during winter. Clean up any spills in your garage right away. This includes oil or grease spills. What can I do in the bathroom? Use night lights. Install grab bars by the toilet and in the tub and shower. Do not use towel bars as grab bars. Use non-skid mats or decals in the tub or shower. If you need to sit down in the shower, use a plastic, non-slip stool. Keep the floor dry. Clean up any water that spills on the floor as soon as it happens. Remove soap buildup in the tub or shower regularly. Attach bath mats securely with double-sided non-slip rug tape. Do not have throw  rugs and other things on the floor that can make you trip. What can I do in the bedroom? Use night lights. Make sure that you have a light by your bed that is easy to reach. Do not use any sheets or blankets that are too big for your bed. They should not hang down onto the floor. Have a firm chair that has side arms. You can use this for support while you get dressed. Do not have throw rugs and other things on the floor that can make you trip. What can I do in the kitchen? Clean up any spills right away. Avoid walking on wet floors. Keep items that you use a lot in easy-to-reach places. If you need to reach something above you, use a strong step stool that has a grab bar. Keep electrical cords out of the way. Do not use floor polish or wax that makes floors slippery. If you must use wax, use non-skid floor wax. Do not have throw rugs and other things on the floor that can make you trip. What can I do with my stairs? Do not leave any items on the stairs. Make sure that there are handrails on both sides of the stairs and use them. Fix handrails that are broken or loose. Make sure that handrails are as long as the stairways. Check any carpeting to make sure that it is firmly attached to the stairs. Fix any  carpet that is loose or worn. Avoid having throw rugs at the top or bottom of the stairs. If you do have throw rugs, attach them to the floor with carpet tape. Make sure that you have a light switch at the top of the stairs and the bottom of the stairs. If you do not have them, ask someone to add them for you. What else can I do to help prevent falls? Wear shoes that: Do not have high heels. Have rubber bottoms. Are comfortable and fit you well. Are closed at the toe. Do not wear sandals. If you use a stepladder: Make sure that it is fully opened. Do not climb a closed stepladder. Make sure that both sides of the stepladder are locked into place. Ask someone to hold it for you, if  possible. Clearly mark and make sure that you can see: Any grab bars or handrails. First and last steps. Where the edge of each step is. Use tools that help you move around (mobility aids) if they are needed. These include: Canes. Walkers. Scooters. Crutches. Turn on the lights when you go into a dark area. Replace any light bulbs as soon as they burn out. Set up your furniture so you have a clear path. Avoid moving your furniture around. If any of your floors are uneven, fix them. If there are any pets around you, be aware of where they are. Review your medicines with your doctor. Some medicines can make you feel dizzy. This can increase your chance of falling. Ask your doctor what other things that you can do to help prevent falls. This information is not intended to replace advice given to you by your health care provider. Make sure you discuss any questions you have with your health care provider. Document Released: 04/23/2009 Document Revised: 12/03/2015 Document Reviewed: 08/01/2014 Elsevier Interactive Patient Education  2017 [employer].

## 2021-01-20 NOTE — Progress Notes (Signed)
Virtual Visit via Telephone Note  I connected with  Patrick Wilcox on 01/20/21 at 11:00 AM EDT by telephone and verified that I am speaking with the correct person using two identifiers.  Medicare Annual Wellness visit completed telephonically due to Covid-19 pandemic.   Persons participating in this call: This Health Coach and this patient.   Location: Patient: Home Provider: Office   I discussed the limitations, risks, security and privacy concerns of performing an evaluation and management service by telephone and the availability of in person appointments. The patient expressed understanding and agreed to proceed.  Unable to perform video visit due to video visit attempted and failed and/or patient does not have video capability.   Some vital signs may be absent or patient reported.   Willette Brace, LPN   Subjective:   Patrick Wilcox is a 75 y.o. male who presents for Medicare Annual/Subsequent preventive examination.  Review of Systems     Cardiac Risk Factors include: advanced age (>33mn, >>19women);diabetes mellitus;dyslipidemia;male gender;hypertension     Objective:    There were no vitals filed for this visit. There is no height or weight on file to calculate BMI.  Advanced Directives 07/08/2019 02/06/2019 01/01/2016  Does Patient Have a Medical Advance Directive? Yes No No  [place] - -  Does patient want to make changes to medical advance directive? No - Patient declined - -  Would patient like information on creating a medical advance directive? - Yes (MAU/Ambulatory/Procedural Areas - Information given) Yes - Educational materials given    Current Medications (verified) Outpatient Encounter Medications as of 01/20/2021  Medication Sig   Alcohol Swabs (B-D SINGLE USE SWABS REGULAR) PADS Use as necessary   allopurinol (ZYLOPRIM) 300 MG tablet TAKE 1 TABLET BY MOUTH EVERY DAY   amLODipine (NORVASC)  10 MG tablet TAKE 1 TABLET BY MOUTH EVERY DAY   aspirin 81 MG tablet Take 81 mg by mouth daily.   atorvastatin (LIPITOR) 40 MG tablet Take 1 tablet (40 mg total) by mouth daily.   Blood Glucose Monitoring Suppl (ONETOUCH VERIO IQ SYSTEM) w/Device KIT USE TO CHECK BLOOD SUGAR TWICE A DAY DX CODE E11.9   colchicine 0.6 MG tablet Take 1 tablet (0.6 mg total) by mouth daily as needed (for gout attack.).   EASY COMFORT PEN NEEDLES 31G X 5 MM MISC Used to check  Blood glucose 3 times daily or prn.   Fluocinolone Acetonide Body 0.01 % OIL Apply 1 application topically as directed.   furosemide (LASIX) 20 MG tablet Take 1 tablet (20 mg total) by mouth daily as needed for edema.   metFORMIN (GLUCOPHAGE) 1000 MG tablet TAKE ONE TABLET BY MOUTH TWICE DAILY. TAKE WITH A MEAL.   metoprolol tartrate (LOPRESSOR) 100 MG tablet TAKE 1 TABLET BY MOUTH TWICE A DAY   NOVOLOG FLEXPEN 100 UNIT/ML FlexPen INJECT 20-22 UNITS INTO SKIN PRIOR TO BREAKFAST AND 20-22 UNITS PRIOR TO EVENING MEAL   nystatin-triamcinolone ointment (MYCOLOG) Daily as needed on affected area.   ONETOUCH DELICA LANCETS 333IMISC Use to check blood sugar twice daily. DX Code E11.9   ONETOUCH VERIO test strip USE AS INSTRUCTED   ramipril (ALTACE) 10 MG capsule Take 10 mg by mouth daily.   sildenafil (VIAGRA) 50 MG tablet Take 1 tablet (50 mg total) by mouth daily as needed for erectile dysfunction.   spironolactone (ALDACTONE) 25 MG tablet Take 1 tablet (25 mg total) by mouth daily. For  blood pressure.   TRESIBA FLEXTOUCH 100 UNIT/ML FlexTouch Pen INJECT 38 UNITS SUBCUTANEOUSLY ONCE DAILY   No facility-administered encounter medications on file as of 01/20/2021.    Allergies (verified) Patient has no known allergies.   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   PNEUMONIA, LEFT LOWER LOBE 09/08/2009   Rash    Past Surgical History:  Procedure Laterality Date   CORONARY ANGIOPLASTY WITH STENT PLACEMENT     CORONARY ARTERY BYPASS GRAFT  09/12/2001   Family History  Problem Relation Age of Onset   Heart disease Mother    Heart disease Father    Diabetes Sister    Diabetes Brother    Social History   Socioeconomic History   Marital status: Widowed    Spouse name: Not on file   Number of children: 2   Years of education: Not on file   Highest education level: Not on file  Occupational History    Comment: Retired from [employer] Use   Smoking status: Never   Smokeless tobacco: Never  Vaping Use   Vaping Use: Never used  Substance and Sexual Activity   Alcohol use: No    Comment: stopped drinking etoh completely in 13-Sep-1999   Drug use: No   Sexual activity: Yes    Comment: uses viagra  Other Topics Concern   Not on file  Social History Narrative   Lives alone   Retired from [employer] in 09-13-99   Twin sons, one son died of a stroke this year Sep 12, 2018) at age 84, his living son resides in [place]    His wife died of breast cancer 11 years ago    Social Determinants of Radio broadcast assistant Strain: Low Risk    Difficulty of Paying Living Expenses: Not hard at all  Food Insecurity: No Food Insecurity   Worried About Charity fundraiser in the Last Year: Never true   Arboriculturist in the Last Year: Never true  Transportation Needs: No Transportation Needs   Lack of Transportation (Medical): No   Lack of Transportation (Non-Medical): No  Physical Activity: Sufficiently Active   Days of Exercise per Week: 5 days   Minutes of Exercise per Session: 50 min  Stress: No Stress Concern Present   Feeling of Stress : Not at all  Social Connections: Moderately Integrated   Frequency of Communication with Friends and Family: Twice a week   Frequency of Social  Gatherings with Friends and Family: More than three times a week   Attends Religious Services: More than 4 times per year   Active Member of [employer] or Organizations: Yes   Attends Archivist Meetings: 1 to 4 times per year   Marital Status: Widowed    Tobacco Counseling Counseling given: Not Answered   Clinical Intake:  Pre-visit preparation completed: Yes  Pain : No/denies pain     BMI - recorded: 29.74 Nutritional Status: BMI 25 -29 Overweight Nutritional Risks: None Diabetes: Yes CBG done?: Yes (120) CBG resulted in Enter/ Edit results?: No Did pt. bring in CBG monitor from home?: No  How often do you need to have someone help you when you read instructions, pamphlets, or other written materials from your  doctor or pharmacy?: 1 - Never  Diabetic?Nutrition Risk Assessment:  Has the patient had any N/V/D within the last 2 months?  No  Does the patient have any non-healing wounds?  No  Has the patient had any unintentional weight loss or weight gain?  No   Diabetes:  Is the patient diabetic?  Yes  If diabetic, was a CBG obtained today?  Yes  Did the patient bring in their glucometer from home?  No  How often do you monitor your CBG's? Daily.   Financial Strains and Diabetes Management:  Are you having any financial strains with the device, your supplies or your medication? No .  Does the patient want to be seen by Chronic Care Management for management of their diabetes?  No  Would the patient like to be referred to a Nutritionist or for Diabetic Management?  No   Diabetic Exams:  Diabetic Eye Exam: Overdue for diabetic eye exam. Pt has been advised about the importance in completing this exam. Patient advised to call and schedule an eye exam. Diabetic Foot Exam: Overdue, Pt has been advised about the importance in completing this exam. Pt is scheduled for diabetic foot exam on None appt .   Interpreter Needed?: No  Information entered by :: Charlott Rakes, LPN   Activities of Daily Living In your present state of health, do you have any difficulty performing the following activities: 01/20/2021 06/26/2020  Hearing? N N  Vision? N N  Difficulty concentrating or making decisions? N N  Walking or climbing stairs? N N  Dressing or bathing? N N  Doing errands, shopping? N N  Preparing Food and eating ? N N  Using the Toilet? N N  In the past six months, have you accidently leaked urine? N N  Do you have problems with loss of bowel control? N N  Managing your Medications? N N  Managing your Finances? N N  Housekeeping or managing your Housekeeping? N N  Some recent data might be hidden    Patient Care Team: [name], [name], MD as PCP - General (Family Medicine) [name], MD as PCP - Electrophysiology (Cardiology) [name], MD as Consulting Physician (Endocrinology) [name], [facility] as Pharmacist (Pharmacist)  Indicate any recent Medical Services you may have received from other than [facility] providers in the past year (date may be approximate).     Assessment:   This is a routine wellness examination for Patrick Wilcox.  Hearing/Vision screen Hearing Screening - Comments:: Pt denies any hearing issues  Vision Screening - Comments:: Pt will follow up with provider for annual eye exams   Dietary issues and exercise activities discussed: Current Exercise Habits: Home exercise routine, Type of exercise: walking;Other - see comments, Time (Minutes): 45, Frequency (Times/Week): 5, Weekly Exercise (Minutes/Week): 225   Goals Addressed             This Visit's Progress    exercise more         Depression Screen PHQ 2/9 Scores 01/20/2021 12/16/2020 01/01/2020 05/15/2019 02/06/2019 01/01/2018 01/01/2016  PHQ - 2 Score 0 0 0 0 0 0 0    Fall Risk Fall Risk  01/20/2021 12/16/2020 02/04/2020 05/15/2019 02/07/2019  Falls in the past year? 0 0 0 0 (No Data)  Comment - - Emmi Telephone Survey: data to providers prior to load -  Emmi Telephone Survey: data to providers prior to load  Number falls in past yr: 0 0 - 0 (No Data)  Comment - - - -  Emmi Telephone Survey Actual Response =   Injury with Fall? 0 0 - 0 -  Follow up Falls prevention discussed Education provided - Education provided -    FALL RISK PREVENTION PERTAINING TO THE HOME:  Any stairs in or around the home? Yes  If so, are there any without handrails? No  Home free of loose throw rugs in walkways, pet beds, electrical cords, etc? Yes  Adequate lighting in your home to reduce risk of falls? Yes   ASSISTIVE DEVICES UTILIZED TO PREVENT FALLS:  Life alert? No  Use of a cane, walker or w/c? No  Grab bars in the bathroom? Yes  Shower chair or bench in shower? No  Elevated toilet seat or a handicapped toilet? No   TIMED UP AND GO:  Was the test performed? No     Cognitive Function:     6CIT Screen 01/20/2021  What Year? 4 points  What month? 0 points  What time? 0 points  Count back from 20 0 points  Months in reverse 4 points  Repeat phrase 6 points  Total Score 14    Immunizations Immunization History  Administered Date(s) Administered   Fluad Quad(high Dose 65+) 05/15/2019, 04/08/2020   Influenza Split 04/13/2011, 05/31/2012   Influenza Whole 05/21/2007, 06/22/2009, 06/15/2010   Influenza, High Dose Seasonal PF 05/02/2013, 03/31/2016, 06/18/2018   Influenza,inj,Quad PF,6+ Mos 04/02/2014, 03/30/2015, 06/08/2017   PFIZER(Purple Top)SARS-COV-2 Vaccination 09/12/2019, 10/04/2019, 06/20/2020   Pneumococcal Conjugate-13 08/26/2015   Pneumococcal Polysaccharide-23 05/02/2013   Tetanus 08/02/2013   Zoster Recombinat (Shingrix) 06/20/2020, 09/08/2020    TDAP status: Up to date  Flu Vaccine status: Up to date  Pneumococcal vaccine status: Up to date  Covid-19 vaccine status: Completed vaccines  Qualifies for Shingles Vaccine? Yes   Zostavax completed Yes   Shingrix Completed?: Yes  Screening Tests Health Maintenance  Topic  Date Due   COLONOSCOPY (Pts 45-56yr Insurance coverage will need to be confirmed)  11/20/2018   OPHTHALMOLOGY EXAM  10/12/2019   FOOT EXAM  05/14/2020   COVID-19 Vaccine (4 - Booster for Pfizer series) 09/18/2020   INFLUENZA VACCINE  02/08/2021   HEMOGLOBIN A1C  05/13/2021   TETANUS/TDAP  08/03/2023   Hepatitis C Screening  Completed   PNA vac Low Risk Adult  Completed   Zoster Vaccines- Shingrix  Completed   HPV VACCINES  Aged Out    Health Maintenance  Health Maintenance Due  Topic Date Due   COLONOSCOPY (Pts 45-45yrInsurance coverage will need to be confirmed)  11/20/2018   OPHTHALMOLOGY EXAM  10/12/2019   FOOT EXAM  05/14/2020   COVID-19 Vaccine (4 - Booster for Pf[place] Vieweries) 09/18/2020    Colorectal cancer screening: Referral to GI placed 01/20/21. Pt aware the office will call re: appt.   Additional Screening:  Hepatitis C Screening:  Completed 09/09/19  Vision Screening: Recommended annual ophthalmology exams for early detection of glaucoma and other disorders of the eye. Is the patient up to date with their annual eye exam?  No  Who is the provider or what is the name of the office in which the patient attends annual eye exams? Pt will find provider  If pt is not established with a provider, would they like to be referred to a provider to establish care? Yes .   Dental Screening: Recommended annual dental exams for proper oral hygiene  Community Resource Referral / Chronic Care Management: CRR required this visit?  No   CCM required this visit?  No  Plan:     I have personally reviewed and noted the following in the patient's chart:   Medical and social history Use of alcohol, tobacco or illicit drugs  Current medications and supplements including opioid prescriptions. Patient is not currently taking opioid prescriptions. Functional ability and status Nutritional status Physical activity Advanced directives List of other  physicians Hospitalizations, surgeries, and ER visits in previous 12 months Vitals Screenings to include cognitive, depression, and falls Referrals and appointments  In addition, I have reviewed and discussed with patient certain preventive protocols, quality metrics, and best practice recommendations. A written personalized care plan for preventive services as well as general preventive health recommendations were provided to patient.     Willette Brace, LPN   01/16/6437   Nurse Notes: Pt stated that he was expecting a call from a provider concerning the growth on his right shoulder blade which causes pain at times and has not received a follow up. Please advise

## 2021-02-08 ENCOUNTER — Telehealth: Payer: Self-pay | Admitting: Pharmacist

## 2021-02-08 NOTE — Chronic Care Management (AMB) (Signed)
    Chronic Care Management Pharmacy Assistant   Name: Patrick Wilcox  MRN: [id number] DOB: 23-Sep-1945   02-08-2021- Patient called to remind of appointment with Jeni Salles Clinical Pharmacist on 02-09-2021  No answer,  unable to leave message of appointment date, time and type of appointment no voice machine.   Care Gaps:  AWV- Scheduled for 02-02-2022 at 11 am  Colonoscopy - Overdue Foot Exam - Overdue COVID Booster #4 Pfizer - Overdue Flu Vaccine - Overdue  Star Rating Drug:  Atorvastatin $RemoveBefor'40mg'[id number]$  - last filled on 02-04-2021 [facility]DS at [facility] Metformin $RemoveBefor'1000mg'[id number]$  - last filled on 02-04-2021 90DS at [facility]   Medications: Outpatient Encounter Medications as of 02/08/2021  Medication Sig   Alcohol Swabs (B-D SINGLE USE SWABS REGULAR) PADS Use as necessary   allopurinol (ZYLOPRIM) 300 MG tablet TAKE 1 TABLET BY MOUTH EVERY DAY   amLODipine (NORVASC) 10 MG tablet TAKE 1 TABLET BY MOUTH EVERY DAY   aspirin 81 MG tablet Take 81 mg by mouth daily.   atorvastatin (LIPITOR) 40 MG tablet Take 1 tablet (40 mg total) by mouth daily.   Blood Glucose Monitoring Suppl (ONETOUCH VERIO IQ SYSTEM) w/Device KIT USE TO CHECK BLOOD SUGAR TWICE A DAY DX CODE E11.9   colchicine 0.6 MG tablet Take 1 tablet (0.6 mg total) by mouth daily as needed (for gout attack.).   EASY COMFORT PEN NEEDLES 31G X 5 MM MISC Used to check  Blood glucose 3 times daily or prn.   Fluocinolone Acetonide Body 0.01 % OIL Apply 1 application topically as directed.   furosemide (LASIX) 20 MG tablet Take 1 tablet (20 mg total) by mouth daily as needed for edema.   metFORMIN (GLUCOPHAGE) 1000 MG tablet TAKE ONE TABLET BY MOUTH TWICE DAILY. TAKE WITH A MEAL.   metoprolol tartrate (LOPRESSOR) 100 MG tablet TAKE 1 TABLET BY MOUTH TWICE A DAY   NOVOLOG FLEXPEN 100 UNIT/ML FlexPen INJECT 20-22 UNITS INTO SKIN PRIOR TO BREAKFAST AND 20-22 UNITS PRIOR TO EVENING MEAL   nystatin-triamcinolone ointment (MYCOLOG) Daily as needed on affected area.   ONETOUCH  DELICA LANCETS 92E MISC Use to check blood sugar twice daily. DX Code E11.9   ONETOUCH VERIO test strip USE AS INSTRUCTED   ramipril (ALTACE) 10 MG capsule Take 10 mg by mouth daily.   sildenafil (VIAGRA) 50 MG tablet Take 1 tablet (50 mg total) by mouth daily as needed for erectile dysfunction.   spironolactone (ALDACTONE) 25 MG tablet Take 1 tablet (25 mg total) by mouth daily. For blood pressure.   TRESIBA FLEXTOUCH 100 UNIT/ML FlexTouch Pen INJECT 38 UNITS SUBCUTANEOUSLY ONCE DAILY   No facility-administered encounter medications on file as of 02/08/2021.    [place] Clinical Pharmacist Assistant [phone]

## 2021-02-09 ENCOUNTER — Ambulatory Visit (INDEPENDENT_AMBULATORY_CARE_PROVIDER_SITE_OTHER): Payer: HMO | Admitting: Pharmacist

## 2021-02-09 DIAGNOSIS — E1169 Type 2 diabetes mellitus with other specified complication: Secondary | ICD-10-CM

## 2021-02-09 DIAGNOSIS — Z794 Long term (current) use of insulin: Secondary | ICD-10-CM | POA: Diagnosis not present

## 2021-02-09 DIAGNOSIS — I10 Essential (primary) hypertension: Secondary | ICD-10-CM

## 2021-02-09 NOTE — Progress Notes (Signed)
Chronic Care Management Pharmacy Note  02/09/2021 Name:  Patrick Wilcox MRN:  [id number] DOB:  [birth year]  Summary: Pt is not checking BGs consistently at home A1c is not at goal < 7%  Recommendations/Changes made from today's visit: -Recommended Dexcom monitoring system and sent message to endocrinologist to discuss at upcoming visit -Recommended injecting Novolog 30 minutes prior to meals to avoid hypoglycemia  Plan: Follow up after endocrinology visit to help with set up of Dexcom and possible sample  Subjective: Patrick Wilcox is an 75 y.o. year old male who is a primary patient of [name], [name], MD.  The CCM team was consulted for assistance with disease management and care coordination needs.    Engaged with patient by telephone for follow up visit in response to provider referral for pharmacy case management and/or care coordination services.   Consent to Services:  The patient was given information about Chronic Care Management services, agreed to services, and gave verbal consent prior to initiation of services.  Please see initial visit note for detailed documentation.   Patient Care Team: [name], [name], MD as PCP - General (Family Medicine) [name], MD as PCP - Electrophysiology (Cardiology) [name], MD as Consulting Physician (Endocrinology) [name], [facility] as Pharmacist (Pharmacist)  Recent office visits: 01/20/21 Charlott Rakes, LPN: Patient presented for AWV.  12/16/20 [name], MD: Patient presented for follow up. No medication changes.  08/17/20 [name], MD: Patient presented for 6 month follow up. No medication changes made.  Recent consult visits: 11/12/20 [name] MD (Endocrinology) - seen for type 2 diabetes with hyperglycemia and other chronic conditions. No medication changes. Follow up in 3 months.   08/05/20 [name], MD: Patient presented for diabetes follow up. A1c increased to 7.9%. Decreased Tresiba to 36  units.  06/23/20 Lars Mage, MD (cardiology): Patient presented for PCVs follow up.    06/18/20 Wilfrid Lund, MD (gastro): Patient presented for colon cancer screening.   06/08/20 [name], MD: Patient presented for diabetes follow up. A1c decreased to 7.4%. No medication changes made.   05/12/20 Lars Mage, MD (cardiology): Patient presented for PCVs evaluation.   Hospital visits: None in previous 6 months  Objective:  Lab Results  Component Value Date   CREATININE 0.97 11/10/2020   BUN 12 11/10/2020   GFR 76.60 11/10/2020   GFRNONAA 121 09/19/2008   GFRAA 147 09/19/2008   NA 134 (L) 11/10/2020   K 4.1 11/10/2020   CALCIUM 9.3 11/10/2020   CO2 26 11/10/2020   GLUCOSE 244 (H) 11/10/2020    Lab Results  Component Value Date/Time   HGBA1C 8.5 (H) 11/10/2020 08:57 AM   HGBA1C 7.9 (H) 07/30/2020 08:31 AM   FRUCTOSAMINE 305 (H) 06/01/2020 08:55 AM   FRUCTOSAMINE 296 (H) 12/31/2019 10:23 AM   GFR 76.60 11/10/2020 08:57 AM   GFR 83.97 07/30/2020 08:31 AM   MICROALBUR 84.7 (H) 07/30/2020 08:31 AM   MICROALBUR 148.4 (H) 09/09/2019 09:47 AM    Last diabetic Eye exam:  Lab Results  Component Value Date/Time   HMDIABEYEEXA No Retinopathy 10/09/2017 12:00 AM    Last diabetic Foot exam:  Lab Results  Component Value Date/Time   HMDIABFOOTEX done 08/22/2011 12:00 AM     Lab Results  Component Value Date   CHOL 102 11/10/2020   HDL 27.70 (L) 11/10/2020   LDLCALC 48 11/10/2020   TRIG 129.0 11/10/2020   CHOLHDL 4 11/10/2020    Hepatic Function Latest Ref Rng &  Units 11/10/2020 09/09/2019 03/16/2018  Total Protein 6.0 - 8.3 g/dL 7.5 7.6 7.8  Albumin 3.5 - 5.2 g/dL 4.3 4.3 4.3  AST 0 - 37 U/L _0 ALT 0 - 53 U/L _1 Alk Phosphatase 39 - 117 U/L 114 119(H) 103  Total Bilirubin 0.2 - 1.2 mg/dL 1.0 1.1 0.7  Bilirubin, Direct 0.0 - 0.3 mg/dL - - -    Lab Results  Component Value Date/Time   TSH 3.33 09/07/2010 08:04 AM   TSH 1.98 09/19/2008 08:59 AM     CBC Latest Ref Rng & Units 09/07/2010 09/19/2008 08/29/2007  WBC 4.5 - 10.5 K/uL 10.9(H) 9.5 9.3  Hemoglobin 13.0 - 17.0 g/dL 14.5 14.2 13.6  Hematocrit 39.0 - 52.0 % 41.8 40.2 40.3  Platelets 150.0 - 400.0 K/uL 195.0 175 210    No results found for: VD25OH  Clinical ASCVD: Yes  The ASCVD Risk score Mikey Bussing DC Jr., et al., 2013) failed to calculate for the following reasons:   The patient has a prior MI or stroke diagnosis    Depression screen [facility] 2/9 01/20/2021 12/16/2020 01/01/2020  Decreased Interest 0 0 0  Down, Depressed, Hopeless 0 0 0  PHQ - 2 Score 0 0 0  Some recent data might be hidden      Social History   Tobacco Use  Smoking Status Never  Smokeless Tobacco Never   BP Readings from Last 3 Encounters:  12/16/20 138/70  11/12/20 140/80  08/17/20 118/80   Pulse Readings from Last 3 Encounters:  12/16/20 69  11/12/20 96  08/17/20 100   Wt Readings from Last 3 Encounters:  12/16/20 219 lb 4 oz (99.5 kg)  11/12/20 219 lb 8 oz (99.6 kg)  08/17/20 221 lb (100.2 kg)   BMI Readings from Last 3 Encounters:  12/16/20 29.74 kg/m  11/12/20 29.77 kg/m  08/17/20 29.97 kg/m    Assessment/Interventions: Review of patient past medical history, allergies, medications, health status, including review of consultants reports, laboratory and other test data, was performed as part of comprehensive evaluation and provision of chronic care management services.   SDOH:  (Social Determinants of Health) assessments and interventions performed: No  SDOH Screenings   Alcohol Screen: Not on file  Depression (PHQ2-9): Low Risk    PHQ-2 Score: 0  Financial Resource Strain: Low Risk    Difficulty of Paying Living Expenses: Not hard at all  Food Insecurity: No Food Insecurity   Worried About Charity fundraiser in the Last Year: Never true   Ran Out of Food in the Last Year: Never true  Housing: Low Risk    Last Housing Risk Score: 0  Physical Activity: Sufficiently Active    Days of Exercise per Week: 5 days   Minutes of Exercise per Session: 50 min  Social Connections: Moderately Integrated   Frequency of Communication with Friends and Family: Twice a week   Frequency of Social Gatherings with Friends and Family: More than three times a week   Attends Religious Services: More than 4 times per year   Active Member of [employer] or Organizations: Yes   Attends Archivist Meetings: 1 to 4 times per year   Marital Status: Widowed  Stress: No Stress Concern Present   Feeling of Stress : Not at all  Tobacco Use: Low Risk    Smoking Tobacco Use: Never   Smokeless Tobacco Use: Never  Transportation Needs: No Transportation Needs   Lack of Transportation (Medical): No  Lack of Transportation (Non-Medical): No    Did he see eye doctor?  CGM? GLP1?    CCM Care Plan  No Known Allergies  Medications Reviewed Today     Reviewed by Willette Brace, LPN (Licensed Practical Nurse) on 01/20/21 at 1055  Med List Status: <None>   Medication Order Taking? Sig Documenting Provider Last Dose Status Informant  Alcohol Swabs (B-D SINGLE USE SWABS REGULAR) PADS [id number] Yes Use as necessary Marletta Lor, MD Taking Active   allopurinol (ZYLOPRIM) 300 MG tablet [id number] Yes TAKE 1 TABLET BY MOUTH EVERY DAY [name], [name], MD Taking Active   amLODipine (NORVASC) 10 MG tablet [id number] Yes TAKE 1 TABLET BY MOUTH EVERY DAY [name], [name], MD Taking Active   aspirin 81 MG tablet [id number] Yes Take 81 mg by mouth daily. [provider] Taking Active   atorvastatin (LIPITOR) 40 MG tablet [id number] Yes Take 1 tablet (40 mg total) by mouth daily. [name], [name], MD Taking Active   Blood Glucose Monitoring Suppl [facility] VERIO IQ SYSTEM) w/Device Drucie Opitz [id number] Yes USE TO CHECK BLOOD SUGAR TWICE A DAY DX CODE E11.9 [name], MD Taking Active   colchicine 0.6 MG tablet [id number] Yes Take 1 tablet (0.6 mg total) by mouth daily as needed (for gout  attack.). [name], [name], MD Taking Active   EASY COMFORT PEN NEEDLES 31G X 5 MM MISC [id number] Yes Used to check  Blood glucose 3 times daily or prn. Marletta Lor, MD Taking Active   Fluocinolone Acetonide Body 0.01 % OIL [id number] Yes Apply 1 application topically as directed. [provider] Taking Active   furosemide (LASIX) 20 MG tablet [id number] Yes Take 1 tablet (20 mg total) by mouth daily as needed for edema. [name], [name], MD Taking Active   metFORMIN (GLUCOPHAGE) 1000 MG tablet [id number] Yes TAKE ONE TABLET BY MOUTH TWICE DAILY. TAKE WITH A MEAL. [name], [name], MD Taking Active   metoprolol tartrate (LOPRESSOR) 100 MG tablet [id number] Yes TAKE 1 TABLET BY MOUTH TWICE A DAY [name], [name], MD Taking Active   NOVOLOG FLEXPEN 100 UNIT/ML FlexPen [id number] Yes INJECT 20-22 UNITS INTO SKIN PRIOR TO BREAKFAST AND 20-22 UNITS PRIOR TO EVENING MEAL [name], MD Taking Active   nystatin-triamcinolone ointment [facility]) [id number] Yes Daily as needed on affected area. [name], [name], MD Taking Active   [facility] LANCETS 32Y MISC [id number] Yes Use to check blood sugar twice daily. DX Code E11.9 [name], MD Taking Active   [facility] VERIO test strip [id number] Yes USE AS INSTRUCTED [name], [name], MD Taking Active   ramipril (ALTACE) 10 MG capsule [id number] Yes Take 10 mg by mouth daily. [provider] Taking Active   sildenafil (VIAGRA) 50 MG tablet [id number] Yes Take 1 tablet (50 mg total) by mouth daily as needed for erectile dysfunction. [name], [name], MD Taking Active   spironolactone (ALDACTONE) 25 MG tablet [id number] Yes Take 1 tablet (25 mg total) by mouth daily. For blood pressure. [name], [name], MD Taking Active   TRESIBA FLEXTOUCH 100 UNIT/ML FlexTouch Pen [id number] Yes INJECT 78 UNITS SUBCUTANEOUSLY ONCE DAILY [name], MD Taking Active             Patient Active Problem List   Diagnosis Date Noted   (HFpEF) heart failure with  preserved ejection fraction ([place]) 12/16/2020   Microalbuminuria due to type 2 diabetes mellitus ([place]) 08/05/2020   Hypertension associated with diabetes ([place]) 08/26/2015   PNEUMONIA,  LEFT LOWER LOBE 09/08/2009   GOUT 05/20/2008   Type 2 diabetes mellitus with other specified complication ([place]) 37/34/2876   Pure hypercholesterolemia 11/23/2006   Essential hypertension 11/23/2006   MYOCARDIAL INFARCTION, HX OF 11/23/2006   Coronary atherosclerosis 11/23/2006    Immunization History  Administered Date(s) Administered   Fluad Quad(high Dose 65+) 05/15/2019, 04/08/2020   Influenza Split 04/13/2011, 05/31/2012   Influenza Whole 05/21/2007, 06/22/2009, 06/15/2010   Influenza, High Dose Seasonal PF 05/02/2013, 03/31/2016, 06/18/2018   Influenza,inj,Quad PF,6+ Mos 04/02/2014, 03/30/2015, 06/08/2017   PFIZER(Purple Top)SARS-COV-2 Vaccination 09/12/2019, 10/04/2019, 06/20/2020   Pneumococcal Conjugate-13 08/26/2015   Pneumococcal Polysaccharide-23 05/02/2013   Tetanus 08/02/2013   Zoster Recombinat (Shingrix) 06/20/2020, 09/08/2020   Patient inquired about the referral Dr. [name] made for his mass removal. Informed patient that referral was denied but sent message to referral coordinator to see if she could be reopened based on more recent notes.  Conditions to be addressed/monitored:  Hypertension, Hyperlipidemia, Diabetes and Gout  Conditions addressed this visit: Hypertension, diabetes  Care Plan : CCM Pharmacy Care Plan  Updates made by [name], [place] since 02/09/2021 12:00 AM     Problem: Problem: Hypertension, Hyperlipidemia, Diabetes and Gout      Long-Range Goal: Patient-Specific Goal   Start Date: 11/09/2020  Expected End Date: 11/09/2021  Recent Progress: On track  Priority: High  Note:   Current Barriers:  Unable to independently monitor therapeutic efficacy Unable to achieve control of diabetes   Pharmacist Clinical Goal(s):  Patient will verbalize ability to  afford treatment regimen achieve adherence to monitoring guidelines and medication adherence to achieve therapeutic efficacy achieve control of diabetes as evidenced by A1c  through collaboration with PharmD and provider.   Interventions: 1:1 collaboration with [name], [name], MD regarding development and update of comprehensive plan of care as evidenced by provider attestation and co-signature Inter-disciplinary care team collaboration (see longitudinal plan of care) Comprehensive medication review performed; medication list updated in electronic medical record  Hypertension (BP goal <140/90) -Not ideally controlled -Current treatment: amlodipine 68m, 1 tablet once daily metoprolol tartrate 1061m 1 tablet twice daily ramipril 1075m1 capsule once daily  furosemide 38m46m tablet once daily sprironolactone  25mg53mtablet once daily -Medications previously tried: n/a -Current home readings: could not provide as patient does not check consistently -Current dietary habits: did not discuss -Current exercise habits: did not discuss -Denies hypotensive/hypertensive symptoms -Educated on Daily salt intake goal < 2300 mg; Exercise goal of 150 minutes per week; Importance of home blood pressure monitoring; Proper BP monitoring technique; Symptoms of hypotension and importance of maintaining adequate hydration; -Counseled to monitor BP at home weekly, document, and provide log at future appointments -Counseled on diet and exercise extensively Recommended to continue current medication  Hyperlipidemia/history of MI: (LDL goal < 70) -Controlled -Current treatment: atorvastatin 40mg,62mablet once daily  aspirin 81mg, 44mblet once daily  -Medications previously tried: none  -Current dietary patterns: did not discuss -Current exercise habits: did not discuss -Educated on Cholesterol goals;  Importance of limiting foods high in cholesterol; Exercise goal of 150 minutes per  week; -Counseled on diet and exercise extensively Recommended to continue current medication  Diabetes (A1c goal <7%) -Uncontrolled -Current medications: insulin aspart (Novolog Flexpen), inject 10-12 units at breakfast if needed and 12 units at suppertime  insulin degludec (TresibTyler Aasuch), inject 40 units at suppertime (changing times)  metformin 1000mg, 134mlet twice daily  -Medications previously tried: n/a  -Current home glucose readings  fasting glucose: ranging from 90s-150s post prandial glucose: n/a -Denies hypoglycemic/hyperglycemic symptoms -Current meal patterns:  breakfast: did not discuss lunch: did not discuss  dinner: did not discuss snacks: did not discuss drinks: cut back on sodas drinking 2 per week, drinking more water and drinking some gatorade  -Current exercise: patient reports he is walking almost every day and has been active with driving his sister around -Educated on A1c and blood sugar goals; Exercise goal of 150 minutes per week; Benefits of routine self-monitoring of blood sugar; Continuous glucose monitoring; -Counseled to check feet daily and get yearly eye exams -Counseled on diet and exercise extensively Recommended to continue current medication Collaborated with Dr. Dwyane Dee to start CGM monitoring.  Gout (Goal: uric acid < 6 and prevent flare ups) -Controlled -Current treatment  allopurinol 324m, 1 tablet once daily  colchicine 0.610m 1 tablet once daily as needed -Medications previously tried: none  -Recommended to continue current medication  Health Maintenance -Vaccine gaps: none -Current therapy:  No medications -Educated on Cost vs benefit of each product must be carefully weighed by individual consumer -Patient is satisfied with current therapy and denies issues -Recommended to continue as is  Patient Goals/Self-Care Activities Patient will:  - take medications as prescribed focus on medication adherence by setting a timer  for evening dose of metoprolol check glucose a few times daily, document, and provide at future appointments check blood pressure weekly, document, and provide at future appointments target a minimum of 150 minutes of moderate intensity exercise weekly  Follow Up Plan: Telephone follow up appointment with care management team member scheduled for: 4 months        Medication Assistance: None required.  Patient affirms current coverage meets needs.  Compliance/Adherence/Medication fill history: Care Gaps: Foot exam, eye exam, colonoscopy, COVID booster  Star-Rating Drugs: Atorvastatin 4036m last filled on 02-04-2021 90[facility] Metformin 1000m9mlast filled on 02-04-2021 [facility] at [facility]  Patient's [facility] is:  [id number]: [phone]: [phone]/pharmacy #[id number] [id number]: [phone], [place]: [phone] pill box? Yes Pt endorses 80% compliance  We discussed: Benefits of medication synchronization, packaging and delivery as well as enhanced pharmacist oversight with [employer]. Patient decided to: Continue current medication management strategy  Care Plan and Follow Up Patient Decision:  Patient agrees to Care Plan and Follow-up.  Plan: Telephone follow up appointment with care management team member scheduled for:  3 months  MadelJeni SallesrmD BCACPAddisonmacist L[phone]

## 2021-02-11 ENCOUNTER — Other Ambulatory Visit: Payer: Self-pay | Admitting: Family Medicine

## 2021-02-11 ENCOUNTER — Other Ambulatory Visit: Payer: Self-pay

## 2021-02-11 ENCOUNTER — Other Ambulatory Visit (INDEPENDENT_AMBULATORY_CARE_PROVIDER_SITE_OTHER): Payer: HMO

## 2021-02-11 DIAGNOSIS — E1165 Type 2 diabetes mellitus with hyperglycemia: Secondary | ICD-10-CM

## 2021-02-11 DIAGNOSIS — R809 Proteinuria, unspecified: Secondary | ICD-10-CM | POA: Diagnosis not present

## 2021-02-11 DIAGNOSIS — E1129 Type 2 diabetes mellitus with other diabetic kidney complication: Secondary | ICD-10-CM

## 2021-02-11 DIAGNOSIS — Z794 Long term (current) use of insulin: Secondary | ICD-10-CM

## 2021-02-11 DIAGNOSIS — E1159 Type 2 diabetes mellitus with other circulatory complications: Secondary | ICD-10-CM

## 2021-02-11 LAB — BASIC METABOLIC PANEL
BUN: 11 mg/dL (ref 6–23)
CO2: 26 mEq/L (ref 19–32)
Calcium: 9.7 mg/dL (ref 8.4–10.5)
Chloride: 101 mEq/L (ref 96–112)
Creatinine, Ser: 0.87 mg/dL (ref 0.40–1.50)
GFR: 84.52 mL/min (ref >60.00)
Glucose, Bld: 138 mg/dL — ABNORMAL HIGH (ref 70–99)
Potassium: 4.3 mEq/L (ref 3.5–5.1)
Sodium: 138 mEq/L (ref 135–145)

## 2021-02-11 LAB — MICROALBUMIN / CREATININE URINE RATIO
Creatinine,U: 233.9 mg/dL
Microalb Creat Ratio: 39.9 mg/g — ABNORMAL HIGH (ref 0.0–30.0)
Microalb, Ur: 93.4 mg/dL — ABNORMAL HIGH (ref 0.0–1.9)

## 2021-02-11 LAB — HEMOGLOBIN A1C: Hgb A1c MFr Bld: 7.5 % — ABNORMAL HIGH (ref 4.6–6.5)

## 2021-02-12 MED ORDER — RAMIPRIL 10 MG PO CAPS: 10.0000 mg | ORAL_CAPSULE | Freq: Every day | ORAL | 1 refills | Status: DC

## 2021-02-12 NOTE — Addendum Note (Signed)
Addended by: Rodrigo Ran on: 02/12/2021 09:48 AM   Modules accepted: Orders

## 2021-02-16 ENCOUNTER — Ambulatory Visit: Payer: HMO | Admitting: Endocrinology

## 2021-02-16 ENCOUNTER — Encounter: Payer: Self-pay | Admitting: Endocrinology

## 2021-02-16 ENCOUNTER — Other Ambulatory Visit: Payer: Self-pay

## 2021-02-16 ENCOUNTER — Ambulatory Visit (INDEPENDENT_AMBULATORY_CARE_PROVIDER_SITE_OTHER): Payer: HMO | Admitting: Endocrinology

## 2021-02-16 VITALS — BP 142/80 | HR 69 | Ht 72.0 in | Wt 215.8 lb

## 2021-02-16 DIAGNOSIS — E1129 Type 2 diabetes mellitus with other diabetic kidney complication: Secondary | ICD-10-CM

## 2021-02-16 DIAGNOSIS — R809 Proteinuria, unspecified: Secondary | ICD-10-CM | POA: Diagnosis not present

## 2021-02-16 DIAGNOSIS — I10 Essential (primary) hypertension: Secondary | ICD-10-CM

## 2021-02-16 DIAGNOSIS — Z794 Long term (current) use of insulin: Secondary | ICD-10-CM | POA: Diagnosis not present

## 2021-02-16 DIAGNOSIS — E1165 Type 2 diabetes mellitus with hyperglycemia: Secondary | ICD-10-CM

## 2021-02-16 MED ORDER — DEXCOM G6 SENSOR MISC: 3 refills | Status: DC

## 2021-02-16 MED ORDER — DEXCOM G6 TRANSMITTER MISC: 1.0000 | Freq: Once | 1 refills | Status: AC

## 2021-02-16 MED ORDER — DEXCOM G6 RECEIVER DEVI: 0 refills | Status: DC

## 2021-02-16 NOTE — Progress Notes (Signed)
Patient ID: PHI AVANS, male   DOB: [birth year], 75 y.o.   MRN: [id number]    Reason for Appointment: Followup for Type 2 Diabetes   History of Present Illness:          Diagnosis: Type 2 diabetes mellitus, date of diagnosis: 2004       Past history:  At diagnosis he was having symptoms of drowsiness, difficulty breathing and blood sugar was reportedly over 600 He was initially treated with insulin but subsequently switched back to oral drugs He may have taken metformin and other medications for some time before going back on insulin His records indicate that he had been taking Amaryl and Januvia before going back on insulin in mid-2014 when his A1c had gone up over 13 Initially was given premixed insulin and then switched to Lantus and Humalog Record of his A1c indicates that his levels previously had been over 8% since 02/2012 He  had much better blood sugar control on his last visit with his changing his lifestyle and being instructed by the nurse educator  His Lantus did not appear to have 24 hour control with once a day injection  Recent history:   INSULIN regimen is described as:  38 units Tresiba daily in pm., Novolog 20 units after breakfast and dinner        Oral hypoglycemic drugs the patient is taking are: Metformin 1 g twice a day     His A1c is down to 7.5 compared to 8.5 on the last visit, was 7.9, previously   Glucose monitoring, current management and problems identified: He is now taking his NovoLog with the meal and more regularly than before  Also he was adjusting the dose arbitrarily but mostly taking 20 units  However despite asking him to check readings after meals he does not do so and only does readings in the mornings, mostly before eating Has only a few readings after breakfast  Today with eating doughnuts he took 2 units more on the NovoLog  Sometimes he has high readings in the morning either from drinking Coke or eating other sweets the  night before Fasting readings are averaging mildly increased but as low as 82  He says he is interested in doing CGM but only if it is not expensive He has continued some walking up to 3 days a week Weight is down 4 pounds He has some protein at breakfast usually However his blood sugar after breakfast in the lab was 244, this was without his NovoLog Although he thinks he is taking his evening [name] consistently he may possibly be taking less insulin sometimes Also only once he was out of metformin but has been getting his insulin regularly as prescribed  No hypoglycemic symptoms reported   DIET: Has seen the dietitian in 2014 and nurse educator in 8/18        Meals: 2 meals per day 9-10 am and 6 pm.   Breakfast is  eggs;  toast, eggs, has fruit for snacks.  Evening meal usually vegetables, tuna salad, chicken.       Compliance with the medical regimen:  Fair    Glucose monitoring: Checking less than 1 times a day        Glucometer:  One Touch Verio    Blood Glucose readings  from meter download   PRE-MEAL Fasting Lunch Dinner Bedtime Overall  Glucose range: 82-171      Mean/median: 129    128   POST-MEAL PC  Breakfast PC Lunch PC Dinner  Glucose range:   ?  Mean/median: 132       PREVIOUSLY   PRE-MEAL Fasting Lunch Dinner Bedtime Overall  Glucose range:  97-151      Mean/median:     123     Exercise:  walking about a mile, 4-5/7 days   Weight history:  Wt Readings from Last 3 Encounters:  02/16/21 215 lb 12.8 oz (97.9 kg)  12/16/20 219 lb 4 oz (99.5 kg)  11/12/20 219 lb 8 oz (99.6 kg)    Glycemic control:   Lab Results  Component Value Date   HGBA1C 7.5 (H) 02/11/2021   HGBA1C 8.5 (H) 11/10/2020   HGBA1C 7.9 (H) 07/30/2020   Lab Results  Component Value Date   MICROALBUR 93.4 (H) 02/11/2021   LDLCALC 48 11/10/2020   CREATININE 0.87 02/11/2021   Lab Results  Component Value Date   FRUCTOSAMINE 305 (H) 06/01/2020   FRUCTOSAMINE 296 (H)  12/31/2019   FRUCTOSAMINE 315 (H) 09/09/2019    Other active problems: See review of systems   Lab on 02/11/2021  Component Date Value Ref Range Status   Microalb, Ur 02/11/2021 93.4 (A) 0.0 - 1.9 mg/dL Final   Creatinine,U 02/11/2021 233.9  mg/dL Final   Microalb Creat Ratio 02/11/2021 39.9 (A) 0.0 - 30.0 mg/g Final   Sodium 02/11/2021 138  135 - 145 mEq/L Final   Potassium 02/11/2021 4.3  3.5 - 5.1 mEq/L Final   Chloride 02/11/2021 101  96 - 112 mEq/L Final   CO2 02/11/2021 26  19 - 32 mEq/L Final   Glucose, Bld 02/11/2021 138 (A) 70 - 99 mg/dL Final   BUN 02/11/2021 11  6 - 23 mg/dL Final   Creatinine, Ser 02/11/2021 0.87  0.40 - 1.50 mg/dL Final   GFR 02/11/2021 84.52  >60.00 mL/min Final   Calculated using the CKD-EPI Creatinine Equation (2021)   Calcium 02/11/2021 9.7  8.4 - 10.5 mg/dL Final   Hgb A1c MFr Bld 02/11/2021 7.5 (A) 4.6 - 6.5 % Final   Glycemic Control Guidelines for People with Diabetes:Non Diabetic:  <6%Goal of Therapy: <7%Additional Action Suggested:  >8%       Allergies as of 02/16/2021   No Known Allergies      Medication List        Accurate as of February 16, 2021 11:33 AM. If you have any questions, ask your nurse or doctor.          allopurinol 300 MG tablet Commonly known as: ZYLOPRIM TAKE 1 TABLET BY MOUTH EVERY DAY   amLODipine 10 MG tablet Commonly known as: NORVASC TAKE 1 TABLET BY MOUTH EVERY DAY   aspirin 81 MG tablet Take 81 mg by mouth daily.   atorvastatin 40 MG tablet Commonly known as: LIPITOR Take 1 tablet (40 mg total) by mouth daily.   B-D SINGLE USE SWABS REGULAR Pads Use as necessary   colchicine 0.6 MG tablet Take 1 tablet (0.6 mg total) by mouth daily as needed (for gout attack.).   Easy Comfort Pen Needles 31G X 5 MM Misc Generic drug: Insulin Pen Needle Used to check  Blood glucose 3 times daily or prn.   Fluocinolone Acetonide Body 0.01 % Oil Apply 1 application topically as directed.   furosemide 20 MG  tablet Commonly known as: LASIX Take 1 tablet (20 mg total) by mouth daily as needed for edema.   metFORMIN 1000 MG tablet Commonly known as: GLUCOPHAGE TAKE ONE TABLET BY MOUTH  TWICE DAILY. TAKE WITH A MEAL.   metoprolol tartrate 100 MG tablet Commonly known as: LOPRESSOR TAKE 1 TABLET BY MOUTH TWICE A DAY   NovoLOG FlexPen 100 UNIT/ML FlexPen Generic drug: insulin aspart INJECT 20-22 UNITS INTO SKIN PRIOR TO BREAKFAST AND 20-22 UNITS PRIOR TO EVENING MEAL   nystatin-triamcinolone ointment Commonly known as: MYCOLOG Daily as needed on affected area.   OneTouch Delica Lancets 21J Misc Use to check blood sugar twice daily. DX Code E11.9   OneTouch Verio IQ System w/Device Kit USE TO CHECK BLOOD SUGAR TWICE A DAY DX CODE E11.9   OneTouch Verio test strip Generic drug: glucose blood USE AS INSTRUCTED   ramipril 10 MG capsule Commonly known as: ALTACE Take 1 capsule (10 mg total) by mouth daily.   sildenafil 50 MG tablet Commonly known as: VIAGRA Take 1 tablet (50 mg total) by mouth daily as needed for erectile dysfunction.   spironolactone 25 MG tablet Commonly known as: ALDACTONE Take 1 tablet (25 mg total) by mouth daily. For blood pressure.   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 He has never used smokeless tobacco. He reports that he does not drink alcohol and does not use drugs.    Review of Systems        Lipids:  LDL has been treated to goal with Lipitor 40 mg Has history of CAD, followed by PCP HDL has been low       Lab Results  Component Value Date   CHOL 102 11/10/2020   HDL 27.70 (L) 11/10/2020   LDLCALC 48 11/10/2020   TRIG 129.0 11/10/2020   CHOLHDL 4 11/10/2020       Is high blood pressure has been treated with Norvasc 10 mg, ramipril 10 mg, Aldactone 25 mg and metoprolol, Followed by PCP   He is also on Lasix from PCP     BP Readings from Last 3 Encounters:  02/16/21 (!) 142/80  12/16/20 138/70  11/12/20 140/80   Lab Results  Component Value Date   K 4.3 02/11/2021   Microalbumin is improving and now is normal is normal  Lab Results  Component Value Date   MICRALBCREAT 39.9 (H) 02/11/2021   MICRALBCREAT 55.6 (H) 07/30/2020   MICRALBCREAT 98.1 (H) 09/09/2019   MICRALBCREAT 13.1 94/17/4081     Complications: Microalbuminuria   Diabetic foot exam was done in 11/20 by his PCP  Last exam with eye doctor  was 4/20, has still not been able to make appointment for follow-up       LABS:  Lab on 02/11/2021  Component Date Value Ref  Range Status   Microalb, Ur 02/11/2021 93.4 (A) 0.0 - 1.9 mg/dL Final   Creatinine,U 02/11/2021 233.9  mg/dL Final   Microalb Creat Ratio 02/11/2021 39.9 (A) 0.0 - 30.0 mg/g Final   Sodium 02/11/2021 138  135 - 145 mEq/L Final   Potassium 02/11/2021 4.3  3.5 - 5.1 mEq/L Final   Chloride 02/11/2021 101  96 - 112 mEq/L Final   CO2 02/11/2021 26  19 - 32 mEq/L Final   Glucose, Bld 02/11/2021 138 (A) 70 - 99 mg/dL Final   BUN 02/11/2021 11  6 - 23 mg/dL Final   Creatinine, Ser 02/11/2021 0.87  0.40 - 1.50 mg/dL Final   GFR 02/11/2021 84.52  >60.00 mL/min Final   Calculated using the CKD-EPI Creatinine Equation  (2021)   Calcium 02/11/2021 9.7  8.4 - 10.5 mg/dL Final   Hgb A1c MFr Bld 02/11/2021 7.5 (A) 4.6 - 6.5 % Final   Glycemic Control Guidelines for People with Diabetes:Non Diabetic:  <6%Goal of Therapy: <7%Additional Action Suggested:  >8%     Physical Examination:  BP (!) 142/80   Pulse 69   Ht 6' (1.829 m)   Wt 215 lb 12.8 oz (97.9 kg)   SpO2 99%   BMI 29.27 kg/m      ASSESSMENT/PLAN  Diabetes type 2, On insulin   See history of present illness for detailed discussion of current management, problems identified and blood sugar patterns.  A1c is now 7.5 compared to 8.5  He has been on basal bolus insulin and Metformin  His blood sugars are likely better control from taking his NovoLog more consistently twice a day and also timing it before eating or with the meal instead of 2 hours later  Some fluctuation in his fasting readings occurs because of inconsistent diet and getting into regular soft drinks at times  However not clear how to adjust his mealtime dose since he does not check readings after dinner  Blood sugars after breakfast appear to be fairly good when checked  Weight is slightly better  No hypoglycemia  Plan: Emphasized the need to check blood sugars after meals instead of just in the morning He can increase his NovoLog 2 to 4 units for higher carbohydrate intake or higher calorie meals such as eating out Conversely can reduce the dose if needed for lighter meals No change in basal insulin as yet Discussed A1c target of 7% ideally Discussed the advantages of continuous glucose monitoring which will help him adjust his mealtime insulin and understand how his blood sugars behave after meals and also alerted him to any unexpected low sugars including overnight  May benefit from continuous glucose monitoring but since he likely will not check his readings on the freestyle libre with scanning after meals he will need to use the Dexcom if approved by his insurance   Prescription sent today  HYPERTENSION: Blood pressure is fairly well controlled, may be somewhat higher when he first comes into the office He will also follow-up with his PCP  Microalbumin has been gradually improving likely from recently better control and better blood pressure control    Need for eye exams: He still has not been seen and given the number to call for [facility] ophthalmology  Follow-up in 3 months  Patient Instructions  Take 1/2 of sugar values at 8-9 pm at nite also  [facility] call [phone]       [name] 02/16/2021, 11:33 AM   Note: This office note was prepared with  [employer] therapist. Any transcriptional errors that result from this process are unintentional.

## 2021-02-16 NOTE — Patient Instructions (Addendum)
Take 1/2 of sugar values at 8-9 pm at [facility] also  [place] call [phone]

## 2021-03-01 ENCOUNTER — Other Ambulatory Visit: Payer: Self-pay

## 2021-03-01 DIAGNOSIS — D3610 Benign neoplasm of peripheral nerves and autonomic nervous system, unspecified: Secondary | ICD-10-CM

## 2021-03-16 ENCOUNTER — Ambulatory Visit: Payer: HMO | Admitting: Endocrinology

## 2021-03-29 ENCOUNTER — Other Ambulatory Visit: Payer: Self-pay | Admitting: Endocrinology

## 2021-03-29 DIAGNOSIS — E1169 Type 2 diabetes mellitus with other specified complication: Secondary | ICD-10-CM

## 2021-04-21 ENCOUNTER — Telehealth: Payer: Self-pay | Admitting: Pharmacist

## 2021-04-21 NOTE — Chronic Care Management (AMB) (Signed)
Chronic Care Management Pharmacy Assistant   Name: KYSON KUPPER  MRN: [id number] DOB: [birth year]  Reason for Encounter: Disease State/ Diabetes Assessment Call.    Conditions to be addressed/monitored: DMII   Recent office visits:  None.  Recent consult visits:  02/16/21 [name] MD (Endocrinology) - seen for uncontrolled type 2 diabetes mellitus with hyperglycemia, with long-term current use of insulin. No medication changes. Follow up in 3 months.   Hospital visits:  None in previous 6 months  Medications: Outpatient Encounter Medications as of 04/21/2021  Medication Sig   Alcohol Swabs (B-D SINGLE USE SWABS REGULAR) PADS Use as necessary   allopurinol (ZYLOPRIM) 300 MG tablet TAKE 1 TABLET BY MOUTH EVERY DAY   amLODipine (NORVASC) 10 MG tablet TAKE 1 TABLET BY MOUTH EVERY DAY   aspirin 81 MG tablet Take 81 mg by mouth daily.   atorvastatin (LIPITOR) 40 MG tablet Take 1 tablet (40 mg total) by mouth daily.   Blood Glucose Monitoring Suppl (ONETOUCH VERIO IQ SYSTEM) w/Device KIT USE TO CHECK BLOOD SUGAR TWICE A DAY DX CODE E11.9   colchicine 0.6 MG tablet Take 1 tablet (0.6 mg total) by mouth daily as needed (for gout attack.).   Continuous Blood Gluc Receiver (DEXCOM G6 RECEIVER) DEVI Use to record blood sugars from sensor   Continuous Blood Gluc Sensor (DEXCOM G6 SENSOR) MISC Use to monitor blood sugar, change after 10 days   EASY COMFORT PEN NEEDLES 31G X 5 MM MISC Used to check  Blood glucose 3 times daily or prn.   Fluocinolone Acetonide Body 0.01 % OIL Apply 1 application topically as directed.   furosemide (LASIX) 20 MG tablet Take 1 tablet (20 mg total) by mouth daily as needed for edema.   metFORMIN (GLUCOPHAGE) 1000 MG tablet TAKE ONE TABLET BY MOUTH TWICE DAILY. TAKE WITH A MEAL.   metoprolol tartrate (LOPRESSOR) 100 MG tablet TAKE 1 TABLET BY MOUTH TWICE A DAY   NOVOLOG FLEXPEN 100 UNIT/ML FlexPen INJECT 20-22 UNITS INTO SKIN PRIOR TO BREAKFAST AND 20-22 UNITS  PRIOR TO EVENING MEAL   nystatin-triamcinolone ointment (MYCOLOG) Daily as needed on affected area.   ONETOUCH DELICA LANCETS 48J MISC Use to check blood sugar twice daily. DX Code E11.9   ONETOUCH VERIO test strip USE AS INSTRUCTED   ramipril (ALTACE) 10 MG capsule Take 1 capsule (10 mg total) by mouth daily.   sildenafil (VIAGRA) 50 MG tablet Take 1 tablet (50 mg total) by mouth daily as needed for erectile dysfunction.   spironolactone (ALDACTONE) 25 MG tablet Take 1 tablet (25 mg total) by mouth daily. For blood pressure.   TRESIBA FLEXTOUCH 100 UNIT/ML FlexTouch Pen INJECT 38 UNITS SUBCUTANEOUSLY ONCE DAILY   No facility-administered encounter medications on file as of 04/21/2021.   Fill History: ALLOPURINOL 300 MG TABLET 02/11/2021 90   ATORVASTATIN 40 MG TABLET 02/04/2021 90   COLCHICINE 0.6MG TABLET 03/29/2021 90   FLUOCINOLONE ACETONIDE BODY 0.01% OIL 04/14/2021 30   FUROSEMIDE 20 MG TABLET 03/29/2021 90   NOVOLOG 100 UNIT/ML FLEXPEN 03/29/2021 34   TRESIBA FLEXTOUCH 100 UNIT/ML 03/30/2021 30   RAMIPRIL 10 MG CAPSULE 03/29/2021 90   SPIRONOLACTONE 25 MG TABLET 03/29/2021 90   AMLODIPINE BESYLATE 10 MG TAB 03/29/2021 90   METFORMIN HCL 1,000 MG TABLET 02/04/2021 90   Recent Relevant Labs: Lab Results  Component Value Date/Time   HGBA1C 7.5 (H) 02/11/2021 08:48 AM   HGBA1C 8.5 (H) 11/10/2020 08:57 AM   MICROALBUR 93.4 (H)  02/11/2021 08:48 AM   MICROALBUR 84.7 (H) 07/30/2020 08:31 AM    Kidney Function Lab Results  Component Value Date/Time   CREATININE 0.87 02/11/2021 08:48 AM   CREATININE 0.97 11/10/2020 08:57 AM   GFR 84.52 02/11/2021 08:48 AM   GFRNONAA 121 09/19/2008 08:59 AM   GFRAA 147 09/19/2008 08:59 AM    Current antihyperglycemic regimen:  insulin aspart (Novolog Flexpen), inject 10-12 units at breakfast if needed and 12 units at suppertime  insulin degludec [name]), inject 40 units at suppertime (changing times)  metformin 1088m, 1  tablet twice daily  What recent interventions/DTPs have been made to improve glycemic control:  None. Have there been any recent hospitalizations or ED visits since last visit with CPP? No Patient hypoglycemic symptoms, including  Patient hyperglycemic symptoms, including  How often are you checking your blood sugar?  What are your blood sugars ranging?  Fasting:  Before meals:  After meals:  Bedtime:  During the week, how often does your blood glucose drop below 70?  Are you checking your feet daily/regularly?   Adherence Review: Is the patient currently on a STATIN medication? Yes Is the patient currently on ACE/ARB medication? Yes Does the patient have >5 day gap between last estimated fill dates? No  Multiple unsuccessful attempts to reach patient by phone.  Care Gaps:  AWV - scheduled for 02/02/22 Colonoscopy - overdue since 2020 Foot exam - overdue since 21 Covid-19 vaccine booster 4 - overdue since 10/13/20 Flu vaccine  - due  Star Rating Drugs:  Atorvastatin 440m- last filled on 02/04/21 90DS [facility] Metformin 100049m last filled on 02/04/21 90[facility] at [facility] Ramipril [id number] filled [facility]/21 [facility] [place] ([phone]

## 2021-04-26 NOTE — Telephone Encounter (Cosign Needed)
2nd attempt

## 2021-04-30 NOTE — Telephone Encounter (Cosign Needed)
3rd attempt

## 2021-05-08 ENCOUNTER — Other Ambulatory Visit: Payer: Self-pay | Admitting: Family Medicine

## 2021-05-17 ENCOUNTER — Other Ambulatory Visit (INDEPENDENT_AMBULATORY_CARE_PROVIDER_SITE_OTHER): Payer: HMO

## 2021-05-17 ENCOUNTER — Other Ambulatory Visit: Payer: Self-pay

## 2021-05-17 DIAGNOSIS — Z794 Long term (current) use of insulin: Secondary | ICD-10-CM

## 2021-05-17 DIAGNOSIS — E1165 Type 2 diabetes mellitus with hyperglycemia: Secondary | ICD-10-CM

## 2021-05-17 LAB — BASIC METABOLIC PANEL
BUN: 9 mg/dL (ref 6–23)
CO2: 27 mEq/L (ref 19–32)
Calcium: 9.6 mg/dL (ref 8.4–10.5)
Chloride: 99 mEq/L (ref 96–112)
Creatinine, Ser: 0.79 mg/dL (ref 0.40–1.50)
GFR: 86.85 mL/min (ref >60.00)
Glucose, Bld: 172 mg/dL — ABNORMAL HIGH (ref 70–99)
Potassium: 4.1 mEq/L (ref 3.5–5.1)
Sodium: 135 mEq/L (ref 135–145)

## 2021-05-17 LAB — HEMOGLOBIN A1C: Hgb A1c MFr Bld: 7.5 % — ABNORMAL HIGH (ref 4.6–6.5)

## 2021-05-19 NOTE — Progress Notes (Signed)
Patient ID: Patrick Wilcox, male   DOB: 01-Jul-1946, 75 y.o.   MRN: [id number]    Reason for Appointment: Followup for Type 2 Diabetes   History of Present Illness:          Diagnosis: Type 2 diabetes mellitus, date of diagnosis: 2004       Past history:  At diagnosis he was having symptoms of drowsiness, difficulty breathing and blood sugar was reportedly over 600 He was initially treated with insulin but subsequently switched back to oral drugs He may have taken metformin and other medications for some time before going back on insulin His records indicate that he had been taking Amaryl and Januvia before going back on insulin in mid-2014 when his A1c had gone up over 13 Initially was given premixed insulin and then switched to Lantus and Humalog Record of his A1c indicates that his levels previously had been over 8% since 02/2012 He  had much better blood sugar control on his last visit with his changing his lifestyle and being instructed by the nurse educator  His Lantus did not appear to have 24 hour control with once a day injection  Recent history:   INSULIN regimen is described as:  38 units Tresiba daily in pm., Novolog 20 units after breakfast and dinner        Oral hypoglycemic drugs the patient is taking are: Metformin 1 g twice a day     His A1c is down to 7.5    Glucose monitoring, current management and problems identified: He is now taking his NovoLog with the meal and more regularly than before  Also he was adjusting the dose arbitrarily but mostly taking 20 units  However despite asking him to check readings after meals he does not do so and only does readings in the mornings, mostly before eating Has only a few readings after breakfast    Sometimes he has high readings in the morning either from drinking Coke or eating other sweets the night before Fasting readings are averaging mildly increased but as low as 82  He says he is interested in doing CGM  but only if it is not expensive He has continued some walking up to 3 days a week Weight is down 4 pounds He has some protein at breakfast usually However his blood sugar after breakfast in the lab was 244, this was without his NovoLog Although he thinks he is taking his evening [name] consistently he may possibly be taking less insulin sometimes Also only once he was out of metformin but has been getting his insulin regularly as prescribed  No hypoglycemic symptoms reported   DIET: Has seen the dietitian in 2014 and nurse educator in 8/18        Meals: 2 meals per day 9-10 am and 6 pm.   Breakfast is  eggs;  toast, eggs, has fruit for snacks.  Evening meal usually vegetables, tuna salad, chicken.       Compliance with the medical regimen:  Fair    Glucose monitoring: Checking less than 1 times a day        Glucometer:  One Touch Verio    Blood Glucose readings  from meter download   PRE-MEAL Fasting Lunch Dinner Bedtime Overall  Glucose range: 80-155      Mean/median: 108    108   POST-MEAL PC Breakfast PC Lunch PC Dinner  Glucose range:     Mean/median:      Prior  PRE-MEAL Fasting Lunch Dinner Bedtime Overall  Glucose range: 82-171      Mean/median: 129    128   POST-MEAL PC Breakfast PC Lunch PC Dinner  Glucose range:   ?  Mean/median: 132       Exercise: Walking fairly regularly, 4-5/7 days   Weight history:  Wt Readings from Last 3 Encounters:  05/20/21 212 lb 3.2 oz (96.3 kg)  02/16/21 215 lb 12.8 oz (97.9 kg)  12/16/20 219 lb 4 oz (99.5 kg)    Glycemic control:   Lab Results  Component Value Date   HGBA1C 7.5 (H) 05/17/2021   HGBA1C 7.5 (H) 02/11/2021   HGBA1C 8.5 (H) 11/10/2020   Lab Results  Component Value Date   MICROALBUR 93.4 (H) 02/11/2021   LDLCALC 48 11/10/2020   CREATININE 0.79 05/17/2021   Lab Results  Component Value Date   FRUCTOSAMINE 305 (H) 06/01/2020   FRUCTOSAMINE 296 (H) 12/31/2019   FRUCTOSAMINE 315 (H)  09/09/2019    Other active problems: See review of systems   Lab on 05/17/2021  Component Date Value Ref Range Status   Sodium 05/17/2021 135  135 - 145 mEq/L Final   Potassium 05/17/2021 4.1  3.5 - 5.1 mEq/L Final   Chloride 05/17/2021 99  96 - 112 mEq/L Final   CO2 05/17/2021 27  19 - 32 mEq/L Final   Glucose, Bld 05/17/2021 172 (A)  70 - 99 mg/dL Final   BUN 05/17/2021 9  6 - 23 mg/dL Final   Creatinine, Ser 05/17/2021 0.79  0.40 - 1.50 mg/dL Final   GFR 05/17/2021 86.85  >60.00 mL/min Final   Calculated using the CKD-EPI Creatinine Equation (2021)   Calcium 05/17/2021 9.6  8.4 - 10.5 mg/dL Final   Hgb A1c MFr Bld 05/17/2021 7.5 (A)  4.6 - 6.5 % Final   Glycemic Control Guidelines for People with Diabetes:Non Diabetic:  <6%Goal of Therapy: <7%Additional Action Suggested:  >8%       Allergies as of 05/20/2021   No Known Allergies      Medication List        Accurate as of May 20, 2021  8:40 AM. If you have any questions, ask your nurse or doctor.          allopurinol 300 MG tablet Commonly known as: ZYLOPRIM TAKE 1 TABLET BY MOUTH EVERY DAY   amLODipine 10 MG tablet Commonly known as: NORVASC TAKE 1 TABLET BY MOUTH EVERY DAY   aspirin 81 MG tablet Take 81 mg by mouth daily.   atorvastatin 40 MG tablet Commonly known as: LIPITOR TAKE 1 TABLET BY MOUTH EVERY DAY   B-D SINGLE USE SWABS REGULAR Pads Use as necessary   colchicine 0.6 MG tablet Take 1 tablet (0.6 mg total) by mouth daily as needed (for gout attack.).   Dexcom G6 Receiver Devi Use to record blood sugars from sensor   Dexcom G6 Sensor Misc Use to monitor blood sugar, change after 10 days   Easy Comfort Pen Needles 31G X 5 MM Misc Generic drug: Insulin Pen Needle Used to check  Blood glucose 3 times daily or prn.   Fluocinolone Acetonide Body 0.01 % Oil Apply 1 application topically as directed.   furosemide 20 MG tablet Commonly known as: LASIX Take 1 tablet (20 mg total) by  mouth daily as needed for edema.   metFORMIN 1000 MG tablet Commonly known as: GLUCOPHAGE TAKE ONE TABLET BY MOUTH TWICE DAILY. TAKE WITH A MEAL.   metoprolol tartrate  100 MG tablet Commonly known as: LOPRESSOR TAKE 1 TABLET BY MOUTH TWICE A DAY   NovoLOG FlexPen 100 UNIT/ML FlexPen Generic drug: insulin aspart INJECT 20-22 UNITS INTO SKIN PRIOR TO BREAKFAST AND 20-22 UNITS PRIOR TO EVENING MEAL   nystatin-triamcinolone ointment Commonly known as: MYCOLOG Daily as needed on affected area.   OneTouch Delica Lancets 54Y Misc Use to check blood sugar twice daily. DX Code E11.9   OneTouch Verio IQ System w/Device Kit USE TO CHECK BLOOD SUGAR TWICE A DAY DX CODE E11.9   OneTouch Verio test strip Generic drug: glucose blood USE AS INSTRUCTED   ramipril 10 MG capsule Commonly known as: ALTACE Take 1 capsule (10 mg total) by mouth daily.   sildenafil 50 MG tablet Commonly known as: VIAGRA Take 1 tablet (50 mg total) by mouth daily as needed for erectile dysfunction.   spironolactone 25 MG tablet Commonly known as: ALDACTONE Take 1 tablet (25 mg total) by mouth daily. For blood pressure.   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 He has never used smokeless tobacco. He reports that he does not drink alcohol and does not use drugs.    Review of Systems        Lipids:  LDL has been treated to goal with Lipitor 40 mg Has history of CAD, followed by PCP HDL has been low       Lab Results  Component Value Date   CHOL 102 11/10/2020   HDL 27.70 (L) 11/10/2020   LDLCALC 48 11/10/2020   TRIG 129.0 11/10/2020   CHOLHDL 4 11/10/2020       Is high blood pressure has been treated with Norvasc 10 mg, ramipril 10 mg, Aldactone 25 mg and metoprolol, Followed by PCP   He is on Lasix from PCP     BP Readings from Last 3 Encounters:  05/20/21 (!) 150/80  02/16/21 (!) 142/80  12/16/20 138/70   Lab Results  Component Value Date   K 4.1 05/17/2021   Microalbumin is improving   Lab Results  Component Value Date   MICRALBCREAT 39.9 (H) 02/11/2021   MICRALBCREAT 55.6 (H) 07/30/2020   MICRALBCREAT 98.1 (H) 09/09/2019   MICRALBCREAT 13.1 50/35/4656     Complications: Microalbuminuria   Diabetic foot exam was done in 11/20 by his PCP  Last exam with eye doctor  was 4/20, has still not been able to make appointment for follow-up  If he is walking uphill he has a discomfort in the left chest and arm with a slight numb feeling but has not discussed with PCP, this is not new  LABS:  Lab on 05/17/2021  Component Date Value Ref Range Status   Sodium 05/17/2021 135  135 - 145 mEq/L Final   Potassium 05/17/2021 4.1  3.5 - 5.1 mEq/L Final   Chloride 05/17/2021 99  96 - 112 mEq/L Final   CO2 05/17/2021 27  19 - 32 mEq/L Final   Glucose, Bld 05/17/2021 172 (A)  70 - 99 mg/dL Final   BUN 05/17/2021 9  6 - 23 mg/dL Final   Creatinine, Ser 05/17/2021 0.79  0.40 - 1.50 mg/dL Final   GFR 05/17/2021 86.85  >60.00 mL/min Final   Calculated using the CKD-EPI Creatinine Equation (2021)   Calcium 05/17/2021 9.6  8.4 - 10.5 mg/dL Final   Hgb A1c MFr Bld 05/17/2021 7.5 (A)  4.6 - 6.5 % Final    Glycemic Control Guidelines for People with Diabetes:Non Diabetic:  <6%Goal of Therapy: <7%Additional Action Suggested:  >8%     Physical Examination:  BP (!) 150/80   Pulse 91   Ht '6\' 1"'  (1.854 m)   Wt 212 lb 3.2 oz (96.3 kg)   SpO2 99%   BMI 28.00 kg/m   Repeat blood pressure 160/82  Diabetic Foot Exam - Simple   Simple Foot Form Diabetic Foot exam was performed with the following findings: Yes   Visual Inspection No deformities, no ulcerations, no other skin breakdown bilaterally: Yes Sensation Testing Intact to touch and monofilament testing bilaterally: Yes Pulse Check See comments: Yes Comments Absent pedal pulses       ASSESSMENT/PLAN  Diabetes type 2, On insulin   See history of present illness for detailed discussion of current management, problems identified and blood sugar patterns.  A1c is now 7.5, unchanged  He has been on basal bolus insulin and Metformin  His blood sugars are likely still high after meals when he does not check them including in the lab and it was 172 He forgets to check his readings after meals  Not clear why is inconsistent with his NovoLog and has stopped taking this in the morning despite previously taking 20 units regularly  He may still go off his diet at times  Overall however he has lost weight  Fasting glucose readings are well controlled No hypoglycemia  Plan: Reminded him about the need to check blood sugars about 2 hours after meals instead of just in the morning He is going to call the DME supplier to get the freestyle libre He will also need to in the meantime start checking his sugars at least after breakfast to adjust his NovoLog She will start with 15 units of NovoLog at breakfast  We will adjust the dose further based on his readings 2 hours later He will set up an alarm to monitor his blood sugars 2 hours after any meal He will try to walk more regularly No change in [name] dosage Call if having consistently  high readings May need to also cut back on juice at breakfast  HYPERTENSION: Blood pressure is much higher today and persistently high on second measurement 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 Needs adequate blood pressure control because of microalbuminuria also  Foot exam normal except pedal pulses are difficult to palpate  He will also need to discuss his inconsistent symptoms of left chest discomfort with walking uphill with his PCP  Follow-up in 3 months  Patient Instructions  Check blood sugars on waking up 3 days  a week  Also check blood sugars about 2 hours after meals and do this after different meals by rotation  Recommended blood sugar levels on waking up are 90-130 and about 2 hours after meal is 130-160  Please bring your blood sugar monitor to each visit, thank you  Take 15 U Novolog at [facility] and adjust so sugar 2 hrs later is <180       [name] 05/20/2021, 8:40 AM   Note: This office note was prepared with Dragon voice recognition system technology. Any transcriptional errors that result from this process are unintentional.

## 2021-05-20 ENCOUNTER — Other Ambulatory Visit: Payer: Self-pay

## 2021-05-20 ENCOUNTER — Encounter: Payer: Self-pay | Admitting: Endocrinology

## 2021-05-20 ENCOUNTER — Ambulatory Visit: Payer: HMO | Admitting: Endocrinology

## 2021-05-20 VITALS — BP 150/80 | HR 91 | Ht 73.0 in | Wt 212.2 lb

## 2021-05-20 DIAGNOSIS — I10 Essential (primary) hypertension: Secondary | ICD-10-CM | POA: Diagnosis not present

## 2021-05-20 DIAGNOSIS — Z23 Encounter for immunization: Secondary | ICD-10-CM | POA: Diagnosis not present

## 2021-05-20 DIAGNOSIS — E1165 Type 2 diabetes mellitus with hyperglycemia: Secondary | ICD-10-CM | POA: Diagnosis not present

## 2021-05-20 DIAGNOSIS — Z794 Long term (current) use of insulin: Secondary | ICD-10-CM | POA: Diagnosis not present

## 2021-05-20 MED ORDER — HYDROCHLOROTHIAZIDE 12.5 MG PO CAPS: 12.5000 mg | ORAL_CAPSULE | Freq: Every day | ORAL | 1 refills | Status: DC

## 2021-05-20 NOTE — Patient Instructions (Addendum)
Check blood sugars on waking up 3 days a week  Also check blood sugars about 2 hours after meals and do this after different meals by rotation  Recommended blood sugar levels on waking up are 90-130 and about 2 hours after meal is 130-160  Please bring your blood sugar monitor to each visit, thank you  Take 15 U Novolog at [facility] and adjust so sugar 2 hrs later is <180  Check BP at home daily  Call for [facility] sensor

## 2021-05-24 ENCOUNTER — Telehealth: Payer: Self-pay

## 2021-05-24 NOTE — Telephone Encounter (Signed)
Appointment made for Wednesday at [facility].

## 2021-05-24 NOTE — Telephone Encounter (Signed)
-----   Message from [name], MD sent at 05/23/2021  2:58 PM EST ----- Regarding: FW: Today's visit Please arrange appt for this week to discuss these problems. Thanks, BJ ----- Message ----- From: [name], MD Sent: 05/20/2021  11:13 AM EST To: [name], MD Subject: Today's visit                                  Betty: He is reporting an ill-defined chest discomfort on exertion, may need cardiac evaluation.  Also his blood pressure was significantly high and I have added HCTZ 12.5 mg.  He sees you next month, thanks

## 2021-05-25 NOTE — Progress Notes (Signed)
  ACUTE VISIT Chief Complaint  Patient presents with   chest discomfort    SOB with walking or exertion. Patient also states he is losing strength in his left arm.   HPI: [name] is a 75 y.o. male, who is here today to follow on recent appt with his endocrinologist,BP was elevated at 150/80 and he c/o chest discomfort.  Reporting chest "discomfort at times" when he was walking a [place] the stadium during [place] football game, about 2 weeks ago. He did not stop, kept walking until he reached the top and rest for 3-4 min before starting walking down.He denies any other episode since then. He usually does not walk hills, so he has no symptoms walking on even ground. Pain was not radiated and he denies associated palpitations,diaphoresis,SOB,cough,wheezing,or dizziness.  HCTZ 12.5 mg was added on 05/20/21 to his antihypertensive regimen but he has not picked up medication. He is on Spironolactone 25 mg daily,Ramipril 10 mg daily,Metoprolol Tartrate 100 mg bid,and Amlodipine 10 mg daily. He is also on Furosemide 20 mg daily prn.  Echo on 06/09/2020 LVEF 60-65% and grade I diastolic dysfunction. Stress test in 09/2015: The left ventricular ejection fraction is normal (55-65%). Nuclear stress EF: 62%. There was no ST segment deviation noted during stress. The study is normal. This is a low risk study.  BP readings at home:Not checking. Side effects:None.  Negative for unusual or severe headache, visual changes, orthopnea,PND,focal weakness, or edema.  Lab Results  Component Value Date   CREATININE 0.79 05/17/2021   BUN 9 05/17/2021   NA 135 05/17/2021   K 4.1 05/17/2021   CL 99 05/17/2021   CO2 27 05/17/2021   He follows with cardiologist annually, on 06/23/20 he saw Dr Lambert. He has not arranged f/u appt.  Review of Systems  Constitutional:  Negative for activity change, appetite change, fatigue and fever.  HENT:  Negative for nosebleeds and sore  throat.   Gastrointestinal:  Negative for abdominal pain, nausea and vomiting.  Genitourinary:  Negative for decreased urine volume and hematuria.  Musculoskeletal:  Negative for gait problem and myalgias.  Neurological:  Negative for dizziness, syncope and facial asymmetry.  Rest see pertinent positives and negatives per HPI.  Current Outpatient Medications on File Prior to Visit  Medication Sig Dispense Refill   Alcohol Swabs (B-D SINGLE USE SWABS REGULAR) PADS Use as necessary 300 each 6   aspirin 81 MG tablet Take 81 mg by mouth daily.     atorvastatin (LIPITOR) 40 MG tablet TAKE 1 TABLET BY MOUTH EVERY DAY 90 tablet 3   Blood Glucose Monitoring Suppl (ONETOUCH VERIO IQ SYSTEM) w/Device KIT USE TO CHECK BLOOD SUGAR TWICE A DAY DX CODE E11.9 1 kit 2   colchicine 0.6 MG tablet Take 1 tablet (0.6 mg total) by mouth daily as needed (for gout attack.). 30 tablet 1   Continuous Blood Gluc Receiver (DEXCOM G6 RECEIVER) DEVI Use to record blood sugars from sensor 1 each 0   EASY COMFORT PEN NEEDLES 31G X 5 MM MISC Used to check  Blood glucose 3 times daily or prn. 100 each 3   Fluocinolone Acetonide Body 0.01 % OIL Apply 1 application topically as directed.     furosemide (LASIX) 20 MG tablet Take 1 tablet (20 mg total) by mouth daily as needed for edema. 90 tablet 1   hydrochlorothiazide (MICROZIDE) 12.5 MG capsule Take 1 capsule (12.5 mg total) by mouth daily. 30 capsule 1     metFORMIN (GLUCOPHAGE) 1000 MG tablet TAKE ONE TABLET BY MOUTH TWICE DAILY. TAKE WITH A MEAL. 180 tablet 1   NOVOLOG FLEXPEN 100 UNIT/ML FlexPen INJECT 20-22 UNITS INTO SKIN PRIOR TO BREAKFAST AND 20-22 UNITS PRIOR TO EVENING MEAL 15 mL 3   nystatin-triamcinolone ointment (MYCOLOG) Daily as needed on affected area. 45 g 1   ONETOUCH DELICA LANCETS 37D MISC Use to check blood sugar twice daily. DX Code E11.9 200 each 1   ramipril (ALTACE) 10 MG capsule Take 1 capsule (10 mg total) by mouth daily. 90 capsule 1   sildenafil  (VIAGRA) 50 MG tablet Take 1 tablet (50 mg total) by mouth daily as needed for erectile dysfunction. 90 tablet 0   spironolactone (ALDACTONE) 25 MG tablet Take 1 tablet (25 mg total) by mouth daily. For blood pressure. 90 tablet 2   TRESIBA FLEXTOUCH 100 UNIT/ML FlexTouch Pen INJECT 38 UNITS SUBCUTANEOUSLY ONCE DAILY 15 mL 1   No current facility-administered medications on file prior to visit.   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   PNEUMONIA, LEFT LOWER LOBE 09/08/2009   Rash    No Known Allergies  Social History   Socioeconomic History   Marital status: Widowed    Spouse name: Not on file   Number of children: 2   Years of education: Not on file   Highest education level: Not on file  Occupational History    Comment: Retired from [employer] Use   Smoking status: Never   Smokeless tobacco: Never  Vaping Use   Vaping Use: Never used  Substance and Sexual Activity   Alcohol use: No    Comment: stopped drinking etoh completely in 1999/09/02   Drug use: No   Sexual activity: Yes    Comment: uses viagra  Other Topics Concern   Not on file  Social History Narrative   Lives alone   Retired from [employer] in 1999/09/02   Twin sons, one son died of a stroke this year Sep 01, 2018) at age 45, his living son resides in [place]    His wife died of breast cancer 11 years ago    Social Determinants of Radio broadcast assistant Strain: Low Risk    Difficulty of Paying Living Expenses: Not hard at all  Food Insecurity: No Food Insecurity   Worried About Charity fundraiser in the Last Year: Never true   Arboriculturist in the Last Year: Never true  Transportation Needs: No Transportation Needs   Lack of Transportation (Medical): No   Lack of  Transportation (Non-Medical): No  Physical Activity: Sufficiently Active   Days of Exercise per Week: 5 days   Minutes of Exercise per Session: 50 min  Stress: No Stress Concern Present   Feeling of Stress : Not at all  Social Connections: Moderately Integrated   Frequency of Communication with Friends and Family: Twice a week   Frequency of Social Gatherings with Friends and Family: More than three times a week   Attends Religious Services: More than 4 times per year   Active Member of [employer] or Organizations: Yes   Attends Archivist Meetings: 1 to 4 times per year   Marital Status: Widowed   Vitals:  05/26/21 1000  BP: (!) 142/78  Pulse: 87  Resp: 16  Temp: 98.4 F (36.9 C)  SpO2: 98%   Body mass index is 27.92 kg/m.  Physical Exam Nursing note reviewed.  Constitutional:      General: He is not in acute distress.    Appearance: He is well-developed.  HENT:     Head: Normocephalic and atraumatic.     Mouth/Throat:     Mouth: Mucous membranes are moist.  Eyes:     Conjunctiva/sclera: Conjunctivae normal.  Cardiovascular:     Rate and Rhythm: Normal rate. Rhythm irregular.     Heart sounds: Murmur (SEM I/VI RUSB) heard.  Pulmonary:     Effort: Pulmonary effort is normal. No respiratory distress.     Breath sounds: Normal breath sounds.  Abdominal:     Palpations: Abdomen is soft. There is no hepatomegaly or mass.     Tenderness: There is no abdominal tenderness.  Lymphadenopathy:     Cervical: No cervical adenopathy.  Skin:    General: Skin is warm.     Findings: No erythema or rash.  Neurological:     Mental Status: He is alert and oriented to person, place, and time.     Cranial Nerves: No cranial nerve deficit.     Gait: Gait normal.  Psychiatric:     Comments: Well groomed, good eye contact.     ASSESSMENT AND PLAN:  Mr. Jackston was seen today for chest discomfort.  Diagnoses and all orders for this visit: Orders Placed This Encounter   Procedures   EKG 12-Lead   Chest discomfort One time episode 2 weeks ago and currently asymptomatic. EKG done today: SR with a few PAC's. Inverted P waves inferior leads, normal axis. Anteroseptal QS waves new when compared with prior EKG. He was clearly instructed about warning signs. Avoid exercising until he sees cardiologist. He will call to schedule appt with his cardiologist, he needs to be seen sooner that planned. He voices understanding and agrees with plan. Will message his cardiologist, Dr Quentin Ore.  Resistant hypertension BP today mildly elevated. He is planning on picking up new medication today, HCTZ 12.5 mg daily. Continue Spironolactone 25 mg daily,Ramipril 10 mg daily,Metoprolol Tartrate 100 mg bid,and Amlodipine 10 mg daily. Possible complications of elevated BP discussed. Recommend monitoring BP at home regularly. Low salt diet.  Return in about 5 months (around 10/24/2021).   Marcellene Shivley G. [name], MD  [facility]. [place] office.

## 2021-05-26 ENCOUNTER — Telehealth: Payer: Self-pay | Admitting: Pharmacist

## 2021-05-26 ENCOUNTER — Encounter: Payer: Self-pay | Admitting: Family Medicine

## 2021-05-26 ENCOUNTER — Other Ambulatory Visit: Payer: Self-pay | Admitting: Family Medicine

## 2021-05-26 ENCOUNTER — Ambulatory Visit (INDEPENDENT_AMBULATORY_CARE_PROVIDER_SITE_OTHER): Payer: HMO | Admitting: Family Medicine

## 2021-05-26 VITALS — BP 142/78 | HR 87 | Temp 98.4°F | Resp 16 | Ht 73.0 in | Wt 211.6 lb

## 2021-05-26 DIAGNOSIS — R0789 Other chest pain: Secondary | ICD-10-CM | POA: Diagnosis not present

## 2021-05-26 DIAGNOSIS — I10 Essential (primary) hypertension: Secondary | ICD-10-CM | POA: Diagnosis not present

## 2021-05-26 MED ORDER — ONETOUCH VERIO VI STRP: ORAL_STRIP | 2 refills | Status: DC

## 2021-05-26 NOTE — Addendum Note (Signed)
Addended by: Rodrigo Ran on: 05/26/2021 04:18 PM   Modules accepted: Orders

## 2021-05-26 NOTE — Patient Instructions (Addendum)
A few things to remember from today's visit:  Chest discomfort - Plan: EKG 12-Lead  Essential hypertension  If you need refills please call your pharmacy. Do not use My Chart to request refills or for acute issues that need immediate attention.   Please avoid intense activities until you see your cardiologist. Pick up new blood pressure medication. Monitor blood pressure at home. Call 911 if any chest discomfort ,palpitations,or shortness of breath.   Please be sure medication list is accurate. If a new problem present, please set up appointment sooner than planned today.         Plastic surgeon appt on 06/15/21. Address: [address], [place], [place] [address] Phone: [phone]

## 2021-05-26 NOTE — Chronic Care Management (AMB) (Signed)
Chronic Care Management Pharmacy Assistant   Name: Patrick Wilcox  MRN: [id number] DOB: 1945/07/24  Reason for Encounter: Disease State / Diabetes Assessment Call   Conditions to be addressed/monitored: DMII  Recent office visits:  001/16/2022 [name] MD - Patient was seen for chest discomfort and an additional issue. No medication changes. No follow up noted.  Recent consult visits:  05/20/2021 [name] MD (endocrinology) - Patient was seen for uncontrolled type 2 diabetes mellitus and additional issues. Started HCTZ 12.5 mg daily. Follow up in 3 months.  Hospital visits:  None  Medications: Outpatient Encounter Medications as of 05/26/2021  Medication Sig   Alcohol Swabs (B-D SINGLE USE SWABS REGULAR) PADS Use as necessary   allopurinol (ZYLOPRIM) 300 MG tablet TAKE 1 TABLET BY MOUTH EVERY DAY   amLODipine (NORVASC) 10 MG tablet TAKE 1 TABLET BY MOUTH EVERY DAY   aspirin 81 MG tablet Take 81 mg by mouth daily.   atorvastatin (LIPITOR) 40 MG tablet TAKE 1 TABLET BY MOUTH EVERY DAY   Blood Glucose Monitoring Suppl (ONETOUCH VERIO IQ SYSTEM) w/Device KIT USE TO CHECK BLOOD SUGAR TWICE A DAY DX CODE E11.9   colchicine 0.6 MG tablet Take 1 tablet (0.6 mg total) by mouth daily as needed (for gout attack.).   Continuous Blood Gluc Receiver (DEXCOM G6 RECEIVER) DEVI Use to record blood sugars from sensor   EASY COMFORT PEN NEEDLES 31G X 5 MM MISC Used to check  Blood glucose 3 times daily or prn.   Fluocinolone Acetonide Body 0.01 % OIL Apply 1 application topically as directed.   furosemide (LASIX) 20 MG tablet Take 1 tablet (20 mg total) by mouth daily as needed for edema.   hydrochlorothiazide (MICROZIDE) 12.5 MG capsule Take 1 capsule (12.5 mg total) by mouth daily.   metFORMIN (GLUCOPHAGE) 1000 MG tablet TAKE ONE TABLET BY MOUTH TWICE DAILY. TAKE WITH A MEAL.   metoprolol tartrate (LOPRESSOR) 100 MG tablet TAKE 1 TABLET BY MOUTH TWICE A DAY   NOVOLOG FLEXPEN 100 UNIT/ML  FlexPen INJECT 20-22 UNITS INTO SKIN PRIOR TO BREAKFAST AND 20-22 UNITS PRIOR TO EVENING MEAL   nystatin-triamcinolone ointment (MYCOLOG) Daily as needed on affected area.   ONETOUCH DELICA LANCETS 22L MISC Use to check blood sugar twice daily. DX Code E11.9   ONETOUCH VERIO test strip USE AS INSTRUCTED   ramipril (ALTACE) 10 MG capsule Take 1 capsule (10 mg total) by mouth daily.   sildenafil (VIAGRA) 50 MG tablet Take 1 tablet (50 mg total) by mouth daily as needed for erectile dysfunction.   spironolactone (ALDACTONE) 25 MG tablet Take 1 tablet (25 mg total) by mouth daily. For blood pressure.   TRESIBA FLEXTOUCH 100 UNIT/ML FlexTouch Pen INJECT 38 UNITS SUBCUTANEOUSLY ONCE DAILY   No facility-administered encounter medications on file as of 05/26/2021.   Fill History: ALLOPURINOL 300 MG TABLET 02/11/2021 90   ATORVASTATIN 40 MG TABLET 05/10/2021 90   COLCHICINE 0.6MG TABLET 03/29/2021 90   FUROSEMIDE 20 MG TABLET 03/29/2021 90   NOVOLOG 100 UNIT/ML FLEXPEN 03/29/2021 34   TRESIBA FLEXTOUCH 100 UNIT/ML 05/12/2021 30   RAMIPRIL 10 MG CAPSULE 03/29/2021 90   SPIRONOLACTONE 25 MG TABLET 03/29/2021 90   AMLODIPINE BESYLATE 10 MG TAB 03/29/2021 90   HYDROCHLOROTHIAZIDE 12.5MG CAPSULE 05/20/2021 30   METFORMIN HCL 1,000 MG TABLET 05/10/2021 90   Recent Relevant Labs: Lab Results  Component Value Date/Time   HGBA1C 7.5 (H) 05/17/2021 08:48 AM   HGBA1C 7.5 (H) 02/11/2021  08:48 AM   MICROALBUR 93.4 (H) 02/11/2021 08:48 AM   MICROALBUR 84.7 (H) 07/30/2020 08:31 AM    Kidney Function Lab Results  Component Value Date/Time   CREATININE 0.79 05/17/2021 08:48 AM   CREATININE 0.87 02/11/2021 08:48 AM   GFR 86.85 05/17/2021 08:48 AM   GFRNONAA 121 09/19/2008 08:59 AM   GFRAA 147 09/19/2008 08:59 AM    Current antihyperglycemic regimen:  Metformin 1000 mg twice daily Novolog Flexpen 100 un/ml INJECT 20-22 UNITS INTO SKIN PRIOR TO BREAKFAST AND 20-22 UNITS PRIOR TO EVENING  MEAL Tresiba Flextouch 100 un/ml INJECT 38 UNITS SUBCUTANEOUSLY ONCE DAILY  What recent interventions/DTPs have been made to improve glycemic control:  None  Have there been any recent hospitalizations or ED visits since last visit with CPP? No  Patient denies hypoglycemic symptoms, including None  Patient denies hyperglycemic symptoms, including none  How often are you checking your blood sugar? once daily  What are your blood sugars ranging?  Fasting: 05/24/21 113                     05/25/21 88                     05/26/21 120  During the week, how often does your blood glucose drop below 70? Never  Are you checking your feet daily/regularly?   Adherence Review: Is the patient currently on a STATIN medication? Yes Is the patient currently on ACE/ARB medication? Yes Does the patient have >5 day gap between last estimated fill dates? No  Care Gaps: AWV - scheduled for 02/02/22 Colonoscopy - overdue  Covid-19 vaccine - overdue Eye exam - overdue Last BP - 142/78 on 05/26/2021 Last A1C - 7.5 on 05/12/2021  Star Rating Drugs: Atorvastatin 73m - [facility] Metformin 10053m- last filled on [facility]10[facility]20[facility] Ramipril 106m [phone]

## 2021-05-27 ENCOUNTER — Encounter: Payer: Self-pay | Admitting: Family Medicine

## 2021-06-07 NOTE — Progress Notes (Signed)
Cardiology Office Note Date:  06/08/2021  Patient ID:  Patrick Wilcox, Patrick Wilcox 1946-03-16, MRN [id number] PCP:  [name], [name], MD  Electrophysiologist: Dr. Quentin Ore   Chief Complaint: over due 6 mo, ?CP  History of Present Illness: Patrick Wilcox is a 75 y.o. male with history of CAD (CABG 2003, PCI 2008), HTN, HLD, DM, PVCs  He comes in today to be seen for Dr. Quentin Ore, last seen by him Dec 2021, was doing well, LVEF preserved, low PVC burden, no changes were made. Not felt to require AAD.  He saw his PMD team 05/26/21 mentioning some CP while walking up a hill, EKG was done and recommended to see his cardiologist  EKG was low atrial/ectopic atrial rhythm PACs, no ischemic changes  TODAY He feels well. Says the day he mentioned to his PMD he was at a football game, had walked back and forth a few times to the stadium from their tailgate and felt fine, thinks probably 2+ miles or so. He went to the stadium for the game game and when walking up the stairs to the seats he felt SOB to the point he had to stop and catch his breath (2 stops), once seated felt well, and did not have any recurrent symptoms He did not have CP, though felt perhaps some symptom in his throat, more the SOB. No associated diaphoresis, no palpitations  Walking that amount of stairs is unusual for him, the a week or 2 prior did go to those seats and does not think he felt as SOB. He has no exertional intolerances otherwise, cares for his home, yard, stays busy, no CP, palpitations or SOB prior to or since that day  He denies any dizzy spells, no near syncope or syncope.   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     atorvastatin (LIPITOR) 40 MG tablet TAKE 1 TABLET BY MOUTH EVERY DAY 90 tablet 3   Blood Glucose Monitoring Suppl (ONETOUCH VERIO IQ SYSTEM) w/Device KIT USE TO CHECK BLOOD SUGAR TWICE A DAY DX CODE E11.9 1 kit 2   colchicine 0.6 MG tablet Take 1 tablet (0.6 mg total) by mouth daily as needed (for gout attack.). 30 tablet 1   Continuous Blood Gluc Receiver (DEXCOM G6 RECEIVER) DEVI Use to record blood sugars from sensor 1 each 0   EASY COMFORT PEN NEEDLES 31G X 5 MM MISC Used to check  Blood glucose 3 times daily or prn. 100 each 3   Fluocinolone Acetonide Body 0.01 % OIL Apply 1 application topically as directed.     furosemide (LASIX) 20  MG tablet Take 1 tablet (20 mg total) by mouth daily as needed for edema. 90 tablet 1   glucose blood (ONETOUCH VERIO) test strip Use as instructed 200 strip 2   hydrochlorothiazide (MICROZIDE) 12.5 MG capsule Take 1 capsule (12.5 mg total) by mouth daily. 30 capsule 1   metFORMIN (GLUCOPHAGE) 1000 MG tablet TAKE ONE TABLET BY MOUTH TWICE DAILY. TAKE WITH A MEAL. 180 tablet 1   metoprolol tartrate (LOPRESSOR) 100 MG tablet TAKE 1 TABLET BY MOUTH TWICE A DAY 180 tablet 2   NOVOLOG FLEXPEN 100 UNIT/ML FlexPen INJECT 20-22 UNITS INTO SKIN PRIOR TO BREAKFAST AND 20-22 UNITS PRIOR TO EVENING MEAL 15 mL 3   nystatin-triamcinolone ointment (MYCOLOG) Daily as needed on affected area. 45 g 1   ONETOUCH DELICA LANCETS 44I MISC Use to check blood sugar twice daily. DX Code E11.9 200 each 1    ramipril (ALTACE) 10 MG capsule Take 1 capsule (10 mg total) by mouth daily. 90 capsule 1   sildenafil (VIAGRA) 50 MG tablet Take 1 tablet (50 mg total) by mouth daily as needed for erectile dysfunction. 90 tablet 0   spironolactone (ALDACTONE) 25 MG tablet Take 1 tablet (25 mg total) by mouth daily. For blood pressure. 90 tablet 2   TRESIBA FLEXTOUCH 100 UNIT/ML FlexTouch Pen INJECT 38 UNITS SUBCUTANEOUSLY ONCE DAILY 15 mL 1   No current facility-administered medications for this visit.    Allergies:   Patient has no known allergies.   Social History:  The patient  reports that he has never smoked. He has never used smokeless tobacco. He reports that he does not drink alcohol and does not use drugs.   Family History:  The patient's family history includes Diabetes in his brother and sister; Heart disease in his father and mother.  ROS:  Please see the history of present illness.    All other systems are reviewed and otherwise negative.   PHYSICAL EXAM:  VS:  BP (!) 158/80   Pulse 69   Ht 6' (1.829 m)   Wt 211 lb 9.6 oz (96 kg)   SpO2 97%   BMI 28.70 kg/m  BMI: Body mass index is 28.7 kg/m. Well nourished, well developed, in no acute distress HEENT: normocephalic, atraumatic Neck: no JVD, carotid bruits or masses Cardiac:  RRR;1-2/6SM, no rubs, or gallops Lungs:  CTA b/l, no wheezing, rhonchi or rales Abd: soft, nontender MS: no deformity or atrophy Ext: no edema Skin: warm and dry, no rash Neuro:  No gross deficits appreciated Psych: euthymic mood, full affect   EKG:  Done today and reviewed by myself shows  Low atrial/ectopic/junctional rhythm, 69pm, PR 151m, QRS 1022m (he has a similar looking EKG back in 2012)  June 09, 2020 echo  Normal left ventricular function, 60% Right ventricular function normal Calcification of the aortic valve with mild stenosis   June 16, 2020 ZIO  4.6% PVC burden 3 runs of VT, longest lasting 9 beats at a rate of 110 bpm Heart  rate 48-2 10 average 66 bpm  10/06/2015: stress myoview The left ventricular ejection fraction is normal (55-65%). Nuclear stress EF: 62%. There was no ST segment deviation noted during stress. The study is normal. This is a low risk study.   Recent Labs: 11/10/2020: ALT 25 05/17/2021: BUN 9; Creatinine, Ser 0.79; Potassium 4.1; Sodium 135  11/10/2020: Cholesterol 102; HDL 27.70; LDL Cholesterol 48; Total CHOL/HDL Ratio 4; Triglycerides 129.0; VLDL 25.8   CrCl cannot be calculated (Patient's most  recent lab result is older than the maximum 21 days allowed.).   Wt Readings from Last 3 Encounters:  06/08/21 211 lb 9.6 oz (96 kg)  05/26/21 211 lb 9.6 oz (96 kg)  05/20/21 212 lb 3.2 oz (96.3 kg)     Other studies reviewed: Additional studies/records reviewed today include: summarized above  ASSESSMENT AND PLAN:  CAD ? [place] equivalent? Suspect stairs are unusual level of exertion for him and was uusually SOB Has been some years for an ischemic eval Plan lexiscan stress test, d/w pt, he is agreeable  HTN Just took his meds prior to coming in He is asked to monitor at home   HLD Looked ok in May 2022  PVCs 4.6% burden None today  5. Ectopic atrial rhythm No symptoms of brady Has had a remote EKG similar  No changes at this time  6. SM Mild AS on last year's echo No symptoms   Disposition: F/u with stress test result , BP home readings, back in clinic in 15mo sooner if needed  Current medicines are reviewed at length with the patient today.  The patient did not have any concerns regarding medicines.  SVenetia Night PA-C 06/08/2021 10:04 AM     [facility] [address] [place] [phone](office)  [phone])

## 2021-06-08 ENCOUNTER — Encounter: Payer: Self-pay | Admitting: Physician Assistant

## 2021-06-08 ENCOUNTER — Encounter: Payer: Self-pay | Admitting: Family Medicine

## 2021-06-08 ENCOUNTER — Other Ambulatory Visit: Payer: Self-pay

## 2021-06-08 ENCOUNTER — Ambulatory Visit (INDEPENDENT_AMBULATORY_CARE_PROVIDER_SITE_OTHER): Payer: HMO | Admitting: Physician Assistant

## 2021-06-08 ENCOUNTER — Encounter: Payer: Self-pay | Admitting: *Deleted

## 2021-06-08 VITALS — BP 158/80 | HR 69 | Ht 72.0 in | Wt 211.6 lb

## 2021-06-08 DIAGNOSIS — R079 Chest pain, unspecified: Secondary | ICD-10-CM

## 2021-06-08 DIAGNOSIS — E785 Hyperlipidemia, unspecified: Secondary | ICD-10-CM

## 2021-06-08 DIAGNOSIS — I2510 Atherosclerotic heart disease of native coronary artery without angina pectoris: Secondary | ICD-10-CM

## 2021-06-08 DIAGNOSIS — I10 Essential (primary) hypertension: Secondary | ICD-10-CM | POA: Diagnosis not present

## 2021-06-08 DIAGNOSIS — R9431 Abnormal electrocardiogram [ECG] [EKG]: Secondary | ICD-10-CM

## 2021-06-08 DIAGNOSIS — I491 Atrial premature depolarization: Secondary | ICD-10-CM | POA: Diagnosis not present

## 2021-06-08 NOTE — Patient Instructions (Signed)
Medication Instructions:   Your physician recommends that you continue on your current medications as directed. Please refer to the Current Medication list given to you today.  *If you need a refill on your cardiac medications before your next appointment, please call your pharmacy*   Lab Work:  [place]   If you have labs (blood work) drawn today and your tests are completely normal, you will receive your results only by: [place] (if you have MyChart) OR A paper copy in the mail If you have any lab test that is abnormal or we need to change your treatment, we will call you to review the results.   Testing/Procedures: Your physician has requested that you have a lexiscan myoview. For further information please visit [email or web address]. Please follow instruction sheet, as given.    Follow-Up: At [facility], you and your health needs are our priority.  As part of our continuing mission to provide you with exceptional heart care, we have created designated Provider Care Teams.  These Care Teams include your primary Cardiologist (physician) and Advanced Practice Providers (APPs -  Physician Assistants and Nurse Practitioners) who all work together to provide you with the care you need, when you need it.  We recommend signing up for the patient portal called "MyChart".  Sign up information is provided on this After Visit Summary.  MyChart is used to connect with patients for Virtual Visits (Telemedicine).  Patients are able to view lab/test results, encounter notes, upcoming appointments, etc.  Non-urgent messages can be sent to your provider as well.   To learn more about what you can do with MyChart, go to [email or web address].    Your next appointment:   3 month(s)  The format for your next appointment:   In Person  Provider:   You may see [name], MD or one of the following Advanced Practice Providers on your designated Care Team:   [name],  [place] Le[place] "Jonni Sanger" Chalmers Cater, [place]    Other Instructions

## 2021-06-09 ENCOUNTER — Telehealth: Payer: Self-pay | Admitting: Family Medicine

## 2021-06-09 DIAGNOSIS — E1169 Type 2 diabetes mellitus with other specified complication: Secondary | ICD-10-CM

## 2021-06-09 MED ORDER — BD SWAB SINGLE USE REGULAR PADS: MEDICATED_PAD | 6 refills | Status: DC

## 2021-06-09 MED ORDER — TRESIBA FLEXTOUCH 100 UNIT/ML ~~LOC~~ SOPN: PEN_INJECTOR | SUBCUTANEOUS | 1 refills | Status: DC

## 2021-06-09 MED ORDER — NOVOLOG FLEXPEN 100 UNIT/ML ~~LOC~~ SOPN: PEN_INJECTOR | SUBCUTANEOUS | 3 refills | Status: DC

## 2021-06-09 NOTE — Telephone Encounter (Signed)
Patient came in asking if refills for Alcohol Swabs (B-D SINGLE USE SWABS REGULAR) PADS, NOVOLOG FLEXPEN 100 UNIT/ML FlexPen, TRESIBA FLEXTOUCH 100 UNIT/ML FlexTouch Pen and one strips could [id number] [place] [place] [address] .  Patient would like a call at [phone] once refills have been sent.  Please advise.

## 2021-06-09 NOTE — Telephone Encounter (Signed)
Rx has been sent to preferred pharmacy.  

## 2021-06-10 ENCOUNTER — Telehealth (HOSPITAL_COMMUNITY): Payer: Self-pay

## 2021-06-10 NOTE — Telephone Encounter (Signed)
Spoke with the patient, detailed instructions given. He stated that he would be here for his test. Asked to call back with any questions. S.Fina Heizer EMTP 

## 2021-06-11 ENCOUNTER — Other Ambulatory Visit: Payer: Self-pay

## 2021-06-11 NOTE — Telephone Encounter (Signed)
[facility] 31G x 3/16.

## 2021-06-15 ENCOUNTER — Telehealth: Payer: Self-pay | Admitting: *Deleted

## 2021-06-15 ENCOUNTER — Other Ambulatory Visit: Payer: Self-pay

## 2021-06-15 ENCOUNTER — Encounter: Payer: Self-pay | Admitting: Plastic Surgery

## 2021-06-15 ENCOUNTER — Ambulatory Visit (INDEPENDENT_AMBULATORY_CARE_PROVIDER_SITE_OTHER): Payer: HMO | Admitting: Plastic Surgery

## 2021-06-15 DIAGNOSIS — L989 Disorder of the skin and subcutaneous tissue, unspecified: Secondary | ICD-10-CM | POA: Diagnosis not present

## 2021-06-15 MED ORDER — CLOBETASOL PROPIONATE 0.05 % EX OINT: 1.0000 "application " | TOPICAL_OINTMENT | Freq: Two times a day (BID) | CUTANEOUS | 0 refills | Status: DC

## 2021-06-15 NOTE — Progress Notes (Signed)
Patient ID: Patrick Wilcox, male    DOB: 1946/05/07, 75 y.o.   MRN: [id number]   Chief Complaint  Patient presents with   Consult    The patient is a 75 year old male here for evaluation of a changing skin lesion on his back.  The patient does not recall any incident of trauma or a bug bite states that it has been present for about a year and getting larger.  It does not hurt but it is very aggravating when he tries to sit back or lay down.  It has pedunculated shape with a 2 cm base and a half 6 cm width at its distal portion.  Pictures are in the chart.  He also has what looks to be either psoriasis or eczema on his back and some scattered places on his front.  He has not had this evaluated in the past.  He has a history of coronary artery disease, diabetes, gout, hypertension and cardiac surgery.  His medications are listed below.   Review of Systems  Constitutional: Negative.   Eyes: Negative.   Respiratory: Negative.  Negative for chest tightness.   Cardiovascular: Negative.   Endocrine: Negative.   Genitourinary: Negative.   Musculoskeletal: Negative.   Skin:  Positive for color change and rash.  Hematological: Negative.    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   PNEUMONIA, LEFT LOWER LOBE 09/08/2009   Rash     Past Surgical History:  Procedure Laterality Date   CORONARY ANGIOPLASTY WITH STENT PLACEMENT     CORONARY ARTERY BYPASS GRAFT  2003      Current Outpatient Medications:    Alcohol Swabs (B-D SINGLE USE SWABS REGULAR) PADS, Use as necessary, Disp: 300 each, Rfl: 6   allopurinol (ZYLOPRIM) 300 MG tablet, TAKE 1 TABLET BY MOUTH EVERY DAY, Disp: 90 tablet, Rfl: 2   amLODipine (NORVASC) 10 MG tablet, TAKE 1 TABLET BY MOUTH EVERY DAY,  Disp: 90 tablet, Rfl: 2   aspirin 81 MG tablet, Take 81 mg by mouth daily., Disp: , Rfl:    atorvastatin (LIPITOR) 40 MG tablet, TAKE 1 TABLET BY MOUTH EVERY DAY, Disp: 90 tablet, Rfl: 3   Blood Glucose Monitoring Suppl (ONETOUCH VERIO IQ SYSTEM) w/Device KIT, USE TO CHECK BLOOD SUGAR TWICE A DAY DX CODE E11.9, Disp: 1 kit, Rfl: 2   colchicine 0.6 MG tablet, Take 1 tablet (0.6 mg total) by mouth daily as needed (for gout attack.)., Disp: 30 tablet, Rfl: 1   Continuous Blood Gluc Receiver ([place]) DEVI, Use to record blood sugars from sensor, Disp: 1 each, Rfl: 0   EASY COMFORT PEN NEEDLES 31G X 5 MM MISC, Used to check  Blood glucose 3 times daily or prn., Disp: 100 each, Rfl: 3   Fluocinolone Acetonide Body 0.01 % OIL, Apply 1 application topically as directed., Disp: , Rfl:    furosemide (LASIX) 20 MG tablet, Take 1 tablet (20 mg total) by mouth daily as needed for edema., Disp: 90 tablet, Rfl: 1   glucose blood (ONETOUCH VERIO) test strip, Use as instructed, Disp: 200 strip, Rfl: 2   hydrochlorothiazide (MICROZIDE) 12.5 MG capsule, Take 1 capsule (12.5 mg total) by mouth daily., Disp: 30 capsule, Rfl: 1  insulin aspart (NOVOLOG FLEXPEN) 100 UNIT/ML FlexPen, INJECT 20-22 UNITS INTO SKIN PRIOR TO BREAKFAST AND 20-22 UNITS PRIOR TO EVENING MEAL, Disp: 15 mL, Rfl: 3   insulin degludec (TRESIBA FLEXTOUCH) 100 UNIT/ML FlexTouch Pen, INJECT 38 UNITS SUBCUTANEOUSLY ONCE DAILY, Disp: 15 mL, Rfl: 1   metFORMIN (GLUCOPHAGE) 1000 MG tablet, TAKE ONE TABLET BY MOUTH TWICE DAILY. TAKE WITH A MEAL., Disp: 180 tablet, Rfl: 1   metoprolol tartrate (LOPRESSOR) 100 MG tablet, TAKE 1 TABLET BY MOUTH TWICE A DAY, Disp: 180 tablet, Rfl: 2   nystatin-triamcinolone ointment (MYCOLOG), Daily as needed on affected area., Disp: 45 g, Rfl: 1   ONETOUCH DELICA LANCETS 70L MISC, Use to check blood sugar twice daily. DX Code E11.9, Disp: 200 each, Rfl: 1   ramipril (ALTACE) 10 MG capsule, Take 1 capsule (10 mg  total) by mouth daily., Disp: 90 capsule, Rfl: 1   sildenafil (VIAGRA) 50 MG tablet, Take 1 tablet (50 mg total) by mouth daily as needed for erectile dysfunction., Disp: 90 tablet, Rfl: 0   spironolactone (ALDACTONE) 25 MG tablet, Take 1 tablet (25 mg total) by mouth daily. For blood pressure., Disp: 90 tablet, Rfl: 2   Objective:   Vitals:   06/15/21 1024  BP: (!) 154/81  Pulse: 83  SpO2: 99%    Physical Exam Vitals and nursing note reviewed.  Constitutional:      Appearance: Normal appearance.  HENT:     Head: Normocephalic.  Cardiovascular:     Rate and Rhythm: Normal rate.     Pulses: Normal pulses.  Pulmonary:     Effort: Pulmonary effort is normal. No respiratory distress.  Abdominal:     General: Abdomen is flat. There is no distension.  Skin:    General: Skin is warm.     Capillary Refill: Capillary refill takes less than 2 seconds.     Coloration: Skin is not jaundiced.     Findings: Lesion and rash present. No bruising.  Neurological:     Mental Status: He is alert and oriented to person, place, and time.  Psychiatric:        Mood and Affect: Mood normal.        Behavior: Behavior normal.        Thought Content: Thought content normal.    Assessment & Plan:  Changing skin lesion  Dermatologic problem  We will make referral to Dr. Girtha Rm for further evaluation of the urticaria.  In the meantime I am going to have him try some clobetasol on his chest area to see if that helps.  We will plan to do excision of his back lesion this week.  The patient is in agreement.  Pictures were obtained of the patient and placed in the chart with the patient's or guardian's permission.   [place], DO

## 2021-06-15 NOTE — Chronic Care Management (AMB) (Signed)
  Chronic Care Management   Note  06/15/2021 Name: Patrick Wilcox MRN: [id number] DOB: 1945-12-08  Patrick Wilcox is a 75 y.o. year old male who is a primary care patient of [name], [name], MD and is actively engaged with the care management team. I reached out to [name] by phone today to assist with scheduling an initial visit with the RN Case Manager  Follow up plan: Telephone appointment with care management team member scheduled for:07/19/21  [place], [place] Management  Direct Dial: [phone]

## 2021-06-16 ENCOUNTER — Ambulatory Visit (INDEPENDENT_AMBULATORY_CARE_PROVIDER_SITE_OTHER): Payer: HMO | Admitting: Plastic Surgery

## 2021-06-16 ENCOUNTER — Other Ambulatory Visit (HOSPITAL_COMMUNITY)
Admission: RE | Admit: 2021-06-16 | Discharge: 2021-06-16 | Disposition: A | Discharge status: Home / Self Care | Payer: HMO | Source: Ambulatory Visit | Attending: Plastic Surgery | Admitting: Plastic Surgery

## 2021-06-16 ENCOUNTER — Encounter: Payer: Self-pay | Admitting: Plastic Surgery

## 2021-06-16 ENCOUNTER — Other Ambulatory Visit: Payer: Self-pay

## 2021-06-16 VITALS — BP 133/86 | HR 72 | Ht 72.0 in | Wt 211.0 lb

## 2021-06-16 DIAGNOSIS — L989 Disorder of the skin and subcutaneous tissue, unspecified: Secondary | ICD-10-CM

## 2021-06-16 DIAGNOSIS — D171 Benign lipomatous neoplasm of skin and subcutaneous tissue of trunk: Secondary | ICD-10-CM

## 2021-06-16 NOTE — Progress Notes (Signed)
Procedure Note  Preoperative Dx: Excision of back mass/skin lesion  Postoperative Dx: Same  Procedure: Excision of back lesion 2 cm  Anesthesia: Lidocaine 1% with 1:100,000 epinephrine   Description of Procedure: Risks and complications were explained to the patient.  Consent was confirmed and the patient understands the risks and benefits.  The potential complications and alternatives were explained and the patient consents.  The patient expressed understanding the option of not having the procedure and the risks of a scar.  Time out was called and all information was confirmed to be correct.    The area was prepped and drapped.  Lidocaine 1% with epinepherine was injected in the subcutaneous area.  After waiting several minutes for the local to take affect a #15 blade was used to excise the area in an eliptical pattern.   The skin edges were reapproximated with 4-0 Monocryl.  A dressing was applied.  The patient was given instructions on how to care for the area and a follow up appointment.  Patrick Wilcox tolerated the procedure well and there were no complications. The specimen was sent to pathology.

## 2021-06-16 NOTE — Addendum Note (Signed)
Addended by: Wallace Going on: 06/16/2021 03:32 PM   Modules accepted: Orders

## 2021-06-17 ENCOUNTER — Other Ambulatory Visit: Payer: Self-pay | Admitting: Endocrinology

## 2021-06-17 ENCOUNTER — Ambulatory Visit (HOSPITAL_COMMUNITY): Payer: HMO | Attending: Cardiology

## 2021-06-17 DIAGNOSIS — R079 Chest pain, unspecified: Secondary | ICD-10-CM | POA: Diagnosis not present

## 2021-06-17 DIAGNOSIS — R9431 Abnormal electrocardiogram [ECG] [EKG]: Secondary | ICD-10-CM | POA: Diagnosis not present

## 2021-06-17 LAB — MYOCARDIAL PERFUSION IMAGING
LV dias vol: 137 mL (ref 62–150)
LV sys vol: 77 mL
Nuc Stress EF: 44 %
Peak HR: 82 {beats}/min
Rest HR: 65 {beats}/min
Rest Nuclear Isotope Dose: 10.2 mCi
SDS: 3
SRS: 0
SSS: 3
ST Depression (mm): 0 mm
Stress Nuclear Isotope Dose: 30.6 mCi
TID: 1.03

## 2021-06-17 MED ORDER — REGADENOSON 0.4 MG/5ML IV SOLN
0.4000 mg | Freq: Once | INTRAVENOUS | Status: AC
  Administered 2021-06-17: 0.4 mg via INTRAVENOUS

## 2021-06-17 MED ORDER — TECHNETIUM TC 99M TETROFOSMIN IV KIT
31.9000 | PACK | Freq: Once | INTRAVENOUS | Status: AC | PRN
  Administered 2021-06-17: 31.9 via INTRAVENOUS
  Filled 2021-06-17: qty 32

## 2021-06-17 MED ORDER — TECHNETIUM TC 99M TETROFOSMIN IV KIT
10.7000 | PACK | Freq: Once | INTRAVENOUS | Status: AC | PRN
  Administered 2021-06-17: 10.7 via INTRAVENOUS
  Filled 2021-06-17: qty 11

## 2021-06-18 ENCOUNTER — Ambulatory Visit: Payer: HMO | Admitting: Family Medicine

## 2021-06-21 ENCOUNTER — Telehealth: Payer: Self-pay | Admitting: *Deleted

## 2021-06-21 DIAGNOSIS — R9430 Abnormal result of cardiovascular function study, unspecified: Secondary | ICD-10-CM

## 2021-06-21 NOTE — Telephone Encounter (Signed)
Spoke with patient aware of results and verbalized understanding. Patient awaiting call for echocardiogram to be scheduled.

## 2021-06-23 LAB — SURGICAL PATHOLOGY

## 2021-06-25 NOTE — Progress Notes (Signed)
Patient is a 75 year old male with PMH of changing skin lesion on his back s/p excision performed 06/16/2021 by Dr. Marla Roe.  I reviewed procedural note and skin edges were reapproximated with 4-0 Monocryl.  Pathology was sent and is consistent with mature adipose tissue with delicate vascular spaces and thin septa.  No atypia noted.  He presents to clinic for follow-up.  Patient is doing well, denies any complaints.  He has not changed his Mepilex border dressing since time of procedure.  He reports that he lives alone and the person who is going to assist him with his dressing changes came down with influenza.  They are feeling improved now.  Small wound appreciated on exam at inferior aspect of excision site.  Applied bacitracin here in the office and covered with a bandage.  Cleaned the surrounding skin well.  The superior aspect of the incision site looks to be well-healed.  Suture at the very top was removed without complication or difficulty.  Patient to return to clinic in 2 weeks for follow-up to ensure good wound healing before he can be cleared.

## 2021-06-29 ENCOUNTER — Other Ambulatory Visit: Payer: Self-pay

## 2021-06-29 ENCOUNTER — Ambulatory Visit (INDEPENDENT_AMBULATORY_CARE_PROVIDER_SITE_OTHER): Payer: HMO | Admitting: Physician Assistant

## 2021-06-29 DIAGNOSIS — L989 Disorder of the skin and subcutaneous tissue, unspecified: Secondary | ICD-10-CM

## 2021-06-29 MED ORDER — BACITRACIN 500 UNIT/GM EX OINT: 1.0000 "application " | TOPICAL_OINTMENT | Freq: Two times a day (BID) | CUTANEOUS | 0 refills | Status: DC

## 2021-07-13 NOTE — Progress Notes (Signed)
Patient is a 76 year old male with PMH of changing skin lesion on his back s/p excision performed 06/16/2021 by Dr. Marla Roe.  I reviewed procedural note and skin edges were reapproximated with 4-0 Monocryl.  Pathology was sent and is consistent with mature adipose tissue with delicate vascular spaces and thin septa.  No atypia noted.  He presents to clinic for follow-up.  Patient was seen most recently on 06/29/2021.  At that time, there is a small wound appreciated on exam at the inferior aspect of excision site.  Bacitracin was applied.  Remainder of exam was entirely reassuring.  Today, patient is doing well.  Denies any pain symptoms or other complaints.  States that he has been applying ointment daily.  The ointment that he had at bedside was his clobetasol ointment prescribed by his dermatologist for a rash.  I emphasized the importance of not using steroid ointment instead applying Vaseline twice daily.  The wound appears dry, but healing.  Remainder of excision site superiorly has healed nicely.  No specific follow-up needed, particularly if wound continues to heal nicely.  If it remains persistent or gets worse or if he has any other concerns she can certainly return to clinic for follow-up evaluation.  Provided him with his pathology report.  Picture(s) obtained of the patient and placed in the chart were with the patient's or guardian's permission.

## 2021-07-14 ENCOUNTER — Ambulatory Visit (INDEPENDENT_AMBULATORY_CARE_PROVIDER_SITE_OTHER): Payer: HMO | Admitting: Physician Assistant

## 2021-07-14 ENCOUNTER — Other Ambulatory Visit: Payer: Self-pay

## 2021-07-14 DIAGNOSIS — L989 Disorder of the skin and subcutaneous tissue, unspecified: Secondary | ICD-10-CM

## 2021-07-18 ENCOUNTER — Other Ambulatory Visit: Payer: Self-pay | Admitting: Endocrinology

## 2021-07-19 ENCOUNTER — Ambulatory Visit (INDEPENDENT_AMBULATORY_CARE_PROVIDER_SITE_OTHER): Payer: HMO

## 2021-07-19 DIAGNOSIS — I10 Essential (primary) hypertension: Secondary | ICD-10-CM

## 2021-07-19 DIAGNOSIS — Z794 Long term (current) use of insulin: Secondary | ICD-10-CM

## 2021-07-19 DIAGNOSIS — I2510 Atherosclerotic heart disease of native coronary artery without angina pectoris: Secondary | ICD-10-CM

## 2021-07-19 DIAGNOSIS — E7800 Pure hypercholesterolemia, unspecified: Secondary | ICD-10-CM

## 2021-07-19 DIAGNOSIS — E1169 Type 2 diabetes mellitus with other specified complication: Secondary | ICD-10-CM

## 2021-07-19 NOTE — Patient Instructions (Addendum)
Visit Information   Thank you for taking time to visit with me today. Please don't hesitate to contact me if I can be of assistance to you before our next scheduled telephone appointment.  Following are the goals we discussed today:  Take all medications as prescribed Attend all scheduled provider appointments Call pharmacy for medication refills 3-7 days in advance of running out of medications Attend church or other social activities Call provider office for new concerns or questions  keep appointment with eye doctor check blood sugar at prescribed times: once daily and when you have symptoms of low or high blood sugar check feet daily for cuts, sores or redness enter blood sugar readings and medication or insulin into daily log trim toenails straight across drink 6 to 8 glasses of water each day fill half of plate with vegetables limit fast food meals to no more than 1 per week manage portion size switch to sugar-free drinks check blood pressure 3 times per week choose a place to take my blood pressure (home, clinic or office, retail store) take blood pressure log to all doctor appointments call doctor for signs and symptoms of high blood pressure eat more whole grains, fruits and vegetables, lean meats and healthy fats limit salt intake to 2371m/day call for medicine refill 2 or 3 days before it runs out take all medications exactly as prescribed call doctor with any symptoms you believe are related to your medicine Type 2 Diabetes Mellitus, Self-Care, Adult When you have type 2 diabetes (type 2 diabetes mellitus), you must make sure your blood sugar (glucose) stays in a healthy range. You can do this with: Nutrition. Exercise. Lifestyle changes. Medicines or insulin, if needed. Support from your doctors and others. What are the risks? Having type 2 diabetes can raise your risk for other long-term (chronic) health problems. You may get medicines to help prevent these  problems. How to stay aware of your blood sugar  Check your blood sugar level every day, as often as told. Have your A1C (hemoglobin A1C) level checked two or more times a year. Have it checked more often if told. Your doctor will set personal treatment goals for you. In general, you should have these blood sugar levels: Before meals: 80-130 mg/dL (4.4-7.2 mmol/L). After meals: below 180 mg/dL (10 mmol/L). A1C: less than 7%. How to manage high and low blood sugar Symptoms of high blood sugar High blood sugar is also called hyperglycemia. Know the symptoms of high blood sugar. These may include: More thirst. Hunger. Feeling very tired. Needing to pee (urinate) more often than normal. Seeing things blurry. Symptoms of low blood sugar Low blood sugar is also called hypoglycemia. This is when blood sugar is at or below 70 mg/dL (3.9 mmol/L). Symptoms may include: Hunger. Feeling worried or nervous (anxious). Feeling sweaty and cold to the touch (clammy). Being dizzy or light-headed. Feeling sleepy. A fast heartbeat. Feeling grouchy (irritable). Tingling or loss of feeling (numbness) around your mouth, lips, or tongue. Restless sleep. Diabetes medicines can cause low blood sugar. You are more at risk: While you exercise. After exercise. During sleep. When you are sick. When you skip meals or do not eat for a long time. Treating low blood sugar If you think you have low blood sugar, eat or drink something sugary right away. Keep 15 grams of a fast-acting carb (carbohydrate) with you all the time. Make sure your family and friends know how to treat you if you cannot treat yourself. Treating very  low blood sugar Severe hypoglycemia is when your blood sugar is at or below 54 mg/dL (3 mmol/L). Severe hypoglycemia is an emergency. Get medical help right away. Call your local emergency services (911 in the [place] Do not wait to see if the symptoms will go away. Do not drive yourself to  the hospital. You may need a glucagon shot if you have very low blood sugar and you cannot eat or drink. Have a family member or friend learn how to check your blood sugar and how to give you a glucagon shot. Ask your doctor if you should have a kit for glucagon shots. Follow these instructions at home: Medicines Take prescribed insulin or diabetes medicines as told by your health care provider. Do not run out of insulin or other medicines. Plan ahead. If you use insulin, change the amount you take based on how active you are and what foods you eat. Your doctor will tell you how to do this. Take over-the-counter and prescription medicines only as told by your doctor. Eating and drinking  Eat healthy foods. These include: Low-fat (lean) proteins. Complex carbs, such as whole grains. Fresh fruits and vegetables. Low-fat dairy products. Healthy fats. Meet with a food expert (dietitian) to make an eating plan. Follow instructions from your doctor about what you cannot eat or drink. Drink enough fluid to keep your pee (urine) pale yellow. Keep track of carbs that you eat. Read food labels and learn serving sizes of foods. Follow your sick-day plan when you cannot eat or drink as normal. Make this plan with your doctor so it is ready to use. Activity Exercise as told by your doctor. You may need to: Do stretching and strength exercises two or more times a week. Do 150 minutes or more of exercise each week that makes your heart beat faster and makes you sweat. Spread out your exercise over 3 or more days a week. Do not go more than 2 days in a row without exercise. Talk with your doctor before you start a new exercise. Your doctor may tell you to change: How much insulin or medicines you take. How much food you eat. Lifestyle Do not smoke or use any products that contain nicotine or tobacco. If you need help quitting, ask your doctor. If you drink alcohol and your doctor says that it is safe  for you: Limit how much you have to: 0-1 drink a day for women who are not pregnant. 0-2 drinks a day for men. Know how much alcohol is in your drink. In the [place] one drink equals one 12 oz bottle of beer (355 mL), one 5 oz glass of wine (148 mL), or one 1 oz glass of hard liquor (44 mL). Learn to deal with stress. If you need help, ask your doctor. Body care  Stay up to date with your shots (immunizations). Have your eyes and feet checked by a doctor as often as told. Check your skin and feet every day. Check for cuts, bruises, redness, blisters, or sores. Brush your teeth and gums two times a day. Floss one or more times a day. Go to the dentist one or more times every 6 months. Stay at a healthy weight. General instructions Share your diabetes care plan with: Your work or school. People you live with. Carry a card or wear jewelry that says you have diabetes. Keep all follow-up visits. Questions to ask your doctor Do I need to meet with a certified expert in diabetes education  and care? Where can I find a support group? Where to find more information For help and guidance and more information about diabetes, please go to: American Diabetes Association: www.diabetes.org American Association of Diabetes Care and Education Specialists: www.diabeteseducator.org International Diabetes Federation: MemberVerification.ca Summary When you have type 2 diabetes, you must make sure your blood sugar (glucose) stays in a healthy range. You can do this with nutrition, exercise, medicines and insulin, and support from doctors and others. Check your blood sugar every day, or as often as told. Having diabetes can raise your risk for other long-term health problems. You may get medicines to help prevent these problems. Share your diabetes management plan with people at work, school, and home. Keep all follow-up visits. This information is not intended to replace advice given to you by your health care  provider. Make sure you discuss any questions you have with your health care provider. Document Revised: 09/21/2020 Document Reviewed: 09/21/2020 Elsevier Patient Education  2022 [place] next appointment is by telephone on 10/25/21 at 10:45 PM  Please call the care guide team at [phone] if you need to cancel or reschedule your appointment.   If you are experiencing a Mental Health or [place] or need someone to talk to, please call the Suicide and Crisis Lifeline: 988 call the [place] National Suicide Prevention Lifeline: 986-667-0710 or TTY: 225-707-5544 TTY 681-825-7291) to talk to a trained counselor call 1-800-273-TALK (toll free, 24 hour hotline) go to [facility] [address] [place] [phone]) call 911   Following is a copy of your full care plan:  Care Plan : RN Care Manager Plan of Care  Updates made by Dimitri Ped, RN since 07/19/2021 12:00 AM     Problem: Chronic Disease Management and Care Coordination Needs (DM, CAD, HTN,HLD)   Priority: High     Long-Range Goal: Establish Plan of Care for Chronic Disease Management Needs (DM, CAD, HTN,HLD)   Start Date: 07/19/2021  Expected End Date: 07/19/2022  Priority: High  Note:   Current Barriers:  Knowledge Deficits related to plan of care for management of CAD, HTN, HLD, DMII, and Gout  Chronic Disease Management support and education needs related to CAD, HTN, HLD, DMII, and Gout  States he has been feeling good.  States his CBGs range from 80-120 most morning and if he checks later in the day 90-130 at 6 PM.  States he has a low 1-2 times a month if he gets busy and forgets to eat or is more active.  Denies any chest pains, shortness of breath or swelling.  States he only checks his B/P 1-2 times a month.  States he tries to eat healthy but he does like potatoes and fries.    RNCM Clinical Goal(s):  Patient will verbalize understanding of plan for  management of CAD, HTN, HLD, DMII, and Gout as evidenced by voiced adherence to plan of care verbalize basic understanding of  CAD, HTN, HLD, DMII, and Gout disease process and self health management plan as evidenced by voiced understanding and teach back take all medications exactly as prescribed and will call provider for medication related questions as evidenced by dispense report and pt verbalization attend all scheduled medical appointments: Endocrinology 08/31/21, cardiology 09/14/21, CCM pharmD 09/14/21, Dr. [name] 10/18/21 as evidenced by medical records demonstrate Improved adherence to prescribed treatment plan for CAD, HTN, HLD, DMII, and Gout as evidenced by readings within limits, voiced adherence to plan of care  continue to work with RN Care Manager to address care management and care coordination needs related to  CAD, HTN, HLD, DMII, and Gout as evidenced by adherence to CM Team Scheduled appointments through collaboration with RN Care manager, provider, and care team.   Interventions: 1:1 collaboration with primary care provider regarding development and update of comprehensive plan of care as evidenced by provider attestation and co-signature Inter-disciplinary care team collaboration (see longitudinal plan of care) Evaluation of current treatment plan related to  self management and patient's adherence to plan as established by provider   CAD Interventions: (Status:  New goal.) Long Term Goal Assessed understanding of CAD diagnosis Medications reviewed including medications utilized in CAD treatment plan Provided education on importance of blood pressure control in management of CAD Provided education on Importance of limiting foods high in cholesterol Counseled on importance of regular laboratory monitoring as prescribed Counseled on the importance of exercise goals with target of 150 minutes per week Reviewed Importance of taking all medications as prescribed   Diabetes  Interventions:  (Status:  New goal.) Long Term Goal Assessed patient's understanding of A1c goal: <7% Provided education to patient about basic DM disease process Reviewed medications with patient and discussed importance of medication adherence Counseled on importance of regular laboratory monitoring as prescribed Discussed plans with patient for ongoing care management follow up and provided patient with direct contact information for care management team Provided patient with written educational materials related to hypo and hyperglycemia and importance of correct treatment Advised patient, providing education and rationale, to check cbg daily and record, calling provider for findings outside established parameters Screening for signs and symptoms of depression related to chronic disease state  Assessed social determinant of health barriers Lab Results  Component Value Date   HGBA1C 7.5 (H) 05/17/2021   Hyperlipidemia Interventions:  (Status:  New goal.) Long Term Goal Medication review performed; medication list updated in electronic medical record.  Provider established cholesterol goals reviewed Counseled on importance of regular laboratory monitoring as prescribed Provided HLD educational materials Reviewed role and benefits of statin for ASCVD risk reduction  Hypertension Interventions:  (Status:  New goal.) Long Term Goal Last practice recorded BP readings:  BP Readings from Last 3 Encounters:  06/16/21 133/86  06/15/21 (!) 154/81  06/08/21 (!) 158/80  Most recent eGFR/CrCl: No results found for: EGFR  No components found for: CRCL  Evaluation of current treatment plan related to hypertension self management and patient's adherence to plan as established by provider Provided education to patient re: stroke prevention, s/s of heart attack and stroke Reviewed medications with patient and discussed importance of compliance Counseled on the importance of exercise goals with  target of 150 minutes per week Discussed plans with patient for ongoing care management follow up and provided patient with direct contact information for care management team Advised patient, providing education and rationale, to monitor blood pressure daily and record, calling PCP for findings outside established parameters Provided education on prescribed diet low sodium low CHO  Patient Goals/Self-Care Activities: Take all medications as prescribed Attend all scheduled provider appointments Call pharmacy for medication refills 3-7 days in advance of running out of medications Attend church or other social activities Call provider office for new concerns or questions  keep appointment with eye doctor check blood sugar at prescribed times: once daily and when you have symptoms of low or high blood sugar check feet daily for cuts, sores or redness enter blood sugar readings and medication or insulin into  daily log trim toenails straight across drink 6 to 8 glasses of water each day fill half of plate with vegetables limit fast food meals to no more than 1 per week manage portion size switch to sugar-free drinks check blood pressure 3 times per week choose a place to take my blood pressure (home, clinic or office, retail store) take blood pressure log to all doctor appointments call doctor for signs and symptoms of high blood pressure eat more whole grains, fruits and vegetables, lean meats and healthy fats limit salt intake to 2353m/day call for medicine refill 2 or 3 days before it runs out take all medications exactly as prescribed call doctor with any symptoms you believe are related to your medicine  Follow Up Plan:  Telephone follow up appointment with care management team member scheduled for:  10/25/21 The patient has been provided with contact information for the care management team and has been advised to call with any health related questions or concerns.       Consent  to CCM Services: Mr. MHallamwas given information about Chronic Care Management services including:  CCM service includes personalized support from designated clinical staff supervised by his physician, including individualized plan of care and coordination with other care providers 24/7 contact phone numbers for assistance for urgent and routine care needs. Service will only be billed when office clinical staff spend 20 minutes or more in a month to coordinate care. Only one practitioner may furnish and bill the service in a calendar month. The patient may stop CCM services at any time (effective at the end of the month) by phone call to the office staff. The patient will be responsible for cost sharing (co-pay) of up to 20% of the service fee (after annual deductible is met).  Patient agreed to services and verbal consent obtained.   The patient verbalized understanding of instructions, educational materials, and care plan provided today and agreed to receive a mailed copy of patient instructions, educational materials, and care plan.   Telephone follow up appointment with care management team member scheduled for: The patient has been provided with contact information for the care management team and has been advised to call with any health related questions or concerns.

## 2021-07-19 NOTE — Chronic Care Management (AMB) (Signed)
Chronic Care Management   CCM RN Visit Note  07/19/2021 Name: Patrick Wilcox MRN: [id number] DOB: 1945-07-28  Subjective: Patrick Wilcox is a 76 y.o. year old male who is a primary care patient of [name], [name], MD. The care management team was consulted for assistance with disease management and care coordination needs.    Engaged with patient by telephone for initial visit in response to provider referral for case management and/or care coordination services.   Consent to Services:  The patient was given the following information about Chronic Care Management services today, agreed to services, and gave verbal consent: 1. CCM service includes personalized support from designated clinical staff supervised by the primary care provider, including individualized plan of care and coordination with other care providers 2. 24/7 contact phone numbers for assistance for urgent and routine care needs. 3. Service will only be billed when office clinical staff spend 20 minutes or more in a month to coordinate care. 4. Only one practitioner may furnish and bill the service in a calendar month. 5.The patient may stop CCM services at any time (effective at the end of the month) by phone call to the office staff. 6. The patient will be responsible for cost sharing (co-pay) of up to 20% of the service fee (after annual deductible is met). Patient agreed to services and consent obtained.  Patient agreed to services and verbal consent obtained.   Assessment: Review of patient past medical history, allergies, medications, health status, including review of consultants reports, laboratory and other test data, was performed as part of comprehensive evaluation and provision of chronic care management services.   SDOH (Social Determinants of Health) assessments and interventions performed:  SDOH Interventions    Flowsheet Row Most Recent Value  SDOH Interventions   Food Insecurity Interventions Intervention Not  Indicated  Financial Strain Interventions Intervention Not Indicated  Housing Interventions Intervention Not Indicated  Stress Interventions Intervention Not Indicated  Transportation Interventions Intervention Not Indicated        CCM Care Plan  No Known Allergies  Outpatient Encounter Medications as of 07/19/2021  Medication Sig   Alcohol Swabs (B-D SINGLE USE SWABS REGULAR) PADS Use as necessary   allopurinol (ZYLOPRIM) 300 MG tablet TAKE 1 TABLET BY MOUTH EVERY DAY   amLODipine (NORVASC) 10 MG tablet TAKE 1 TABLET BY MOUTH EVERY DAY   aspirin 81 MG tablet Take 81 mg by mouth daily.   atorvastatin (LIPITOR) 40 MG tablet TAKE 1 TABLET BY MOUTH EVERY DAY   bacitracin 500 UNIT/GM ointment Apply 1 application topically 2 (two) times daily.   Blood Glucose Monitoring Suppl (ONETOUCH VERIO IQ SYSTEM) w/Device KIT USE TO CHECK BLOOD SUGAR TWICE A DAY DX CODE E11.9   clobetasol ointment (TEMOVATE) 4.92 % Apply 1 application topically 2 (two) times daily.   colchicine 0.6 MG tablet Take 1 tablet (0.6 mg total) by mouth daily as needed (for gout attack.).   Continuous Blood Gluc Receiver (DEXCOM G6 RECEIVER) DEVI Use to record blood sugars from sensor   EASY COMFORT PEN NEEDLES 31G X 5 MM MISC Used to check  Blood glucose 3 times daily or prn.   Fluocinolone Acetonide Body 0.01 % OIL Apply 1 application topically as directed.   furosemide (LASIX) 20 MG tablet Take 1 tablet (20 mg total) by mouth daily as needed for edema.   glucose blood (ONETOUCH VERIO) test strip Use as instructed   hydrochlorothiazide (MICROZIDE) 12.5 MG capsule TAKE 1 CAPSULE BY MOUTH EVERY DAY  insulin aspart (NOVOLOG FLEXPEN) 100 UNIT/ML FlexPen INJECT 20-22 UNITS INTO SKIN PRIOR TO BREAKFAST AND 20-22 UNITS PRIOR TO EVENING MEAL   insulin degludec (TRESIBA FLEXTOUCH) 100 UNIT/ML FlexTouch Pen INJECT 38 UNITS SUBCUTANEOUSLY ONCE DAILY   metFORMIN (GLUCOPHAGE) 1000 MG tablet TAKE ONE TABLET BY MOUTH TWICE DAILY. TAKE  WITH A MEAL.   metoprolol tartrate (LOPRESSOR) 100 MG tablet TAKE 1 TABLET BY MOUTH TWICE A DAY   nystatin-triamcinolone ointment (MYCOLOG) Daily as needed on affected area.   ONETOUCH DELICA LANCETS 16X MISC Use to check blood sugar twice daily. DX Code E11.9   ramipril (ALTACE) 10 MG capsule Take 1 capsule (10 mg total) by mouth daily.   sildenafil (VIAGRA) 50 MG tablet Take 1 tablet (50 mg total) by mouth daily as needed for erectile dysfunction.   spironolactone (ALDACTONE) 25 MG tablet Take 1 tablet (25 mg total) by mouth daily. For blood pressure.   No facility-administered encounter medications on file as of 07/19/2021.    Patient Active Problem List   Diagnosis Date Noted   Changing skin lesion 06/15/2021   Dermatologic problem 06/15/2021   (HFpEF) heart failure with preserved ejection fraction ([place]) 12/16/2020   Microalbuminuria due to type 2 diabetes mellitus ([place]) 08/05/2020   Hypertension associated with diabetes ([place]) 08/26/2015   PNEUMONIA, LEFT LOWER LOBE 09/08/2009   GOUT 05/20/2008   Type 2 diabetes mellitus with other specified complication ([place]) 09/60/4540   Pure hypercholesterolemia 11/23/2006   Resistant hypertension 11/23/2006   MYOCARDIAL INFARCTION, HX OF 11/23/2006   Coronary atherosclerosis 11/23/2006    Conditions to be addressed/monitored:CAD, HTN, HLD, DMII, and Gout  Care Plan : RN Care Manager Plan of Care  Updates made by Dimitri Ped, RN since 07/19/2021 12:00 AM     Problem: Chronic Disease Management and Care Coordination Needs (DM, CAD, HTN,HLD)   Priority: High     Long-Range Goal: Establish Plan of Care for Chronic Disease Management Needs (DM, CAD, HTN,HLD)   Start Date: 07/19/2021  Expected End Date: 07/19/2022  Priority: High  Note:   Current Barriers:  Knowledge Deficits related to plan of care for management of CAD, HTN, HLD, DMII, and Gout  Chronic Disease Management support and education needs related to CAD, HTN, HLD, DMII, and  Gout  States he has been feeling good.  States his CBGs range from 80-120 most morning and if he checks later in the day 90-130 at 6 PM.  States he has a low 1-2 times a month if he gets busy and forgets to eat or is more active.  Denies any chest pains, shortness of breath or swelling.  States he only checks his B/P 1-2 times a month.  States he tries to eat healthy but he does like potatoes and fries.    RNCM Clinical Goal(s):  Patient will verbalize understanding of plan for management of CAD, HTN, HLD, DMII, and Gout as evidenced by voiced adherence to plan of care verbalize basic understanding of  CAD, HTN, HLD, DMII, and Gout disease process and self health management plan as evidenced by voiced understanding and teach back take all medications exactly as prescribed and will call provider for medication related questions as evidenced by dispense report and pt verbalization attend all scheduled medical appointments: Endocrinology 08/31/21, cardiology 09/14/21, CCM pharmD 09/14/21, Dr. [name] 10/18/21 as evidenced by medical records demonstrate Improved adherence to prescribed treatment plan for CAD, HTN, HLD, DMII, and Gout as evidenced by readings within limits, voiced adherence to plan of care  continue to work with RN Care Manager to address care management and care coordination needs related to  CAD, HTN, HLD, DMII, and Gout as evidenced by adherence to CM Team Scheduled appointments through collaboration with RN Care manager, provider, and care team.   Interventions: 1:1 collaboration with primary care provider regarding development and update of comprehensive plan of care as evidenced by provider attestation and co-signature Inter-disciplinary care team collaboration (see longitudinal plan of care) Evaluation of current treatment plan related to  self management and patient's adherence to plan as established by provider   CAD Interventions: (Status:  New goal.) Long Term Goal Assessed  understanding of CAD diagnosis Medications reviewed including medications utilized in CAD treatment plan Provided education on importance of blood pressure control in management of CAD Provided education on Importance of limiting foods high in cholesterol Counseled on importance of regular laboratory monitoring as prescribed Counseled on the importance of exercise goals with target of 150 minutes per week Reviewed Importance of taking all medications as prescribed   Diabetes Interventions:  (Status:  New goal.) Long Term Goal Assessed patient's understanding of A1c goal: <7% Provided education to patient about basic DM disease process Reviewed medications with patient and discussed importance of medication adherence Counseled on importance of regular laboratory monitoring as prescribed Discussed plans with patient for ongoing care management follow up and provided patient with direct contact information for care management team Provided patient with written educational materials related to hypo and hyperglycemia and importance of correct treatment Advised patient, providing education and rationale, to check cbg daily and record, calling provider for findings outside established parameters Screening for signs and symptoms of depression related to chronic disease state  Assessed social determinant of health barriers Lab Results  Component Value Date   HGBA1C 7.5 (H) 05/17/2021   Hyperlipidemia Interventions:  (Status:  New goal.) Long Term Goal Medication review performed; medication list updated in electronic medical record.  Provider established cholesterol goals reviewed Counseled on importance of regular laboratory monitoring as prescribed Provided HLD educational materials Reviewed role and benefits of statin for ASCVD risk reduction  Hypertension Interventions:  (Status:  New goal.) Long Term Goal Last practice recorded BP readings:  BP Readings from Last 3 Encounters:  06/16/21  133/86  06/15/21 (!) 154/81  06/08/21 (!) 158/80  Most recent eGFR/CrCl: No results found for: EGFR  No components found for: CRCL  Evaluation of current treatment plan related to hypertension self management and patient's adherence to plan as established by provider Provided education to patient re: stroke prevention, s/s of heart attack and stroke Reviewed medications with patient and discussed importance of compliance Counseled on the importance of exercise goals with target of 150 minutes per week Discussed plans with patient for ongoing care management follow up and provided patient with direct contact information for care management team Advised patient, providing education and rationale, to monitor blood pressure daily and record, calling PCP for findings outside established parameters Provided education on prescribed diet low sodium low CHO  Patient Goals/Self-Care Activities: Take all medications as prescribed Attend all scheduled provider appointments Call pharmacy for medication refills 3-7 days in advance of running out of medications Attend church or other social activities Call provider office for new concerns or questions  keep appointment with eye doctor check blood sugar at prescribed times: once daily and when you have symptoms of low or high blood sugar check feet daily for cuts, sores or redness enter blood sugar readings and medication or insulin into  daily log trim toenails straight across drink 6 to 8 glasses of water each day fill half of plate with vegetables limit fast food meals to no more than 1 per week manage portion size switch to sugar-free drinks check blood pressure 3 times per week choose a place to take my blood pressure (home, clinic or office, retail store) take blood pressure log to all doctor appointments call doctor for signs and symptoms of high blood pressure eat more whole grains, fruits and vegetables, lean meats and healthy fats limit  salt intake to 2367m/day call for medicine refill 2 or 3 days before it runs out take all medications exactly as prescribed call doctor with any symptoms you believe are related to your medicine  Follow Up Plan:  Telephone follow up appointment with care management team member scheduled for:  10/25/21 The patient has been provided with contact information for the care management team and has been advised to call with any health related questions or concerns.       Plan:Telephone follow up appointment with care management team member scheduled for:  10/25/21 The patient has been provided with contact information for the care management team and has been advised to call with any health related questions or concerns.  MPeter GarterRN, BJackquline Denmark CDE Care Management Coordinator [facility] ([phone] Mobile [phone]

## 2021-07-28 ENCOUNTER — Ambulatory Visit (HOSPITAL_COMMUNITY): Payer: HMO

## 2021-07-28 ENCOUNTER — Telehealth: Payer: Self-pay | Admitting: Cardiology

## 2021-07-28 ENCOUNTER — Encounter (HOSPITAL_COMMUNITY): Payer: Self-pay

## 2021-07-28 ENCOUNTER — Other Ambulatory Visit: Payer: Self-pay

## 2021-07-28 NOTE — Telephone Encounter (Signed)
New message    Patient came in today for echo ordered by Dr Quentin Ore.  The echo tech brought pt to check out  because he stated that "all of his other test were normal, why is he having an echo"?  Patient did not have echo and want the nurse to call and explain why this test was ordered.  Please call.

## 2021-07-29 NOTE — Telephone Encounter (Signed)
Asymptomatic PVCs Unclear burden.  Will order a 7-day ZIO monitor to assess burden and a surface echocardiogram to assess for any signs of structural heart disease or left ventricular dysfunction.  Educated the patient from office note with Dr. Quentin Ore. Patient verbalized understanding and wishes to reschedule Echo. Will forward for assistance.

## 2021-08-04 ENCOUNTER — Ambulatory Visit (HOSPITAL_COMMUNITY): Payer: HMO | Attending: Cardiovascular Disease

## 2021-08-04 ENCOUNTER — Other Ambulatory Visit: Payer: Self-pay

## 2021-08-04 DIAGNOSIS — I340 Nonrheumatic mitral (valve) insufficiency: Secondary | ICD-10-CM | POA: Diagnosis not present

## 2021-08-04 DIAGNOSIS — R9430 Abnormal result of cardiovascular function study, unspecified: Secondary | ICD-10-CM | POA: Diagnosis not present

## 2021-08-04 DIAGNOSIS — R9439 Abnormal result of other cardiovascular function study: Secondary | ICD-10-CM | POA: Insufficient documentation

## 2021-08-04 DIAGNOSIS — I350 Nonrheumatic aortic (valve) stenosis: Secondary | ICD-10-CM | POA: Diagnosis not present

## 2021-08-04 LAB — ECHOCARDIOGRAM COMPLETE
AR max vel: 1.52 cm2
AV Area VTI: 1.6 cm2
AV Area mean vel: 1.65 cm2
AV Mean grad: 10 mmHg
AV Peak grad: 19 mmHg
Ao pk vel: 2.18 m/s
Area-P 1/2: 4.6 cm2
S' Lateral: 4.3 cm

## 2021-08-04 MED ORDER — PERFLUTREN LIPID MICROSPHERE
1.0000 mL | INTRAVENOUS | Status: AC | PRN
  Administered 2021-08-04: 14:00:00 2 mL via INTRAVENOUS

## 2021-08-10 DIAGNOSIS — E7800 Pure hypercholesterolemia, unspecified: Secondary | ICD-10-CM | POA: Diagnosis not present

## 2021-08-10 DIAGNOSIS — E1169 Type 2 diabetes mellitus with other specified complication: Secondary | ICD-10-CM | POA: Diagnosis not present

## 2021-08-10 DIAGNOSIS — I2510 Atherosclerotic heart disease of native coronary artery without angina pectoris: Secondary | ICD-10-CM

## 2021-08-10 DIAGNOSIS — I10 Essential (primary) hypertension: Secondary | ICD-10-CM

## 2021-08-10 DIAGNOSIS — Z794 Long term (current) use of insulin: Secondary | ICD-10-CM | POA: Diagnosis not present

## 2021-08-19 ENCOUNTER — Telehealth: Payer: Self-pay | Admitting: *Deleted

## 2021-08-19 NOTE — Telephone Encounter (Signed)
Attempted All contact number for patient  and other family contact. I am un able to reach any one due to vm not being set up or not answering calls.

## 2021-08-26 ENCOUNTER — Other Ambulatory Visit: Payer: HMO

## 2021-08-30 NOTE — Progress Notes (Unsigned)
Patient ID: Patrick Wilcox, male   DOB: 03/19/1946, 76 y.o.   MRN: [id number]    Reason for Appointment: Followup for Type 2 Diabetes   History of Present Illness:          Diagnosis: Type 2 diabetes mellitus, date of diagnosis: 2004       Past history:  At diagnosis he was having symptoms of drowsiness, difficulty breathing and blood sugar was reportedly over 600 He was initially treated with insulin but subsequently switched back to oral drugs He may have taken metformin and other medications for some time before going back on insulin His records indicate that he had been taking Amaryl and Januvia before going back on insulin in mid-2014 when his A1c had gone up over 13 Initially was given premixed insulin and then switched to Lantus and Humalog Record of his A1c indicates that his levels previously had been over 8% since 02/2012 He  had much better blood sugar control on his last visit with his changing his lifestyle and being instructed by the nurse educator  His Lantus did not appear to have 24 hour control with once a day injection  Recent history:   INSULIN regimen is described as:  38 units Tresiba daily in pm., Novolog 20 units after breakfast and dinner        Oral hypoglycemic drugs the patient is taking are: Metformin 1 g twice a day     His A1c is down to 7.5    Glucose monitoring, current management and problems identified: He is now taking his NovoLog with the meal and more regularly than before  Also he was adjusting the dose arbitrarily but mostly taking 20 units  However despite asking him to check readings after meals he does not do so and only does readings in the mornings, mostly before eating Has only a few readings after breakfast    Sometimes he has high readings in the morning either from drinking Coke or eating other sweets the night before Fasting readings are averaging mildly increased but as low as 82  He says he is interested in doing CGM  but only if it is not expensive He has continued some walking up to 3 days a week Weight is down 4 pounds He has some protein at breakfast usually However his blood sugar after breakfast in the lab was 244, this was without his NovoLog Although he thinks he is taking his evening [name] consistently he may possibly be taking less insulin sometimes Also only once he was out of metformin but has been getting his insulin regularly as prescribed  No hypoglycemic symptoms reported   DIET: Has seen the dietitian in 2014 and nurse educator in 8/18        Meals: 2 meals per day 9-10 am and 6 pm.   Breakfast is  eggs;  toast, eggs, has fruit for snacks.  Evening meal usually vegetables, tuna salad, chicken.       Compliance with the medical regimen:  Fair    Glucose monitoring: Checking less than 1 times a day        Glucometer:  One Touch Verio    Blood Glucose readings  from meter download   PRE-MEAL Fasting Lunch Dinner Bedtime Overall  Glucose range: 80-155      Mean/median: 108    108   POST-MEAL PC Breakfast PC Lunch PC Dinner  Glucose range:     Mean/median:      Prior  PRE-MEAL Fasting Lunch Dinner Bedtime Overall  Glucose range: 82-171      Mean/median: 129    128   POST-MEAL PC Breakfast PC Lunch PC Dinner  Glucose range:   ?  Mean/median: 132       Exercise: Walking fairly regularly, 4-5/7 days   Weight history:  Wt Readings from Last 3 Encounters:  06/17/21 211 lb (95.7 kg)  06/16/21 211 lb (95.7 kg)  06/15/21 211 lb (95.7 kg)    Glycemic control:   Lab Results  Component Value Date   HGBA1C 7.5 (H) 05/17/2021   HGBA1C 7.5 (H) 02/11/2021   HGBA1C 8.5 (H) 11/10/2020   Lab Results  Component Value Date   MICROALBUR 93.4 (H) 02/11/2021   LDLCALC 48 11/10/2020   CREATININE 0.79 05/17/2021   Lab Results  Component Value Date   FRUCTOSAMINE 305 (H) 06/01/2020   FRUCTOSAMINE 296 (H) 12/31/2019   FRUCTOSAMINE 315 (H) 09/09/2019    Other active  problems: See review of systems   No visits with results within 1 Week(s) from this visit.  Latest known visit with results is:  Appointment on 07/28/2021  Component Date Value Ref Range Status   Area-P 1/2 08/04/2021 4.60  cm2 Final   S' Lateral 08/04/2021 4.30  cm Final   AV Area mean vel 08/04/2021 1.65  cm2 Final   AR max vel 08/04/2021 1.52  cm2 Final   AV Area VTI 08/04/2021 1.60  cm2 Final   Ao pk vel 08/04/2021 2.18  m/s Final   AV Mean grad 08/04/2021 10.0  mmHg Final   AV Peak grad 08/04/2021 19.0  mmHg Final      Allergies as of 08/31/2021   No Known Allergies      Medication List        Accurate as of August 30, 2021  4:51 PM. If you have any questions, ask your nurse or doctor.          allopurinol 300 MG tablet Commonly known as: ZYLOPRIM TAKE 1 TABLET BY MOUTH EVERY DAY   amLODipine 10 MG tablet Commonly known as: NORVASC TAKE 1 TABLET BY MOUTH EVERY DAY   aspirin 81 MG tablet Take 81 mg by mouth daily.   atorvastatin 40 MG tablet Commonly known as: LIPITOR TAKE 1 TABLET BY MOUTH EVERY DAY   B-D SINGLE USE SWABS REGULAR Pads Use as necessary   bacitracin 500 UNIT/GM ointment Apply 1 application topically 2 (two) times daily.   clobetasol ointment 0.05 % Commonly known as: TEMOVATE Apply 1 application topically 2 (two) times daily.   colchicine 0.6 MG tablet Take 1 tablet (0.6 mg total) by mouth daily as needed (for gout attack.).   Dexcom G6 Receiver Devi Use to record blood sugars from sensor   Easy Comfort Pen Needles 31G X 5 MM Misc Generic drug: Insulin Pen Needle Used to check  Blood glucose 3 times daily or prn.   Fluocinolone Acetonide Body 0.01 % Oil Apply 1 application topically as directed.   furosemide 20 MG tablet Commonly known as: LASIX Take 1 tablet (20 mg total) by mouth daily as needed for edema.   hydrochlorothiazide 12.5 MG capsule Commonly known as: MICROZIDE TAKE 1 CAPSULE BY MOUTH EVERY DAY    metFORMIN 1000 MG tablet Commonly known as: GLUCOPHAGE TAKE ONE TABLET BY MOUTH TWICE DAILY. TAKE WITH A MEAL.   metoprolol tartrate 100 MG tablet Commonly known as: LOPRESSOR TAKE 1 TABLET BY MOUTH TWICE A DAY   NovoLOG FlexPen  100 UNIT/ML FlexPen Generic drug: insulin aspart INJECT 20-22 UNITS INTO SKIN PRIOR TO BREAKFAST AND 20-22 UNITS PRIOR TO EVENING MEAL   nystatin-triamcinolone ointment Commonly known as: MYCOLOG Daily as needed on affected area.   OneTouch Delica Lancets 38B Misc Use to check blood sugar twice daily. DX Code E11.9   OneTouch Verio IQ System w/Device Kit USE TO CHECK BLOOD SUGAR TWICE A DAY DX CODE E11.9   OneTouch Verio test strip Generic drug: glucose blood Use as instructed   ramipril 10 MG capsule Commonly known as: ALTACE Take 1 capsule (10 mg total) by mouth daily.   sildenafil 50 MG tablet Commonly known as: VIAGRA Take 1 tablet (50 mg total) by mouth daily as needed for erectile dysfunction.   spironolactone 25 MG tablet Commonly known as: ALDACTONE Take 1 tablet (25 mg total) by mouth daily. For blood pressure.   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 He has never  used smokeless tobacco. He reports that he does not drink alcohol and does not use drugs.    Review of Systems        Lipids:  LDL has been treated to goal with Lipitor 40 mg Has history of CAD, followed by PCP HDL has been low       Lab Results  Component Value Date   CHOL 102 11/10/2020   HDL 27.70 (L) 11/10/2020   LDLCALC 48 11/10/2020   TRIG 129.0 11/10/2020   CHOLHDL 4 11/10/2020       Is high blood pressure has been treated with Norvasc 10 mg, ramipril 10 mg, Aldactone 25 mg and metoprolol, Followed by PCP   He is on Lasix from PCP     BP Readings from Last 3 Encounters:  06/16/21 133/86  06/15/21 (!) 154/81  06/08/21 (!) 158/80   Lab Results  Component Value Date   K 4.1 05/17/2021   Microalbumin is improving   Lab Results  Component Value Date   MICRALBCREAT 39.9 (H) 02/11/2021   MICRALBCREAT 55.6 (H) 07/30/2020   MICRALBCREAT 98.1 (H) 09/09/2019   MICRALBCREAT 13.1 01/75/1025     Complications: Microalbuminuria   Diabetic foot exam was done in 11/20 by his PCP  Last exam with eye doctor  was 4/20, has still not been able to make appointment for follow-up  If he is walking uphill he has a discomfort in the left chest and arm with a slight numb feeling but has not discussed with PCP, this is not new     LABS:  No visits with results within 1 Week(s) from this visit.  Latest known  visit with results is:  Appointment on 07/28/2021  Component Date Value Ref Range Status   Area-P 1/2 08/04/2021 4.60  cm2 Final   S' Lateral 08/04/2021 4.30  cm Final   AV Area mean vel 08/04/2021 1.65  cm2 Final   AR max vel 08/04/2021 1.52  cm2 Final   AV Area VTI 08/04/2021 1.60  cm2 Final   Ao pk vel 08/04/2021 2.18  m/s Final   AV Mean grad 08/04/2021 10.0  mmHg Final   AV Peak grad 08/04/2021 19.0  mmHg Final    Physical Examination:  There were no vitals taken for this visit.  Repeat blood pressure 160/82  Diabetic Foot Exam - Simple   No data  filed       ASSESSMENT/PLAN  Diabetes type 2, On insulin   See history of present illness for detailed discussion of current management, problems identified and blood sugar patterns.  A1c is now 7.5, unchanged  He has been on basal bolus insulin and Metformin  His blood sugars are likely still high after meals when he does not check them including in the lab and it was 172 He forgets to check his readings after meals  Not clear why is inconsistent with his NovoLog and has stopped taking this in the morning despite previously taking 20 units regularly  He may still go off his diet at times  Overall however he has lost weight  Fasting glucose readings are well controlled No hypoglycemia  Plan: Reminded him about the need to check blood sugars about 2 hours after meals instead of just in the morning He is going to call the DME supplier to get the freestyle libre He will also need to in the meantime start checking his sugars at least after breakfast to adjust his NovoLog She will start with 15 units of NovoLog at breakfast  We will adjust the dose further based on his readings 2 hours later He will set up an alarm to monitor his blood sugars 2 hours after any meal He will try to walk more regularly No change in [name] dosage Call if having consistently high readings May need to also cut back on juice at breakfast  HYPERTENSION: Blood pressure is much higher today and persistently high on second measurement 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 Needs adequate blood pressure control because of microalbuminuria also  Foot exam normal except pedal pulses are difficult to palpate  He will also need to discuss his inconsistent symptoms of left chest discomfort with walking uphill with his PCP  Follow-up in 3 months  There are no Patient Instructions on file for this visit.       [name] 08/30/2021, 4:51 PM   Note: This office note was prepared with Dragon voice recognition system technology. Any transcriptional errors that result from this process are unintentional.

## 2021-08-31 ENCOUNTER — Other Ambulatory Visit (INDEPENDENT_AMBULATORY_CARE_PROVIDER_SITE_OTHER): Payer: HMO

## 2021-08-31 ENCOUNTER — Other Ambulatory Visit: Payer: Self-pay

## 2021-08-31 ENCOUNTER — Encounter: Payer: HMO | Admitting: Endocrinology

## 2021-08-31 DIAGNOSIS — E1165 Type 2 diabetes mellitus with hyperglycemia: Secondary | ICD-10-CM

## 2021-08-31 DIAGNOSIS — Z794 Long term (current) use of insulin: Secondary | ICD-10-CM | POA: Diagnosis not present

## 2021-08-31 LAB — BASIC METABOLIC PANEL
BUN: 13 mg/dL (ref 6–23)
CO2: 30 mEq/L (ref 19–32)
Calcium: 9.5 mg/dL (ref 8.4–10.5)
Chloride: 100 mEq/L (ref 96–112)
Creatinine, Ser: 0.94 mg/dL (ref 0.40–1.50)
GFR: 79.09 mL/min (ref >60.00)
Glucose, Bld: 249 mg/dL — ABNORMAL HIGH (ref 70–99)
Potassium: 4.4 mEq/L (ref 3.5–5.1)
Sodium: 137 mEq/L (ref 135–145)

## 2021-08-31 LAB — HEMOGLOBIN A1C: Hgb A1c MFr Bld: 8 % — ABNORMAL HIGH (ref 4.6–6.5)

## 2021-08-31 NOTE — Progress Notes (Signed)
error 

## 2021-09-02 ENCOUNTER — Encounter: Payer: Self-pay | Admitting: Endocrinology

## 2021-09-02 ENCOUNTER — Ambulatory Visit (INDEPENDENT_AMBULATORY_CARE_PROVIDER_SITE_OTHER): Payer: HMO | Admitting: Endocrinology

## 2021-09-02 ENCOUNTER — Other Ambulatory Visit: Payer: Self-pay

## 2021-09-02 VITALS — BP 138/62 | HR 67 | Ht 72.0 in | Wt 218.8 lb

## 2021-09-02 DIAGNOSIS — R809 Proteinuria, unspecified: Secondary | ICD-10-CM | POA: Diagnosis not present

## 2021-09-02 DIAGNOSIS — E1169 Type 2 diabetes mellitus with other specified complication: Secondary | ICD-10-CM

## 2021-09-02 DIAGNOSIS — I10 Essential (primary) hypertension: Secondary | ICD-10-CM

## 2021-09-02 DIAGNOSIS — E1165 Type 2 diabetes mellitus with hyperglycemia: Secondary | ICD-10-CM | POA: Diagnosis not present

## 2021-09-02 DIAGNOSIS — E1129 Type 2 diabetes mellitus with other diabetic kidney complication: Secondary | ICD-10-CM

## 2021-09-02 DIAGNOSIS — Z794 Long term (current) use of insulin: Secondary | ICD-10-CM

## 2021-09-02 MED ORDER — HYDROCHLOROTHIAZIDE 12.5 MG PO CAPS: ORAL_CAPSULE | ORAL | 2 refills | Status: DC

## 2021-09-02 MED ORDER — ONETOUCH VERIO VI STRP: ORAL_STRIP | 2 refills | Status: DC

## 2021-09-02 MED ORDER — ONETOUCH DELICA LANCETS 33G MISC: 1 refills | Status: DC

## 2021-09-02 MED ORDER — TRESIBA FLEXTOUCH 100 UNIT/ML ~~LOC~~ SOPN: PEN_INJECTOR | SUBCUTANEOUS | 1 refills | Status: DC

## 2021-09-02 MED ORDER — FREESTYLE LIBRE 2 SENSOR MISC: 2.0000 | 3 refills | Status: DC

## 2021-09-02 MED ORDER — FREESTYLE LIBRE 2 READER DEVI: 1.0000 | Freq: Once | 0 refills | Status: AC

## 2021-09-02 NOTE — Progress Notes (Signed)
Patient ID: Patrick Wilcox, male   DOB: December 30, 1945, 76 y.o.   MRN: [id number]    Reason for Appointment: Followup for Type 2 Diabetes   History of Present Illness:          Diagnosis: Type 2 diabetes mellitus, date of diagnosis: 2004       Past history:  At diagnosis he was having symptoms of drowsiness, difficulty breathing and blood sugar was reportedly over 600 He was initially treated with insulin but subsequently switched back to oral drugs He may have taken metformin and other medications for some time before going back on insulin His records indicate that he had been taking Amaryl and Januvia before going back on insulin in mid-2014 when his A1c had gone up over 13 Initially was given premixed insulin and then switched to Lantus and Humalog Record of his A1c indicates that his levels previously had been over 8% since 02/2012 He  had much better blood sugar control on his last visit with his changing his lifestyle and being instructed by the nurse educator  His Lantus did not appear to have 24 hour control with once a day injection  Recent history:   INSULIN regimen is described as:  38 units Tresiba daily in pm., Novolog 20 units after breakfast and dinner        Oral hypoglycemic drugs the patient is taking are: Metformin 1 g twice a day     His A1c is 8 compared to 7.5    Glucose monitoring, current management and problems identified: He has been reminded to take his NovoLog consistently with his main meals which are breakfast and dinner with he is taking the insulin up to 2 hours after eating especially in the evening  Timing of insulin has been discussed several times before  Also forgetting to check his blood sugars after his dinner and not clear if some of his readings around midday or after eating breakfast  He has gained about 7 pounds He thinks he is drinking more regular soft drinks now Still unable to get started on freestyle libre even though he was  told to call the supplier to start on this No hypoglycemia reported His lab glucose was 249 after breakfast and he had not taken his NovoLog that morning   DIET: Has seen the dietitian in 2014 and nurse educator in 8/18        Meals: 2 meals per day 9-10 am and 6 pm.   Breakfast is  eggs;  toast, eggs, has fruit for snacks.  Evening meal usually vegetables, tuna salad, chicken.       Compliance with the medical regimen:  Fair    Glucose monitoring: Checking less than 1 times a day        Glucometer:  One Touch Verio    Blood Glucose readings  from meter download   PRE-MEAL Fasting Midday Dinner Bedtime Overall  Glucose range: 96-199 94-264     Mean/median: 125    137   POST-MEAL PC Breakfast PC Lunch PC Dinner  Glucose range:   ?  Mean/median:      Previously:  PRE-MEAL Fasting Lunch Dinner Bedtime Overall  Glucose range: 80-155      Mean/median: 108    108   POST-MEAL PC Breakfast PC Lunch PC Dinner  Glucose range:     Mean/median:         Exercise: Walking fairly regularly, 4-5/7 days   Weight history:  Wt Readings from  Last 3 Encounters:  09/02/21 218 lb 12.8 oz (99.2 kg)  06/17/21 211 lb (95.7 kg)  06/16/21 211 lb (95.7 kg)    Glycemic control:   Lab Results  Component Value Date   HGBA1C 8.0 (H) 08/31/2021   HGBA1C 7.5 (H) 05/17/2021   HGBA1C 7.5 (H) 02/11/2021   Lab Results  Component Value Date   MICROALBUR 93.4 (H) 02/11/2021   LDLCALC 48 11/10/2020   CREATININE 0.94 08/31/2021   Lab Results  Component Value Date   FRUCTOSAMINE 305 (H) 06/01/2020   FRUCTOSAMINE 296 (H) 12/31/2019   FRUCTOSAMINE 315 (H) 09/09/2019    Other active problems: See review of systems   Lab on 08/31/2021  Component Date Value Ref Range Status   Sodium 08/31/2021 137  135 - 145 mEq/L Final   Potassium 08/31/2021 4.4  3.5 - 5.1 mEq/L Final   Chloride 08/31/2021 100  96 - 112 mEq/L Final   CO2 08/31/2021 30  19 - 32 mEq/L Final   Glucose, Bld 08/31/2021  249 (H)  70 - 99 mg/dL Final   BUN 08/31/2021 13  6 - 23 mg/dL Final   Creatinine, Ser 08/31/2021 0.94  0.40 - 1.50 mg/dL Final   GFR 08/31/2021 79.09  >60.00 mL/min Final   Calculated using the CKD-EPI Creatinine Equation (2021)   Calcium 08/31/2021 9.5  8.4 - 10.5 mg/dL Final   Hgb A1c MFr Bld 08/31/2021 8.0 (H)  4.6 - 6.5 % Final   Glycemic Control Guidelines for People with Diabetes:Non Diabetic:  <6%Goal of Therapy: <7%Additional Action Suggested:  >8%       Allergies as of 09/02/2021   No Known Allergies      Medication List        Accurate as of September 02, 2021  9:43 AM. If you have any questions, ask your nurse or doctor.          allopurinol 300 MG tablet Commonly known as: ZYLOPRIM TAKE 1 TABLET BY MOUTH EVERY DAY   amLODipine 10 MG tablet Commonly known as: NORVASC TAKE 1 TABLET BY MOUTH EVERY DAY   aspirin 81 MG tablet Take 81 mg by mouth daily.   atorvastatin 40 MG tablet Commonly known as: LIPITOR TAKE 1 TABLET BY MOUTH EVERY DAY   B-D SINGLE USE SWABS REGULAR Pads Use as necessary   bacitracin 500 UNIT/GM ointment Apply 1 application topically 2 (two) times daily.   clobetasol ointment 0.05 % Commonly known as: TEMOVATE Apply 1 application topically 2 (two) times daily.   colchicine 0.6 MG tablet Take 1 tablet (0.6 mg total) by mouth daily as needed (for gout attack.).   Dexcom G6 Receiver Devi Use to record blood sugars from sensor   Easy Comfort Pen Needles 31G X 5 MM Misc Generic drug: Insulin Pen Needle Used to check  Blood glucose 3 times daily or prn.   Fluocinolone Acetonide Body 0.01 % Oil Apply 1 application topically as directed.   furosemide 20 MG tablet Commonly known as: LASIX Take 1 tablet (20 mg total) by mouth daily as needed for edema.   hydrochlorothiazide 12.5 MG capsule Commonly known as: MICROZIDE TAKE 1 CAPSULE BY MOUTH EVERY DAY What changed:  how much to take how to take this when to take  this additional instructions Changed by: [name], MD   metFORMIN 1000 MG tablet Commonly known as: GLUCOPHAGE TAKE ONE TABLET BY MOUTH TWICE DAILY. TAKE WITH A MEAL.   metoprolol tartrate 100 MG tablet Commonly known as: LOPRESSOR  TAKE 1 TABLET BY MOUTH TWICE A DAY   NovoLOG FlexPen 100 UNIT/ML FlexPen Generic drug: insulin aspart INJECT 20-22 UNITS INTO SKIN PRIOR TO BREAKFAST AND 20-22 UNITS PRIOR TO EVENING MEAL   nystatin-triamcinolone ointment Commonly known as: MYCOLOG Daily as needed on affected area.   OneTouch Delica Lancets 31D Misc Use to check blood sugar twice daily. DX Code E11.9   OneTouch Verio IQ System w/Device Kit USE TO CHECK BLOOD SUGAR TWICE A DAY DX CODE E11.9   OneTouch Verio test strip Generic drug: glucose blood Use as instructed   ramipril 10 MG capsule Commonly known as: ALTACE Take 1 capsule (10 mg total) by mouth daily.   sildenafil 50 MG tablet Commonly known as: VIAGRA Take 1 tablet (50 mg total) by mouth daily as needed for erectile dysfunction.   spironolactone 25 MG tablet Commonly known as: ALDACTONE Take 1 tablet (25 mg total) by mouth daily. For blood pressure.   [name] 100 UNIT/ML FlexTouch Pen Generic drug: insulin degludec INJECT 38 UNITS SUBCUTANEOUSLY ONCE DAILY        Allergies:  No Known Allergies  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   PNEUMONIA, LEFT LOWER LOBE 09/08/2009   Rash     Past Surgical History:  Procedure Laterality Date   CORONARY ANGIOPLASTY WITH STENT PLACEMENT     CORONARY ARTERY BYPASS GRAFT  2003    Family History  Problem Relation Age of Onset   Heart disease Mother    Heart disease Father    Diabetes Sister    Diabetes Brother      Social History:  reports that he has never smoked. He has never used smokeless tobacco. He reports that he does not drink alcohol and does not use drugs.    Review of Systems        Lipids:  LDL has been treated to goal with Lipitor 40 mg Has history of CAD, followed by PCP HDL has been low       Lab Results  Component Value Date   CHOL 102 11/10/2020   HDL 27.70 (L) 11/10/2020   LDLCALC 48 11/10/2020   TRIG 129.0 11/10/2020   CHOLHDL 4 11/10/2020       Is high blood pressure has been treated with Norvasc 10 mg, ramipril 10 mg, HCTZ 12.5, Aldactone 25 mg and metoprolol, Followed by PCP.  Blood pressure is better with adding hydrochlorothiazide Not monitoring at home  He is on Lasix from PCP     BP Readings from Last 3 Encounters:  09/02/21 138/62  06/16/21 133/86  06/15/21 (!) 154/81   Lab Results  Component Value Date   K 4.4 17/61/6073   Complications: Microalbuminuria Microalbumin history:  Lab Results  Component Value Date   MICRALBCREAT 39.9 (H) 02/11/2021   MICRALBCREAT 55.6 (H) 07/30/2020   MICRALBCREAT 98.1 (H) 09/09/2019   MICRALBCREAT 13.1 03/16/2018       Diabetic foot exam was done in 11/20 by his PCP  Last exam with eye doctor  was 4/20 and has not made an appointment despite reminders   LABS:  Lab on 08/31/2021  Component Date Value Ref Range Status   Sodium 08/31/2021 137  135 - 145 mEq/L Final   Potassium 08/31/2021 4.4  3.5 - 5.1 mEq/L Final   Chloride 08/31/2021 100  96 - 112 mEq/L Final   CO2 08/31/2021 30  19 - 32 mEq/L Final   Glucose, Bld 08/31/2021 249 (H)  70 - 99 mg/dL Final   BUN 08/31/2021 13  6 - 23 mg/dL Final   Creatinine, Ser 08/31/2021 0.94  0.40 - 1.50 mg/dL Final   GFR 08/31/2021 79.09  >60.00 mL/min Final   Calculated using the CKD-EPI Creatinine Equation (2021)   Calcium 08/31/2021 9.5  8.4 - 10.5 mg/dL Final   Hgb A1c MFr Bld 08/31/2021 8.0 (H)  4.6 - 6.5 % Final   Glycemic Control Guidelines for People  with Diabetes:Non Diabetic:  <6%Goal of Therapy: <7%Additional Action Suggested:  >8%     Physical Examination:  BP 138/62    Pulse 67    Ht 6' (1.829 m)    Wt 218 lb 12.8 oz (99.2 kg)    SpO2 99%    BMI 29.67 kg/m     ASSESSMENT/PLAN  Diabetes type 2, On insulin   See history of present illness for detailed discussion of current management, problems identified and blood sugar patterns.  A1c is now 8, increased  He has been on basal bolus insulin and Metformin  He is not taking his NovoLog before starting to eat and likely is taking it randomly after eating including 2 hours after dinner  Explained in detail the need for timing the NovoLog to cover his meals and showed him the graph of blood sugars after eating meals as well as the action profile of NovoLog insulin  He generally has good fasting readings with current dose of Tresiba  However occasionally will also eat a large snack such as a sandwich late at night and not take any NovoLog No hypoglycemia  Plan: He needs to cut back on his total calorie intake Stop drinking regular soft drinks  We will send prescription for the freestyle libre to the local pharmacy to see if it is covered  Reminded him to take consistently his NovoLog up to 15 minutes before eating both breakfast and dinner  Check blood sugars consistently at night  Will need to adjust his NovoLog if his blood sugars are over 180 or below 120 after dinner  HYPERTENSION: Blood pressure is improved with adding HCTZ Will need follow-up microalbumin    Follow-up in 3 months  There are no Patient Instructions on file for this visit.       [name] 09/02/2021, 9:43 AM   Note: This office note was prepared with Dragon voice recognition system technology. Any transcriptional errors that result from this process are unintentional.

## 2021-09-02 NOTE — Patient Instructions (Addendum)
Novolog ALWAYS BEFORE meals  Add 5-10 units for larger night snacks  Metformin 2x daily  Less colas  Check blood sugars on waking up 4 days a week  Also check blood sugars about 2 hours after meals and do this after different meals by rotation  Recommended blood sugar levels on waking up are 90-130 and about 2 hours after meal is 130-180  Please bring your blood sugar monitor to each visit, thank you

## 2021-09-10 ENCOUNTER — Telehealth: Payer: Self-pay | Admitting: Pharmacist

## 2021-09-10 NOTE — Chronic Care Management (AMB) (Signed)
? ? ?  Chronic Care Management ?Pharmacy Assistant  ? ?Name: Patrick Wilcox  MRN: [id number] DOB: Nov 19, 1945 ? ?09/14/2021 APPOINTMENT REMINDER ? ? ?[name] was reminded to have all medications, supplements and any blood glucose and blood pressure readings available for review with Jeni Salles, Pharm. D, at his telephone visit on 09/14/2021 at 1:30. ?Patient request a call on his cell phone.  ? ?Questions: ?Have you had any recent office visit or specialist visit outside of [facility]? Patient denies any recent visits outside of [facility].  ? ?Are there any concerns you would like to discuss during your office visit? Patient is quite concerned about all the appointments he is having and tests being done, he doesn't understand why they are being done and would like to discuss this with you. ?Patient is also concerned about what all his medications are for and why he is taking them.  ? ?Are you having any problems obtaining your medications? (Whether it pharmacy issues or cost) Patient denies any issues getting medications.  ? ?If patient has any PAP medications ask if they are having any problems getting their PAP medication or refill? Patient states he is not receiving pap medications.  ? ?Care Gaps: ?AWV - scheduled for 02/02/22 ?Last BP - 138/62 on 09/02/2021 ?Last A1C - 8.0 on 08/31/2021 ?Colonoscopy - overdue  ?Covid-19 vaccine - overdue ?Eye exam - overdue ?  ?Star Rating Drugs: ?Atorvastatin 40mg  - [facility] ?Metformin 1000mg  - last [facility] ?Ramipril 10mg  - [facility] ? ? ?Any gaps in medications fill history? Yes ? ?Gennie Alma CMA  ?Clinical Pharmacist Assistant ?[phone] ? ?

## 2021-09-10 NOTE — Progress Notes (Signed)
Error

## 2021-09-14 ENCOUNTER — Ambulatory Visit: Payer: HMO | Admitting: Cardiology

## 2021-09-14 ENCOUNTER — Telehealth: Payer: Self-pay

## 2021-09-14 ENCOUNTER — Ambulatory Visit (INDEPENDENT_AMBULATORY_CARE_PROVIDER_SITE_OTHER): Payer: HMO | Admitting: Pharmacist

## 2021-09-14 DIAGNOSIS — I10 Essential (primary) hypertension: Secondary | ICD-10-CM

## 2021-09-14 DIAGNOSIS — E1159 Type 2 diabetes mellitus with other circulatory complications: Secondary | ICD-10-CM

## 2021-09-14 DIAGNOSIS — M1A9XX0 Chronic gout, unspecified, without tophus (tophi): Secondary | ICD-10-CM

## 2021-09-14 DIAGNOSIS — Z794 Long term (current) use of insulin: Secondary | ICD-10-CM

## 2021-09-14 DIAGNOSIS — E1169 Type 2 diabetes mellitus with other specified complication: Secondary | ICD-10-CM

## 2021-09-14 DIAGNOSIS — I5032 Chronic diastolic (congestive) heart failure: Secondary | ICD-10-CM

## 2021-09-14 MED ORDER — FUROSEMIDE 20 MG PO TABS: 20.0000 mg | ORAL_TABLET | Freq: Every day | ORAL | 2 refills | Status: DC | PRN

## 2021-09-14 MED ORDER — SPIRONOLACTONE 25 MG PO TABS: 25.0000 mg | ORAL_TABLET | Freq: Every day | ORAL | 2 refills | Status: DC

## 2021-09-14 MED ORDER — COLCHICINE 0.6 MG PO TABS: 0.6000 mg | ORAL_TABLET | Freq: Every day | ORAL | 1 refills | Status: DC | PRN

## 2021-09-14 MED ORDER — RAMIPRIL 10 MG PO CAPS: 10.0000 mg | ORAL_CAPSULE | Freq: Every day | ORAL | 2 refills | Status: DC

## 2021-09-14 NOTE — Patient Instructions (Incomplete)

## 2021-09-14 NOTE — Telephone Encounter (Signed)
-----   Message from [name], [facility] sent at 09/14/2021  2:36 PM EST ----- ?Regarding: Refills ?Hi, ? ?Mr. Jutras [facility] the [facility]: ?-colchicine ?-furosemide ?-ramipril ?-spironolactone ? ?Thanks! ?Maddie ? ?

## 2021-09-14 NOTE — Progress Notes (Cosign Needed)
Chronic Care Management Pharmacy Note  09/14/2021 Name:  Patrick Wilcox MRN:  [id number] DOB:  1945-10-26  Summary: Pt is not checking BGs consistently at home A1c is not at goal < 7%  Recommendations/Changes made from today's visit: -Consider switching Novolog to [place] for adherence and not needed administration timing prior to meals -Consider SGLT2 for further A1c lowering and HF diagnosis -Requested refills for spirolactone, furosemide, ramipril and colchicine  Plan: Follow up in person in 1 week for Freestyle libre training  Subjective: Patrick Wilcox is an 76 y.o. year old male who is a primary patient of [name], [name], MD.  The CCM team was consulted for assistance with disease management and care coordination needs.    Engaged with patient by telephone for follow up visit in response to provider referral for pharmacy case management and/or care coordination services.   Consent to Services:  The patient was given information about Chronic Care Management services, agreed to services, and gave verbal consent prior to initiation of services.  Please see initial visit note for detailed documentation.   Patient Care Team: [name], Patrick G, MD as PCP - General (Family Medicine) Patrick Epley, MD as PCP - Electrophysiology (Cardiology) Patrick Snare, MD as Consulting Physician (Endocrinology) Patrick Wilcox, [facility] as Pharmacist (Pharmacist) Patrick Ped, RN as Case Manager  Recent office visits: 07/26/2020 Patrick [name] MD - Patient was seen for chest discomfort and an additional issue. No medication changes. No follow up noted.  Recent consult visits: 09/02/21 Patrick Snare MD (Endocrinology) - seen for type 2 diabetes with hyperglycemia and other chronic conditions. Prescribed Freestyle Libre 2 Follow up in 3 months.   07/14/21 Patrick Blue, PA-C (plastic surgery): Patient presented for changing skin lesion.  06/29/21 Patrick Blue, PA-C (plastic surgery): Patient  presented for changing skin lesion for follow up from excision on 12/7.  06/16/22 Patrick Hives, DO (plastic surgery): Patient presented for changing skin lesion and excision.  06/08/21 Patrick Standard, PA-C (cardiology): Patient presented for abnormal EKG. Plan to follow up in 3 months for stress test result.  05/20/2021 Patrick Snare MD (endocrinology) - Patient was seen for uncontrolled type 2 diabetes mellitus and additional issues. Started HCTZ 12.5 mg daily. Follow up in 3 months.  Hospital visits: None in previous 6 months  Objective:  Lab Results  Component Value Date   CREATININE 0.94 08/31/2021   BUN 13 08/31/2021   GFR 79.09 08/31/2021   GFRNONAA 121 09/19/2008   GFRAA 147 09/19/2008   NA 137 08/31/2021   K 4.4 08/31/2021   CALCIUM 9.5 08/31/2021   CO2 30 08/31/2021   GLUCOSE 249 (H) 08/31/2021    Lab Results  Component Value Date/Time   HGBA1C 8.0 (H) 08/31/2021 09:17 AM   HGBA1C 7.5 (H) 05/17/2021 08:48 AM   FRUCTOSAMINE 305 (H) 06/01/2020 08:55 AM   FRUCTOSAMINE 296 (H) 12/31/2019 10:23 AM   GFR 79.09 08/31/2021 09:17 AM   GFR 86.85 05/17/2021 08:48 AM   MICROALBUR 93.4 (H) 02/11/2021 08:48 AM   MICROALBUR 84.7 (H) 07/30/2020 08:31 AM    Last diabetic Eye exam:  Lab Results  Component Value Date/Time   HMDIABEYEEXA No Retinopathy 10/09/2017 12:00 AM    Last diabetic Foot exam:  Lab Results  Component Value Date/Time   HMDIABFOOTEX done 08/22/2011 12:00 AM     Lab Results  Component Value Date   CHOL 102 11/10/2020   HDL 27.70 (L) 11/10/2020   LDLCALC 48 11/10/2020   TRIG 129.0  11/10/2020   CHOLHDL 4 11/10/2020    Hepatic Function Latest Ref Rng & Units 11/10/2020 09/09/2019 03/16/2018  Total Protein 6.0 - 8.3 Wilcox/dL 7.5 7.6 7.8  Albumin 3.5 - 5.2 Wilcox/dL 4.3 4.3 4.3  AST 0 - 37 U/L '31 21 26  ' ALT 0 - 53 U/L '25 22 24  ' Alk Phosphatase 39 - 117 U/L 114 119(H) 103  Total Bilirubin 0.2 - 1.2 mg/dL 1.0 1.1 0.7  Bilirubin, Direct 0.0 - 0.3 mg/dL - - -    Lab  Results  Component Value Date/Time   TSH 3.33 09/07/2010 08:04 AM   TSH 1.98 09/19/2008 08:59 AM    CBC Latest Ref Rng & Units 09/07/2010 09/19/2008 08/29/2007  WBC 4.5 - 10.5 K/uL 10.9(H) 9.5 9.3  Hemoglobin 13.0 - 17.0 Wilcox/dL 14.5 14.2 13.6  Hematocrit 39.0 - 52.0 % 41.8 40.2 40.3  Platelets 150.0 - 400.0 K/uL 195.0 175 210    No results found for: VD25OH  Clinical ASCVD: Yes  The ASCVD Risk score (Arnett DK, et al., 2019) failed to calculate for the following reasons:   The patient has a prior MI or stroke diagnosis    Depression screen [facility] 2/9 07/19/2021 01/20/2021 12/16/2020  Decreased Interest 0 0 0  Down, Depressed, Hopeless 0 0 0  PHQ - 2 Score 0 0 0  Some recent data might be hidden      Social History   Tobacco Use  Smoking Status Never  Smokeless Tobacco Never   BP Readings from Last 3 Encounters:  09/02/21 138/62  06/16/21 133/86  06/15/21 (!) 154/81   Pulse Readings from Last 3 Encounters:  09/02/21 67  06/16/21 72  06/15/21 83   Wt Readings from Last 3 Encounters:  09/02/21 218 lb 12.8 oz (99.2 kg)  06/17/21 211 lb (95.7 kg)  06/16/21 211 lb (95.7 kg)   BMI Readings from Last 3 Encounters:  09/02/21 29.67 kg/m  06/17/21 28.62 kg/m  06/16/21 28.62 kg/m    Assessment/Interventions: Review of patient past medical history, allergies, medications, health status, including review of consultants reports, laboratory and other test data, was performed as part of comprehensive evaluation and provision of chronic care management services.   SDOH:  (Social Determinants of Health) assessments and interventions performed: No  SDOH Screenings   Alcohol Screen: Not on file  Depression (PHQ2-9): Low Risk    PHQ-2 Score: 0  Financial Resource Strain: Low Risk    Difficulty of Paying Living Expenses: Not hard at all  Food Insecurity: No Food Insecurity   Worried About Charity fundraiser in the Last Year: Never true   Ran Out of Food in the Last Year: Never true   Housing: Low Risk    Last Housing Risk Score: 0  Physical Activity: Sufficiently Active   Days of Exercise per Week: 5 days   Minutes of Exercise per Session: 50 min  Social Connections: Moderately Integrated   Frequency of Communication with Friends and Family: Twice a week   Frequency of Social Gatherings with Friends and Family: More than three times a week   Attends Religious Services: More than 4 times per year   Active Member of [employer] or Organizations: Yes   Attends Archivist Meetings: 1 to 4 times per year   Marital Status: Widowed  Stress: No Stress Concern Present   Feeling of Stress : Not at all  Tobacco Use: Low Risk    Smoking Tobacco Use: Never   Smokeless Tobacco Use: Never  Passive Exposure: Not on file  Transportation Needs: No Transportation Needs   Lack of Transportation (Medical): No   Lack of Transportation (Non-Medical): No    Did he see eye doctor?  CGM? GLP1?    CCM Care Plan  No Known Allergies  Medications Reviewed Today     Reviewed by Cinda Quest, [place] (Certified Medical Assistant) on 09/02/21 at [phone]  Med List Status: <None>   Medication Order Taking? Sig Documenting Provider Last Dose Status Informant  Alcohol Swabs (B-D SINGLE USE SWABS REGULAR) PADS [id number] Yes Use as necessary Patrick Snare, MD Taking Active   allopurinol (ZYLOPRIM) 300 MG tablet [id number] Yes TAKE 1 TABLET BY MOUTH EVERY DAY [name], Patrick G, MD Taking Active   amLODipine (NORVASC) 10 MG tablet [id number] Yes TAKE 1 TABLET BY MOUTH EVERY DAY [name], Patrick G, MD Taking Active   aspirin 81 MG tablet [id number] Yes Take 81 mg by mouth daily. [provider] Taking Active   atorvastatin (LIPITOR) 40 MG tablet [id number] Yes TAKE 1 TABLET BY MOUTH EVERY DAY [name], Patrick G, MD Taking Active   bacitracin 500 UNIT/GM ointment [id number] Yes Apply 1 application topically 2 (two) times daily. Corena Herter, PA-C Taking Active   Blood Glucose Monitoring Suppl  (ONETOUCH VERIO IQ SYSTEM) w/Device Drucie Opitz [id number] Yes USE TO CHECK BLOOD SUGAR TWICE A DAY DX CODE E11.9 Patrick Snare, MD Taking Active   clobetasol ointment (TEMOVATE) 0.05 % [id number] Yes Apply 1 application topically 2 (two) times daily. Wallace Going, DO Taking Active   colchicine 0.6 MG tablet [id number] Yes Take 1 tablet (0.6 mg total) by mouth daily as needed (for gout attack.). [name], Patrick G, MD Taking Active   Continuous Blood Gluc Receiver ([place]) DEVI [id number] No Use to record blood sugars from sensor  Patient not taking: Reported on 09/02/2021   Patrick Snare, MD Not Taking Active   EASY COMFORT PEN NEEDLES 31G X 5 MM [place] [id number] Yes Used to check  Blood glucose 3 times daily or prn. Marletta Lor, MD Taking Active   Fluocinolone Acetonide Body 0.01 % OIL [id number] No Apply 1 application topically as directed.  Patient not taking: Reported on 09/02/2021   [provider] Not Taking Active   furosemide (LASIX) 20 MG tablet [id number] Yes Take 1 tablet (20 mg total) by mouth daily as needed for edema. [name], Patrick G, MD Taking Active   glucose blood [facility] VERIO) test strip [id number] Yes Use as instructed [name], Patrick G, MD Taking Active   hydrochlorothiazide (MICROZIDE) 12.5 MG capsule [id number] Yes TAKE 1 CAPSULE BY MOUTH EVERY DAY Patrick Snare, MD Taking Active   insulin aspart (NOVOLOG FLEXPEN) 100 UNIT/ML FlexPen [id number] Yes INJECT 20-22 UNITS INTO SKIN PRIOR TO BREAKFAST AND 20-22 UNITS PRIOR TO EVENING MEAL Patrick Snare, MD Taking Active   insulin degludec (TRESIBA FLEXTOUCH) 100 UNIT/ML FlexTouch Pen [id number] Yes INJECT 35 UNITS SUBCUTANEOUSLY ONCE DAILY Patrick Snare, MD Taking Active   metFORMIN (GLUCOPHAGE) 1000 MG tablet [id number] Yes TAKE ONE TABLET BY MOUTH TWICE DAILY. TAKE WITH A MEAL. [name], Patrick G, MD Taking Active   metoprolol tartrate (LOPRESSOR) 100 MG tablet [id number] Yes TAKE 1 TABLET BY MOUTH TWICE A DAY [name], Patrick G, MD  Taking Active   nystatin-triamcinolone ointment [facility]) [id number] No Daily as needed on affected area.  Patient not taking: Reported on 09/02/2021   [name], Patrick G, MD Not Taking Active   [facility] LANCETS 02B MISC [id number] Yes Use to check  blood sugar twice daily. DX Code E11.9 Patrick Snare, MD Taking Active   ramipril (ALTACE) 10 MG capsule [id number] Yes Take 1 capsule (10 mg total) by mouth daily. [name], Patrick G, MD Taking Active   sildenafil (VIAGRA) 50 MG tablet [id number] No Take 1 tablet (50 mg total) by mouth daily as needed for erectile dysfunction.  Patient not taking: Reported on 09/02/2021   [name], Patrick G, MD Not Taking Active   spironolactone (ALDACTONE) 25 MG tablet [id number] Yes Take 1 tablet (25 mg total) by mouth daily. For blood pressure. [name], Patrick G, MD Taking Active             Patient Active Problem List   Diagnosis Date Noted   Changing skin lesion 06/15/2021   Dermatologic problem 06/15/2021   (HFpEF) heart failure with preserved ejection fraction ([place]) 12/16/2020   Microalbuminuria due to type 2 diabetes mellitus ([place]) 08/05/2020   Hypertension associated with diabetes ([place]) 08/26/2015   PNEUMONIA, LEFT LOWER LOBE 09/08/2009   GOUT 05/20/2008   Type 2 diabetes mellitus with other specified complication ([place]) 00/71/2197   Pure hypercholesterolemia 11/23/2006   Resistant hypertension 11/23/2006   MYOCARDIAL INFARCTION, HX OF 11/23/2006   Coronary atherosclerosis 11/23/2006    Immunization History  Administered Date(s) Administered   Fluad Quad(high Dose 65+) 05/15/2019, 04/08/2020, 05/20/2021   Influenza Split 04/13/2011, 05/31/2012   Influenza Whole 05/21/2007, 06/22/2009, 06/15/2010   Influenza, High Dose Seasonal PF 05/02/2013, 03/31/2016, 06/18/2018   Influenza,inj,Quad PF,6+ Mos 04/02/2014, 03/30/2015, 06/08/2017   PFIZER(Purple Top)SARS-COV-2 Vaccination 09/12/2019, 10/04/2019, 06/20/2020   Pneumococcal Conjugate-13 08/26/2015    Pneumococcal Polysaccharide-23 05/02/2013   Tetanus 08/02/2013   Zoster Recombinat (Shingrix) 06/20/2020, 09/08/2020   Patient reports he is having a lot of difficulty with his pharmacy and getting medications filled on time. He thought he was on automatic refills at one point but this stopped. He also was getting 90 ds at a time and this also stopped. He has little faith in switching to mail order because they can't even get it right at the store. He is also running out of several medications and would like refills on the following: colchicine, furosemide, HCTZ, ramipril, and spironolactone.  He also is very frustrated with cardiology right now as they are running several tests and haven't gone over the results with him. He was unaware that he even had an appt with cardiology today and missed it.  Conditions to be addressed/monitored:  Hypertension, Hyperlipidemia, Diabetes and Gout  Conditions addressed this visit: Hypertension, diabetes  Care Plan : CCM Pharmacy Care Plan  Updates made by Patrick Wilcox, [place] since 09/14/2021 12:00 AM     Problem: Problem: Hypertension, Hyperlipidemia, Diabetes and Gout      Long-Range Goal: Patient-Specific Goal   Start Date: 11/09/2020  Expected End Date: 11/09/2021  Recent Progress: On track  Priority: High  Note:   Current Barriers:  Unable to independently monitor therapeutic efficacy Unable to achieve control of diabetes   Pharmacist Clinical Goal(s):  Patient will verbalize ability to afford treatment regimen achieve adherence to monitoring guidelines and medication adherence to achieve therapeutic efficacy achieve control of diabetes as evidenced by A1c  through collaboration with PharmD and provider.   Interventions: 1:1 collaboration with [name], Patrick G, MD regarding development and update of comprehensive plan of care as evidenced by provider attestation and co-signature Inter-disciplinary care team collaboration (see longitudinal  plan of care) Comprehensive medication review performed; medication list updated in electronic medical record  Hypertension (BP goal <  140/90) -Not ideally controlled -Current treatment: amlodipine 40m, 1 tablet once daily - Appropriate, Query effective, Safe, Accessible metoprolol tartrate 1047m 1 tablet twice daily - Appropriate, Query effective, Safe, Accessible ramipril 1051m1 capsule once daily  - Appropriate, Query effective, Safe, Accessible Hydrochlorothiazide 12.5 mg 1 capsule daily - Appropriate, Query effective, Safe, Accessible furosemide 52m75m tablet once daily as needed - Appropriate, Query effective, Safe, Accessible sprironolactone  25mg4mtablet once daily - Appropriate, Query effective, Safe, Accessible -Medications previously tried: n/a -Current home readings: could not provide as patient does not check consistently -Current dietary habits: did not discuss -Current exercise habits: did not discuss -Denies hypotensive/hypertensive symptoms -Educated on Daily salt intake goal < 2300 mg; Exercise goal of 150 minutes per week; Importance of home blood pressure monitoring; Proper BP monitoring technique; Symptoms of hypotension and importance of maintaining adequate hydration; -Counseled to monitor BP at home weekly, document, and provide log at future appointments -Counseled on diet and exercise extensively Recommended to continue current medication  Hyperlipidemia/history of MI: (LDL goal < 55) -Controlled -Current treatment: atorvastatin 40mg,77mablet once daily - Appropriate, Effective, Safe, Accessible aspirin 81mg, 84mblet once daily - Appropriate, Effective, Safe, Accessible -Medications previously tried: none  -Current dietary patterns: did not discuss -Current exercise habits: did not discuss -Educated on Cholesterol goals;  Importance of limiting foods high in cholesterol; Exercise goal of 150 minutes per week; -Counseled on diet and exercise  extensively Recommended to continue current medication  Diabetes (A1c goal <7%) -Uncontrolled -Current medications: insulin aspart (Novolog Flexpen), inject 10-12 units at breakfast if needed and 12 units at suppertime - Appropriate, Query effective, Safe, Accessible insulin degludec (TresibTyler Aasuch), inject 40 units at suppertime (changing times) - Appropriate, Query effective, Safe, Accessible metformin 1000mg, 153mlet twice daily - Appropriate, Query effective, Safe, Accessible -Medications previously tried: n/a  -Current home glucose readings fasting glucose: ranging from 90s-150s post prandial glucose: n/a -Denies hypoglycemic/hyperglycemic symptoms -Current meal patterns:  breakfast: did not discuss lunch: did not discuss  dinner: did not discuss snacks: did not discuss drinks: cut back on sodas drinking 2 per week, drinking more water and drinking some gatorade  -Current exercise: patient reports he is walking almost every day and has been active with driving his sister around -Educated on A1c and blood sugar goals; Exercise goal of 150 minutes per week; Benefits of routine self-monitoring of blood sugar; Continuous glucose monitoring; -Counseled to check feet daily and get yearly eye exams -Counseled on diet and exercise extensively Recommended to continue current medication Scheduled for in person visit to discuss CGM set up  Heart Failure (Goal: manage symptoms and prevent exacerbations) -Not ideally controlled -Last ejection fraction: 40-45% (Date: 08/04/21) -HF type: Systolic -NYHA Class: II (slight limitation of activity) -AHA HF Stage: C (Heart disease and symptoms present) -Current treatment: Spironolactone 25 mg 1 tablet daily - Appropriate, Effective, Safe, Accessible Furosemide 20 mg 1 tablet as needed - Appropriate, Effective, Safe, Accessible Ramipril 10 mg 1 capsule daily - Appropriate, Effective, Safe, Accessible -Medications previously tried: unknown   -Current home BP/HR readings: could not provide -Current dietary habits: did not discuss -Current exercise habits: did not discuss -Educated on Benefits of medications for managing symptoms and prolonging life Importance of blood pressure control -Recommended to continue current medication   Gout (Goal: uric acid < 6 and prevent flare ups) -Controlled -Current treatment  allopurinol 300mg, 1 29met once daily  - Appropriate, Effective, Safe, Accessible colchicine 0.6mg, 1 ta4mt once daily  as needed - Appropriate, Effective, Safe, Accessible -Medications previously tried: none  -Recommended to continue current medication  Health Maintenance -Vaccine gaps: none -Current therapy:  No medications -Educated on Cost vs benefit of each product must be carefully weighed by individual consumer -Patient is satisfied with current therapy and denies issues -Recommended to continue as is  Patient Goals/Self-Care Activities Patient will:  - take medications as prescribed focus on medication adherence by setting a timer for evening dose of metoprolol check glucose a few times daily, document, and provide at future appointments check blood pressure weekly, document, and provide at future appointments target a minimum of 150 minutes of moderate intensity exercise weekly  Follow Up Plan: Face to Face appointment with care management team member scheduled for:  1 week      Medication Assistance: None required.  Patient affirms current coverage meets needs.  Compliance/Adherence/Medication fill history: Care Gaps: Eye exam, colonoscopy, COVID booster Last BP - 138/62 on 09/02/2021 Last A1C - 8.0 on 08/31/2021  Star-Rating Drugs: Atorvastatin 62m - [facility] Metformin 1002m- last filled [facility] Ramipril 1051m last [facility]  [facility]:  [place] [place] [place]: [phone]: [phone]/pharmacy #[id number] Gary -[place] [address]  [place] [address]ne: [phone]: [phone], [place] -[place] [address] [place]ne: [phone]: [phone] [place] Address) - [place] -[place]: [address] [place]: [phone]: [phone] pill box? Yes Pt endorses 80% compliance  We discussed: Benefits of medication synchronization, packaging and delivery as well as enhanced pharmacist oversight with [employer]. Patient decided to: Continue current medication management strategy  Care Plan and Follow Up Patient Decision:  Patient agrees to Care Plan and Follow-up.  Plan: Face to Face appointment with care management team member scheduled for: 1 week  MadeJeni SallesarmD BCACMenahgarmacist LeBaSt. JamesBrasNebo-[phone]

## 2021-09-20 ENCOUNTER — Ambulatory Visit: Payer: HMO | Admitting: Pharmacist

## 2021-09-20 VITALS — BP 152/78

## 2021-09-20 DIAGNOSIS — E1159 Type 2 diabetes mellitus with other circulatory complications: Secondary | ICD-10-CM

## 2021-09-20 DIAGNOSIS — I10 Essential (primary) hypertension: Secondary | ICD-10-CM

## 2021-09-20 NOTE — Progress Notes (Cosign Needed)
Chronic Care Management Pharmacy Note  09/20/2021 Name:  Patrick Wilcox MRN:  [id number] DOB:  03/13/1946  Summary: Pt is not checking BP or BGs consistently at home A1c is not at goal < 7% BP elevated in office  Recommendations/Changes made from today's visit: -Consider switching Novolog to [employer] for adherence and not needed administration timing prior to meals -Consider SGLT2 for further A1c lowering and HF diagnosis -Recommended for pt to take BP medications prior to doctor's office appointments  Plan: Follow up BP/DM assessment in 1 month  Subjective: Patrick Wilcox is an 76 y.o. year old male who is a primary patient of [name], [name], MD.  The CCM team was consulted for assistance with disease management and care coordination needs.    Engaged with patient by telephone for follow up visit in response to provider referral for pharmacy case management and/or care coordination services.   Consent to Services:  The patient was given information about Chronic Care Management services, agreed to services, and gave verbal consent prior to initiation of services.  Please see initial visit note for detailed documentation.   Patient Care Team: [name], [name], MD as PCP - General (Family Medicine) [name], MD as PCP - Electrophysiology (Cardiology) [name], MD as Consulting Physician (Endocrinology) [name], [facility] as Pharmacist (Pharmacist) Dimitri Ped, RN as Case Manager  Recent office visits: 07/26/2020 [name] MD - Patient was seen for chest discomfort and an additional issue. No medication changes. No follow up noted.  Recent consult visits: 09/02/21 [name] MD (Endocrinology) - seen for type 2 diabetes with hyperglycemia and other chronic conditions. Prescribed Freestyle Libre 2 Follow up in 3 months.   07/14/21 [name], PA-C (plastic surgery): Patient presented for changing skin lesion.  06/29/21 [name], PA-C (plastic  surgery): Patient presented for changing skin lesion for follow up from excision on 12/7.  06/16/22 [name], DO (plastic surgery): Patient presented for changing skin lesion and excision.  06/08/21 [name], PA-C (cardiology): Patient presented for abnormal EKG. Plan to follow up in 3 months for stress test result.  05/20/2021 [name] MD (endocrinology) - Patient was seen for uncontrolled type 2 diabetes mellitus and additional issues. Started HCTZ 12.5 mg daily. Follow up in 3 months.  Hospital visits: None in previous 6 months  Objective:  Lab Results  Component Value Date   CREATININE 0.94 08/31/2021   BUN 13 08/31/2021   GFR 79.09 08/31/2021   GFRNONAA 121 09/19/2008   GFRAA 147 09/19/2008   NA 137 08/31/2021   K 4.4 08/31/2021   CALCIUM 9.5 08/31/2021   CO2 30 08/31/2021   GLUCOSE 249 (H) 08/31/2021    Lab Results  Component Value Date/Time   HGBA1C 8.0 (H) 08/31/2021 09:17 AM   HGBA1C 7.5 (H) 05/17/2021 08:48 AM   FRUCTOSAMINE 305 (H) 06/01/2020 08:55 AM   FRUCTOSAMINE 296 (H) 12/31/2019 10:23 AM   GFR 79.09 08/31/2021 09:17 AM   GFR 86.85 05/17/2021 08:48 AM   MICROALBUR 93.4 (H) 02/11/2021 08:48 AM   MICROALBUR 84.7 (H) 07/30/2020 08:31 AM    Last diabetic Eye exam:  Lab Results  Component Value Date/Time   HMDIABEYEEXA No Retinopathy 10/09/2017 12:00 AM    Last diabetic Foot exam:  Lab Results  Component Value Date/Time   HMDIABFOOTEX done 08/22/2011 12:00 AM     Lab Results  Component Value Date   CHOL 102 11/10/2020   HDL 27.70 (L) 11/10/2020   [place]  48 11/10/2020   TRIG 129.0 11/10/2020   CHOLHDL 4 11/10/2020    Hepatic Function Latest Ref Rng & Units 11/10/2020 09/09/2019 03/16/2018  Total Protein 6.0 - 8.3 g/dL 7.5 7.6 7.8  Albumin 3.5 - 5.2 g/dL 4.3 4.3 4.3  AST 0 - 37 U/L '31 21 26  ' ALT 0 - 53 U/L '25 22 24  ' Alk Phosphatase 39 - 117 U/L 114 119(H) 103  Total Bilirubin 0.2 - 1.2 mg/dL 1.0 1.1 0.7  Bilirubin, Direct 0.0 - 0.3  mg/dL - - -    Lab Results  Component Value Date/Time   TSH 3.33 09/07/2010 08:04 AM   TSH 1.98 09/19/2008 08:59 AM    CBC Latest Ref Rng & Units 09/07/2010 09/19/2008 08/29/2007  WBC 4.5 - 10.5 K/uL 10.9(H) 9.5 9.3  Hemoglobin 13.0 - 17.0 g/dL 14.5 14.2 13.6  Hematocrit 39.0 - 52.0 % 41.8 40.2 40.3  Platelets 150.0 - 400.0 K/uL 195.0 175 210    No results found for: VD25OH  Clinical ASCVD: Yes  The ASCVD Risk score (Arnett DK, et al., 2019) failed to calculate for the following reasons:   The patient has a prior MI or stroke diagnosis    Depression screen [facility] 2/9 07/19/2021 01/20/2021 12/16/2020  Decreased Interest 0 0 0  Down, Depressed, Hopeless 0 0 0  PHQ - 2 Score 0 0 0  Some recent data might be hidden      Social History   Tobacco Use  Smoking Status Never  Smokeless Tobacco Never   BP Readings from Last 3 Encounters:  09/20/21 (!) 152/78  09/02/21 138/62  06/16/21 133/86   Pulse Readings from Last 3 Encounters:  09/02/21 67  06/16/21 72  06/15/21 83   Wt Readings from Last 3 Encounters:  09/02/21 218 lb 12.8 oz (99.2 kg)  06/17/21 211 lb (95.7 kg)  06/16/21 211 lb (95.7 kg)   BMI Readings from Last 3 Encounters:  09/02/21 29.67 kg/m  06/17/21 28.62 kg/m  06/16/21 28.62 kg/m    Assessment/Interventions: Review of patient past medical history, allergies, medications, health status, including review of consultants reports, laboratory and other test data, was performed as part of comprehensive evaluation and provision of chronic care management services.   SDOH:  (Social Determinants of Health) assessments and interventions performed: No  SDOH Screenings   Alcohol Screen: Not on file  Depression (PHQ2-9): Low Risk    PHQ-2 Score: 0  Financial Resource Strain: Low Risk    Difficulty of Paying Living Expenses: Not hard at all  Food Insecurity: No Food Insecurity   Worried About Charity fundraiser in the Last Year: Never true   Ran Out of Food in the  Last Year: Never true  Housing: Low Risk    Last Housing Risk Score: 0  Physical Activity: Sufficiently Active   Days of Exercise per Week: 5 days   Minutes of Exercise per Session: 50 min  Social Connections: Moderately Integrated   Frequency of Communication with Friends and Family: Twice a week   Frequency of Social Gatherings with Friends and Family: More than three times a week   Attends Religious Services: More than 4 times per year   Active Member of [employer] or Organizations: Yes   Attends Archivist Meetings: 1 to 4 times per year   Marital Status: Widowed  Stress: No Stress Concern Present   Feeling of Stress : Not at all  Tobacco Use: Low Risk    Smoking Tobacco Use: Never  Smokeless Tobacco Use: Never   Passive Exposure: Not on file  Transportation Needs: No Transportation Needs   Lack of Transportation (Medical): No   Lack of Transportation (Non-Medical): No    Did he see eye doctor?  CGM? GLP1?    CCM Care Plan  No Known Allergies  Medications Reviewed Today     Reviewed by Cinda Quest, [place] (Certified Medical Assistant) on 09/02/21 at [phone]  Med List Status: <None>   Medication Order Taking? Sig Documenting Provider Last Dose Status Informant  Alcohol Swabs (B-D SINGLE USE SWABS REGULAR) PADS [id number] Yes Use as necessary [name], MD Taking Active   allopurinol (ZYLOPRIM) 300 MG tablet [id number] Yes TAKE 1 TABLET BY MOUTH EVERY DAY [name], [name], MD Taking Active   amLODipine (NORVASC) 10 MG tablet [id number] Yes TAKE 1 TABLET BY MOUTH EVERY DAY [name], [name], MD Taking Active   aspirin 81 MG tablet [id number] Yes Take 81 mg by mouth daily. [provider] Taking Active   atorvastatin (LIPITOR) 40 MG tablet [id number] Yes TAKE 1 TABLET BY MOUTH EVERY DAY [name], [name], MD Taking Active   bacitracin 500 UNIT/GM ointment [id number] Yes Apply 1 application topically 2 (two) times daily. Corena Herter, PA-C Taking Active   Blood  Glucose Monitoring Suppl (ONETOUCH VERIO IQ SYSTEM) w/Device Drucie Opitz [id number] Yes USE TO CHECK BLOOD SUGAR TWICE A DAY DX CODE E11.9 [name], MD Taking Active   clobetasol ointment (TEMOVATE) 0.05 % [id number] Yes Apply 1 application topically 2 (two) times daily. Wallace Going, DO Taking Active   colchicine 0.6 MG tablet [id number] Yes Take 1 tablet (0.6 mg total) by mouth daily as needed (for gout attack.). [name], [name], MD Taking Active   Continuous Blood Gluc Receiver ([place]) DEVI [id number] No Use to record blood sugars from sensor  Patient not taking: Reported on 09/02/2021   [name], MD Not Taking Active   EASY COMFORT PEN NEEDLES 31G X 5 MM [place] [id number] Yes Used to check  Blood glucose 3 times daily or prn. Marletta Lor, MD Taking Active   Fluocinolone Acetonide Body 0.01 % OIL [id number] No Apply 1 application topically as directed.  Patient not taking: Reported on 09/02/2021   [provider] Not Taking Active   furosemide (LASIX) 20 MG tablet [id number] Yes Take 1 tablet (20 mg total) by mouth daily as needed for edema. [name], [name], MD Taking Active   glucose blood [facility] VERIO) test strip [id number] Yes Use as instructed [name], [name], MD Taking Active   hydrochlorothiazide (MICROZIDE) 12.5 MG capsule [id number] Yes TAKE 1 CAPSULE BY MOUTH EVERY DAY [name], MD Taking Active   insulin aspart (NOVOLOG FLEXPEN) 100 UNIT/ML FlexPen [id number] Yes INJECT 20-22 UNITS INTO SKIN PRIOR TO BREAKFAST AND 20-22 UNITS PRIOR TO EVENING MEAL [name], MD Taking Active   insulin degludec (TRESIBA FLEXTOUCH) 100 UNIT/ML FlexTouch Pen [id number] Yes INJECT 43 UNITS SUBCUTANEOUSLY ONCE DAILY [name], MD Taking Active   metFORMIN (GLUCOPHAGE) 1000 MG tablet [id number] Yes TAKE ONE TABLET BY MOUTH TWICE DAILY. TAKE WITH A MEAL. [name], [name], MD Taking Active   metoprolol tartrate (LOPRESSOR) 100 MG tablet [id number] Yes TAKE 1 TABLET BY MOUTH TWICE A  DAY [name], [name], MD Taking Active   nystatin-triamcinolone ointment [facility]) [id number] No Daily as needed on affected area.  Patient not taking: Reported on 09/02/2021   [name], [name], MD Not Taking Active   [facility] LANCETS 64B  [place] [id number] Yes Use to check blood sugar twice daily. DX Code E11.9 [name], MD Taking Active   ramipril (ALTACE) 10 MG capsule [id number] Yes Take 1 capsule (10 mg total) by mouth daily. [name], [name], MD Taking Active   sildenafil (VIAGRA) 50 MG tablet [id number] No Take 1 tablet (50 mg total) by mouth daily as needed for erectile dysfunction.  Patient not taking: Reported on 09/02/2021   [name], [name], MD Not Taking Active   spironolactone (ALDACTONE) 25 MG tablet [id number] Yes Take 1 tablet (25 mg total) by mouth daily. For blood pressure. [name], [name], MD Taking Active             Patient Active Problem List   Diagnosis Date Noted   Changing skin lesion 06/15/2021   Dermatologic problem 06/15/2021   (HFpEF) heart failure with preserved ejection fraction ([place]) 12/16/2020   Microalbuminuria due to type 2 diabetes mellitus ([place]) 08/05/2020   Hypertension associated with diabetes ([place]) 08/26/2015   PNEUMONIA, LEFT LOWER LOBE 09/08/2009   GOUT 05/20/2008   Type 2 diabetes mellitus with other specified complication ([place]) 04/16/1218   Pure hypercholesterolemia 11/23/2006   Resistant hypertension 11/23/2006   MYOCARDIAL INFARCTION, HX OF 11/23/2006   Coronary atherosclerosis 11/23/2006    Immunization History  Administered Date(s) Administered   Fluad Quad(high Dose 65+) 05/15/2019, 04/08/2020, 05/20/2021   Influenza Split 04/13/2011, 05/31/2012   Influenza Whole 05/21/2007, 06/22/2009, 06/15/2010   Influenza, High Dose Seasonal PF 05/02/2013, 03/31/2016, 06/18/2018   Influenza,inj,Quad PF,6+ Mos 04/02/2014, 03/30/2015, 06/08/2017   PFIZER(Purple Top)SARS-COV-2 Vaccination 09/12/2019, 10/04/2019, 06/20/2020   Pneumococcal  Conjugate-13 08/26/2015   Pneumococcal Polysaccharide-23 05/02/2013   Tetanus 08/02/2013   Zoster Recombinat (Shingrix) 06/20/2020, 09/08/2020   Patient brought Freestyle Libre CGM supplies and presents today for education on use and initial placement.  I have reviewed proper use, including but not limited to: glucose direction and how to incorporate it in treatment decisions including meal insulin and exercise, evaluating previous evening trends first thing in the morning. Sensor lag was explained.   Procedure and information discussed with patient. Patient has no further questions at this time. Verbalizes understanding and agrees to have CGM placed.  Posterior aspect of right arm cleaned and prepared as instructed by manufactures instructions.    Freestyle Libre CGM placed to posterior aspect of right arm. No redness, swelling, bleeding, or bruising noted.   cleaned and prepared as instructed by manufacturers instructions. Freestyle Libre CGM placed as instructed by manufacturers instructions. No redness, swelling, bleeding, or bruising noted. Sensor activated and monitoring at this time. Patient is aware to resume normal activities including bathing, getting dressed, and exercise.   Follow-up in 14 days with pharmacist or as instructed by provider.   Conditions to be addressed/monitored:  Hypertension, Hyperlipidemia, Diabetes and Gout  Conditions addressed this visit: Hypertension, diabetes  Care Plan : CCM Pharmacy Care Plan  Updates made by [name], [place] since 09/20/2021 12:00 AM     Problem: Problem: Hypertension, Hyperlipidemia, Diabetes and Gout      Long-Range Goal: Patient-Specific Goal   Start Date: 11/09/2020  Expected End Date: 11/09/2021  Recent Progress: On track  Priority: High  Note:   Current Barriers:  Unable to independently monitor therapeutic efficacy Unable to achieve control of diabetes   Pharmacist Clinical Goal(s):  Patient will verbalize  ability to afford treatment regimen achieve adherence to monitoring guidelines and medication adherence to achieve therapeutic efficacy achieve control of diabetes as evidenced by A1c  through collaboration with PharmD and provider.   Interventions: 1:1 collaboration with [name], [name], MD regarding development and update of comprehensive plan of care as evidenced by provider attestation and co-signature Inter-disciplinary care team collaboration (see longitudinal plan of care) Comprehensive medication review performed; medication list updated in electronic medical record  Hypertension (BP goal <140/90) -Not ideally controlled -Current treatment: amlodipine 75m, 1 tablet once daily - Appropriate, Query effective, Safe, Accessible metoprolol tartrate 1057m 1 tablet twice daily - Appropriate, Query effective, Safe, Accessible ramipril 1010m1 capsule once daily  - Appropriate, Query effective, Safe, Accessible Hydrochlorothiazide 12.5 mg 1 capsule daily - Appropriate, Query effective, Safe, Accessible furosemide 42m66m tablet once daily as needed - Appropriate, Query effective, Safe, Accessible sprironolactone  25mg110mtablet once daily - Appropriate, Query effective, Safe, Accessible -Medications previously tried: n/a -Current home readings: could not provide as patient does not check consistently -Current dietary habits: did not discuss -Current exercise habits: did not discuss -Denies hypotensive/hypertensive symptoms -Educated on Daily salt intake goal < 2300 mg; Exercise goal of 150 minutes per week; Importance of home blood pressure monitoring; Proper BP monitoring technique; Symptoms of hypotension and importance of maintaining adequate hydration; -Counseled to monitor BP at home weekly, document, and provide log at future appointments -Counseled on diet and exercise extensively Recommended to continue current medication  Hyperlipidemia/history of MI: (LDL goal <  55) -Controlled -Current treatment: atorvastatin 40mg,70mablet once daily - Appropriate, Effective, Safe, Accessible aspirin 81mg, 25mblet once daily - Appropriate, Effective, Safe, Accessible -Medications previously tried: none  -Current dietary patterns: did not discuss -Current exercise habits: did not discuss -Educated on Cholesterol goals;  Importance of limiting foods high in cholesterol; Exercise goal of 150 minutes per week; -Counseled on diet and exercise extensively Recommended to continue current medication  Diabetes (A1c goal <7%) -Uncontrolled -Current medications: insulin aspart (Novolog Flexpen), inject 10-12 units at breakfast if needed and 12 units at suppertime - Appropriate, Query effective, Safe, Accessible insulin degludec (TresibTyler Aasuch), inject 40 units at suppertime (changing times) - Appropriate, Query effective, Safe, Accessible metformin 1000mg, 192mlet twice daily - Appropriate, Query effective, Safe, Accessible -Medications previously tried: n/a  -Current home glucose readings fasting glucose: ranging from 90s-150s post prandial glucose: n/a -Denies hypoglycemic/hyperglycemic symptoms -Current meal patterns:  breakfast: did not discuss lunch: did not discuss  dinner: did not discuss snacks: did not discuss drinks: cut back on sodas drinking 2 per week, drinking more water and drinking some gatorade  -Current exercise: patient reports he is walking almost every day and has been active with driving his sister around -Educated on A1c and blood sugar goals; Exercise goal of 150 minutes per week; Benefits of routine self-monitoring of blood sugar; Continuous glucose monitoring; -Counseled to check feet daily and get yearly eye exams -Counseled on diet and exercise extensively Recommended to continue current medication  Heart Failure (Goal: manage symptoms and prevent exacerbations) -Not ideally controlled -Last ejection fraction: 40-45% (Date:  08/04/21) -HF type: Systolic -NYHA Class: II (slight limitation of activity) -AHA HF Stage: C (Heart disease and symptoms present) -Current treatment: Spironolactone 25 mg 1 tablet daily - Appropriate, Effective, Safe, Accessible Furosemide 20 mg 1 tablet as needed - Appropriate, Effective, Safe, Accessible Ramipril 10 mg 1 capsule daily - Appropriate, Effective, Safe, Accessible -Medications previously tried: unknown  -Current home BP/HR readings: could not provide -Current dietary habits: did not discuss -Current exercise habits: did not discuss -Educated on Benefits of medications for managing symptoms and  prolonging life Importance of blood pressure control -Recommended to continue current medication   Gout (Goal: uric acid < 6 and prevent flare ups) -Controlled -Current treatment  allopurinol 347m, 1 tablet once daily  - Appropriate, Effective, Safe, Accessible colchicine 0.669m 1 tablet once daily as needed - Appropriate, Effective, Safe, Accessible -Medications previously tried: none  -Recommended to continue current medication  Health Maintenance -Vaccine gaps: none -Current therapy:  No medications -Educated on Cost vs benefit of each product must be carefully weighed by individual consumer -Patient is satisfied with current therapy and denies issues -Recommended to continue as is  Patient Goals/Self-Care Activities Patient will:  - take medications as prescribed focus on medication adherence by setting a timer for evening dose of metoprolol check glucose a few times daily, document, and provide at future appointments check blood pressure weekly, document, and provide at future appointments target a minimum of 150 minutes of moderate intensity exercise weekly  Follow Up Plan: The care management team will reach out to the patient again over the next 30 days.       Medication Assistance: None required.  Patient affirms current coverage meets  needs.  Compliance/Adherence/Medication fill history: Care Gaps: Eye exam, colonoscopy, COVID booster Last BP - 138/62 on 09/02/2021 Last A1C - 8.0 on 08/31/2021  Star-Rating Drugs: Atorvastatin 4063m last filled on [facility]/10/2021 [facility] with [facility] Metformin 1000m26mlast filled on [facility]/10/2021 [facility] with [facility] Ramipril 10mg14mast filled on 06/24/2021 [facility] at [facility]  Patient's preferred pharmacy is:  [facility] [place] [address]e: [phone]/pharmacy #[id number] [address] [place] [address] [address]: [phone], [place] - [address] [place] [address]: [phone] [address]) - [place] [address] [place]: [address] [place] [place] [address] 4th FlM[place]-[address]: [phone] pill box? Yes Pt endorses 80% compliance  We discussed: Benefits of medication synchronization, packaging and delivery as well as enhanced pharmacist oversight with [employer]. Patient decided to: Continue current medication management strategy  Care Plan and Follow Up Patient Decision:  Patient agrees to Care Plan and Follow-up.  Plan: The care management team will reach out to the patient again over the next 30 days.  MadeliJeni SallesmD BCACP [facility] Pharmacist [place]

## 2021-09-30 ENCOUNTER — Telehealth: Payer: Self-pay

## 2021-09-30 NOTE — Telephone Encounter (Signed)
Patient came into the office on 09/29/21 asking for help with his omnipod. I spoke with nurse and she advised me that patient would have to get an appt with Vaughan Basta. I gave patient information to get an an appt with Vaughan Basta. Patient called back into the office stating that when calling to schedule with Vaughan Basta he was told they couldn't help him and that he needed to call our office.  ? ?Please advise what the patient needs to do to get the help that he needing  ? ? ?[phone] ?

## 2021-10-01 NOTE — Telephone Encounter (Signed)
Attempted to call patient, no answer and not able to leave vm. I did however let front desk know that patient would need to reach out to Omnipod techical support for help. Mickel Baas are you able to assist patient? ?

## 2021-10-01 NOTE — Telephone Encounter (Signed)
Called patient. ?He is unable to start the [place].  Appointment for training set up with myself for 10/05/21 at 8:30. ? ?.lau ?

## 2021-10-04 ENCOUNTER — Other Ambulatory Visit: Payer: Self-pay | Admitting: Endocrinology

## 2021-10-04 DIAGNOSIS — E1165 Type 2 diabetes mellitus with hyperglycemia: Secondary | ICD-10-CM

## 2021-10-05 ENCOUNTER — Other Ambulatory Visit: Payer: Self-pay

## 2021-10-05 ENCOUNTER — Encounter: Payer: HMO | Attending: Endocrinology | Admitting: Dietician

## 2021-10-05 DIAGNOSIS — Z794 Long term (current) use of insulin: Secondary | ICD-10-CM | POA: Insufficient documentation

## 2021-10-05 DIAGNOSIS — E1169 Type 2 diabetes mellitus with other specified complication: Secondary | ICD-10-CM | POA: Diagnosis not present

## 2021-10-05 NOTE — Progress Notes (Signed)
Patient was here today alone. ?He has a [employer] 2 and could not remember how to put a new one on.  Coached patient through putting on a new sensor and patient was able to demonstrate and placed on right arm. ?Reviewed that he should not take high doses of vitamin C. ?Reviewed meaning of the arrows next to the blood glucose and how to respond to them. ?Reviewed that he should scan his sensor at least every 8 hours. ?It is normal for the glucose to rise 40-60 points after a meal. ?He states that he gets a low blood sugar at times.  Discussed to do a fingerstick if his sensor reading does not match his symptoms and use the meter reading as the most accurate. ?Reviewed treatment of hypoglycemia. ?He states that he has had a low at times when he takes his meal time insulin after his meal.  Discussed the importance of timing of his insulin and that the meal time insulin should be taken before his meal. ?Patient with no further questions and is to follow up with his primary MD and endo in April and May.   ?Patient to call for questions. ? ?Antonieta Iba, RD, LDN, CDCES ? ?

## 2021-10-08 DIAGNOSIS — Z794 Long term (current) use of insulin: Secondary | ICD-10-CM

## 2021-10-08 DIAGNOSIS — E1169 Type 2 diabetes mellitus with other specified complication: Secondary | ICD-10-CM

## 2021-10-08 DIAGNOSIS — I10 Essential (primary) hypertension: Secondary | ICD-10-CM | POA: Diagnosis not present

## 2021-10-08 DIAGNOSIS — E1159 Type 2 diabetes mellitus with other circulatory complications: Secondary | ICD-10-CM

## 2021-10-12 ENCOUNTER — Other Ambulatory Visit: Payer: Self-pay | Admitting: Family Medicine

## 2021-10-12 DIAGNOSIS — M1A9XX0 Chronic gout, unspecified, without tophus (tophi): Secondary | ICD-10-CM

## 2021-10-18 ENCOUNTER — Ambulatory Visit (INDEPENDENT_AMBULATORY_CARE_PROVIDER_SITE_OTHER): Payer: HMO | Admitting: Family Medicine

## 2021-10-18 ENCOUNTER — Encounter: Payer: Self-pay | Admitting: Family Medicine

## 2021-10-18 VITALS — BP 118/76 | HR 67 | Resp 16 | Ht 72.0 in | Wt 218.5 lb

## 2021-10-18 DIAGNOSIS — I5032 Chronic diastolic (congestive) heart failure: Secondary | ICD-10-CM | POA: Diagnosis not present

## 2021-10-18 DIAGNOSIS — Z794 Long term (current) use of insulin: Secondary | ICD-10-CM

## 2021-10-18 DIAGNOSIS — I10 Essential (primary) hypertension: Secondary | ICD-10-CM | POA: Diagnosis not present

## 2021-10-18 DIAGNOSIS — E1159 Type 2 diabetes mellitus with other circulatory complications: Secondary | ICD-10-CM

## 2021-10-18 DIAGNOSIS — E1169 Type 2 diabetes mellitus with other specified complication: Secondary | ICD-10-CM

## 2021-10-18 MED ORDER — FREESTYLE LIBRE 2 SENSOR MISC: 2.0000 | 3 refills | Status: DC

## 2021-10-18 NOTE — Progress Notes (Signed)
? ?HPI: ?[name] is a 76 y.o. male, who is here today to follow on recent visit. ?He was last seen on 05/26/21. ? ?Since his last visit he has seen his cardiologist and has had echo and stress test done. ?Echo 08/04/21: LVEF 40-45%, left ventricle global hypokinesis, ventricular diastolic function indeterminate. ?Denies orthopnea and PND. ? ?Hypertension:  ?Medications:Ramipril 10 mg daily, HCTZ 12.5 mg,spironolactone 25 mg daily, Metoprolol Tartrate 100 mg bid, and Amlodipine 10 mg daily. ?BP readings at home:120's/70's. ?Side effects:None. ?Negative for unusual or severe headache, visual changes, exertional chest pain, dyspnea,  focal weakness, or edema. ? ?Lab Results  ?Component Value Date  ? CREATININE 0.94 08/31/2021  ? BUN 13 08/31/2021  ? NA 137 08/31/2021  ? K 4.4 08/31/2021  ? CL 100 08/31/2021  ? CO2 30 08/31/2021  ? ?He is concerned about freestyle going off in the middle of the night mainly, BS's 50's. ?Also requesting refills on Freestyle libre sensors. ? ?He saw Dr Dwyane Dee on 09/02/21. ?He is taking [name] 38 U bid and Novalog 20-22 U bid. ?Also on Metformin 1000 mg bid with meals. ?Walking 1 hour 2-3 times per week. ?He acknowledge he eats a lot of sweets. ? ?Review of Systems  ?Constitutional:  Negative for activity change, appetite change and fever.  ?HENT:  Negative for nosebleeds and sore throat.   ?Respiratory:  Negative for cough and wheezing.   ?Gastrointestinal:  Negative for abdominal pain, nausea and vomiting.  ?Genitourinary:  Negative for decreased urine volume and hematuria.  ?Neurological:  Negative for dizziness, syncope and facial asymmetry.  ?Rest see pertinent positives and negatives per HPI. ? ?Current Outpatient Medications on File Prior to Visit  ?Medication Sig Dispense Refill  ? Alcohol Swabs (B-D SINGLE USE SWABS REGULAR) PADS Use as necessary 300 each 6  ? allopurinol (ZYLOPRIM) 300 MG tablet TAKE 1 TABLET BY MOUTH EVERY DAY 90 tablet 2  ? amLODipine (NORVASC) 10 MG  tablet TAKE 1 TABLET BY MOUTH EVERY DAY 90 tablet 2  ? aspirin 81 MG tablet Take 81 mg by mouth daily.    ? atorvastatin (LIPITOR) 40 MG tablet TAKE 1 TABLET BY MOUTH EVERY DAY 90 tablet 3  ? bacitracin 500 UNIT/GM ointment Apply 1 application topically 2 (two) times daily. 15 g 0  ? Blood Glucose Monitoring Suppl (ONETOUCH VERIO IQ SYSTEM) w/Device KIT USE TO CHECK BLOOD SUGAR TWICE A DAY DX CODE E11.9 1 kit 2  ? clobetasol ointment (TEMOVATE) 8.29 % Apply 1 application topically 2 (two) times daily. 30 g 0  ? colchicine 0.6 MG tablet TAKE 1 TABLET (0.6 MG TOTAL) BY MOUTH DAILY AS NEEDED (FOR GOUT ATTACK.). 30 tablet 1  ? EASY COMFORT PEN NEEDLES 31G X 5 MM MISC Used to check  Blood glucose 3 times daily or prn. 100 each 3  ? Fluocinolone Acetonide Body 0.01 % OIL Apply 1 application. topically as directed.    ? furosemide (LASIX) 20 MG tablet Take 1 tablet (20 mg total) by mouth daily as needed for edema. 90 tablet 2  ? glucose blood (ONETOUCH VERIO) test strip Use as instructed 200 strip 2  ? hydrochlorothiazide (MICROZIDE) 12.5 MG capsule TAKE 1 CAPSULE BY MOUTH EVERY DAY 30 capsule 2  ? insulin aspart (NOVOLOG FLEXPEN) 100 UNIT/ML FlexPen INJECT 20-22 UNITS INTO SKIN PRIOR TO BREAKFAST AND 20-22 UNITS PRIOR TO EVENING MEAL 15 mL 3  ? insulin degludec (TRESIBA FLEXTOUCH) 100 UNIT/ML FlexTouch Pen INJECT 38 UNITS SUBCUTANEOUSLY ONCE DAILY  15 mL 1  ? metFORMIN (GLUCOPHAGE) 1000 MG tablet TAKE ONE TABLET BY MOUTH TWICE DAILY. TAKE WITH A MEAL. 180 tablet 1  ? metoprolol tartrate (LOPRESSOR) 100 MG tablet TAKE 1 TABLET BY MOUTH TWICE A DAY 180 tablet 2  ? OneTouch Delica Lancets 88Q MISC Use to check blood sugar twice daily. DX Code E11.9 200 each 1  ? ramipril (ALTACE) 10 MG capsule Take 1 capsule (10 mg total) by mouth daily. 90 capsule 2  ? sildenafil (VIAGRA) 50 MG tablet Take 1 tablet (50 mg total) by mouth daily as needed for erectile dysfunction. 90 tablet 0  ? spironolactone (ALDACTONE) 25 MG tablet Take 1  tablet (25 mg total) by mouth daily. For blood pressure. 90 tablet 2  ? ?No current facility-administered medications on file prior to visit.  ? ? ?Past Medical History:  ?Diagnosis Date  ? Cataract   ? had surgery to remove them  ? CORONARY ARTERY DISEASE 11/23/2006  ? DIABETES MELLITUS, TYPE II 11/23/2006  ? GOUT 05/20/2008  ? HYPERLIPIDEMIA 11/23/2006  ? HYPERTENSION 11/23/2006  ? MYOCARDIAL INFARCTION, HX OF 11/22/2001  ? PNEUMONIA, LEFT LOWER LOBE 09/08/2009  ? Rash   ? ?No Known Allergies ? ?Social History  ? ?Socioeconomic History  ? Marital status: Widowed  ?  Spouse name: Not on file  ? Number of children: 2  ? Years of education: Not on file  ? Highest education level: Not on file  ?Occupational History  ?  Comment: Retired from [employer]  ?Tobacco Use  ? Smoking status: Never  ? Smokeless tobacco: Never  ?Vaping Use  ? Vaping Use: Never used  ?Substance and Sexual Activity  ? Alcohol use: No  ?  Comment: stopped drinking etoh completely in 27-Aug-1999  ? Drug use: No  ? Sexual activity: Yes  ?  Comment: uses viagra  ?Other Topics Concern  ? Not on file  ?Social History Narrative  ? Lives alone  ? Retired from [employer] in Aug 27, 1999  ? Twin sons, one son died of a stroke this year 26-Aug-2018) at age 31, his living son resides in [place]   ? His wife died of breast cancer 11 years ago   ? ?Social Determinants of Health  ? ?Financial Resource Strain: Low Risk   ? Difficulty of Paying Living Expenses: Not hard at all  ?Food Insecurity: No Food Insecurity  ? Worried About Charity fundraiser in the Last Year: Never true  ? Ran Out of Food in the Last Year: Never true  ?Transportation Needs: No Transportation Needs  ? Lack of Transportation (Medical): No  ? Lack of Transportation (Non-Medical): No  ?Physical Activity: Sufficiently Active  ? Days of Exercise per Week: 5 days  ? Minutes of Exercise per Session: 50 min  ?Stress: No Stress Concern Present  ? Feeling of Stress : Not at all  ?Social Connections: Moderately Integrated   ? Frequency of Communication with Friends and Family: Twice a week  ? Frequency of Social Gatherings with Friends and Family: More than three times a week  ? Attends Religious Services: More than 4 times per year  ? Active Member of Clubs or Organizations: Yes  ? Attends Archivist Meetings: 1 to 4 times per year  ? Marital Status: Widowed  ? ?Vitals:  ? 10/18/21 0946  ?BP: 118/76  ?Pulse: 67  ?Resp: 16  ?SpO2: 97%  ? ?Body mass index is 29.63 kg/m?. ? ?Physical Exam ?Nursing note reviewed.  ?Constitutional:   ?  General: He is not in acute distress. ?   Appearance: He is well-developed.  ?HENT:  ?   Head: Normocephalic and atraumatic.  ?   Mouth/Throat:  ?   Mouth: Mucous membranes are moist.  ?   Dentition: Has dentures.  ?   Pharynx: Oropharynx is clear.  ?Eyes:  ?   Conjunctiva/sclera: Conjunctivae normal.  ?Cardiovascular:  ?   Rate and Rhythm: Normal rate and regular rhythm.  ?   Pulses:     ?     Dorsalis pedis pulses are 2+ on the right side and 2+ on the left side.  ?   Heart sounds: Murmur (SEM I/VI RUSB) heard.  ?Pulmonary:  ?   Effort: Pulmonary effort is normal. No respiratory distress.  ?   Breath sounds: Normal breath sounds.  ?Abdominal:  ?   Palpations: Abdomen is soft. There is no hepatomegaly or mass.  ?   Tenderness: There is no abdominal tenderness.  ?Lymphadenopathy:  ?   Cervical: No cervical adenopathy.  ?Skin: ?   General: Skin is warm.  ?   Findings: No erythema or rash.  ?Neurological:  ?   Mental Status: He is alert and oriented to person, place, and time.  ?   Cranial Nerves: No cranial nerve deficit.  ?   Gait: Gait normal.  ?Psychiatric:  ?   Comments: Well groomed, good eye contact.  ? ?ASSESSMENT AND PLAN: ? ?Ms.Patrick Wilcox was seen today for follow-up. ? ?Diagnoses and all orders for this visit: ? ?Resistant hypertension ?BP adequately controlled. ?Continue current management: Ramipril 10 mg daily, HCTZ 12.5 mg,spironolactone 25 mg daily, Metoprolol Tartrate 100 mg bid, and  Amlodipine 10 mg daily. ?DASH/low salt diet to continue. ?Continue monitoring BP at home. ? ?Type 2 diabetes mellitus with other specified complication (Patrick Wilcox) ?Having episodes of hypoglycemia, he takingT

## 2021-10-18 NOTE — Assessment & Plan Note (Signed)
BP adequately controlled. Continue current management: Ramipril 10 mg daily, HCTZ 12.5 mg,spironolactone 25 mg daily, Metoprolol Tartrate 100 mg bid, and Amlodipine 10 mg daily. DASH/low salt diet to continue. Continue monitoring BP at home. 

## 2021-10-18 NOTE — Patient Instructions (Addendum)
A few things to remember from today's visit: ? ?No diagnosis found. ? ?If you need refills please call your pharmacy. ?Do not use My Chart to request refills or for acute issues that need immediate attention. ?  ?[name] is once daily, Morning or night. ?No changes today. ?Continue monitoring blood pressures. ?Avoid skipping meals. ?Decreased sweets intake. ?Continue walking a few times per week, try to do so 4 times. ? ?Please be sure medication list is accurate. ?If a new problem present, please set up appointment sooner than planned today. ? ? ? ? ? ? ? ?

## 2021-10-18 NOTE — Assessment & Plan Note (Signed)
Euvolemic. ?Continue Metoprolol tartrate,Spironolactone,Ramipril, and furosemide same dose. ?Low salt diet. ?Following with cardiologist. ?

## 2021-10-18 NOTE — Assessment & Plan Note (Addendum)
Having episodes of hypoglycemia, he taking[place] 38 U bid, instructed to decrease to once daily. ?Continue Novalog 20-22 U before meals bid and Metformin 1000 mg bid. ?Refills for [facility] sensor sent. ?Stressed the importance of following dietary recommendations, he recently met with nutritionist. ?Continue monitoring BS's. ?Following with Dr Dwyane Dee. ? ?

## 2021-10-19 ENCOUNTER — Other Ambulatory Visit: Payer: Self-pay | Admitting: Endocrinology

## 2021-10-19 DIAGNOSIS — E1165 Type 2 diabetes mellitus with hyperglycemia: Secondary | ICD-10-CM

## 2021-10-22 ENCOUNTER — Telehealth: Payer: Self-pay | Admitting: Pharmacist

## 2021-10-22 NOTE — Chronic Care Management (AMB) (Signed)
? ? ?  Chronic Care Management ?Pharmacy Assistant  ? ?Name: Patrick Wilcox  MRN: [id number] DOB: Jan 17, 1946 ? ?Reason for Encounter: Follow up use of blood pressure monitor and Freestyle Libre. ? ?Spoke with patient, recent blood sugar readings: ?10/18/21 - 183 ?10/19/21 - 116 ?10/20/21 - 94 ?10/21/21 - 88 ?Patient is not sure which readings are fasting, he states he doesn't keep up with all that.  ? ?Spoke with patient, recent blood pressure readings: ?140/68 ?Patient states he checks blood pressures about 2 times per week, he states he doesn't have any of the other readings.  ?  ?SCHEDULE FOLLOW UP VISIT  - Spoke with patient, he was quite frustrated with "all these phone calls from all of [place] people checking on him and making all these appointments." I kindly explained again who I was and why I was calling to help him understand.  He was still quite frustrated and stated he didn't have time to schedule today as he was going out the door to the barber! ?I have been unable to reach patient back after several attempts.  ? ?Gennie Alma CMA  ?Clinical Pharmacist Assistant ?[phone] ? ?

## 2021-10-25 ENCOUNTER — Ambulatory Visit (INDEPENDENT_AMBULATORY_CARE_PROVIDER_SITE_OTHER): Payer: HMO

## 2021-10-25 DIAGNOSIS — E1159 Type 2 diabetes mellitus with other circulatory complications: Secondary | ICD-10-CM

## 2021-10-25 DIAGNOSIS — I10 Essential (primary) hypertension: Secondary | ICD-10-CM

## 2021-10-25 DIAGNOSIS — E7800 Pure hypercholesterolemia, unspecified: Secondary | ICD-10-CM

## 2021-10-25 DIAGNOSIS — I2510 Atherosclerotic heart disease of native coronary artery without angina pectoris: Secondary | ICD-10-CM

## 2021-10-25 NOTE — Patient Instructions (Signed)
Visit Information ? ?Thank you for taking time to visit with me today. Please don't hesitate to contact me if I can be of assistance to you before our next scheduled telephone appointment. ? ?Following are the goals we discussed today:  ?Take all medications as prescribed ?Attend all scheduled provider appointments ?Call pharmacy for medication refills 3-7 days in advance of running out of medications ?Attend church or other social activities ?Call provider office for new concerns or questions  ?keep appointment with eye doctor ?check blood sugar at prescribed times: once daily and when you have symptoms of low or high blood sugar ?check feet daily for cuts, sores or redness ?enter blood sugar readings and medication or insulin into daily log ?trim toenails straight across ?drink 6 to 8 glasses of water each day ?fill half of plate with vegetables ?limit fast food meals to no more than 1 per week ?manage portion size ?switch to sugar-free drinks ?check blood pressure 3 times per week ?choose a place to take my blood pressure (home, clinic or office, retail store) ?take blood pressure log to all doctor appointments ?call doctor for signs and symptoms of high blood pressure ?eat more whole grains, fruits and vegetables, lean meats and healthy fats ?limit salt intake to '2300mg'$ /day ?call for medicine refill 2 or 3 days before it runs out ?take all medications exactly as prescribed ?call doctor with any symptoms you believe are related to your medicine ? ?Our next appointment is by telephone on 01/25/22 at 10:45 AM ? ?Please call the care guide team at [phone] if you need to cancel or reschedule your appointment.  ? ?If you are experiencing a Mental Health or [place] or need someone to talk to, please call the Suicide and Crisis Lifeline: 988 ?call the [place] National Suicide Prevention Lifeline: 904-193-3550 or TTY: 863 677 6852 TTY 628-418-3336) to talk to a trained counselor ?call 1-800-273-TALK  (toll free, 24 hour hotline) ?go to [facility] [address] [place] [phone]) ?call 911  ? ?The patient verbalized understanding of instructions, educational materials, and care plan provided today and agreed to receive a mailed copy of patient instructions, educational materials, and care plan.  ? ?Peter Garter RN, BSN,CCM, CDE ?Care Management Coordinator ?[facility] ?[phone]   ?

## 2021-10-25 NOTE — Chronic Care Management (AMB) (Signed)
?Chronic Care Management  ? ?CCM RN Visit Note ? ?10/25/2021 ?Name: Patrick Wilcox MRN: [id number] DOB: 09/21/1945 ? ?Subjective: ?Patrick Wilcox is a 76 y.o. year old male who is a primary care patient of [name], [name], MD. The care management team was consulted for assistance with disease management and care coordination needs.   ? ?Engaged with patient by telephone for follow up visit in response to provider referral for case management and/or care coordination services.  ? ?Consent to Services:  ?The patient was given information about Chronic Care Management services, agreed to services, and gave verbal consent prior to initiation of services.  Please see initial visit note for detailed documentation.  ? ?Patient agreed to services and verbal consent obtained.  ? ?Assessment: Review of patient past medical history, allergies, medications, health status, including review of consultants reports, laboratory and other test data, was performed as part of comprehensive evaluation and provision of chronic care management services.  ? ?SDOH (Social Determinants of Health) assessments and interventions performed:   ? ?CCM Care Plan ? ?No Known Allergies ? ?Outpatient Encounter Medications as of 10/25/2021  ?Medication Sig  ? Alcohol Swabs (B-D SINGLE USE SWABS REGULAR) PADS Use as necessary  ? allopurinol (ZYLOPRIM) 300 MG tablet TAKE 1 TABLET BY MOUTH EVERY DAY  ? amLODipine (NORVASC) 10 MG tablet TAKE 1 TABLET BY MOUTH EVERY DAY  ? aspirin 81 MG tablet Take 81 mg by mouth daily.  ? atorvastatin (LIPITOR) 40 MG tablet TAKE 1 TABLET BY MOUTH EVERY DAY  ? bacitracin 500 UNIT/GM ointment Apply 1 application topically 2 (two) times daily.  ? Blood Glucose Monitoring Suppl (ONETOUCH VERIO IQ SYSTEM) w/Device KIT USE TO CHECK BLOOD SUGAR TWICE A DAY DX CODE E11.9  ? clobetasol ointment (TEMOVATE) 0.05 % Apply 1 application topically 2 (two) times daily.  ? colchicine 0.6 MG tablet TAKE 1 TABLET (0.6 MG TOTAL) BY MOUTH DAILY  AS NEEDED (FOR GOUT ATTACK.).  ? Continuous Blood Gluc Sensor (FREESTYLE LIBRE 2 SENSOR) MISC 2 Devices by Does not apply route every 14 (fourteen) days.  ? EASY COMFORT PEN NEEDLES 31G X 5 MM MISC Used to check  Blood glucose 3 times daily or prn.  ? Fluocinolone Acetonide Body 0.01 % OIL Apply 1 application. topically as directed.  ? furosemide (LASIX) 20 MG tablet Take 1 tablet (20 mg total) by mouth daily as needed for edema.  ? glucose blood (ONETOUCH VERIO) test strip Use as instructed  ? hydrochlorothiazide (MICROZIDE) 12.5 MG capsule TAKE 1 CAPSULE BY MOUTH EVERY DAY  ? insulin aspart (NOVOLOG FLEXPEN) 100 UNIT/ML FlexPen INJECT 20-22 UNITS INTO SKIN PRIOR TO BREAKFAST AND 20-22 UNITS PRIOR TO EVENING MEAL  ? insulin degludec (TRESIBA FLEXTOUCH) 100 UNIT/ML FlexTouch Pen INJECT 38 UNITS SUBCUTANEOUSLY ONCE DAILY  ? metFORMIN (GLUCOPHAGE) 1000 MG tablet TAKE ONE TABLET BY MOUTH TWICE DAILY. TAKE WITH A MEAL.  ? metoprolol tartrate (LOPRESSOR) 100 MG tablet TAKE 1 TABLET BY MOUTH TWICE A DAY  ? OneTouch Delica Lancets 33G MISC Use to check blood sugar twice daily. DX Code E11.9  ? ramipril (ALTACE) 10 MG capsule Take 1 capsule (10 mg total) by mouth daily.  ? sildenafil (VIAGRA) 50 MG tablet Take 1 tablet (50 mg total) by mouth daily as needed for erectile dysfunction.  ? spironolactone (ALDACTONE) 25 MG tablet Take 1 tablet (25 mg total) by mouth daily. For blood pressure.  ? ?No facility-administered encounter medications on file as of 10/25/2021.  ? ? ?  Patient Active Problem List  ? Diagnosis Date Noted  ? Changing skin lesion 06/15/2021  ? Dermatologic problem 06/15/2021  ? (HFpEF) heart failure with preserved ejection fraction ([place]) 12/16/2020  ? Microalbuminuria due to type 2 diabetes mellitus ([place]) 08/05/2020  ? Hypertension associated with diabetes ([place]) 08/26/2015  ? PNEUMONIA, LEFT LOWER LOBE 09/08/2009  ? GOUT 05/20/2008  ? Type 2 diabetes mellitus with other specified complication ([place]) 11/57/2620   ? Pure hypercholesterolemia 11/23/2006  ? Resistant hypertension 11/23/2006  ? MYOCARDIAL INFARCTION, HX OF 11/23/2006  ? Coronary atherosclerosis 11/23/2006  ? ? ?Conditions to be addressed/monitored:CAD, HTN, HLD, and DMII ? ?Care Plan : RN Care Manager Plan of Care  ?Updates made by Dimitri Ped, RN since 10/25/2021 12:00 AM  ?  ? ?Problem: Chronic Disease Management and Care Coordination Needs (DM, CAD, HTN,HLD)   ?Priority: High  ?  ? ?Long-Range Goal: Establish Plan of Care for Chronic Disease Management Needs (DM, CAD, HTN,HLD)   ?Start Date: 07/19/2021  ?Expected End Date: 07/19/2022  ?Priority: High  ?Note:   ?Current Barriers:  ?Knowledge Deficits related to plan of care for management of CAD, HTN, HLD, DMII, and Gout  ?Chronic Disease Management support and education needs related to CAD, HTN, HLD, DMII, and Gout  ?States he has been feeling good.  States he is out and can not give specific CBG numbers but he does write them down.  States he has not been having low readings recently. States he sometimes wears the [place] but it falls off sometimes.  States he does do finger sticks if he is not wearing the [place] he is taking his [name] daily and his Novolog twice a day   States he is walking most days and he will go to big box stores or the mall to walk  Denies any chest pains, shortness of breath or swelling.  States he only checks his B/P 1-2 times a month.  States he tries to eat healthy but he does like sweets sometimes.  States he is getting too many call and would like fewer calls from [facility] as he gets calls from his insurance company and the pharmacist. ? ?RNCM Clinical Goal(s):  ?Patient will verbalize understanding of plan for management of CAD, HTN, HLD, DMII, and Gout as evidenced by voiced adherence to plan of care ?verbalize basic understanding of  CAD, HTN, HLD, DMII, and Gout disease process and self health management plan as evidenced by voiced understanding and teach back ?take all  medications exactly as prescribed and will call provider for medication related questions as evidenced by dispense report and pt verbalization ?attend all scheduled medical appointments: Endocrinology 12/05/21, Annual Wellness visit 01/25/22 as evidenced by medical records ?demonstrate Improved adherence to prescribed treatment plan for CAD, HTN, HLD, DMII, and Gout as evidenced by readings within limits, voiced adherence to plan of care ?continue to work with RN Care Manager to address care management and care coordination needs related to  CAD, HTN, HLD, DMII, and Gout as evidenced by adherence to CM Team Scheduled appointments through collaboration with RN Care manager, provider, and care team.  ? ?Interventions: ?1:1 collaboration with primary care provider regarding development and update of comprehensive plan of care as evidenced by provider attestation and co-signature ?Inter-disciplinary care team collaboration (see longitudinal plan of care) ?Evaluation of current treatment plan related to  self management and patient's adherence to plan as established by provider ?Reviewed frequency of RNCM calls and agrees to call in 3 months ? ? ?  CAD Interventions: (Status:  Goal on track:  Yes.) Long Term Goal ?Assessed understanding of CAD diagnosis ?Medications reviewed including medications utilized in CAD treatment plan ?Provided education on importance of blood pressure control in management of CAD ?Provided education on Importance of limiting foods high in cholesterol ?Counseled on importance of regular laboratory monitoring as prescribed ?Counseled on the importance of exercise goals with target of 150 minutes per week ?Reviewed Importance of taking all medications as prescribed ?Advised to report any changes in symptoms or exercise tolerance ? ? ?Diabetes Interventions:  (Status:  Goal on track:  NO.) Long Term Goal ?Assessed patient's understanding of A1c goal: <7% ?Provided education to patient about basic DM  disease process ?Reviewed medications with patient and discussed importance of medication adherence ?Counseled on importance of regular laboratory monitoring as prescribed ?Discussed plans with patient f

## 2021-11-05 ENCOUNTER — Other Ambulatory Visit: Payer: Self-pay | Admitting: Family Medicine

## 2021-11-05 DIAGNOSIS — M1A9XX0 Chronic gout, unspecified, without tophus (tophi): Secondary | ICD-10-CM

## 2021-11-07 DIAGNOSIS — E1159 Type 2 diabetes mellitus with other circulatory complications: Secondary | ICD-10-CM

## 2021-11-07 DIAGNOSIS — E7800 Pure hypercholesterolemia, unspecified: Secondary | ICD-10-CM | POA: Diagnosis not present

## 2021-11-07 DIAGNOSIS — I2510 Atherosclerotic heart disease of native coronary artery without angina pectoris: Secondary | ICD-10-CM | POA: Diagnosis not present

## 2021-11-07 DIAGNOSIS — I10 Essential (primary) hypertension: Secondary | ICD-10-CM

## 2021-11-16 ENCOUNTER — Other Ambulatory Visit: Payer: Self-pay | Admitting: Endocrinology

## 2021-11-16 DIAGNOSIS — E1169 Type 2 diabetes mellitus with other specified complication: Secondary | ICD-10-CM

## 2021-11-17 ENCOUNTER — Other Ambulatory Visit: Payer: Self-pay

## 2021-11-17 DIAGNOSIS — E1169 Type 2 diabetes mellitus with other specified complication: Secondary | ICD-10-CM

## 2021-11-17 MED ORDER — TRESIBA FLEXTOUCH 100 UNIT/ML ~~LOC~~ SOPN: PEN_INJECTOR | SUBCUTANEOUS | 2 refills | Status: DC

## 2021-11-25 ENCOUNTER — Other Ambulatory Visit (INDEPENDENT_AMBULATORY_CARE_PROVIDER_SITE_OTHER): Payer: HMO

## 2021-11-25 DIAGNOSIS — E1129 Type 2 diabetes mellitus with other diabetic kidney complication: Secondary | ICD-10-CM | POA: Diagnosis not present

## 2021-11-25 DIAGNOSIS — Z794 Long term (current) use of insulin: Secondary | ICD-10-CM | POA: Diagnosis not present

## 2021-11-25 DIAGNOSIS — E1169 Type 2 diabetes mellitus with other specified complication: Secondary | ICD-10-CM | POA: Diagnosis not present

## 2021-11-25 DIAGNOSIS — R809 Proteinuria, unspecified: Secondary | ICD-10-CM | POA: Diagnosis not present

## 2021-11-25 LAB — MICROALBUMIN / CREATININE URINE RATIO
Creatinine,U: 172.5 mg/dL
Microalb Creat Ratio: 39.9 mg/g — ABNORMAL HIGH (ref 0.0–30.0)
Microalb, Ur: 68.8 mg/dL — ABNORMAL HIGH (ref 0.0–1.9)

## 2021-11-25 LAB — BASIC METABOLIC PANEL
BUN: 14 mg/dL (ref 6–23)
CO2: 30 mEq/L (ref 19–32)
Calcium: 9.9 mg/dL (ref 8.4–10.5)
Chloride: 98 mEq/L (ref 96–112)
Creatinine, Ser: 0.97 mg/dL (ref 0.40–1.50)
GFR: 76.04 mL/min (ref >60.00)
Glucose, Bld: 220 mg/dL — ABNORMAL HIGH (ref 70–99)
Potassium: 4 mEq/L (ref 3.5–5.1)
Sodium: 136 mEq/L (ref 135–145)

## 2021-11-25 LAB — HEMOGLOBIN A1C: Hgb A1c MFr Bld: 8.7 % — ABNORMAL HIGH (ref 4.6–6.5)

## 2021-11-30 ENCOUNTER — Ambulatory Visit (INDEPENDENT_AMBULATORY_CARE_PROVIDER_SITE_OTHER): Payer: HMO | Admitting: Endocrinology

## 2021-11-30 ENCOUNTER — Encounter: Payer: Self-pay | Admitting: Endocrinology

## 2021-11-30 VITALS — BP 142/72 | HR 62 | Ht 72.0 in | Wt 221.0 lb

## 2021-11-30 DIAGNOSIS — E782 Mixed hyperlipidemia: Secondary | ICD-10-CM

## 2021-11-30 DIAGNOSIS — E1165 Type 2 diabetes mellitus with hyperglycemia: Secondary | ICD-10-CM | POA: Diagnosis not present

## 2021-11-30 DIAGNOSIS — I10 Essential (primary) hypertension: Secondary | ICD-10-CM | POA: Diagnosis not present

## 2021-11-30 DIAGNOSIS — Z794 Long term (current) use of insulin: Secondary | ICD-10-CM | POA: Diagnosis not present

## 2021-11-30 NOTE — Progress Notes (Signed)
Patient ID: Patrick Wilcox, male   DOB: April 11, 1946, 76 y.o.   MRN: [id number]    Reason for Appointment: Followup for Type 2 Diabetes   History of Present Illness:          Diagnosis: Type 2 diabetes mellitus, date of diagnosis: 2004       Past history:  At diagnosis he was having symptoms of drowsiness, difficulty breathing and blood sugar was reportedly over 600 He was initially treated with insulin but subsequently switched back to oral drugs He may have taken metformin and other medications for some time before going back on insulin His records indicate that he had been taking Amaryl and Januvia before going back on insulin in mid-2014 when his A1c had gone up over 13 Initially was given premixed insulin and then switched to Lantus and Humalog Record of his A1c indicates that his levels previously had been over 8% since 02/2012 He  had much better blood sugar control on his last visit with his changing his lifestyle and being instructed by the nurse educator  His Lantus did not appear to have 24 hour control with once a day injection  Recent history:   INSULIN regimen is described as:  38 units Tresiba daily in pm., Novolog 20 units after breakfast and dinner        Oral hypoglycemic drugs the patient is taking are: Metformin 1 g twice a day     His A1c is 8.7   Glucose monitoring, current management and problems identified: He was instructed on how to use the freestyle libre sensor but he forgot how to do it and still has not use it He did not call to let us know he needed further help  His A1c is higher He says that he has been traveling and not eating his normal meals  Also when eating out he is usually drinking regular soft drinks  He has gained another 3 pounds  Also he thinks he has difficulty getting a refill on his [name] but only for 2 or 3 days  He does not adjust his NovoLog based on his meal size or blood sugar Has not done any readings after  meals He has however tried to take his NovoLog more consistently before meals instead of after may still forget No hypoglycemia occurring at any time with lowest blood sugar 80 His lab glucose was 220 after breakfast and he likely had not taken his NovoLog that morning   DIET: Has seen the dietitian in 2014 and nurse educator in 8/18        Meals: 2 meals per day 9-10 am and 6 pm.   Breakfast is  eggs;  toast, eggs, has fruit for snacks.  Evening meal usually vegetables, tuna salad, chicken.       Compliance with the medical regimen:  Fair    Glucose monitoring: Checking less than 1 times a day        Glucometer:  One Touch Verio    Blood Glucose readings  from meter download   PRE-MEAL Fasting Lunch Dinner Bedtime Overall  Glucose range: 80-253      Mean/median:     152   POST-MEAL PC Breakfast PC Lunch PC Dinner  Glucose range: 228  ?  Mean/median:      Previously:  PRE-MEAL Fasting Midday Dinner Bedtime Overall  Glucose range: 96-199 94-264     Mean/median: 125    137   POST-MEAL PC Breakfast PC Lunch PC  Dinner  Glucose range:   ?  Mean/median:        Exercise: Walking recently regularly, 4-5/7 days a week  Weight history:  Wt Readings from Last 3 Encounters:  11/30/21 221 lb (100.2 kg)  10/18/21 218 lb 8 oz (99.1 kg)  09/02/21 218 lb 12.8 oz (99.2 kg)    Glycemic control:   Lab Results  Component Value Date   HGBA1C 8.7 (H) 11/25/2021   HGBA1C 8.0 (H) 08/31/2021   HGBA1C 7.5 (H) 05/17/2021   Lab Results  Component Value Date   MICROALBUR 68.8 (H) 11/25/2021   LDLCALC 48 11/10/2020   CREATININE 0.97 11/25/2021   Lab Results  Component Value Date   FRUCTOSAMINE 305 (H) 06/01/2020   FRUCTOSAMINE 296 (H) 12/31/2019   FRUCTOSAMINE 315 (H) 09/09/2019    Other active problems: See review of systems   Lab on 11/25/2021  Component Date Value Ref Range Status   Microalb, Ur 11/25/2021 68.8 (H)  0.0 - 1.9 mg/dL Final   Creatinine,U 11/25/2021  172.5  mg/dL Final   Microalb Creat Ratio 11/25/2021 39.9 (H)  0.0 - 30.0 mg/g Final   Sodium 11/25/2021 136  135 - 145 mEq/L Final   Potassium 11/25/2021 4.0  3.5 - 5.1 mEq/L Final   Chloride 11/25/2021 98  96 - 112 mEq/L Final   CO2 11/25/2021 30  19 - 32 mEq/L Final   Glucose, Bld 11/25/2021 220 (H)  70 - 99 mg/dL Final   BUN 11/25/2021 14  6 - 23 mg/dL Final   Creatinine, Ser 11/25/2021 0.97  0.40 - 1.50 mg/dL Final   GFR 11/25/2021 76.04  >60.00 mL/min Final   Calculated using the CKD-EPI Creatinine Equation (2021)   Calcium 11/25/2021 9.9  8.4 - 10.5 mg/dL Final   Hgb A1c MFr Bld 11/25/2021 8.7 (H)  4.6 - 6.5 % Final   Glycemic Control Guidelines for People with Diabetes:Non Diabetic:  <6%Goal of Therapy: <7%Additional Action Suggested:  >8%       Allergies as of 11/30/2021   No Known Allergies      Medication List        Accurate as of Nov 30, 2021  9:33 AM. If you have any questions, ask your nurse or doctor.          allopurinol 300 MG tablet Commonly known as: ZYLOPRIM TAKE 1 TABLET BY MOUTH EVERY DAY   amLODipine 10 MG tablet Commonly known as: NORVASC TAKE 1 TABLET BY MOUTH EVERY DAY   aspirin 81 MG tablet Take 81 mg by mouth daily.   atorvastatin 40 MG tablet Commonly known as: LIPITOR TAKE 1 TABLET BY MOUTH EVERY DAY   B-D SINGLE USE SWABS REGULAR Pads Use as necessary   bacitracin 500 UNIT/GM ointment Apply 1 application topically 2 (two) times daily.   clobetasol ointment 0.05 % Commonly known as: TEMOVATE Apply 1 application topically 2 (two) times daily.   colchicine 0.6 MG tablet TAKE 1 TABLET (0.6 MG TOTAL) BY MOUTH DAILY AS NEEDED (FOR GOUT ATTACK.).   Easy Comfort Pen Needles 31G X 5 MM Misc Generic drug: Insulin Pen Needle Used to check  Blood glucose 3 times daily or prn.   Fluocinolone Acetonide Body 0.01 % Oil Apply 1 application. topically as directed.   FreeStyle Libre 2 Sensor Misc 2 Devices by Does not apply route every  14 (fourteen) days.   furosemide 20 MG tablet Commonly known as: LASIX Take 1 tablet (20 mg total) by mouth daily as needed  for edema.   hydrochlorothiazide 12.5 MG capsule Commonly known as: MICROZIDE TAKE 1 CAPSULE BY MOUTH EVERY DAY   metFORMIN 1000 MG tablet Commonly known as: GLUCOPHAGE TAKE ONE TABLET BY MOUTH TWICE DAILY. TAKE WITH A MEAL.   metoprolol tartrate 100 MG tablet Commonly known as: LOPRESSOR TAKE 1 TABLET BY MOUTH TWICE A DAY   NovoLOG FlexPen 100 UNIT/ML FlexPen Generic drug: insulin aspart INJECT 20-22 UNITS INTO SKIN PRIOR TO BREAKFAST AND 20-22 UNITS PRIOR TO EVENING MEAL   OneTouch Delica Lancets 14N Misc Use to check blood sugar twice daily. DX Code E11.9   OneTouch Verio IQ System w/Device Kit USE TO CHECK BLOOD SUGAR TWICE A DAY DX CODE E11.9   OneTouch Verio test strip Generic drug: glucose blood Use as instructed   ramipril 10 MG capsule Commonly known as: ALTACE Take 1 capsule (10 mg total) by mouth daily.   sildenafil 50 MG tablet Commonly known as: VIAGRA Take 1 tablet (50 mg total) by mouth daily as needed for erectile dysfunction.   spironolactone 25 MG tablet Commonly known as: ALDACTONE Take 1 tablet (25 mg total) by mouth daily. For blood pressure.   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 He has never used smokeless tobacco. He reports that he does not drink alcohol and does not use drugs.    Review of Systems        Lipids:  LDL has been treated to goal with Lipitor 40 mg Has history of CAD, followed by PCP HDL has been low       Lab Results  Component Value Date   CHOL 102 11/10/2020   HDL 27.70 (L) 11/10/2020   LDLCALC 48 11/10/2020   TRIG 129.0 11/10/2020   CHOLHDL 4 11/10/2020       Is high blood pressure has been treated with Norvasc 10 mg, ramipril 10 mg, HCTZ 12.5, Aldactone 25 mg and metoprolol, Followed by PCP.   Bp 130 at home  He is on Lasix from PCP     BP Readings from Last 3 Encounters:  11/30/21 (!) 142/72  10/18/21 118/76  09/20/21 (!) 152/78   Lab Results  Component Value Date   K 4.0 82/95/6213   Complications: Microalbuminuria Microalbumin history:  Lab Results  Component Value Date   MICRALBCREAT 39.9 (H) 11/25/2021   MICRALBCREAT 39.9 (H) 02/11/2021   MICRALBCREAT 55.6 (H) 07/30/2020   MICRALBCREAT 98.1 (H) 09/09/2019       Diabetic foot exam was done in 11/22 by his PCP  Last exam with eye doctor  was 4/20 and has not made an  appointment despite reminders   LABS:  Lab on 11/25/2021  Component Date Value Ref Range Status   Microalb, Ur 11/25/2021 68.8 (H)  0.0 - 1.9 mg/dL Final   Creatinine,U 11/25/2021 172.5  mg/dL Final   Microalb Creat Ratio 11/25/2021 39.9 (H)  0.0 - 30.0 mg/g Final   Sodium 11/25/2021 136  135 - 145 mEq/L Final   Potassium 11/25/2021 4.0  3.5 - 5.1 mEq/L Final   Chloride 11/25/2021 98  96 - 112 mEq/L Final   CO2 11/25/2021 30  19 - 32 mEq/L Final   Glucose, Bld 11/25/2021 220 (H)  70 - 99 mg/dL Final   BUN 11/25/2021 14  6 - 23 mg/dL Final   Creatinine, Ser 11/25/2021 0.97  0.40 - 1.50 mg/dL Final   GFR 11/25/2021 76.04  >60.00 mL/min Final   Calculated  using the CKD-EPI Creatinine Equation (2021)   Calcium 11/25/2021 9.9  8.4 - 10.5 mg/dL Final   Hgb A1c MFr Bld 11/25/2021 8.7 (H)  4.6 - 6.5 % Final   Glycemic Control Guidelines for People with Diabetes:Non Diabetic:  <6%Goal of Therapy: <7%Additional Action Suggested:  >8%     Physical Examination:  BP (!) 142/72   Pulse 62   Ht 6' (1.829 m)   Wt 221 lb (100.2 kg)   SpO2 96%   BMI 29.97 kg/m     ASSESSMENT/PLAN  Diabetes type 2, On insulin   See history of present illness for detailed discussion of current management, problems identified and blood sugar patterns.  A1c is now 8.7, increased further  He has been on basal bolus insulin and Metformin  As above he has been off his diet and likely not taking insulin consistently at meals Has gained more weight even though he thinks he is walking regularly Currently with his fasting readings being quite variable and likely is not having consistent postprandial control the night before also   Plan: He needs to be consistent with his diet as discussed before Stop drinking regular soft drinks  He will be instructed by the nurse educator on how to use the freestyle libre sensor Reminded him that he must check his blood sugars 4 times a day with this Also need to make sure he takes his NovoLog consistently before meals He may be able to modify his diet better if he is aware of his postprandial readings and this was discussed No change in basal insulin  HYPERTENSION: Blood pressure is overall fairly well controlled Microalbumin unchanged  Needs follow-up lipids  There are no Patient Instructions on file for this visit.    Total visit time including counseling = 30 minutes   [name] 11/30/2021, 9:33 AM   Note: This office note was prepared with Dragon voice recognition system technology. Any transcriptional errors that result from this process are unintentional.

## 2021-11-30 NOTE — Patient Instructions (Signed)
With [place] check sugar 4x daily

## 2021-12-08 ENCOUNTER — Other Ambulatory Visit: Payer: Self-pay | Admitting: Family Medicine

## 2021-12-08 DIAGNOSIS — M1A9XX0 Chronic gout, unspecified, without tophus (tophi): Secondary | ICD-10-CM

## 2021-12-15 ENCOUNTER — Other Ambulatory Visit: Payer: Self-pay | Admitting: Endocrinology

## 2021-12-15 DIAGNOSIS — I10 Essential (primary) hypertension: Secondary | ICD-10-CM

## 2021-12-30 ENCOUNTER — Other Ambulatory Visit: Payer: Self-pay | Admitting: Endocrinology

## 2021-12-30 DIAGNOSIS — E1169 Type 2 diabetes mellitus with other specified complication: Secondary | ICD-10-CM

## 2022-01-12 ENCOUNTER — Other Ambulatory Visit: Payer: Self-pay | Admitting: Family Medicine

## 2022-01-12 DIAGNOSIS — M1A9XX0 Chronic gout, unspecified, without tophus (tophi): Secondary | ICD-10-CM

## 2022-01-25 ENCOUNTER — Ambulatory Visit (INDEPENDENT_AMBULATORY_CARE_PROVIDER_SITE_OTHER): Payer: PPO

## 2022-01-25 DIAGNOSIS — E1159 Type 2 diabetes mellitus with other circulatory complications: Secondary | ICD-10-CM

## 2022-01-25 DIAGNOSIS — I10 Essential (primary) hypertension: Secondary | ICD-10-CM

## 2022-01-25 NOTE — Patient Instructions (Signed)
Visit Information RNCM Case closed goals met Thank you for allowing me to share the care management and care coordination services that are available to you as part of your health plan and services through your primary care provider and medical home. Please reach out to me at [phone] if the care management/care coordination team may be of assistance to you in the future.   Nazifa Trinka RN, BSN,CCM, CDE Care Management Coordinator [facility] [phone]   

## 2022-01-25 NOTE — Chronic Care Management (AMB) (Signed)
Chronic Care Management   CCM RN Visit Note  01/25/2022 Name: Patrick Wilcox MRN: [id number] DOB: 1946/04/06  Subjective: Patrick Wilcox is a 76 y.o. year old male who is a primary care patient of [name], [name], MD. The care management team was consulted for assistance with disease management and care coordination needs.    Engaged with patient by telephone for follow up visit in response to provider referral for case management and/or care coordination services.   Consent to Services:  The patient was given information about Chronic Care Management services, agreed to services, and gave verbal consent prior to initiation of services.  Please see initial visit note for detailed documentation.   Patient agreed to services and verbal consent obtained.   Assessment: Review of patient past medical history, allergies, medications, health status, including review of consultants reports, laboratory and other test data, was performed as part of comprehensive evaluation and provision of chronic care management services.   SDOH (Social Determinants of Health) assessments and interventions performed:    CCM Care Plan  No Known Allergies  Outpatient Encounter Medications as of 01/25/2022  Medication Sig   Alcohol Swabs (B-D SINGLE USE SWABS REGULAR) PADS Use as necessary   allopurinol (ZYLOPRIM) 300 MG tablet TAKE 1 TABLET BY MOUTH EVERY DAY   amLODipine (NORVASC) 10 MG tablet TAKE 1 TABLET BY MOUTH EVERY DAY   aspirin 81 MG tablet Take 81 mg by mouth daily.   atorvastatin (LIPITOR) 40 MG tablet TAKE 1 TABLET BY MOUTH EVERY DAY   bacitracin 500 UNIT/GM ointment Apply 1 application topically 2 (two) times daily.   Blood Glucose Monitoring Suppl (ONETOUCH VERIO IQ SYSTEM) w/Device KIT USE TO CHECK BLOOD SUGAR TWICE A DAY DX CODE E11.9   clobetasol ointment (TEMOVATE) 1.60 % Apply 1 application topically 2 (two) times daily.   colchicine 0.6 MG tablet TAKE 1 TABLET (0.6 MG TOTAL) BY MOUTH DAILY  AS NEEDED (FOR GOUT ATTACK.).   Continuous Blood Gluc Sensor (FREESTYLE LIBRE 2 SENSOR) MISC 2 Devices by Does not apply route every 14 (fourteen) days.   EASY COMFORT PEN NEEDLES 31G X 5 MM MISC Used to check  Blood glucose 3 times daily or prn.   Fluocinolone Acetonide Body 0.01 % OIL Apply 1 application. topically as directed.   furosemide (LASIX) 20 MG tablet Take 1 tablet (20 mg total) by mouth daily as needed for edema.   glucose blood (ONETOUCH VERIO) test strip Use as instructed   hydrochlorothiazide (MICROZIDE) 12.5 MG capsule TAKE 1 CAPSULE BY MOUTH EVERY DAY   insulin degludec (TRESIBA FLEXTOUCH) 100 UNIT/ML FlexTouch Pen INJECT 38 UNITS SUBCUTANEOUSLY ONCE DAILY   metFORMIN (GLUCOPHAGE) 1000 MG tablet TAKE ONE TABLET BY MOUTH TWICE DAILY. TAKE WITH A MEAL.   metoprolol tartrate (LOPRESSOR) 100 MG tablet TAKE 1 TABLET BY MOUTH TWICE A DAY   NOVOLOG FLEXPEN 100 UNIT/ML FlexPen INJECT 20-22 UNITS INTO SKIN PRIOR TO BREAKFAST AND 20-22 UNITS PRIOR TO EVENING MEAL   OneTouch Delica Lancets 10X MISC Use to check blood sugar twice daily. DX Code E11.9   ramipril (ALTACE) 10 MG capsule Take 1 capsule (10 mg total) by mouth daily.   sildenafil (VIAGRA) 50 MG tablet Take 1 tablet (50 mg total) by mouth daily as needed for erectile dysfunction. (Patient not taking: Reported on 11/30/2021)   spironolactone (ALDACTONE) 25 MG tablet Take 1 tablet (25 mg total) by mouth daily. For blood pressure.   No facility-administered encounter medications on file as of  01/25/2022.    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   MYOCARDIAL INFARCTION, HX OF 11/23/2006   Coronary atherosclerosis 11/23/2006    Conditions to be addressed/monitored:HTN and DMII  Care Plan : RN Care Manager Plan of Care  Updates made by Dimitri Ped, RN since 01/25/2022 12:00 AM     Problem: Chronic Disease Management and Care Coordination Needs (DM, CAD, HTN,HLD)   Priority: High     Long-Range Goal: Establish Plan of Care for Chronic Disease Management Needs (DM, CAD, HTN,HLD) Completed 01/25/2022  Start Date: 07/19/2021  Expected End Date: 07/19/2022  Priority: High  Note:   RNCM Case closed goals met Current Barriers:  Knowledge Deficits related to plan of care for management of CAD, HTN, HLD, DMII, and Gout  Chronic Disease Management support and education needs related to CAD, HTN, HLD, DMII, and Gout  States he has been feeling good.  States he his sugars have been doing good and no higher than 102.States he does do finger sticks if he is not wearing the [place] he is taking his [name] daily and his Novolog twice a day   States he has been very active as he is in the process of moving. Denies any chest pains, shortness of breath or swelling.  States he only checks his B/P 1-2 times a month.  States he tries to eat healthy but he does like sweets sometimes.    RNCM Clinical Goal(s):  Patient will verbalize understanding of plan for management of CAD, HTN, HLD, DMII, and Gout as evidenced by voiced adherence to plan of care verbalize basic understanding of  CAD, HTN, HLD, DMII, and Gout disease process and self health management plan as evidenced by voiced understanding and teach back take all medications exactly as prescribed and will call provider for medication related questions as evidenced by dispense report and pt verbalization attend all scheduled medical appointments: Endocrinology 03/02/22, Annual Wellness  visit 02/02/22 as evidenced by medical records demonstrate Improved adherence to prescribed treatment plan for CAD, HTN, HLD, DMII, and Gout as evidenced by readings within limits, voiced adherence to plan of care continue to work with RN Care Manager to address care management and care coordination needs related to  CAD, HTN, HLD, DMII, and Gout as evidenced by adherence to CM Team Scheduled appointments through collaboration with RN Care manager, provider, and care team.   Interventions: 1:1 collaboration with primary care provider regarding development and update of comprehensive plan of care as evidenced by provider attestation and co-signature Inter-disciplinary care team collaboration (see longitudinal plan of care) Evaluation of current treatment plan related to  self management and patient's adherence to plan as established by provider    CAD Interventions: (Status:  Goal Met.) Long Term Goal Assessed understanding of CAD diagnosis Medications reviewed including medications utilized in CAD treatment plan Provided education on importance of blood pressure control in management of CAD Provided education on Importance of limiting foods high in cholesterol Counseled on importance of regular laboratory monitoring as prescribed Counseled on the importance  of exercise goals with target of 150 minutes per week Reviewed Importance of taking all medications as prescribed Advised to report any changes in symptoms or exercise tolerance   Diabetes Interventions:  (Status:  Goal Met.) Long Term Goal Assessed patient's understanding of A1c goal: <7% Provided education to patient about basic DM disease process Reviewed medications with patient and discussed importance of medication adherence Counseled on importance of regular laboratory monitoring as prescribed Discussed plans with patient for ongoing care management follow up and provided patient with direct contact information for care management  team Provided patient with written educational materials related to hypo and hyperglycemia and importance of correct treatment Advised patient, providing education and rationale, to check cbg daily and record, calling provider for findings outside established parameters Reviewed to try to limit sweets and sodas. Reviewed to try to wear his libre sensor to monitor his blood sugars. Reviewed importance of exercise for glycemic control Lab Results  Component Value Date   HGBA1C 8.7 (H) 11/25/2021   Hyperlipidemia Interventions:  (Status:  Goal Met.) Long Term Goal Medication review performed; medication list updated in electronic medical record.  Provider established cholesterol goals reviewed Counseled on importance of regular laboratory monitoring as prescribed Reviewed role and benefits of statin for ASCVD risk reduction Reviewed importance of limiting foods high in cholesterol  Hypertension Interventions:  (Status:  Goal Met.) Long Term Goal Last practice recorded BP readings:  BP Readings from Last 3 Encounters:  10/18/21 118/76  09/20/21 (!) 152/78  09/02/21 138/62  Most recent eGFR/CrCl: No results found for: EGFR  No components found for: CRCL  Evaluation of current treatment plan related to hypertension self management and patient's adherence to plan as established by provider Provided education to patient re: stroke prevention, s/s of heart attack and stroke Reviewed medications with patient and discussed importance of compliance Counseled on the importance of exercise goals with target of 150 minutes per week Discussed plans with patient for ongoing care management follow up and provided patient with direct contact information for care management team Advised patient, providing education and rationale, to monitor blood pressure daily and record, calling PCP for findings outside established parameters Provided education on prescribed diet low sodium low CHO Reviewed to try to  check his B/P at least weekly  Patient Goals/Self-Care Activities: Take all medications as prescribed Attend all scheduled provider appointments Call pharmacy for medication refills 3-7 days in advance of running out of medications Attend church or other social activities Call provider office for new concerns or questions  keep appointment with eye doctor check blood sugar at prescribed times: once daily and when you have symptoms of low or high blood sugar check feet daily for cuts, sores or redness enter blood sugar readings and medication or insulin into daily log trim toenails straight across drink 6 to 8 glasses of water each day fill half of plate with vegetables limit fast food meals to no more than 1 per week manage portion size switch to sugar-free drinks check blood pressure 3 times per week choose a place to take my blood pressure (home, clinic or office, retail store) take blood pressure log to all doctor appointments call doctor for signs and symptoms of high blood pressure eat more whole grains, fruits and vegetables, lean meats and healthy fats limit salt intake to 234m/day call for medicine refill 2 or 3 days before it runs out take all medications exactly as prescribed call doctor with any symptoms you believe are related to your  medicine  Follow Up Plan:  The patient has been provided with contact information for the care management team and has been advised to call with any health related questions or concerns.  No further follow up required: RNCM Case closed goals met       Plan:The patient has been provided with contact information for the care management team and has been advised to call with any health related questions or concerns.  No further follow up required: RNCM Case closed goals met Peter Garter RN, [facility], CDE Care Management Coordinator [place] [phone]

## 2022-02-02 ENCOUNTER — Ambulatory Visit: Payer: HMO

## 2022-02-02 ENCOUNTER — Ambulatory Visit (INDEPENDENT_AMBULATORY_CARE_PROVIDER_SITE_OTHER): Payer: HMO

## 2022-02-02 VITALS — Ht 72.0 in | Wt 215.0 lb

## 2022-02-02 DIAGNOSIS — Z0000 Encounter for general adult medical examination without abnormal findings: Secondary | ICD-10-CM

## 2022-02-02 NOTE — Progress Notes (Signed)
Subjective:   Patrick Wilcox is a 76 y.o. male who presents for Medicare Annual/Subsequent preventive examination.  Review of Systems    Virtual Visit via Telephone Note  I connected with  Patrick Wilcox on 02/02/22 at 11:00 AM EDT by telephone and verified that I am speaking with the correct person using two identifiers.  Location: Patient: Home Provider: Office Persons participating in the virtual visit: patient/Nurse Health Advisor   I discussed the limitations, risks, security and privacy concerns of performing an evaluation and management service by telephone and the availability of in person appointments. The patient expressed understanding and agreed to proceed.  Interactive audio and video telecommunications were attempted between this nurse and patient, however failed, due to patient having technical difficulties OR patient did not have access to video capability.  We continued and completed visit with audio only.  Some vital signs may be absent or patient reported.   Criselda Peaches, LPN  Cardiac Risk Factors include: advanced age (>62mn, >>52women);diabetes mellitus;hypertension;male gender     Objective:    Today's Vitals   02/02/22 1109  Weight: 215 lb (97.5 kg)  Height: 6' (1.829 m)   Body mass index is 29.16 kg/m.     02/02/2022   11:20 AM 07/19/2021   11:01 AM 07/08/2019    2:41 PM 02/06/2019    1:07 PM 01/01/2016    9:44 AM  Advanced Directives  Does Patient Have a Medical Advance Directive? Yes No Yes No No  Type of [place] will  [place] will    Does patient want to make changes to medical advance directive? No - Patient declined  No - Patient declined    Copy of HM[place]in Chart? No - copy requested      Would patient like information on creating a medical advance directive?  No - Patient declined  Yes (MAU/Ambulatory/Procedural Areas - Information given)  Yes - Educational materials given    Current Medications (verified) Outpatient Encounter Medications as of 02/02/2022  Medication Sig   Alcohol Swabs (B-D SINGLE USE SWABS REGULAR) PADS Use as necessary   allopurinol (ZYLOPRIM) 300 MG tablet TAKE 1 TABLET BY MOUTH EVERY DAY   amLODipine (NORVASC) 10 MG tablet TAKE 1 TABLET BY MOUTH EVERY DAY   aspirin 81 MG tablet Take 81 mg by mouth daily.   atorvastatin (LIPITOR) 40 MG tablet TAKE 1 TABLET BY MOUTH EVERY DAY   bacitracin 500 UNIT/GM ointment Apply 1 application topically 2 (two) times daily.   Blood Glucose Monitoring Suppl (ONETOUCH VERIO IQ SYSTEM) w/Device KIT USE TO CHECK BLOOD SUGAR TWICE A DAY DX CODE E11.9   clobetasol ointment (TEMOVATE) 06.73% Apply 1 application topically 2 (two) times daily.   colchicine 0.6 MG tablet TAKE 1 TABLET (0.6 MG TOTAL) BY MOUTH DAILY AS NEEDED (FOR GOUT ATTACK.).   Continuous Blood Gluc Sensor (FREESTYLE LIBRE 2 SENSOR) MISC 2 Devices by Does not apply route every 14 (fourteen) days.   EASY COMFORT PEN NEEDLES 31G X 5 MM MISC Used to check  Blood glucose 3 times daily or prn.   Fluocinolone Acetonide Body 0.01 % OIL Apply 1 application. topically as directed.   furosemide (LASIX) 20 MG tablet Take 1 tablet (20 mg total) by mouth daily as needed for edema.   glucose blood (ONETOUCH VERIO) test strip Use as instructed   hydrochlorothiazide (MICROZIDE) 12.5 MG capsule TAKE 1 CAPSULE BY MOUTH EVERY  DAY   insulin degludec (TRESIBA FLEXTOUCH) 100 UNIT/ML FlexTouch Pen INJECT 38 UNITS SUBCUTANEOUSLY ONCE DAILY   metFORMIN (GLUCOPHAGE) 1000 MG tablet TAKE ONE TABLET BY MOUTH TWICE DAILY. TAKE WITH A MEAL.   metoprolol tartrate (LOPRESSOR) 100 MG tablet TAKE 1 TABLET BY MOUTH TWICE A DAY   NOVOLOG FLEXPEN 100 UNIT/ML FlexPen INJECT 20-22 UNITS INTO SKIN PRIOR TO BREAKFAST AND 20-22 UNITS PRIOR TO EVENING MEAL   OneTouch Delica Lancets 93Y MISC Use to check blood sugar twice daily. DX Code E11.9   ramipril  (ALTACE) 10 MG capsule Take 1 capsule (10 mg total) by mouth daily.   sildenafil (VIAGRA) 50 MG tablet Take 1 tablet (50 mg total) by mouth daily as needed for erectile dysfunction. (Patient not taking: Reported on 11/30/2021)   spironolactone (ALDACTONE) 25 MG tablet Take 1 tablet (25 mg total) by mouth daily. For blood pressure.   No facility-administered encounter medications on file as of 02/02/2022.    Allergies (verified) Patient has no known allergies.   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   PNEUMONIA, LEFT LOWER LOBE 09/08/2009   Rash    Past Surgical History:  Procedure Laterality Date   CORONARY ANGIOPLASTY WITH STENT PLACEMENT     CORONARY ARTERY BYPASS GRAFT  09-04-01   Family History  Problem Relation Age of Onset   Heart disease Mother    Heart disease Father    Diabetes Sister    Diabetes Brother    Social History   Socioeconomic History   Marital status: Widowed    Spouse name: Not on file   Number of children: 2   Years of education: Not on file   Highest education level: Not on file  Occupational History    Comment: Retired from [employer] Use   Smoking status: Never   Smokeless tobacco: Never  Vaping Use   Vaping Use: Never used  Substance and Sexual Activity   Alcohol use: No    Comment: stopped drinking etoh completely in 05-Sep-1999   Drug use: No   Sexual activity: Yes    Comment: uses viagra  Other Topics Concern   Not on file  Social History Narrative   Lives alone   Retired from [employer] in 09/05/99   Twin sons, one son died of a stroke this year 09-04-18) at age 60, his living son resides in [place]    His wife died of breast cancer 11 years ago    Social Determinants of Health   Financial  Resource Strain: Low Risk  (02/02/2022)   Overall Financial Resource Strain (CARDIA)    Difficulty of Paying Living Expenses: Not hard at all  Food Insecurity: No [place] (02/02/2022)   Hunger Vital Sign    Worried About Running Out of Food in the Last Year: Never true    [place] in the Last Year: Never true  Transportation Needs: No Transportation Needs (02/02/2022)   PRAPARE - Hydrologist (Medical): No    Lack of Transportation (Non-Medical): No  Physical Activity: Sufficiently Active (02/02/2022)   Exercise Vital Sign    Days of Exercise per Week: 7 days    Minutes of Exercise per Session: 150+ min  Stress: No Stress Concern Present (02/02/2022)   [place]    Feeling of Stress : Not at all  Social Connections: Moderately Integrated (02/02/2022)   Social Connection and Isolation Panel [NHANES]    Frequency of Communication with Friends and Family: More than three times a week    Frequency of Social Gatherings with Friends and Family: More than three times a week    Attends Religious Services: More than 4 times per year    Active Member of [employer] or Organizations: Yes    Attends Music therapist: More than 4 times per year    Marital Status: Never married    Tobacco Counseling Counseling given: Not Answered   Clinical Intake: Nutrition Risk Assessment:  Has the patient had any N/V/D within the last 2 months?  No  Does the patient have any non-healing wounds?  No  Has the patient had any unintentional weight loss or weight gain?  No   Diabetes:  Is the patient diabetic?  Yes  If diabetic, was a CBG obtained today?  Yes CBG 20 Taken by patient Did the patient bring in their glucometer from home?  No  How often do you monitor your CBG's? Daily.   Financial Strains and Diabetes Management:  Are you having any financial strains with the device, your  supplies or your medication? No .  Does the patient want to be seen by Chronic Care Management for management of their diabetes?  No  Would the patient like to be referred to a Nutritionist or for Diabetic Management?  No   Diabetic Exams:  Diabetic Eye Exam: Completed No. Overdue for diabetic eye exam. Pt has been advised about the importance in completing this exam. A referral has been placed today. Message sent to referral coordinator for scheduling purposes. Advised pt to expect a call from office referred to regarding appt.  Diabetic Foot Exam: Completed No. Pt has been advised about the importance in completing this exam. Pt is scheduled for diabetic foot exam on Followed by Dr Dwyane Dee.   Pre-visit preparation completed: No   Diabetic? Yes  Interpreter Needed?: No  Activities of Daily Living    02/02/2022   11:17 AM  In your present state of health, do you have any difficulty performing the following activities:  Hearing? 0  Vision? 0  Difficulty concentrating or making decisions? 0  Walking or climbing stairs? 0  Dressing or bathing? 0  Doing errands, shopping? 0  Preparing Food and eating ? N  Using the Toilet? N  In the past six months, have you accidently leaked urine? N  Do you have problems with loss of bowel control? N  Managing your Medications? N  Managing your Finances? N  Housekeeping or managing your Housekeeping? N    Patient Care Team: [name], [name], MD as PCP - General (Family Medicine) [name], MD as PCP - Electrophysiology (Cardiology) [name], MD as Consulting Physician (Endocrinology) [name], [facility] as Pharmacist (Pharmacist)  Indicate any recent Medical Services you may have received from other than [facility] providers in the past year (date may be approximate).     Assessment:   This is a routine wellness examination for Patrick Wilcox.  Hearing/Vision screen Hearing Screening - Comments:: No hearing difficulty Vision Screening -  Comments:: No vision difficulty.   Dietary issues and exercise activities discussed: Exercise limited by: None identified   Goals Addressed  This Visit's Progress     Patient stated (pt-stated)        Finish car project       Depression Screen    02/02/2022   11:15 AM 10/18/2021    9:56 AM 07/19/2021   11:01 AM 01/20/2021   10:56 AM 12/16/2020   12:00 PM 01/01/2020   10:08 AM 05/15/2019   10:08 PM  PHQ 2/9 Scores  PHQ - 2 Score 0 0 0 0 0 0 0    Fall Risk    02/02/2022   11:18 AM 07/19/2021   11:00 AM 01/20/2021   10:59 AM 12/16/2020   12:00 PM 02/04/2020    1:43 PM  [place] in the past year? 0 0 0 0 0  Comment     Emmi Telephone Survey: data to providers prior to load  Number falls in past yr: 0 0 0 0   Injury with Fall? 0 0 0 0   Risk for fall due to : No Fall Risks No Fall Risks     Follow up  Education provided;Falls prevention discussed Falls prevention discussed Education provided     FALL RISK PREVENTION PERTAINING TO THE HOME:  Any stairs in or around the home? Yes  If so, are there any without handrails? No  Home free of loose throw rugs in walkways, pet beds, electrical cords, etc? Yes Adequate lighting in your home to reduce risk of falls? Yes   ASSISTIVE DEVICES UTILIZED TO PREVENT FALLS:  Life alert? No  Use of a cane, walker or w/c? No  Grab bars in the bathroom? Yes  Shower chair or bench in shower? No  Elevated toilet seat or a handicapped toilet? No   TIMED UP AND GO:  Was the test performed? No . Audio Visit  Cognitive Function:        02/02/2022   11:20 AM 01/20/2021   11:01 AM  6CIT Screen  What Year? 0 points 4 points  What month? 0 points 0 points  What time? 0 points 0 points  Count back from 20 0 points 0 points  Months in reverse 4 points 4 points  Repeat phrase 0 points 6 points  Total Score 4 points 14 points    Immunizations Immunization History  Administered Date(s) Administered   Fluad Quad(high  Dose 65+) 05/15/2019, 04/08/2020, 05/20/2021   Influenza Split 04/13/2011, 05/31/2012   Influenza Whole 05/21/2007, 06/22/2009, 06/15/2010   Influenza, High Dose Seasonal PF 05/02/2013, 03/31/2016, 06/18/2018   Influenza,inj,Quad PF,6+ Mos 04/02/2014, 03/30/2015, 06/08/2017   PFIZER(Purple Top)SARS-COV-2 Vaccination 09/12/2019, 10/04/2019, 06/20/2020   Pneumococcal Conjugate-13 08/26/2015   Pneumococcal Polysaccharide-23 05/02/2013   Tetanus 08/02/2013   Zoster Recombinat (Shingrix) 06/20/2020, 09/08/2020      Flu Vaccine status: Up to date  Pneumococcal vaccine status: Up to date  Covid-19 vaccine status: Completed vaccines  Qualifies for Shingles Vaccine? Yes   Zostavax completed Yes   Shingrix Completed?: Yes  Screening Tests Health Maintenance  Topic Date Due   OPHTHALMOLOGY EXAM  10/12/2019   COVID-19 Vaccine (4 - Booster for Pfizer series) 02/18/2022 (Originally 08/15/2020)   INFLUENZA VACCINE  02/08/2022   FOOT EXAM  05/20/2022   HEMOGLOBIN A1C  05/28/2022   TETANUS/TDAP  08/03/2023   Pneumonia Vaccine 79+ Years old  Completed   Hepatitis C Screening  Completed   Zoster Vaccines- Shingrix  Completed   HPV VACCINES  Aged Out   COLONOSCOPY (Pts 45-59yr Insurance coverage will need to be confirmed)  Discontinued    Health Maintenance  Health Maintenance Due  Topic Date Due   OPHTHALMOLOGY EXAM  10/12/2019    Colorectal cancer screening: No longer required.   Lung Cancer Screening: (Low Dose CT Chest recommended if Age 47-80 years, 30 pack-year currently smoking OR have quit w/in 15years.) does not qualify.     Additional Screening:  Hepatitis C Screening: does qualify; Completed 09/09/19  Vision Screening: Recommended annual ophthalmology exams for early detection of glaucoma and other disorders of the eye. Is the patient up to date with their annual eye exam?  Yes  Who is the provider or what is the name of the office in which the patient attends annual  eye exams? [facility] If pt is not established with a provider, would they like to be referred to a provider to establish care? No .   Dental Screening: Recommended annual dental exams for proper oral hygiene  Community Resource Referral / Chronic Care Management:  CRR required this visit?  No   CCM required this visit?  No      Plan:     I have personally reviewed and noted the following in the patient's chart:   Medical and social history Use of alcohol, tobacco or illicit drugs  Current medications and supplements including opioid prescriptions. Patient is not currently taking opioid prescriptions. Functional ability and status Nutritional status Physical activity Advanced directives List of other physicians Hospitalizations, surgeries, and ER visits in previous 12 months Vitals Screenings to include cognitive, depression, and falls Referrals and appointments  In addition, I have reviewed and discussed with patient certain preventive protocols, quality metrics, and best practice recommendations. A written personalized care plan for preventive services as well as general preventive health recommendations were provided to patient.     Criselda Peaches, LPN   [id number]   Nurse Notes: None

## 2022-02-02 NOTE — Patient Instructions (Addendum)
Patrick Wilcox , Thank you for taking time to come for your Medicare Wellness Visit. I appreciate your ongoing commitment to your health goals. Please review the following plan we discussed and let me know if I can assist you in the future.   These are the goals we discussed:  Goals       exercise more      Patient stated (pt-stated)      Finish car project        This is a list of the screening recommended for you and due dates:  Health Maintenance  Topic Date Due   Eye exam for diabetics  10/12/2019   COVID-19 Vaccine (4 - Booster for Pfizer series) 02/18/2022*   Flu Shot  02/08/2022   Complete foot exam   05/20/2022   Hemoglobin A1C  05/28/2022   Tetanus Vaccine  08/03/2023   Pneumonia Vaccine  Completed   Hepatitis C Screening: USPSTF Recommendation to screen - Ages 18-79 yo.  Completed   Zoster (Shingles) Vaccine  Completed   HPV Vaccine  Aged Out   Colon Cancer Screening  Discontinued  *Topic was postponed. The date shown is not the original due date.    Advanced directives: Yes  Conditions/risks identified: None  Next appointment: Follow up in one year for your annual wellness visit.    Preventive Care 46 Years and Older, Male Preventive care refers to lifestyle choices and visits with your health care provider that can promote health and wellness. What does preventive care include? A yearly physical exam. This is also called an annual well check. Dental exams once or twice a year. Routine eye exams. Ask your health care provider how often you should have your eyes checked. Personal lifestyle choices, including: Daily care of your teeth and gums. Regular physical activity. Eating a healthy diet. Avoiding tobacco and drug use. Limiting alcohol use. Practicing safe sex. Taking low doses of aspirin every day. Taking vitamin and mineral supplements as recommended by your health care provider. What happens during an annual well check? The services and screenings  done by your health care provider during your annual well check will depend on your age, overall health, lifestyle risk factors, and family history of disease. Counseling  Your health care provider may ask you questions about your: Alcohol use. Tobacco use. Drug use. Emotional well-being. Home and relationship well-being. Sexual activity. Eating habits. History of falls. Memory and ability to understand (cognition). Work and work Statistician. Screening  You may have the following tests or measurements: Height, weight, and BMI. Blood pressure. Lipid and cholesterol levels. These may be checked every 5 years, or more frequently if you are over 20 years old. Skin check. Lung cancer screening. You may have this screening every year starting at age 60 if you have a 30-pack-year history of smoking and currently smoke or have quit within the past 15 years. Fecal occult blood test (FOBT) of the stool. You may have this test every year starting at age 49. Flexible sigmoidoscopy or colonoscopy. You may have a sigmoidoscopy every 5 years or a colonoscopy every 10 years starting at age 68. Prostate cancer screening. Recommendations will vary depending on your family history and other risks. Hepatitis C blood test. Hepatitis B blood test. Sexually transmitted disease (STD) testing. Diabetes screening. This is done by checking your blood sugar (glucose) after you have not eaten for a while (fasting). You may have this done every 1-3 years. Abdominal aortic aneurysm (AAA) screening. You may need this  if you are a current or former smoker. Osteoporosis. You may be screened starting at age 52 if you are at high risk. Talk with your health care provider about your test results, treatment options, and if necessary, the need for more tests. Vaccines  Your health care provider may recommend certain vaccines, such as: Influenza vaccine. This is recommended every year. Tetanus, diphtheria, and acellular  pertussis (Tdap, Td) vaccine. You may need a Td booster every 10 years. Zoster vaccine. You may need this after age 58. Pneumococcal 13-valent conjugate (PCV13) vaccine. One dose is recommended after age 97. Pneumococcal polysaccharide (PPSV23) vaccine. One dose is recommended after age 76. Talk to your health care provider about which screenings and vaccines you need and how often you need them. This information is not intended to replace advice given to you by your health care provider. Make sure you discuss any questions you have with your health care provider. Document Released: 07/24/2015 Document Revised: 03/16/2016 Document Reviewed: 04/28/2015 Elsevier Interactive Patient Education  2017 [employer] Prevention in the Home Falls can cause injuries. They can happen to people of all ages. There are many things you can do to make your home safe and to help prevent falls. What can I do on the outside of my home? Regularly fix the edges of walkways and driveways and fix any cracks. Remove anything that might make you trip as you walk through a door, such as a raised step or threshold. Trim any bushes or trees on the path to your home. Use bright outdoor lighting. Clear any walking paths of anything that might make someone trip, such as rocks or tools. Regularly check to see if handrails are loose or broken. Make sure that both sides of any steps have handrails. Any raised decks and porches should have guardrails on the edges. Have any leaves, snow, or ice cleared regularly. Use sand or salt on walking paths during winter. Clean up any spills in your garage right away. This includes oil or grease spills. What can I do in the bathroom? Use night lights. Install grab bars by the toilet and in the tub and shower. Do not use towel bars as grab bars. Use non-skid mats or decals in the tub or shower. If you need to sit down in the shower, use a plastic, non-slip stool. Keep the floor  dry. Clean up any water that spills on the floor as soon as it happens. Remove soap buildup in the tub or shower regularly. Attach bath mats securely with double-sided non-slip rug tape. Do not have throw rugs and other things on the floor that can make you trip. What can I do in the bedroom? Use night lights. Make sure that you have a light by your bed that is easy to reach. Do not use any sheets or blankets that are too big for your bed. They should not hang down onto the floor. Have a firm chair that has side arms. You can use this for support while you get dressed. Do not have throw rugs and other things on the floor that can make you trip. What can I do in the kitchen? Clean up any spills right away. Avoid walking on wet floors. Keep items that you use a lot in easy-to-reach places. If you need to reach something above you, use a strong step stool that has a grab bar. Keep electrical cords out of the way. Do not use floor polish or wax that makes floors slippery.  If you must use wax, use non-skid floor wax. Do not have throw rugs and other things on the floor that can make you trip. What can I do with my stairs? Do not leave any items on the stairs. Make sure that there are handrails on both sides of the stairs and use them. Fix handrails that are broken or loose. Make sure that handrails are as long as the stairways. Check any carpeting to make sure that it is firmly attached to the stairs. Fix any carpet that is loose or worn. Avoid having throw rugs at the top or bottom of the stairs. If you do have throw rugs, attach them to the floor with carpet tape. Make sure that you have a light switch at the top of the stairs and the bottom of the stairs. If you do not have them, ask someone to add them for you. What else can I do to help prevent falls? Wear shoes that: Do not have high heels. Have rubber bottoms. Are comfortable and fit you well. Are closed at the toe. Do not wear  sandals. If you use a stepladder: Make sure that it is fully opened. Do not climb a closed stepladder. Make sure that both sides of the stepladder are locked into place. Ask someone to hold it for you, if possible. Clearly [place] and make sure that you can see: Any grab bars or handrails. First and last steps. Where the edge of each step is. Use tools that help you move around (mobility aids) if they are needed. These include: Canes. Walkers. Scooters. Crutches. Turn on the lights when you go into a dark area. Replace any light bulbs as soon as they burn out. Set up your furniture so you have a clear path. Avoid moving your furniture around. If any of your floors are uneven, fix them. If there are any pets around you, be aware of where they are. Review your medicines with your doctor. Some medicines can make you feel dizzy. This can increase your chance of falling. Ask your doctor what other things that you can do to help prevent falls. This information is not intended to replace advice given to you by your health care provider. Make sure you discuss any questions you have with your health care provider. Document Released: 04/23/2009 Document Revised: 12/03/2015 Document Reviewed: 08/01/2014 Elsevier Interactive Patient Education  2017 [employer].

## 2022-02-07 DIAGNOSIS — I10 Essential (primary) hypertension: Secondary | ICD-10-CM | POA: Diagnosis not present

## 2022-02-07 DIAGNOSIS — E1159 Type 2 diabetes mellitus with other circulatory complications: Secondary | ICD-10-CM

## 2022-02-10 ENCOUNTER — Other Ambulatory Visit: Payer: Self-pay | Admitting: Family Medicine

## 2022-02-28 ENCOUNTER — Other Ambulatory Visit (INDEPENDENT_AMBULATORY_CARE_PROVIDER_SITE_OTHER): Payer: HMO

## 2022-02-28 DIAGNOSIS — E782 Mixed hyperlipidemia: Secondary | ICD-10-CM

## 2022-02-28 DIAGNOSIS — E1165 Type 2 diabetes mellitus with hyperglycemia: Secondary | ICD-10-CM | POA: Diagnosis not present

## 2022-02-28 DIAGNOSIS — Z794 Long term (current) use of insulin: Secondary | ICD-10-CM | POA: Diagnosis not present

## 2022-02-28 LAB — BASIC METABOLIC PANEL
BUN: 20 mg/dL (ref 6–23)
CO2: 27 mEq/L (ref 19–32)
Calcium: 9.5 mg/dL (ref 8.4–10.5)
Chloride: 96 mEq/L (ref 96–112)
Creatinine, Ser: 1.07 mg/dL (ref 0.40–1.50)
GFR: 67.47 mL/min (ref >60.00)
Glucose, Bld: 196 mg/dL — ABNORMAL HIGH (ref 70–99)
Potassium: 4.1 mEq/L (ref 3.5–5.1)
Sodium: 132 mEq/L — ABNORMAL LOW (ref 135–145)

## 2022-02-28 LAB — HEMOGLOBIN A1C: Hgb A1c MFr Bld: 7.8 % — ABNORMAL HIGH (ref 4.6–6.5)

## 2022-02-28 LAB — LDL CHOLESTEROL, DIRECT: Direct LDL: 71 mg/dL

## 2022-03-01 NOTE — Progress Notes (Unsigned)
Patient ID: Patrick Wilcox, male   DOB: February 12, 1946, 76 y.o.   MRN: [id number]    Reason for Appointment: Followup for Type 2 Diabetes   History of Present Illness:          Diagnosis: Type 2 diabetes mellitus, date of diagnosis: 2004       Past history:  At diagnosis he was having symptoms of drowsiness, difficulty breathing and blood sugar was reportedly over 600 He was initially treated with insulin but subsequently switched back to oral drugs He may have taken metformin and other medications for some time before going back on insulin His records indicate that he had been taking Amaryl and Januvia before going back on insulin in mid-2014 when his A1c had gone up over 13 Initially was given premixed insulin and then switched to Lantus and Humalog Record of his A1c indicates that his levels previously had been over 8% since 02/2012 He  had much better blood sugar control on his last visit with his changing his lifestyle and being instructed by the nurse educator  His Lantus did not appear to have 24 hour control with once a day injection  Recent history:   INSULIN regimen is described as:  38 units Tresiba daily in pm., Novolog 20 units after breakfast and dinner        Oral hypoglycemic drugs the patient is taking are: Metformin 1 g twice a day     His A1c is 8.7   Glucose monitoring, current management and problems identified: He was instructed on how to use the freestyle libre sensor but he forgot how to do it and still has not use it He did not call to let us know he needed further help  His A1c is higher He says that he has been traveling and not eating his normal meals  Also when eating out he is usually drinking regular soft drinks  He has gained another 3 pounds  Also he thinks he has difficulty getting a refill on his [name] but only for 2 or 3 days  He does not adjust his NovoLog based on his meal size or blood sugar Has not done any readings after  meals He has however tried to take his NovoLog more consistently before meals instead of after may still forget No hypoglycemia occurring at any time with lowest blood sugar 80 His lab glucose was 220 after breakfast and he likely had not taken his NovoLog that morning   DIET: Has seen the dietitian in 2014 and nurse educator in 8/18        Meals: 2 meals per day 9-10 am and 6 pm.   Breakfast is  eggs;  toast, eggs, has fruit for snacks.  Evening meal usually vegetables, tuna salad, chicken.       Compliance with the medical regimen:  Fair    Glucose monitoring: Checking less than 1 times a day        Glucometer:  One Touch Verio    Blood Glucose readings  from meter download   PRE-MEAL Fasting Lunch Dinner Bedtime Overall  Glucose range: 80-253      Mean/median:     152   POST-MEAL PC Breakfast PC Lunch PC Dinner  Glucose range: 228  ?  Mean/median:      Previously:  PRE-MEAL Fasting Midday Dinner Bedtime Overall  Glucose range: 96-199 94-264     Mean/median: 125    137   POST-MEAL PC Breakfast PC Lunch PC  Dinner  Glucose range:   ?  Mean/median:        Exercise: Walking recently regularly, 4-5/7 days a week  Weight history:  Wt Readings from Last 3 Encounters:  02/02/22 215 lb (97.5 kg)  11/30/21 221 lb (100.2 kg)  10/18/21 218 lb 8 oz (99.1 kg)    Glycemic control:   Lab Results  Component Value Date   HGBA1C 7.8 (H) 02/28/2022   HGBA1C 8.7 (H) 11/25/2021   HGBA1C 8.0 (H) 08/31/2021   Lab Results  Component Value Date   MICROALBUR 68.8 (H) 11/25/2021   LDLCALC 48 11/10/2020   CREATININE 1.07 02/28/2022   Lab Results  Component Value Date   FRUCTOSAMINE 305 (H) 06/01/2020   FRUCTOSAMINE 296 (H) 12/31/2019   FRUCTOSAMINE 315 (H) 09/09/2019    Other active problems: See review of systems   Lab on 02/28/2022  Component Date Value Ref Range Status   Direct LDL 02/28/2022 71.0  mg/dL Final   Optimal:  <100 mg/dLNear or Above Optimal:  100-129  mg/dLBorderline High:  130-159 mg/dLHigh:  160-189 mg/dLVery High:  >190 mg/dL   Sodium 02/28/2022 132 (L)  135 - 145 mEq/L Final   Potassium 02/28/2022 4.1  3.5 - 5.1 mEq/L Final   Chloride 02/28/2022 96  96 - 112 mEq/L Final   CO2 02/28/2022 27  19 - 32 mEq/L Final   Glucose, Bld 02/28/2022 196 (H)  70 - 99 mg/dL Final   BUN 02/28/2022 20  6 - 23 mg/dL Final   Creatinine, Ser 02/28/2022 1.07  0.40 - 1.50 mg/dL Final   GFR 02/28/2022 67.47  >60.00 mL/min Final   Calculated using the CKD-EPI Creatinine Equation (2021)   Calcium 02/28/2022 9.5  8.4 - 10.5 mg/dL Final   Hgb A1c MFr Bld 02/28/2022 7.8 (H)  4.6 - 6.5 % Final   Glycemic Control Guidelines for People with Diabetes:Non Diabetic:  <6%Goal of Therapy: <7%Additional Action Suggested:  >8%       Allergies as of 03/02/2022   No Known Allergies      Medication List        Accurate as of March 01, 2022  9:19 PM. If you have any questions, ask your nurse or doctor.          allopurinol 300 MG tablet Commonly known as: ZYLOPRIM TAKE 1 TABLET BY MOUTH EVERY DAY   amLODipine 10 MG tablet Commonly known as: NORVASC TAKE 1 TABLET BY MOUTH EVERY DAY   aspirin 81 MG tablet Take 81 mg by mouth daily.   atorvastatin 40 MG tablet Commonly known as: LIPITOR TAKE 1 TABLET BY MOUTH EVERY DAY   B-D SINGLE USE SWABS REGULAR Pads Use as necessary   bacitracin 500 UNIT/GM ointment Apply 1 application topically 2 (two) times daily.   clobetasol ointment 0.05 % Commonly known as: TEMOVATE Apply 1 application topically 2 (two) times daily.   colchicine 0.6 MG tablet TAKE 1 TABLET (0.6 MG TOTAL) BY MOUTH DAILY AS NEEDED (FOR GOUT ATTACK.).   Easy Comfort Pen Needles 31G X 5 MM Misc Generic drug: Insulin Pen Needle Used to check  Blood glucose 3 times daily or prn.   Fluocinolone Acetonide Body 0.01 % Oil Apply 1 application. topically as directed.   FreeStyle Libre 2 Sensor Misc 2 Devices by Does not apply route every  14 (fourteen) days.   furosemide 20 MG tablet Commonly known as: LASIX Take 1 tablet (20 mg total) by mouth daily as needed for edema.  hydrochlorothiazide 12.5 MG capsule Commonly known as: MICROZIDE TAKE 1 CAPSULE BY MOUTH EVERY DAY   metFORMIN 1000 MG tablet Commonly known as: GLUCOPHAGE TAKE ONE TABLET BY MOUTH TWICE DAILY. TAKE WITH A MEAL.   metoprolol tartrate 100 MG tablet Commonly known as: LOPRESSOR TAKE 1 TABLET BY MOUTH TWICE A DAY   NovoLOG FlexPen 100 UNIT/ML FlexPen Generic drug: insulin aspart INJECT 20-22 UNITS INTO SKIN PRIOR TO BREAKFAST AND 20-22 UNITS PRIOR TO EVENING MEAL   OneTouch Delica Lancets 38H Misc Use to check blood sugar twice daily. DX Code E11.9   OneTouch Verio IQ System w/Device Kit USE TO CHECK BLOOD SUGAR TWICE A DAY DX CODE E11.9   OneTouch Verio test strip Generic drug: glucose blood Use as instructed   ramipril 10 MG capsule Commonly known as: ALTACE Take 1 capsule (10 mg total) by mouth daily.   sildenafil 50 MG tablet Commonly known as: VIAGRA Take 1 tablet (50 mg total) by mouth daily as needed for erectile dysfunction.   spironolactone 25 MG tablet Commonly known as: ALDACTONE Take 1 tablet (25 mg total) by mouth daily. For blood pressure.   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 He has never used smokeless tobacco. He reports that he does not drink alcohol and does not use drugs.    Review of Systems        Lipids:  LDL has been treated to goal with Lipitor 40 mg Has history of CAD, followed by PCP HDL has been low       Lab Results  Component Value Date   CHOL 102 11/10/2020   HDL 27.70 (L) 11/10/2020   LDLCALC 48 11/10/2020   LDLDIRECT 71.0 02/28/2022   TRIG 129.0 11/10/2020   CHOLHDL 4 11/10/2020       Is high blood pressure has been treated with Norvasc 10 mg, ramipril 10 mg, HCTZ 12.5, Aldactone 25 mg and metoprolol, Followed by PCP.   Bp 130 at home  He is on Lasix from PCP     BP Readings from Last 3 Encounters:  11/30/21 (!) 142/72  10/18/21 118/76  09/20/21 (!) 152/78   Lab Results  Component Value Date   K 4.1 82/99/3716   Complications: Microalbuminuria Microalbumin history:  Lab Results  Component Value Date   MICRALBCREAT 39.9 (H) 11/25/2021   MICRALBCREAT 39.9 (H) 02/11/2021   MICRALBCREAT 55.6 (H) 07/30/2020   MICRALBCREAT 98.1 (H) 09/09/2019       Diabetic foot exam was done in 11/22 by his PCP  Last exam with eye doctor  was 4/20 and has not made  an appointment despite reminders   LABS:  Lab on 02/28/2022  Component Date Value Ref Range Status   Direct LDL 02/28/2022 71.0  mg/dL Final   Optimal:  <100 mg/dLNear or Above Optimal:  100-129 mg/dLBorderline High:  130-159 mg/dLHigh:  160-189 mg/dLVery High:  >190 mg/dL   Sodium 02/28/2022 132 (L)  135 - 145 mEq/L Final   Potassium 02/28/2022 4.1  3.5 - 5.1 mEq/L Final   Chloride 02/28/2022 96  96 - 112 mEq/L Final   CO2 02/28/2022 27  19 - 32 mEq/L Final   Glucose, Bld 02/28/2022 196 (H)  70 - 99 mg/dL Final   BUN 02/28/2022 20  6 - 23 mg/dL Final   Creatinine, Ser 02/28/2022 1.07  0.40 - 1.50 mg/dL Final   GFR 02/28/2022  67.47  >60.00 mL/min Final   Calculated using the CKD-EPI Creatinine Equation (2021)   Calcium 02/28/2022 9.5  8.4 - 10.5 mg/dL Final   Hgb A1c MFr Bld 02/28/2022 7.8 (H)  4.6 - 6.5 % Final   Glycemic Control Guidelines for People with Diabetes:Non Diabetic:  <6%Goal of Therapy: <7%Additional Action Suggested:  >8%     Physical Examination:  There were no vitals taken for this visit.    ASSESSMENT/PLAN  Diabetes type 2, On insulin   See history of present illness for detailed discussion of current management, problems identified and blood sugar patterns.  A1c is now 8.7, increased further  He has been on basal bolus insulin and Metformin  As above he has been off his diet and likely not taking insulin consistently at meals Has gained more weight even though he thinks he is walking regularly Currently with his fasting readings being quite variable and likely is not having consistent postprandial control the night before also   Plan: He needs to be consistent with his diet as discussed before Stop drinking regular soft drinks  He will be instructed by the nurse educator on how to use the freestyle libre sensor Reminded him that he must check his blood sugars 4 times a day with this Also need to make sure he takes his NovoLog consistently before meals He may be able to modify his diet better if he is aware of his postprandial readings and this was discussed No change in basal insulin  HYPERTENSION: Blood pressure is overall fairly well controlled Microalbumin unchanged  Needs follow-up lipids  There are no Patient Instructions on file for this visit.    Total visit time including counseling = 30 minutes   [name] 03/01/2022, 9:19 PM   Note: This office note was prepared with Dragon voice recognition system technology. Any transcriptional errors that result from this process are unintentional.

## 2022-03-02 ENCOUNTER — Encounter: Payer: Self-pay | Admitting: Endocrinology

## 2022-03-02 ENCOUNTER — Ambulatory Visit (INDEPENDENT_AMBULATORY_CARE_PROVIDER_SITE_OTHER): Payer: HMO | Admitting: Endocrinology

## 2022-03-02 VITALS — BP 128/68 | HR 64 | Ht 72.0 in | Wt 209.0 lb

## 2022-03-02 DIAGNOSIS — E871 Hypo-osmolality and hyponatremia: Secondary | ICD-10-CM | POA: Diagnosis not present

## 2022-03-02 DIAGNOSIS — E1165 Type 2 diabetes mellitus with hyperglycemia: Secondary | ICD-10-CM

## 2022-03-02 DIAGNOSIS — Z794 Long term (current) use of insulin: Secondary | ICD-10-CM | POA: Diagnosis not present

## 2022-03-02 MED ORDER — FREESTYLE LIBRE 2 SENSOR MISC: 2.0000 | 3 refills | Status: DC

## 2022-03-02 NOTE — Patient Instructions (Addendum)
Tresiba 35 in pm  Novolog 15 -20 based on meals 2x daily  Check blood sugars on waking up 5 days a week  Also check blood sugars about 2 hours after meals and do this after different meals by rotation  Recommended blood sugar levels on waking up are 90-130 and about 2 hours after meal is 130-160  Please bring your blood sugar monitor to each visit, thank you  No donuts! No regular soft drinks  Stop hydrochlorothiazide sodium level is low from this  If blood pressure goes up over 140 please message Dr. [name]

## 2022-03-03 ENCOUNTER — Ambulatory Visit: Payer: HMO | Admitting: Endocrinology

## 2022-03-05 ENCOUNTER — Other Ambulatory Visit: Payer: Self-pay | Admitting: Family Medicine

## 2022-03-05 DIAGNOSIS — M1A9XX0 Chronic gout, unspecified, without tophus (tophi): Secondary | ICD-10-CM

## 2022-03-17 ENCOUNTER — Other Ambulatory Visit: Payer: Self-pay | Admitting: Endocrinology

## 2022-03-17 DIAGNOSIS — I10 Essential (primary) hypertension: Secondary | ICD-10-CM

## 2022-03-24 ENCOUNTER — Telehealth: Payer: Self-pay | Admitting: Endocrinology

## 2022-03-24 DIAGNOSIS — E1169 Type 2 diabetes mellitus with other specified complication: Secondary | ICD-10-CM

## 2022-03-29 ENCOUNTER — Other Ambulatory Visit: Payer: Self-pay

## 2022-03-29 DIAGNOSIS — E1169 Type 2 diabetes mellitus with other specified complication: Secondary | ICD-10-CM

## 2022-03-29 MED ORDER — TRESIBA FLEXTOUCH 100 UNIT/ML ~~LOC~~ SOPN: PEN_INJECTOR | SUBCUTANEOUS | 2 refills | Status: DC

## 2022-03-29 NOTE — Telephone Encounter (Signed)
Patient came to advise that [facility] on [address] is still telling him that they do not have the refill for the [name]. Patient is requesting that we please resend medication to pharmacy.

## 2022-03-29 NOTE — Telephone Encounter (Signed)
RX now sent to pharmacy 

## 2022-04-05 ENCOUNTER — Encounter: Payer: HMO | Attending: Family Medicine | Admitting: Nutrition

## 2022-06-18 ENCOUNTER — Other Ambulatory Visit: Payer: Self-pay | Admitting: Endocrinology

## 2022-06-18 ENCOUNTER — Other Ambulatory Visit: Payer: Self-pay | Admitting: Family Medicine

## 2022-06-18 DIAGNOSIS — I10 Essential (primary) hypertension: Secondary | ICD-10-CM

## 2022-06-18 DIAGNOSIS — E1159 Type 2 diabetes mellitus with other circulatory complications: Secondary | ICD-10-CM

## 2022-06-18 DIAGNOSIS — I5032 Chronic diastolic (congestive) heart failure: Secondary | ICD-10-CM

## 2022-06-20 ENCOUNTER — Other Ambulatory Visit (INDEPENDENT_AMBULATORY_CARE_PROVIDER_SITE_OTHER): Payer: PPO

## 2022-06-20 DIAGNOSIS — Z794 Long term (current) use of insulin: Secondary | ICD-10-CM | POA: Diagnosis not present

## 2022-06-20 DIAGNOSIS — E1165 Type 2 diabetes mellitus with hyperglycemia: Secondary | ICD-10-CM | POA: Diagnosis not present

## 2022-06-20 LAB — BASIC METABOLIC PANEL
BUN: 17 mg/dL (ref 6–23)
CO2: 27 mEq/L (ref 19–32)
Calcium: 10 mg/dL (ref 8.4–10.5)
Chloride: 101 mEq/L (ref 96–112)
Creatinine, Ser: 1.12 mg/dL (ref 0.40–1.50)
GFR: 63.74 mL/min (ref >60.00)
Glucose, Bld: 231 mg/dL — ABNORMAL HIGH (ref 70–99)
Potassium: 4.2 mEq/L (ref 3.5–5.1)
Sodium: 137 mEq/L (ref 135–145)

## 2022-06-20 LAB — HEMOGLOBIN A1C: Hgb A1c MFr Bld: 8.8 % — ABNORMAL HIGH (ref 4.6–6.5)

## 2022-06-21 ENCOUNTER — Ambulatory Visit: Payer: HMO | Admitting: Endocrinology

## 2022-06-23 ENCOUNTER — Ambulatory Visit (INDEPENDENT_AMBULATORY_CARE_PROVIDER_SITE_OTHER): Payer: PPO | Admitting: Endocrinology

## 2022-06-23 ENCOUNTER — Encounter: Payer: Self-pay | Admitting: Endocrinology

## 2022-06-23 ENCOUNTER — Other Ambulatory Visit: Payer: Self-pay | Admitting: Family Medicine

## 2022-06-23 VITALS — BP 152/80 | HR 81 | Ht 72.0 in | Wt 213.0 lb

## 2022-06-23 DIAGNOSIS — Z794 Long term (current) use of insulin: Secondary | ICD-10-CM

## 2022-06-23 DIAGNOSIS — E871 Hypo-osmolality and hyponatremia: Secondary | ICD-10-CM | POA: Diagnosis not present

## 2022-06-23 DIAGNOSIS — M1A9XX0 Chronic gout, unspecified, without tophus (tophi): Secondary | ICD-10-CM

## 2022-06-23 DIAGNOSIS — E1129 Type 2 diabetes mellitus with other diabetic kidney complication: Secondary | ICD-10-CM

## 2022-06-23 DIAGNOSIS — Z23 Encounter for immunization: Secondary | ICD-10-CM | POA: Diagnosis not present

## 2022-06-23 DIAGNOSIS — R809 Proteinuria, unspecified: Secondary | ICD-10-CM

## 2022-06-23 DIAGNOSIS — E1165 Type 2 diabetes mellitus with hyperglycemia: Secondary | ICD-10-CM | POA: Diagnosis not present

## 2022-06-23 NOTE — Patient Instructions (Addendum)
Must take Novolog 20 UNITS BEFORE EACH MEAL  Metformin '1000MG'$  twice a day WITH food  Check blood sugars on waking up    Also check blood sugars about 2 hours after meals and do this after different meals by rotation  Recommended blood sugar levels on waking up are 90-130 and about 2 hours after meal is 130-180  Please bring your blood sugar monitor to each visit, thank you

## 2022-06-23 NOTE — Progress Notes (Signed)
Patient ID: Patrick Wilcox, male   DOB: 08-16-1945, 76 y.o.   MRN: [id number]    Reason for Appointment: Followup for Type 2 Diabetes   History of Present Illness:          Diagnosis: Type 2 diabetes mellitus, date of diagnosis: 2004       Past history:  At diagnosis he was having symptoms of drowsiness, difficulty breathing and blood sugar was reportedly over 600 He was initially treated with insulin but subsequently switched back to oral drugs He may have taken metformin and other medications for some time before going back on insulin His records indicate that he had been taking Amaryl and Januvia before going back on insulin in mid-2014 when his A1c had gone up over 13 Initially was given premixed insulin and then switched to Lantus and Humalog Record of his A1c indicates that his levels previously had been over 8% since 02/2012 He  had much better blood sugar control on his last visit with his changing his lifestyle and being instructed by the nurse educator  His Lantus did not appear to have 24 hour control with once a day injection  Recent history:   INSULIN regimen is described as:  36 units Tresiba daily in pm., Novolog 0-20 units after breakfast and dinner        Oral hypoglycemic drugs the patient is taking are: Metformin 1 g twice a day     His A1c is 8.8, previously 7.8   Glucose monitoring, current management and problems identified: He was instructed on how to use the freestyle libre sensor twice and apparently did try to use it in October He says that when he took off the sensor he noticed a rash and stopped using it without letting us know  Review of his sensor indicates that his blood sugars were consistently over 200 after meals likely when he was being alerted for high readings and this was frequently after dinner and sometimes after breakfast and lunch  He did not have any trouble getting the sensor from the pharmacy and insurance coverage also Despite  reminders he does not take his NovoLog consistently and mostly once a day Likely taking this only after eating in the evening  With this his fasting readings are quite variable but overall not significantly high  He says he is trying to do some walking However diet is still poor and still eating sweets and donuts  Weight has gone back up    DIET: Has seen the dietitian in 2014 and nurse educator in 8/18        Meals: 2 meals per day 9-10 am and 6 pm.   Breakfast is  eggs;  toast, eggs, has fruit for snacks.  Evening meal usually vegetables, tuna salad, chicken.       Compliance with the medical regimen:  Fair    Glucose monitoring: Checking less than 1 times a day        Glucometer:  One Touch Verio    Blood Glucose readings  from meter download   PRE-MEAL Fasting Lunch Dinner Bedtime Overall  Glucose range: 68-233      Mean/median: 144    144   POST-MEAL PC Breakfast PC Lunch PC Dinner  Glucose range:   ?  Mean/median:        PRE-MEAL Fasting Lunch Dinner Bedtime Overall  Glucose range: 64-165      Mean/median: 96    96   POST-MEAL PC Breakfast PC  Lunch PC Dinner  Glucose range:   ?  Mean/median:       Exercise: Walking regularly, 4-5/7 days a week  Weight history:  Wt Readings from Last 3 Encounters:  06/23/22 213 lb (96.6 kg)  03/02/22 209 lb (94.8 kg)  02/02/22 215 lb (97.5 kg)    Glycemic control:   Lab Results  Component Value Date   HGBA1C 8.8 (H) 06/20/2022   HGBA1C 7.8 (H) 02/28/2022   HGBA1C 8.7 (H) 11/25/2021   Lab Results  Component Value Date   MICROALBUR 68.8 (H) 11/25/2021   LDLCALC 48 11/10/2020   CREATININE 1.12 06/20/2022   Lab Results  Component Value Date   FRUCTOSAMINE 305 (H) 06/01/2020   FRUCTOSAMINE 296 (H) 12/31/2019   FRUCTOSAMINE 315 (H) 09/09/2019    Other active problems: See review of systems   Lab on 06/20/2022  Component Date Value Ref Range Status   Sodium 06/20/2022 137  135 - 145 mEq/L Final   Potassium  06/20/2022 4.2  3.5 - 5.1 mEq/L Final   Chloride 06/20/2022 101  96 - 112 mEq/L Final   CO2 06/20/2022 27  19 - 32 mEq/L Final   Glucose, Bld 06/20/2022 231 (H)  70 - 99 mg/dL Final   BUN 06/20/2022 17  6 - 23 mg/dL Final   Creatinine, Ser 06/20/2022 1.12  0.40 - 1.50 mg/dL Final   GFR 06/20/2022 63.74  >60.00 mL/min Final   Calculated using the CKD-EPI Creatinine Equation (2021)   Calcium 06/20/2022 10.0  8.4 - 10.5 mg/dL Final   Hgb A1c MFr Bld 06/20/2022 8.8 (H)  4.6 - 6.5 % Final   Glycemic Control Guidelines for People with Diabetes:Non Diabetic:  <6%Goal of Therapy: <7%Additional Action Suggested:  >8%       Allergies as of 06/23/2022   No Known Allergies      Medication List        Accurate as of June 23, 2022  9:29 AM. If you have any questions, ask your nurse or doctor.          allopurinol 300 MG tablet Commonly known as: ZYLOPRIM TAKE 1 TABLET BY MOUTH EVERY DAY   amLODipine 10 MG tablet Commonly known as: NORVASC TAKE 1 TABLET BY MOUTH EVERY DAY   aspirin 81 MG tablet Take 81 mg by mouth daily.   atorvastatin 40 MG tablet Commonly known as: LIPITOR TAKE 1 TABLET BY MOUTH EVERY DAY   B-D SINGLE USE SWABS REGULAR Pads Use as necessary   bacitracin 500 UNIT/GM ointment Apply 1 application topically 2 (two) times daily.   clobetasol ointment 0.05 % Commonly known as: TEMOVATE Apply 1 application topically 2 (two) times daily.   colchicine 0.6 MG tablet TAKE 1 TABLET (0.6 MG TOTAL) BY MOUTH DAILY AS NEEDED (FOR GOUT ATTACK.).   Easy Comfort Pen Needles 31G X 5 MM Misc Generic drug: Insulin Pen Needle Used to check  Blood glucose 3 times daily or prn.   Fluocinolone Acetonide Body 0.01 % Oil Apply 1 application. topically as directed.   FreeStyle Libre 2 Sensor Misc 2 Devices by Does not apply route every 14 (fourteen) days.   furosemide 20 MG tablet Commonly known as: LASIX TAKE 1 TABLET (20 MG TOTAL) BY MOUTH DAILY AS NEEDED FOR  EDEMA.   hydrochlorothiazide 12.5 MG capsule Commonly known as: MICROZIDE TAKE 1 CAPSULE BY MOUTH EVERY DAY   metFORMIN 1000 MG tablet Commonly known as: GLUCOPHAGE TAKE ONE TABLET BY MOUTH TWICE DAILY. TAKE WITH A  MEAL.   metoprolol tartrate 100 MG tablet Commonly known as: LOPRESSOR TAKE 1 TABLET BY MOUTH TWICE A DAY   NovoLOG FlexPen 100 UNIT/ML FlexPen Generic drug: insulin aspart INJECT 20-22 UNITS INTO SKIN PRIOR TO BREAKFAST AND 20-22 UNITS PRIOR TO EVENING MEAL   OneTouch Delica Lancets 82N Misc Use to check blood sugar twice daily. DX Code E11.9   OneTouch Verio IQ System w/Device Kit USE TO CHECK BLOOD SUGAR TWICE A DAY DX CODE E11.9   OneTouch Verio test strip Generic drug: glucose blood Use as instructed   ramipril 10 MG capsule Commonly known as: ALTACE TAKE 1 CAPSULE BY MOUTH EVERY DAY   sildenafil 50 MG tablet Commonly known as: VIAGRA Take 1 tablet (50 mg total) by mouth daily as needed for erectile dysfunction.   spironolactone 25 MG tablet Commonly known as: ALDACTONE Take 1 tablet (25 mg total) by mouth daily. For blood pressure.   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 He has  never used smokeless tobacco. He reports that he does not drink alcohol and does not use drugs.    Review of Systems        Lipids:  LDL has been treated to goal with Lipitor 40 mg Has history of CAD, followed by PCP HDL has been low       Lab Results  Component Value Date   CHOL 102 11/10/2020   HDL 27.70 (L) 11/10/2020   LDLCALC 48 11/10/2020   LDLDIRECT 71.0 02/28/2022   TRIG 129.0 11/10/2020   CHOLHDL 4 11/10/2020       Is high blood pressure has been treated with Norvasc 10 mg, ramipril 10 mg, HCTZ 12.5, Aldactone 25 mg and metoprolol, Followed by PCP.    He is on Lasix from PCP     BP Readings from Last 3 Encounters:  06/23/22 (!) 152/80  03/02/22 128/68  11/30/21 (!) 142/72   Lab Results  Component Value Date   K 4.2 00/37/0488   Complications: Microalbuminuria Microalbumin history:  Lab Results  Component Value Date   MICRALBCREAT 39.9 (H) 11/25/2021   MICRALBCREAT 39.9 (H) 02/11/2021   MICRALBCREAT 55.6 (H) 07/30/2020   MICRALBCREAT 98.1 (H) 09/09/2019       Diabetic foot exam was done in 11/22 by his PCP  Last exam with eye doctor  was 4/20 and has not made an appointment despite reminders   LABS:  Lab on 06/20/2022  Component Date Value Ref Range Status   Sodium 06/20/2022 137  135 - 145 mEq/L Final   Potassium 06/20/2022 4.2  3.5 - 5.1 mEq/L Final  Chloride 06/20/2022 101  96 - 112 mEq/L Final   CO2 06/20/2022 27  19 - 32 mEq/L Final   Glucose, Bld 06/20/2022 231 (H)  70 - 99 mg/dL Final   BUN 06/20/2022 17  6 - 23 mg/dL Final   Creatinine, Ser 06/20/2022 1.12  0.40 - 1.50 mg/dL Final   GFR 06/20/2022 63.74  >60.00 mL/min Final   Calculated using the CKD-EPI Creatinine Equation (2021)   Calcium 06/20/2022 10.0  8.4 - 10.5 mg/dL Final   Hgb A1c MFr Bld 06/20/2022 8.8 (H)  4.6 - 6.5 % Final   Glycemic Control Guidelines for People with Diabetes:Non Diabetic:  <6%Goal of Therapy: <7%Additional Action Suggested:  >8%     Physical  Examination:  BP (!) 152/80   Pulse 81   Ht 6' (1.829 m)   Wt 213 lb (96.6 kg)   SpO2 99%   BMI 28.89 kg/m     ASSESSMENT/PLAN  Diabetes type 2, On insulin   See history of present illness for detailed discussion of current management, problems identified and blood sugar patterns.  A1c is now back up to 8.8  He has been on basal bolus insulin and Metformin  With the freestyle libre sensor he appeared to have frequently high readings postprandially Discussed that this is from lack of Premeal insulin before eating Discussed differences between basal and bolus insulin Again reminded him that NovoLog will not cause low sugars if he takes the dose before meals And also needs to assess consistently what his readings are after meals, currently checking only fasting fingersticks  Discussed that he is gaining weight and with A1c going up with poor diet he needs to improve lifestyle  HYPONATREMIA: Likely to be from HCTZ as before   Plan:  Tresiba 36 units as before   Novolog must be taken consistently before breakfast and dinner  Start using freestyle libre again and restart checking blood sugar 4 times a day He will be instructed by the nurse educator again If he starts having a rash will switch him to [facility] and may provide him the reader as a sample  Also needs review of his insulin regimen and diet More consistent regular walking for exercise  There are no Patient Instructions on file for this visit.       [name] 06/23/2022, 9:29 AM   Note: This office note was prepared with Dragon voice recognition system technology. Any transcriptional errors that result from this process are unintentional.

## 2022-07-29 ENCOUNTER — Other Ambulatory Visit: Payer: Self-pay | Admitting: Endocrinology

## 2022-07-29 ENCOUNTER — Other Ambulatory Visit: Payer: Self-pay | Admitting: Family Medicine

## 2022-07-29 DIAGNOSIS — I10 Essential (primary) hypertension: Secondary | ICD-10-CM

## 2022-07-29 DIAGNOSIS — E1169 Type 2 diabetes mellitus with other specified complication: Secondary | ICD-10-CM

## 2022-08-10 ENCOUNTER — Telehealth: Payer: Self-pay | Admitting: Endocrinology

## 2022-08-10 ENCOUNTER — Telehealth: Payer: Self-pay | Admitting: Family Medicine

## 2022-08-10 DIAGNOSIS — E1169 Type 2 diabetes mellitus with other specified complication: Secondary | ICD-10-CM

## 2022-08-10 DIAGNOSIS — M1A9XX0 Chronic gout, unspecified, without tophus (tophi): Secondary | ICD-10-CM

## 2022-08-10 DIAGNOSIS — I10 Essential (primary) hypertension: Secondary | ICD-10-CM

## 2022-08-10 MED ORDER — TRESIBA FLEXTOUCH 100 UNIT/ML ~~LOC~~ SOPN: 38.0000 [IU] | PEN_INJECTOR | Freq: Every day | SUBCUTANEOUS | 1 refills | Status: DC

## 2022-08-10 MED ORDER — NOVOLOG FLEXPEN 100 UNIT/ML ~~LOC~~ SOPN: 20.0000 [IU] | PEN_INJECTOR | Freq: Two times a day (BID) | SUBCUTANEOUS | 1 refills | Status: DC

## 2022-08-10 MED ORDER — ATORVASTATIN CALCIUM 40 MG PO TABS: 40.0000 mg | ORAL_TABLET | Freq: Every day | ORAL | 1 refills | Status: DC

## 2022-08-10 MED ORDER — FREESTYLE LIBRE 2 SENSOR MISC: 2.0000 | 3 refills | Status: DC

## 2022-08-10 MED ORDER — METFORMIN HCL 1000 MG PO TABS: ORAL_TABLET | ORAL | 1 refills | Status: DC

## 2022-08-10 MED ORDER — SPIRONOLACTONE 25 MG PO TABS: ORAL_TABLET | ORAL | 1 refills | Status: DC

## 2022-08-10 MED ORDER — ONETOUCH VERIO VI STRP: ORAL_STRIP | 1 refills | Status: DC

## 2022-08-10 MED ORDER — COLCHICINE 0.6 MG PO TABS: 0.6000 mg | ORAL_TABLET | Freq: Every day | ORAL | 1 refills | Status: DC | PRN

## 2022-08-10 NOTE — Telephone Encounter (Signed)
Rxs sent

## 2022-08-10 NOTE — Telephone Encounter (Signed)
Patient came in to office today saying that he wanted a 90 day prescription sent in for all medications prescribed by Dr. Dwyane Dee.  Patient states that he is out of all medications so wants these sent in as soon as possible.  Patient stated that medications were in the computer and he could not list what they all were.  [facility] uses [id number] [place], [place] [facility] [address], [id number]

## 2022-08-10 NOTE — Telephone Encounter (Signed)
Prescription Request  08/10/2022  Is this a "Controlled Substance" medicine? No  LOV: 10/18/2021  What is the name of the medication or equipment?  spironolactone (ALDACTONE) 25 MG tablet atorvastatin (LIPITOR) 40 MG tablet colchicine 0.6 MG tablet glucose blood (ONETOUCH VERIO) test strip Insulin Pen Needle (B-D UF III MINI PEN NEEDLES) 31G X 5 MM MISC  Have you contacted your pharmacy to request a refill? No   Which pharmacy would you like this sent to?  [id number] [place], [place] - [address] [id number]: [phone]: [phone] Pt would like Rx to be a 90 day supply.   Patient notified that their request is being sent to the clinical staff for review and that they should receive a response within 3 business days.   Please advise at Mobile [phone](mobile)

## 2022-08-10 NOTE — Telephone Encounter (Signed)
Rx sent to pharmacy. For insulin and sensors.

## 2022-09-09 NOTE — Progress Notes (Unsigned)
HPI: [name] is a 77 y.o. male, who is here today for chronic disease management.  Last seen on 10/18/21. Since his last visit he has seen his endocrinologist. Lab Results  Component Value Date   HGBA1C 8.8 (H) 06/20/2022   Lab Results  Component Value Date   MICROALBUR 68.8 (H) 11/25/2021   MICROALBUR 93.4 (H) 02/11/2021   Hypertension:  Medications:*** BP readings at home:*** Side effects:***  Negative for unusual or severe headache, visual changes, exertional chest pain, dyspnea,  focal weakness, or edema.  Lab Results  Component Value Date   CREATININE 1.12 06/20/2022   BUN 17 06/20/2022   NA 137 06/20/2022   K 4.2 06/20/2022   CL 101 06/20/2022   CO2 27 06/20/2022    Lab Results  Component Value Date   CHOL 102 11/10/2020   HDL 27.70 (L) 11/10/2020   LDLCALC 48 11/10/2020   LDLDIRECT 71.0 02/28/2022   TRIG 129.0 11/10/2020   CHOLHDL 4 11/10/2020   *** Review of Systems See other pertinent positives and negatives in HPI.  Current Outpatient Medications on File Prior to Visit  Medication Sig Dispense Refill   Alcohol Swabs (B-D SINGLE USE SWABS REGULAR) PADS Use as necessary 300 each 6   allopurinol (ZYLOPRIM) 300 MG tablet TAKE 1 TABLET BY MOUTH EVERY DAY 90 tablet 2   aspirin 81 MG tablet Take 81 mg by mouth daily.     atorvastatin (LIPITOR) 40 MG tablet Take 1 tablet (40 mg total) by mouth daily. 90 tablet 1   bacitracin 500 UNIT/GM ointment Apply 1 application topically 2 (two) times daily. 15 g 0   Blood Glucose Monitoring Suppl (ONETOUCH VERIO IQ SYSTEM) w/Device KIT USE TO CHECK BLOOD SUGAR TWICE A DAY DX CODE E11.9 1 kit 2   clobetasol ointment (TEMOVATE) [id number] % Apply 1 application topically 2 (two) times daily. 30 g 0   Continuous Blood Gluc Sensor (FREESTYLE LIBRE 2 SENSOR) MISC 2 Devices by Does not apply route every 14 (fourteen) days. 6 each 3   Fluocinolone Acetonide Body 0.01 % OIL Apply 1 application. topically as directed.      furosemide (LASIX) 20 MG tablet TAKE 1 TABLET (20 MG TOTAL) BY MOUTH DAILY AS NEEDED FOR EDEMA. 90 tablet 2   glucose blood (ONETOUCH VERIO) test strip Use to check blood sugars 1-2 times daily, Dx: E11.65 200 strip 1   insulin aspart (NOVOLOG FLEXPEN) 100 UNIT/ML FlexPen Inject 20-22 Units into the skin 2 (two) times daily before a meal. E11.69 45 mL 1   insulin degludec (TRESIBA FLEXTOUCH) 100 UNIT/ML FlexTouch Pen Inject 38 Units into the skin daily. E11.69 45 mL 1   Insulin Pen Needle (B-D UF III MINI PEN NEEDLES) 31G X 5 MM MISC USE ONCE DAILY 100 each 3   metFORMIN (GLUCOPHAGE) 1000 MG tablet TAKE ONE TABLET BY MOUTH TWICE DAILY. TAKE WITH A MEAL. 180 tablet 1   OneTouch Delica Lancets 99991111 MISC Use to check blood sugar twice daily. DX Code E11.9 200 each 1   ramipril (ALTACE) 10 MG capsule TAKE 1 CAPSULE BY MOUTH EVERY DAY 90 capsule 2   sildenafil (VIAGRA) 50 MG tablet Take 1 tablet (50 mg total) by mouth daily as needed for erectile dysfunction. 90 tablet 0   spironolactone (ALDACTONE) 25 MG tablet TAKE 1 TABLET BY MOUTH EVERY DAY FOR BLOOD PRESSURE 90 tablet 1   No current facility-administered medications on file prior to visit.    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   PNEUMONIA, LEFT LOWER LOBE 09/08/2009   Rash    No Known Allergies  Social History   Socioeconomic History   Marital status: Widowed    Spouse name: Not on file   Number of children: 2   Years of education: Not on file   Highest education level: Not on file  Occupational History    Comment: Retired from [employer] Use   Smoking status: Never   Smokeless tobacco: Never  Vaping Use   Vaping Use: Never used  Substance and Sexual Activity   Alcohol use: No     Comment: stopped drinking etoh completely in 1999-09-22   Drug use: No   Sexual activity: Yes    Comment: uses viagra  Other Topics Concern   Not on file  Social History Narrative   Lives alone   Retired from [employer] in 09-22-1999   Twin sons, one son died of a stroke this year 2018/09/22) at age 80, his living son resides in [place]    His wife died of breast cancer 11 years ago    Social Determinants of Health   Financial Resource Strain: Low Risk  (02/02/2022)   Overall Financial Resource Strain (CARDIA)    Difficulty of Paying Living Expenses: Not hard at all  Food Insecurity: No [place] (02/02/2022)   Hunger Vital Sign    Worried About Running Out of Food in the Last Year: Never true    [place] in the Last Year: Never true  Transportation Needs: No Transportation Needs (02/02/2022)   PRAPARE - Hydrologist (Medical): No    Lack of Transportation (Non-Medical): No  Physical Activity: Sufficiently Active (02/02/2022)   Exercise Vital Sign    Days of Exercise per Week: 7 days    Minutes of Exercise per Session: 150+ min  Stress: No Stress Concern Present (02/02/2022)   [place]    Feeling of Stress : Not at all  Social Connections: Moderately Integrated (02/02/2022)   Social Connection and Isolation Panel [NHANES]    Frequency of Communication with Friends and Family: More than three times a week    Frequency of Social Gatherings with Friends and Family: More than three times a week    Attends Religious Services: More than 4 times per year    Active Member of [employer] or Organizations: Yes    Attends Archivist Meetings: More than 4 times per year    Marital Status: Never married    Vitals:   09/12/22 0914  BP: 110/70  Pulse: 86  Resp: 16  Temp: 98.4 F (36.9 C)  SpO2: 98%   Body mass index is 28.24 kg/m.  Physical Exam Nursing note reviewed.   Constitutional:      General: He is not in acute distress.    Appearance: He is well-developed.  HENT:     Head: Normocephalic and atraumatic.     Mouth/Throat:     Mouth: Mucous membranes are moist.  Eyes:     Conjunctiva/sclera: Conjunctivae normal.  Cardiovascular:     Rate and Rhythm: Normal rate. Rhythm irregular. Occasional Extrasystoles are present.    Heart  sounds: Murmur (SEM I/VI RUSB) heard.  Pulmonary:     Effort: Pulmonary effort is normal. No respiratory distress.     Breath sounds: Normal breath sounds.  Abdominal:     Palpations: Abdomen is soft. There is no hepatomegaly or mass.     Tenderness: There is no abdominal tenderness.  Lymphadenopathy:     Cervical: No cervical adenopathy.  Skin:    General: Skin is warm.     Findings: No erythema or rash.  Neurological:     Mental Status: He is alert and oriented to person, place, and time.     Cranial Nerves: No cranial nerve deficit.     Gait: Gait normal.  Psychiatric:     Comments: Well groomed, good eye contact.    ASSESSMENT AND PLAN:  Patrick Wilcox was seen today for medical management of chronic issues.  Diagnoses and all orders for this visit:  Essential hypertension -     TSH; Future -     hydrochlorothiazide (MICROZIDE) 12.5 MG capsule; Take 1 capsule (12.5 mg total) by mouth daily.  Pure hypercholesterolemia -     Hepatic function panel; Future -     Lipid panel; Future  Chronic heart failure with preserved ejection fraction (HCC)  Microalbuminuria due to type 2 diabetes mellitus ([place])  Resistant hypertension  Chronic gout without tophus, unspecified cause, unspecified site -     colchicine 0.6 MG tablet; Take 1 tablet (0.6 mg total) by mouth daily as needed (for gout attack.).  Tingling of left upper extremity -     TSH; Future -     Vitamin B12; Future  Other orders -     amLODipine (NORVASC) 10 MG tablet; Take 1 tablet (10 mg total) by mouth daily. -     metoprolol tartrate (LOPRESSOR)  100 MG tablet; Take 1 tablet (100 mg total) by mouth 2 (two) times daily.    Orders Placed This Encounter  Procedures   Hepatic function panel   Lipid panel   TSH   Vitamin B12    Resistant hypertension BP adequately controlled. Continue current management: Ramipril 10 mg daily, HCTZ 12.5 mg,spironolactone 25 mg daily, Metoprolol Tartrate 100 mg bid, and Amlodipine 10 mg daily. DASH/low salt diet to continue. Continue monitoring BP at home.  (HFpEF) heart failure with preserved ejection fraction (HCC) Asymptomatic. Continue Metoprolol tartrate, Ramipril,and furosemide same dose. Low salt diet.  Pure hypercholesterolemia Lipid panel order added today, he is having blood work schedule at Dr Phineas Inches office Monday. Continue Atorvastatin 40 mg daily and low fat diet.   Return in about 6 months (around 03/15/2023) for chronic problems.  Patrick Royce G. [name], MD  [facility]. [place] office.

## 2022-09-12 ENCOUNTER — Ambulatory Visit (INDEPENDENT_AMBULATORY_CARE_PROVIDER_SITE_OTHER): Payer: PPO | Admitting: Family Medicine

## 2022-09-12 ENCOUNTER — Encounter: Payer: Self-pay | Admitting: Family Medicine

## 2022-09-12 VITALS — BP 110/70 | HR 86 | Temp 98.4°F | Resp 16 | Ht 72.0 in | Wt 208.2 lb

## 2022-09-12 DIAGNOSIS — R809 Proteinuria, unspecified: Secondary | ICD-10-CM

## 2022-09-12 DIAGNOSIS — R202 Paresthesia of skin: Secondary | ICD-10-CM

## 2022-09-12 DIAGNOSIS — M1A9XX0 Chronic gout, unspecified, without tophus (tophi): Secondary | ICD-10-CM | POA: Diagnosis not present

## 2022-09-12 DIAGNOSIS — I1A0 Resistant hypertension: Secondary | ICD-10-CM

## 2022-09-12 DIAGNOSIS — I5032 Chronic diastolic (congestive) heart failure: Secondary | ICD-10-CM

## 2022-09-12 DIAGNOSIS — E1129 Type 2 diabetes mellitus with other diabetic kidney complication: Secondary | ICD-10-CM

## 2022-09-12 DIAGNOSIS — I10 Essential (primary) hypertension: Secondary | ICD-10-CM

## 2022-09-12 DIAGNOSIS — E7800 Pure hypercholesterolemia, unspecified: Secondary | ICD-10-CM | POA: Diagnosis not present

## 2022-09-12 MED ORDER — HYDROCHLOROTHIAZIDE 12.5 MG PO CAPS: 12.5000 mg | ORAL_CAPSULE | Freq: Every day | ORAL | 2 refills | Status: DC

## 2022-09-12 MED ORDER — METOPROLOL TARTRATE 100 MG PO TABS: 100.0000 mg | ORAL_TABLET | Freq: Two times a day (BID) | ORAL | 2 refills | Status: DC

## 2022-09-12 MED ORDER — COLCHICINE 0.6 MG PO TABS: 0.6000 mg | ORAL_TABLET | Freq: Every day | ORAL | 1 refills | Status: DC | PRN

## 2022-09-12 MED ORDER — AMLODIPINE BESYLATE 10 MG PO TABS: 10.0000 mg | ORAL_TABLET | Freq: Every day | ORAL | 3 refills | Status: DC

## 2022-09-12 NOTE — Patient Instructions (Addendum)
A few things to remember from today's visit:  Essential hypertension - Plan: TSH, hydrochlorothiazide (MICROZIDE) 12.5 MG capsule  Pure hypercholesterolemia - Plan: Hepatic function panel, Lipid panel  Chronic heart failure with preserved ejection fraction (HCC), Chronic  Microalbuminuria due to type 2 diabetes mellitus ([place]), Chronic  Resistant hypertension  Chronic gout without tophus, unspecified cause, unspecified site - Plan: colchicine 0.6 MG tablet  Tingling of left upper extremity - Plan: TSH, Vitamin B12 Continue monitoring for new symptoms, tingling does not seem concerning at this time. I added labs for you to have at your endocrinologist's office.  If you need refills for medications you take chronically, please call your pharmacy. Do not use My Chart to request refills or for acute issues that need immediate attention. If you send a my chart message, it may take a few days to be addressed, specially if I am not in the office.  Please be sure medication list is accurate. If a new problem present, please set up appointment sooner than planned today.

## 2022-09-12 NOTE — Assessment & Plan Note (Signed)
Lipid panel order added today, he is having blood work schedule at Dr Phineas Inches office Monday. Continue Atorvastatin 40 mg daily and low fat diet.

## 2022-09-12 NOTE — Assessment & Plan Note (Signed)
BP adequately controlled. Continue current management: Ramipril 10 mg daily, HCTZ 12.5 mg,spironolactone 25 mg daily, Metoprolol Tartrate 100 mg bid, and Amlodipine 10 mg daily. DASH/low salt diet to continue. Continue monitoring BP at home.

## 2022-09-12 NOTE — Assessment & Plan Note (Signed)
Last echo in 07/2021 was abnormal, LVEF 40-45% (60-65%). He is asymptomatic. Continue Metoprolol tartrate, Ramipril,and furosemide same dose. He is not on a SGLT2 inhibitor. Low salt diet. He needs to arrange follow up with cardiologist.

## 2022-09-15 NOTE — Assessment & Plan Note (Signed)
He has been asymptomatic. He is not taking Allopurinol. Continue Colchicine 0.6 mg daily as needed. Continue low purine diet.

## 2022-09-15 NOTE — Assessment & Plan Note (Signed)
Possible etiologies discussed  No associated CP or diaphoresis. Instructed about warning signs. Place future labs to be added to next blood work.

## 2022-09-15 NOTE — Assessment & Plan Note (Signed)
Following with endocrinologist. Continue Ramipril 10 mg daily. Stressed the importance of improving glucose control.

## 2022-09-19 ENCOUNTER — Other Ambulatory Visit (INDEPENDENT_AMBULATORY_CARE_PROVIDER_SITE_OTHER): Payer: PPO

## 2022-09-19 DIAGNOSIS — R809 Proteinuria, unspecified: Secondary | ICD-10-CM

## 2022-09-19 DIAGNOSIS — E1129 Type 2 diabetes mellitus with other diabetic kidney complication: Secondary | ICD-10-CM

## 2022-09-19 DIAGNOSIS — E1165 Type 2 diabetes mellitus with hyperglycemia: Secondary | ICD-10-CM | POA: Diagnosis not present

## 2022-09-19 DIAGNOSIS — Z794 Long term (current) use of insulin: Secondary | ICD-10-CM

## 2022-09-19 LAB — BASIC METABOLIC PANEL
BUN: 13 mg/dL (ref 6–23)
CO2: 28 mEq/L (ref 19–32)
Calcium: 9.8 mg/dL (ref 8.4–10.5)
Chloride: 95 mEq/L — ABNORMAL LOW (ref 96–112)
Creatinine, Ser: 1.1 mg/dL (ref 0.40–1.50)
GFR: 65.02 mL/min (ref >60.00)
Glucose, Bld: 288 mg/dL — ABNORMAL HIGH (ref 70–99)
Potassium: 4.3 mEq/L (ref 3.5–5.1)
Sodium: 132 mEq/L — ABNORMAL LOW (ref 135–145)

## 2022-09-19 LAB — MICROALBUMIN / CREATININE URINE RATIO
Creatinine,U: 161.8 mg/dL
Microalb Creat Ratio: 7 mg/g (ref 0.0–30.0)
Microalb, Ur: 11.3 mg/dL — ABNORMAL HIGH (ref 0.0–1.9)

## 2022-09-19 LAB — HEMOGLOBIN A1C: Hgb A1c MFr Bld: 9.4 % — ABNORMAL HIGH (ref 4.6–6.5)

## 2022-09-20 ENCOUNTER — Other Ambulatory Visit: Payer: Self-pay | Admitting: Family Medicine

## 2022-09-20 DIAGNOSIS — E1169 Type 2 diabetes mellitus with other specified complication: Secondary | ICD-10-CM

## 2022-09-22 ENCOUNTER — Ambulatory Visit (INDEPENDENT_AMBULATORY_CARE_PROVIDER_SITE_OTHER): Payer: PPO | Admitting: Endocrinology

## 2022-09-22 ENCOUNTER — Encounter: Payer: Self-pay | Admitting: Endocrinology

## 2022-09-22 VITALS — BP 152/82 | HR 71 | Ht 72.0 in | Wt 211.0 lb

## 2022-09-22 DIAGNOSIS — E871 Hypo-osmolality and hyponatremia: Secondary | ICD-10-CM

## 2022-09-22 DIAGNOSIS — Z794 Long term (current) use of insulin: Secondary | ICD-10-CM | POA: Diagnosis not present

## 2022-09-22 DIAGNOSIS — R809 Proteinuria, unspecified: Secondary | ICD-10-CM

## 2022-09-22 DIAGNOSIS — E1129 Type 2 diabetes mellitus with other diabetic kidney complication: Secondary | ICD-10-CM

## 2022-09-22 DIAGNOSIS — E1165 Type 2 diabetes mellitus with hyperglycemia: Secondary | ICD-10-CM

## 2022-09-22 NOTE — Progress Notes (Signed)
Patient ID: Patrick Wilcox, male   DOB: [birth year], 77 y.o.   MRN: [name]    Reason for Appointment: Followup for Type 2 Diabetes   History of Present Illness:          Diagnosis: Type 2 diabetes mellitus, date of diagnosis: 2004       Past history:  At diagnosis he was having symptoms of drowsiness, difficulty breathing and blood sugar was reportedly over 600 He was initially treated with insulin but subsequently switched back to oral drugs He may have taken metformin and other medications for some time before going back on insulin His records indicate that he had been taking Amaryl and Januvia before going back on insulin in mid-2014 when his A1c had gone up over 13 Initially was given premixed insulin and then switched to Lantus and Humalog Record of his A1c indicates that his levels previously had been over 8% since 02/2012 He  had much better blood sugar control on his last visit with his changing his lifestyle and being instructed by the nurse educator  His Lantus did not appear to have 24 hour control with once a day injection  Recent history:   INSULIN regimen is described as:  36 units Tresiba daily in pm., Novolog 0-20 units after breakfast and dinner        Oral hypoglycemic drugs the patient is taking are: Metformin 1 g twice a day     His A1c is 9.4, was 8.8  Glucose monitoring, current management and problems identified: He was again not able to get started on the freestyle libre sensor and has difficulty getting to know how to use it or getting the supplies As before he forgets to check his readings after meals and only in the morning Although he thinks he is taking his [name] regularly his still not taking his NovoLog at dinnertime In the morning he takes his NovoLog after eating instead of before eating as prescribed Again he is still eating donuts usually in the morning and his glucose after breakfast in the lab was 288 This is despite his recent  fasting blood sugars averaging 118 No hypoglycemia also He has not started back on walking as yet His weight is slightly lower    DIET: Has seen the dietitian in 2014 and nurse educator in 8/18        Meals: 2 meals per day 9-10 am and 6 pm.   Breakfast is  eggs;  toast, eggs, has fruit for snacks.  Evening meal usually vegetables, tuna salad, chicken.       Compliance with the medical regimen:  Fair    Glucose monitoring: Checking less than 1 times a day        Glucometer:  One Touch Verio    Blood Glucose readings  from meter download  AVERAGE 118 with 14-day range 89-141  Previously:  PRE-MEAL Fasting Lunch Dinner Bedtime Overall  Glucose range: 68-233      Mean/median: 144    144   POST-MEAL PC Breakfast PC Lunch PC Dinner  Glucose range:   ?  Mean/median:        Exercise: Walking regularly, 4-5/7 days a week  Weight history:  Wt Readings from Last 3 Encounters:  09/22/22 211 lb (95.7 kg)  09/12/22 208 lb 4 oz (94.5 kg)  06/23/22 213 lb (96.6 kg)    Glycemic control:   Lab Results  Component Value Date   HGBA1C 9.4 (H) 09/19/2022   HGBA1C  8.8 (H) 06/20/2022   HGBA1C 7.8 (H) 02/28/2022   Lab Results  Component Value Date   MICROALBUR 11.3 (H) 09/19/2022   LDLCALC 48 11/10/2020   CREATININE 1.10 09/19/2022   Lab Results  Component Value Date   FRUCTOSAMINE 305 (H) 06/01/2020   FRUCTOSAMINE 296 (H) 12/31/2019   FRUCTOSAMINE 315 (H) 09/09/2019    Other active problems: See review of systems   Lab on 09/19/2022  Component Date Value Ref Range Status   Sodium 09/19/2022 132 (L)  135 - 145 mEq/L Final   Potassium 09/19/2022 4.3  3.5 - 5.1 mEq/L Final   Chloride 09/19/2022 95 (L)  96 - 112 mEq/L Final   CO2 09/19/2022 28  19 - 32 mEq/L Final   Glucose, Bld 09/19/2022 288 (H)  70 - 99 mg/dL Final   BUN 09/19/2022 13  6 - 23 mg/dL Final   Creatinine, Ser 09/19/2022 1.10  0.40 - 1.50 mg/dL Final   GFR 09/19/2022 65.02  >60.00 mL/min Final    Calculated using the CKD-EPI Creatinine Equation (2021)   Calcium 09/19/2022 9.8  8.4 - 10.5 mg/dL Final   Hgb A1c MFr Bld 09/19/2022 9.4 (H)  4.6 - 6.5 % Final   Glycemic Control Guidelines for People with Diabetes:Non Diabetic:  <6%Goal of Therapy: <7%Additional Action Suggested:  >8%    Microalb, Ur 09/19/2022 11.3 (H)  0.0 - 1.9 mg/dL Final   Creatinine,U 09/19/2022 161.8  mg/dL Final   Microalb Creat Ratio 09/19/2022 7.0  0.0 - 30.0 mg/g Final      Allergies as of 09/22/2022   No Known Allergies      Medication List        Accurate as of September 22, 2022 10:23 AM. If you have any questions, ask your nurse or doctor.          amLODipine 10 MG tablet Commonly known as: NORVASC Take 1 tablet (10 mg total) by mouth daily.   aspirin 81 MG tablet Take 81 mg by mouth daily.   atorvastatin 40 MG tablet Commonly known as: LIPITOR Take 1 tablet (40 mg total) by mouth daily.   B-D SINGLE USE SWABS REGULAR Pads Use as necessary   B-D UF III MINI PEN NEEDLES 31G X 5 MM Misc Generic drug: Insulin Pen Needle USE ONCE DAILY   bacitracin 500 UNIT/GM ointment Apply 1 application topically 2 (two) times daily.   clobetasol ointment 0.05 % Commonly known as: TEMOVATE Apply 1 application topically 2 (two) times daily.   colchicine 0.6 MG tablet Take 1 tablet (0.6 mg total) by mouth daily as needed (for gout attack.).   Fluocinolone Acetonide Body 0.01 % Oil Apply 1 application. topically as directed.   FreeStyle Libre 2 Sensor Misc 2 Devices by Does not apply route every 14 (fourteen) days.   furosemide 20 MG tablet Commonly known as: LASIX TAKE 1 TABLET (20 MG TOTAL) BY MOUTH DAILY AS NEEDED FOR EDEMA.   hydrochlorothiazide 12.5 MG capsule Commonly known as: MICROZIDE Take 1 capsule (12.5 mg total) by mouth daily.   metFORMIN 1000 MG tablet Commonly known as: GLUCOPHAGE TAKE ONE TABLET BY MOUTH TWICE DAILY. TAKE WITH A MEAL.   metoprolol tartrate 100 MG  tablet Commonly known as: LOPRESSOR Take 1 tablet (100 mg total) by mouth 2 (two) times daily.   NovoLOG FlexPen 100 UNIT/ML FlexPen Generic drug: insulin aspart Inject 20-22 Units into the skin 2 (two) times daily before a meal. [id number]   OneTouch Delica Lancets 99991111 Misc  Use to check blood sugar twice daily. DX Code E11.9   OneTouch Verio IQ System w/Device Kit USE TO CHECK BLOOD SUGAR TWICE A DAY DX CODE E11.9   OneTouch Verio test strip Generic drug: glucose blood USE AS INSTRUCTED   ramipril 10 MG capsule Commonly known as: ALTACE TAKE 1 CAPSULE BY MOUTH EVERY DAY   sildenafil 50 MG tablet Commonly known as: VIAGRA Take 1 tablet (50 mg total) by mouth daily as needed for erectile dysfunction.   spironolactone 25 MG tablet Commonly known as: ALDACTONE TAKE 1 TABLET BY MOUTH EVERY DAY FOR BLOOD PRESSURE   Tresiba FlexTouch 100 UNIT/ML FlexTouch Pen Generic drug: insulin degludec Inject 38 Units into the skin daily. 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 He has never used smokeless tobacco. He reports that he does not drink alcohol and does not use drugs.    Review of Systems        Lipids:  LDL has been treated to goal with Lipitor 40 mg Has history of CAD, followed by PCP HDL has been low       Lab  Results  Component Value Date   CHOL 102 11/10/2020   HDL 27.70 (L) 11/10/2020   LDLCALC 48 11/10/2020   LDLDIRECT 71.0 02/28/2022   TRIG 129.0 11/10/2020   CHOLHDL 4 11/10/2020       Is high blood pressure has been treated with Norvasc 10 mg, ramipril 10 mg, HCTZ 12.5, Aldactone 25 mg and metoprolol, Followed by PCP.    He is on Lasix from PCP     BP Readings from Last 3 Encounters:  09/22/22 (!) 152/82  09/12/22 110/70  06/23/22 (!) 152/80   Lab Results  Component Value Date   K 4.3 0000000   Complications: Microalbuminuria Microalbumin history:  Lab Results  Component Value Date   MICRALBCREAT 7.0 09/19/2022   MICRALBCREAT 39.9 (H) 11/25/2021   MICRALBCREAT 39.9 (H) 02/11/2021   MICRALBCREAT 55.6 (H) 07/30/2020     Diabetic foot exam was done in 09/2022 No symptoms of neuropathy  Last exam with eye doctor  was 4/20 and has not made an appointment despite reminders   LABS:  Lab on 09/19/2022  Component Date Value Ref Range Status   Sodium 09/19/2022 132 (L)  135 - 145 mEq/L Final   Potassium 09/19/2022 4.3  3.5 - 5.1 mEq/L Final   Chloride 09/19/2022 95 (L)  96 - 112 mEq/L Final   CO2 09/19/2022 28  19 - 32 mEq/L Final   Glucose, Bld 09/19/2022 288 (H)  70 - 99 mg/dL Final   BUN 09/19/2022 13  6 - 23 mg/dL Final   Creatinine, Ser 09/19/2022 1.10  0.40 -  1.50 mg/dL Final   GFR 09/19/2022 65.02  >60.00 mL/min Final   Calculated using the CKD-EPI Creatinine Equation (2021)   Calcium 09/19/2022 9.8  8.4 - 10.5 mg/dL Final   Hgb A1c MFr Bld 09/19/2022 9.4 (H)  4.6 - 6.5 % Final   Glycemic Control Guidelines for People with Diabetes:Non Diabetic:  <6%Goal of Therapy: <7%Additional Action Suggested:  >8%    Microalb, Ur 09/19/2022 11.3 (H)  0.0 - 1.9 mg/dL Final   Creatinine,U 09/19/2022 161.8  mg/dL Final   Microalb Creat Ratio 09/19/2022 7.0  0.0 - 30.0 mg/g Final    Physical Examination:  BP (!) 152/82 (BP Location: Left Arm, Patient Position: Sitting,  Cuff Size: Normal)   Pulse 71   Ht 6' (1.829 m)   Wt 211 lb (95.7 kg)   SpO2 97%   BMI 28.62 kg/m   Diabetic Foot Exam - Simple   Simple Foot Form Diabetic Foot exam was performed with the following findings: Yes   Visual Inspection No deformities, no ulcerations, no other skin breakdown bilaterally: Yes Sensation Testing Intact to touch and monofilament testing bilaterally: Yes Pulse Check Posterior Tibialis and Dorsalis pulse intact bilaterally: Yes Comments      ASSESSMENT/PLAN  Diabetes type 2, On insulin   See history of present illness for detailed discussion of current management, problems identified and blood sugar patterns.  A1c is now back up to 9.4 compared to 8.8  He has been on basal bolus insulin and Metformin  As before he is not following instructions for changing his diet, insulin regimen or using the continuous glucose monitoring  HYPONATREMIA: Mild and asymptomatic, likely to be from HCTZ as before  Microalbumin normal and appears to be resolved now  Foot exam shows no neuropathic changes  Plan:  Tresiba 36 units daily to be continued  Novolog must be taken consistently before starting to eat both breakfast and dinner and make sure he takes it in the evenings consistently Discussed that he can take his NovoLog and Tresiba at the same time if needed  Since he is not able to get the freestyle libre we will continue fingerstick monitoring but start checking some readings at bedtime regularly  Restart walking for exercise  Needs to start watching his diet and discussed that he needs to eliminate all sweets such as donuts in the morning  Follow-up with PCP for his hypertension    Patient Instructions  Lets avoid all the sweets and donuts  NOVOLOG taken about 10 to 15 minutes before each meal 2X DAILY  Check blood sugars on waking up 3 days a week  Also check blood sugars about 2 hours after meals and do this after different meals by  rotation  Recommended blood sugar levels on waking up are 90-130 and about 2 hours after meal is 130-160      [name] 09/22/2022, 10:23 AM   Note: This office note was prepared with Dragon voice recognition system technology. Any transcriptional errors that result from this process are unintentional.

## 2022-09-22 NOTE — Patient Instructions (Signed)
Lets avoid all the sweets and donuts  NOVOLOG taken about 10 to 15 minutes before each meal 2X DAILY  Check blood sugars on waking up 3 days a week  Also check blood sugars about 2 hours after meals and do this after different meals by rotation  Recommended blood sugar levels on waking up are 90-130 and about 2 hours after meal is 130-160

## 2022-09-23 ENCOUNTER — Other Ambulatory Visit: Payer: Self-pay | Admitting: Family Medicine

## 2022-10-26 DIAGNOSIS — I2510 Atherosclerotic heart disease of native coronary artery without angina pectoris: Secondary | ICD-10-CM | POA: Diagnosis not present

## 2022-10-26 DIAGNOSIS — I10 Essential (primary) hypertension: Secondary | ICD-10-CM | POA: Diagnosis not present

## 2022-10-26 DIAGNOSIS — E663 Overweight: Secondary | ICD-10-CM | POA: Diagnosis not present

## 2022-10-26 DIAGNOSIS — Z794 Long term (current) use of insulin: Secondary | ICD-10-CM | POA: Diagnosis not present

## 2022-10-26 DIAGNOSIS — E1169 Type 2 diabetes mellitus with other specified complication: Secondary | ICD-10-CM | POA: Diagnosis not present

## 2022-10-26 DIAGNOSIS — N521 Erectile dysfunction due to diseases classified elsewhere: Secondary | ICD-10-CM | POA: Diagnosis not present

## 2022-10-26 DIAGNOSIS — I509 Heart failure, unspecified: Secondary | ICD-10-CM | POA: Diagnosis not present

## 2022-10-26 DIAGNOSIS — E785 Hyperlipidemia, unspecified: Secondary | ICD-10-CM | POA: Diagnosis not present

## 2022-10-26 DIAGNOSIS — E261 Secondary hyperaldosteronism: Secondary | ICD-10-CM | POA: Diagnosis not present

## 2022-10-26 DIAGNOSIS — E1165 Type 2 diabetes mellitus with hyperglycemia: Secondary | ICD-10-CM | POA: Diagnosis not present

## 2022-10-26 DIAGNOSIS — I110 Hypertensive heart disease with heart failure: Secondary | ICD-10-CM | POA: Diagnosis not present

## 2022-10-26 DIAGNOSIS — M109 Gout, unspecified: Secondary | ICD-10-CM | POA: Diagnosis not present

## 2022-12-20 ENCOUNTER — Other Ambulatory Visit (INDEPENDENT_AMBULATORY_CARE_PROVIDER_SITE_OTHER): Payer: PPO

## 2022-12-20 DIAGNOSIS — E1165 Type 2 diabetes mellitus with hyperglycemia: Secondary | ICD-10-CM | POA: Diagnosis not present

## 2022-12-20 DIAGNOSIS — Z794 Long term (current) use of insulin: Secondary | ICD-10-CM

## 2022-12-21 LAB — BASIC METABOLIC PANEL
BUN: 18 mg/dL (ref 6–23)
CO2: 25 mEq/L (ref 19–32)
Calcium: 9.7 mg/dL (ref 8.4–10.5)
Chloride: 99 mEq/L (ref 96–112)
Creatinine, Ser: 1.08 mg/dL (ref 0.40–1.50)
GFR: 66.35 mL/min (ref >60.00)
Glucose, Bld: 134 mg/dL — ABNORMAL HIGH (ref 70–99)
Potassium: 3.9 mEq/L (ref 3.5–5.1)
Sodium: 136 mEq/L (ref 135–145)

## 2022-12-21 LAB — HEMOGLOBIN A1C: Hgb A1c MFr Bld: 8.4 % — ABNORMAL HIGH (ref 4.6–6.5)

## 2022-12-23 ENCOUNTER — Ambulatory Visit (INDEPENDENT_AMBULATORY_CARE_PROVIDER_SITE_OTHER): Payer: PPO | Admitting: Endocrinology

## 2022-12-23 VITALS — BP 120/60 | HR 61 | Ht 72.0 in | Wt 217.0 lb

## 2022-12-23 DIAGNOSIS — I10 Essential (primary) hypertension: Secondary | ICD-10-CM | POA: Diagnosis not present

## 2022-12-23 DIAGNOSIS — E1165 Type 2 diabetes mellitus with hyperglycemia: Secondary | ICD-10-CM

## 2022-12-23 DIAGNOSIS — E119 Type 2 diabetes mellitus without complications: Secondary | ICD-10-CM | POA: Diagnosis not present

## 2022-12-23 DIAGNOSIS — Z794 Long term (current) use of insulin: Secondary | ICD-10-CM | POA: Diagnosis not present

## 2022-12-23 DIAGNOSIS — E1169 Type 2 diabetes mellitus with other specified complication: Secondary | ICD-10-CM

## 2022-12-23 MED ORDER — FREESTYLE LIBRE 2 SENSOR MISC: 2.0000 | 3 refills | Status: DC

## 2022-12-23 NOTE — Progress Notes (Unsigned)
Patient ID: Patrick Wilcox, male   DOB: [birth year], 77 y.o.   MRN: [id number]    Reason for Appointment: Followup for Type 2 Diabetes   History of Present Illness:          Diagnosis: Type 2 diabetes mellitus, date of diagnosis: 2004       Past history:  At diagnosis he was having symptoms of drowsiness, difficulty breathing and blood sugar was reportedly over 600 He was initially treated with insulin but subsequently switched back to oral drugs He may have taken metformin and other medications for some time before going back on insulin His records indicate that he had been taking Amaryl and Januvia before going back on insulin in mid-2014 when his A1c had gone up over 13 Initially was given premixed insulin and then switched to Lantus and Humalog Record of his A1c indicates that his levels previously had been over 8% since 02/2012 He  had much better blood sugar control on his last visit with his changing his lifestyle and being instructed by the nurse educator  His Lantus did not appear to have 24 hour control with once a day injection  Recent history:   INSULIN regimen is described as:  36 units Tresiba daily in pm., Novolog 0-20 units with breakfast and dinner        Oral hypoglycemic drugs the patient is taking are: Metformin 1 g twice a day     His A1c is 8.4 compared to 9.4  Glucose monitoring, current management and problems identified: He did not pick up the freestyle libre from the pharmacy 1 dose prescription was sent as he claims that they did not have it Again checking blood sugars only in the morning and difficult to know what his blood sugar patterns are His history is somewhat inconsistent He now says that sometimes he will take his Patrick Wilcox both morning and evening depending on how much he is eating along with NovoLog Sometimes will only eat 1 meal a day in the late morning This may have caused one of his readings to be 67 in the morning Does not change  his NovoLog based on what he is eating Also he is still drinking regular soft drinks regularly Frequently for breakfast he will still eat donuts  He is trying to do his walking Weight is up 6 pounds    DIET: Has seen the dietitian in 2014 and nurse educator in 8/18        Meals: 2 meals per day 9-10 am and 6 pm.   Breakfast is  eggs;  toast, eggs, has fruit for snacks.  Evening meal usually vegetables, tuna salad, chicken.       Compliance with the medical regimen:  Fair    Glucose monitoring: Checking less than 1 times a day        Glucometer:  One Touch Verio    Blood Glucose readings  from meter download  RANGE 67-226, all blood sugars in the mornings,: AVERAGE: 123  PREVIOUS AVERAGE 118 with 14-day range 89-141   Exercise: Walking regularly, up to 5 days a week  Weight history:  Wt Readings from Last 3 Encounters:  12/23/22 217 lb (98.4 kg)  09/22/22 211 lb (95.7 kg)  09/12/22 208 lb 4 oz (94.5 kg)    Glycemic control:   Lab Results  Component Value Date   HGBA1C 8.4 (H) 12/20/2022   HGBA1C 9.4 (H) 09/19/2022   HGBA1C 8.8 (H) 06/20/2022   Lab Results  Component Value Date   MICROALBUR 11.3 (H) 09/19/2022   LDLCALC 48 11/10/2020   CREATININE 1.08 12/20/2022   Lab Results  Component Value Date   FRUCTOSAMINE 305 (H) 06/01/2020   FRUCTOSAMINE 296 (H) 12/31/2019   FRUCTOSAMINE 315 (H) 09/09/2019    Other active problems: See review of systems   Lab on 12/20/2022  Component Date Value Ref Range Status   Sodium 12/20/2022 136  135 - 145 mEq/L Final   Potassium 12/20/2022 3.9 Hemolysis Seen...  3.5 - 5.1 mEq/L Final   Chloride 12/20/2022 99  96 - 112 mEq/L Final   CO2 12/20/2022 25  19 - 32 mEq/L Final   Glucose, Bld 12/20/2022 134 (H)  70 - 99 mg/dL Final   BUN 16/04/9603 18  6 - 23 mg/dL Final   Creatinine, Ser 12/20/2022 1.08  0.40 - 1.50 mg/dL Final   GFR 54/03/8118 66.35  >60.00 mL/min Final   Calculated using the CKD-EPI Creatinine Equation  (2021)   Calcium 12/20/2022 9.7  8.4 - 10.5 mg/dL Final   Hgb J4N MFr Bld 12/20/2022 8.4 (H)  4.6 - 6.5 % Final   Glycemic Control Guidelines for People with Diabetes:Non Diabetic:  <6%Goal of Therapy: <7%Additional Action Suggested:  >8%       Allergies as of 12/23/2022   No Known Allergies      Medication List        Accurate as of December 23, 2022 11:59 PM. If you have any questions, ask your nurse or doctor.          STOP taking these medications    bacitracin 500 UNIT/GM ointment       TAKE these medications    amLODipine 10 MG tablet Commonly known as: NORVASC Take 1 tablet (10 mg total) by mouth daily.   aspirin 81 MG tablet Take 81 mg by mouth daily.   atorvastatin 40 MG tablet Commonly known as: LIPITOR Take 1 tablet (40 mg total) by mouth daily.   B-D SINGLE USE SWABS REGULAR Pads Use as necessary   B-D UF III MINI PEN NEEDLES 31G X 5 MM Misc Generic drug: Insulin Pen Needle USE ONCE DAILY   clobetasol ointment 0.05 % Commonly known as: TEMOVATE Apply 1 application topically 2 (two) times daily.   colchicine 0.6 MG tablet Take 1 tablet (0.6 mg total) by mouth daily as needed (for gout attack.).   Fluocinolone Acetonide Body 0.01 % Oil Apply 1 application. topically as directed.   FreeStyle Libre 2 Sensor Misc 2 Devices by Does not apply route every 14 (fourteen) days.   furosemide 20 MG tablet Commonly known as: LASIX TAKE 1 TABLET (20 MG TOTAL) BY MOUTH DAILY AS NEEDED FOR EDEMA.   hydrochlorothiazide 12.5 MG capsule Commonly known as: MICROZIDE Take 1 capsule (12.5 mg total) by mouth daily.   metFORMIN 1000 MG tablet Commonly known as: GLUCOPHAGE TAKE ONE TABLET BY MOUTH TWICE DAILY. TAKE WITH A MEAL.   metoprolol tartrate 100 MG tablet Commonly known as: LOPRESSOR Take 1 tablet (100 mg total) by mouth 2 (two) times daily.   NovoLOG FlexPen 100 UNIT/ML FlexPen Generic drug: insulin aspart Inject 20-22 Units into the skin 2 (two)  times daily before a meal. E11.69   OneTouch Delica Lancets 33G Misc Use to check blood sugar twice daily. DX Code E11.9   OneTouch Verio IQ System w/Device Kit USE TO CHECK BLOOD SUGAR TWICE A DAY DX CODE E11.9   OneTouch Verio test strip Generic drug: glucose blood  USE AS INSTRUCTED   ramipril 10 MG capsule Commonly known as: ALTACE TAKE 1 CAPSULE BY MOUTH EVERY DAY   sildenafil 50 MG tablet Commonly known as: VIAGRA TAKE ONE TABLET BY MOUTH DAILY AS NEEDED FOR ERECTILE DYSFUNCTION   spironolactone 25 MG tablet Commonly known as: ALDACTONE TAKE 1 TABLET BY MOUTH EVERY DAY FOR BLOOD PRESSURE   Tresiba FlexTouch 100 UNIT/ML FlexTouch Pen Generic drug: insulin degludec Inject 38 Units into the skin daily. 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 He has never used smokeless tobacco. He reports that he does not drink alcohol and does not use drugs.    Review of Systems        Lipids:  LDL has been treated to goal with Lipitor 40 mg Has history of CAD, followed by PCP HDL has been low       Lab Results  Component Value Date   CHOL 102 11/10/2020   HDL 27.70 (L) 11/10/2020   LDLCALC 48 11/10/2020   LDLDIRECT 71.0 02/28/2022   TRIG 129.0 11/10/2020   CHOLHDL 4 11/10/2020       Is high blood pressure has been  treated with Norvasc 10 mg, ramipril 10 mg, HCTZ 12.5, Aldactone 25 mg and metoprolol, Followed by PCP.    He is on Lasix from PCP     BP Readings from Last 3 Encounters:  12/23/22 120/60  09/22/22 (!) 152/82  09/12/22 110/70   Lab Results  Component Value Date   K 3.9 Hemolysis Seen... 12/20/2022   Complications: Microalbuminuria Microalbumin history:  Lab Results  Component Value Date   MICRALBCREAT 7.0 09/19/2022   MICRALBCREAT 39.9 (H) 11/25/2021   MICRALBCREAT 39.9 (H) 02/11/2021   MICRALBCREAT 55.6 (H) 07/30/2020     Diabetic foot exam was done in 09/2022 No symptoms of neuropathy  Last exam with eye doctor  was 4/20 and has not made an appointment despite reminders   LABS:  Lab on 12/20/2022  Component Date Value Ref Range Status   Sodium 12/20/2022 136  135 - 145 mEq/L Final   Potassium 12/20/2022 3.9 Hemolysis Seen...  3.5 - 5.1 mEq/L Final   Chloride 12/20/2022 99  96 - 112 mEq/L Final   CO2 12/20/2022 25  19 - 32 mEq/L Final   Glucose, Bld 12/20/2022 134 (H)  70 - 99 mg/dL Final   BUN 16/04/9603 18  6 - 23 mg/dL Final   Creatinine, Ser 12/20/2022 1.08  0.40 - 1.50 mg/dL Final   GFR 54/03/8118 66.35  >60.00 mL/min Final   Calculated using the CKD-EPI Creatinine Equation (2021)   Calcium 12/20/2022 9.7  8.4 - 10.5 mg/dL Final   Hgb J4N MFr Bld 12/20/2022 8.4 (  H)  4.6 - 6.5 % Final   Glycemic Control Guidelines for People with Diabetes:Non Diabetic:  <6%Goal of Therapy: <7%Additional Action Suggested:  >8%     Physical Examination:  BP 120/60 (BP Location: Left Arm, Patient Position: Sitting, Cuff Size: Large)   Pulse 61   Ht 6' (1.829 m)   Wt 217 lb (98.4 kg)   SpO2 98%   BMI 29.43 kg/m       ASSESSMENT/PLAN  Diabetes type 2, On insulin   See history of present illness for detailed discussion of current management, problems identified and blood sugar patterns.  A1c is now 8.4 and improved, was 9.4  He has been on basal bolus insulin and  Metformin  As above he appears to have very variable compliance with both basal and bolus insulins and does not understand actions of both of the insulins Appears to be occasionally taking [place] twice a day if he is eating 2 full meals a day Also with regular soft drinks and sweets like donuts his blood sugars are likely variable and mostly high postprandially  He has been told about changing his diet and timing of insulin as well as need for taking Patrick Wilcox consistently at the same time previously also  HYPONATREMIA: Resolved  HYPERTENSION: Well-controlled  Plan:  Tresiba 36 units daily to be taken consistently in the morning and not in the evening  Novolog will be taken consistently before starting to eat his main meals He will reduce the dose to 10 to 15 units if eating a smaller meal especially in the evening Start checking blood sugars consistently couple of hours after eating Stop eating donuts and drinking regular soft drinks  Again sent prescription for the libre sensor and discussed that he can be trained on how to use this, he already has the freestyle reader Discussed benefits of CGM and this should also help him understand how to take the insulin  Consistent walking for exercise  He will follow-up with the diabetes educator once he picks up his freestyle libre prescription  Since he does not make an appointment for his ophthalmology exam will send a referral, discussed importance of eye exams with having diabetes  Patient Instructions  Stop Reg soft drinks  Take Tresiba 36 ONLY ONCE DAILY  Take smaller dose of Novolog (10-15) at supper for lite meals  Check blood sugars on waking up days a week  Also check blood sugars about 2 hours after meals and do this after different meals by rotation  Recommended blood sugar levels on waking up are 90-130 and about 2 hours after meal is 130-180  Please bring your blood sugar monitor to each visit, thank you  Need eye  doctor : make an appointment   Total visit time for evaluation and management and counseling = 30 minutes   Reather Littler 12/24/2022, 10:21 AM   Note: This office note was prepared with Dragon voice recognition system technology. Any transcriptional errors that result from this process are unintentional.

## 2022-12-23 NOTE — Patient Instructions (Addendum)
Stop Reg soft drinks  Take Tresiba 36 ONLY ONCE DAILY  Take smaller dose of Novolog (10-15) at supper for lite meals  Check blood sugars on waking up days a week  Also check blood sugars about 2 hours after meals and do this after different meals by rotation  Recommended blood sugar levels on waking up are 90-130 and about 2 hours after meal is 130-180  Please bring your blood sugar monitor to each visit, thank you  Need eye doctor : make an appointment

## 2023-02-06 ENCOUNTER — Telehealth: Payer: Self-pay

## 2023-02-06 NOTE — Telephone Encounter (Signed)
Contacted patient on preferred number listed in notes for scheduled AWV. Patient stated telephone not working properly unable to complete visit.

## 2023-03-14 ENCOUNTER — Other Ambulatory Visit: Payer: PPO

## 2023-03-15 ENCOUNTER — Ambulatory Visit: Payer: PPO | Admitting: Family Medicine

## 2023-03-15 ENCOUNTER — Other Ambulatory Visit (INDEPENDENT_AMBULATORY_CARE_PROVIDER_SITE_OTHER): Payer: PPO

## 2023-03-15 DIAGNOSIS — E1165 Type 2 diabetes mellitus with hyperglycemia: Secondary | ICD-10-CM

## 2023-03-15 DIAGNOSIS — Z794 Long term (current) use of insulin: Secondary | ICD-10-CM

## 2023-03-15 LAB — BASIC METABOLIC PANEL
BUN: 14 mg/dL (ref 6–23)
CO2: 29 meq/L (ref 19–32)
Calcium: 9.5 mg/dL (ref 8.4–10.5)
Chloride: 97 meq/L (ref 96–112)
Creatinine, Ser: 1.09 mg/dL (ref 0.40–1.50)
GFR: 65.51 mL/min (ref >60.00)
Glucose, Bld: 252 mg/dL — ABNORMAL HIGH (ref 70–99)
Potassium: 4.2 meq/L (ref 3.5–5.1)
Sodium: 136 meq/L (ref 135–145)

## 2023-03-15 LAB — HEMOGLOBIN A1C: Hgb A1c MFr Bld: 8.6 % — ABNORMAL HIGH (ref 4.6–6.5)

## 2023-03-15 NOTE — Progress Notes (Signed)
HPI: Patrick Wilcox is a 77 y.o. male with PMHx significant for hypertension, HFpEF, hyperlipidemia, gout, and DM2 here today for chronic disease management.  Last seen on 09/12/22. Since his last visit he has seen endocrine neurologist, Dr. Lucianne Muss, 12/23/2022. He had labs recently and planning on following with a new endocrinologist next week.  Hypertension:  His BP is elevated today, with CVS readings around 140/70's mmHg. He admits to not checking it as frequently as recommended. He also experiences occasional dizziness and drowsiness after eating, which he attributes to forgetting to take his medications and then taking them all at once. Denies chest pain during these episodes. He is currently taking: - Amlodipine 10 mg daily   - HCTZ 1/2 tab daily - Spironolactone 25 mg daily - Metoprolol titrate 100 mg BID - Enalapril 10 mg daily. Last visit with his cardiologist on 06/08/2021.  Negative for unusual or severe headache, visual changes, exertional chest pain, dyspnea,  focal weakness, or edema.  Lab Results  Component Value Date   CREATININE 1.09 03/15/2023   BUN 14 03/15/2023   NA 136 03/15/2023   K 4.2 03/15/2023   CL 97 03/15/2023   CO2 29 03/15/2023   HLD: He is on atorvastatin 40 mg daily. He is not fasting today. Lab Results  Component Value Date   CHOL 102 11/10/2020   HDL 27.70 (L) 11/10/2020   LDLCALC 48 11/10/2020   LDLDIRECT 71.0 02/28/2022   TRIG 129.0 11/10/2020   CHOLHDL 4 11/10/2020   He cooks for himself and does not add salt to his meals.  He eats twice a day - breakfast and brunch around 10:30-11 am, skips lunch.  He has not seen his eye care provider recently but is working on scheduling an appointment.   Review of Systems  Constitutional:  Negative for activity change, appetite change and fever.  HENT:  Negative for nosebleeds, sore throat and trouble swallowing.   Respiratory:  Negative for cough and wheezing.   Gastrointestinal:   Negative for abdominal pain, nausea and vomiting.  Endocrine: Negative for cold intolerance and heat intolerance.  Genitourinary:  Negative for decreased urine volume, dysuria and hematuria.  Neurological:  Negative for syncope and facial asymmetry.  See other pertinent positives and negatives in HPI.  Current Outpatient Medications on File Prior to Visit  Medication Sig Dispense Refill   Alcohol Swabs (B-D SINGLE USE SWABS REGULAR) PADS Use as necessary 300 each 6   amLODipine (NORVASC) 10 MG tablet Take 1 tablet (10 mg total) by mouth daily. 90 tablet 3   aspirin 81 MG tablet Take 81 mg by mouth daily.     atorvastatin (LIPITOR) 40 MG tablet Take 1 tablet (40 mg total) by mouth daily. 90 tablet 1   Blood Glucose Monitoring Suppl (ONETOUCH VERIO IQ SYSTEM) w/Device KIT USE TO CHECK BLOOD SUGAR TWICE A DAY DX CODE E11.9 1 kit 2   clobetasol ointment (TEMOVATE) 0.05 % Apply 1 application topically 2 (two) times daily. 30 g 0   colchicine 0.6 MG tablet Take 1 tablet (0.6 mg total) by mouth daily as needed (for gout attack.). 60 tablet 1   Continuous Glucose Sensor (FREESTYLE LIBRE 2 SENSOR) MISC 2 Devices by Does not apply route every 14 (fourteen) days. 6 each 3   Fluocinolone Acetonide Body 0.01 % OIL Apply 1 application  topically as directed.     furosemide (LASIX) 20 MG tablet TAKE 1 TABLET (20 MG TOTAL) BY MOUTH DAILY AS NEEDED  FOR EDEMA. 90 tablet 2   glucose blood (ONETOUCH VERIO) test strip USE AS INSTRUCTED 200 strip 1   insulin aspart (NOVOLOG FLEXPEN) 100 UNIT/ML FlexPen Inject 20-22 Units into the skin 2 (two) times daily before a meal. E11.69 45 mL 1   insulin degludec (TRESIBA FLEXTOUCH) 100 UNIT/ML FlexTouch Pen Inject 38 Units into the skin daily. E11.69 45 mL 1   Insulin Pen Needle (B-D UF III MINI PEN NEEDLES) 31G X 5 MM MISC USE ONCE DAILY 100 each 3   metFORMIN (GLUCOPHAGE) 1000 MG tablet TAKE ONE TABLET BY MOUTH TWICE DAILY. TAKE WITH A MEAL. 180 tablet 1   metoprolol  tartrate (LOPRESSOR) 100 MG tablet Take 1 tablet (100 mg total) by mouth 2 (two) times daily. 180 tablet 2   OneTouch Delica Lancets 33G MISC Use to check blood sugar twice daily. DX Code E11.9 200 each 1   sildenafil (VIAGRA) 50 MG tablet TAKE ONE TABLET BY MOUTH DAILY AS NEEDED FOR ERECTILE DYSFUNCTION 90 tablet 0   No current facility-administered medications on file prior to visit.    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   PNEUMONIA, LEFT LOWER LOBE 09/08/2009   Rash    No Known Allergies  Social History   Socioeconomic History   Marital status: Widowed    Spouse name: Not on file   Number of children: 2   Years of education: Not on file   Highest education level: Not on file  Occupational History    Comment: Retired from [employer]  Tobacco Use   Smoking status: Never   Smokeless tobacco: Never  Vaping Use   Vaping status: Never Used  Substance and Sexual Activity   Alcohol use: No    Comment: stopped drinking etoh completely in Apr 20, 2000   Drug use: No   Sexual activity: Yes    Comment: uses viagra  Other Topics Concern   Not on file  Social History Narrative   Lives alone   Retired from [employer] in 04-20-00   Twin sons, one son died of a stroke this year 04/21/19) at age 80, his living son resides in [place]    His wife died of breast cancer 11 years ago    Social Determinants of Health   Financial Resource Strain: Low Risk  (02/02/2022)   Overall Financial Resource Strain (CARDIA)    Difficulty of Paying Living Expenses: Not hard at all  Food Insecurity: No Food Insecurity (02/02/2022)   Hunger Vital Sign    Worried About Running Out of Food in the Last Year: Never true    Ran Out of Food in the Last Year: Never true  Transportation  Needs: No Transportation Needs (02/02/2022)   PRAPARE - Administrator, Civil Service (Medical): No    Lack of Transportation (Non-Medical): No  Physical Activity: Sufficiently Active (02/02/2022)   Exercise Vital Sign    Days of Exercise per Week: 7 days    Minutes of Exercise per Session: 150+ min  Stress: No Stress Concern Present (02/02/2022)   [employer] of Occupational Health - Occupational Stress Questionnaire    Feeling of Stress : Not at all  Social Connections: Moderately Integrated (02/02/2022)   Social Connection and Isolation Panel [NHANES]  Frequency of Communication with Friends and Family: More than three times a week    Frequency of Social Gatherings with Friends and Family: More than three times a week    Attends Religious Services: More than 4 times per year    Active Member of Clubs or Organizations: Yes    Attends Banker Meetings: More than 4 times per year    Marital Status: Never married    Vitals:   03/17/23 1218 03/17/23 1257  BP: (!) 142/60 (!) 140/70  Pulse: 67   Resp: 16   Temp: 98.5 F (36.9 C)   SpO2: 99%    Body mass index is 29.85 kg/m.  Physical Exam Vitals and nursing note reviewed.  Constitutional:      General: He is not in acute distress.    Appearance: He is well-developed.  HENT:     Head: Normocephalic and atraumatic.     Mouth/Throat:     Mouth: Mucous membranes are moist.  Eyes:     Conjunctiva/sclera: Conjunctivae normal.  Cardiovascular:     Rate and Rhythm: Normal rate and regular rhythm.     Heart sounds: Murmur (SEM I/VI RUSB) heard.  Pulmonary:     Effort: Pulmonary effort is normal. No respiratory distress.     Breath sounds: Normal breath sounds.  Abdominal:     Palpations: Abdomen is soft. There is no hepatomegaly or mass.     Tenderness: There is no abdominal tenderness.  Lymphadenopathy:     Cervical: No cervical adenopathy.  Skin:    General: Skin is warm.     Findings: No  erythema or rash.  Neurological:     Mental Status: He is alert and oriented to person, place, and time.     Cranial Nerves: No cranial nerve deficit.     Gait: Gait normal.  Psychiatric:        Mood and Affect: Mood and affect normal.   ASSESSMENT AND PLAN:  Mr. Jahmari was seen today for medical management of chronic issues.  Diagnoses and all orders for this visit: Orders Placed This Encounter  Procedures   Flu Vaccine Trivalent High Dose (Fluad)   Pure hypercholesterolemia Assessment & Plan: LDL 71 in 02/2022. He is not fasting today. Continue atorvastatin 40 mg daily. We will plan on lipid panel next visit.   Resistant hypertension Assessment & Plan: He has BP mildly elevated today, BP rechecked: 140/70. He agrees with changing HCTZ 12.5 mg daily for chlorthalidone 25 mg 1/2 tablet daily. Continue amlodipine 10 mg daily, spironolactone 25 mg daily, metoprolol titrate 100 mg twice daily, and enalapril 10 mg daily.  Recommend spreading medication throughout the day, he can take chlorthalidone and spironolactone in the morning and enalapril + amlodipine in the afternoon. Continue low-salt/DASH diet. He is due for eye exam. Instructed to monitor BP daily for the next 2 weeks, morning and night, and to let me know about BP readings. Follow-up in 5 months, before if needed.  Orders: -     Chlorthalidone; Take 0.5 tablets (12.5 mg total) by mouth daily. For blood pressure.  Dispense: 45 tablet; Refill: 1 -     Ramipril; Take 1 capsule (10 mg total) by mouth daily.  Dispense: 90 capsule; Refill: 2 -     Spironolactone; TAKE 1 TABLET BY MOUTH EVERY DAY FOR BLOOD PRESSURE  Dispense: 90 tablet; Refill: 1  Need for influenza vaccination -     Flu Vaccine Trivalent High Dose (Fluad)   Return in  about 5 months (around 08/17/2023).  Jaylianna Tatlock G. [place], MD  [facility] [place] [facility].

## 2023-03-17 ENCOUNTER — Encounter: Payer: Self-pay | Admitting: Family Medicine

## 2023-03-17 ENCOUNTER — Ambulatory Visit (INDEPENDENT_AMBULATORY_CARE_PROVIDER_SITE_OTHER): Payer: PPO | Admitting: Family Medicine

## 2023-03-17 VITALS — BP 140/70 | HR 67 | Temp 98.5°F | Resp 16 | Ht 72.0 in | Wt 220.1 lb

## 2023-03-17 DIAGNOSIS — E7800 Pure hypercholesterolemia, unspecified: Secondary | ICD-10-CM

## 2023-03-17 DIAGNOSIS — I1A0 Resistant hypertension: Secondary | ICD-10-CM | POA: Diagnosis not present

## 2023-03-17 DIAGNOSIS — Z23 Encounter for immunization: Secondary | ICD-10-CM | POA: Diagnosis not present

## 2023-03-17 DIAGNOSIS — E1169 Type 2 diabetes mellitus with other specified complication: Secondary | ICD-10-CM

## 2023-03-17 MED ORDER — SPIRONOLACTONE 25 MG PO TABS: ORAL_TABLET | ORAL | 1 refills | Status: DC

## 2023-03-17 MED ORDER — CHLORTHALIDONE 25 MG PO TABS
12.5000 mg | ORAL_TABLET | Freq: Every day | ORAL | 1 refills | Status: DC
Start: 2023-03-17 — End: 2023-07-21

## 2023-03-17 MED ORDER — RAMIPRIL 10 MG PO CAPS
10.0000 mg | ORAL_CAPSULE | Freq: Every day | ORAL | 2 refills | Status: DC
Start: 2023-03-17 — End: 2023-07-21

## 2023-03-17 NOTE — Assessment & Plan Note (Signed)
LDL 71 in 02/2022. He is not fasting today. Continue atorvastatin 40 mg daily. We will plan on lipid panel next visit.

## 2023-03-17 NOTE — Assessment & Plan Note (Signed)
He has BP mildly elevated today, BP rechecked: 140/70. He agrees with changing HCTZ 12.5 mg daily for chlorthalidone 25 mg 1/2 tablet daily. Continue amlodipine 10 mg daily, spironolactone 25 mg daily, metoprolol titrate 100 mg twice daily, and enalapril 10 mg daily.  Recommend spreading medication throughout the day, he can take chlorthalidone and spironolactone in the morning and enalapril + amlodipine in the afternoon. Continue low-salt/DASH diet. He is due for eye exam. Instructed to monitor BP daily for the next 2 weeks, morning and night, and to let me know about BP readings. Follow-up in 5 months, before if needed.

## 2023-03-17 NOTE — Patient Instructions (Addendum)
A few things to remember from today's visit:  Pure hypercholesterolemia  Resistant hypertension - Plan: chlorthalidone (HYGROTON) 25 MG tablet  Today we changed hydrochlorothiazide for chlorthalidone 1/2 tab daily. Rest unchanged. Take fluid pills in the morning and amlodipine + enalapril afternoon, and continue Metoprolol 2 times daily. Let me know about blood pressure readings in 2 weeks, take it in the morning and at night. Next visit we can plan on fasting labs.  If you need refills for medications you take chronically, please call your pharmacy. Do not use My Chart to request refills or for acute issues that need immediate attention. If you send a my chart message, it may take a few days to be addressed, specially if I am not in the office.  Please be sure medication list is accurate. If a new problem present, please set up appointment sooner than planned today.

## 2023-03-21 ENCOUNTER — Encounter: Payer: Self-pay | Admitting: Endocrinology

## 2023-03-21 ENCOUNTER — Ambulatory Visit: Payer: PPO | Admitting: Endocrinology

## 2023-03-21 VITALS — BP 140/70 | HR 77 | Ht 72.0 in | Wt 217.6 lb

## 2023-03-21 DIAGNOSIS — Z7984 Long term (current) use of oral hypoglycemic drugs: Secondary | ICD-10-CM

## 2023-03-21 DIAGNOSIS — Z794 Long term (current) use of insulin: Secondary | ICD-10-CM | POA: Diagnosis not present

## 2023-03-21 DIAGNOSIS — E1165 Type 2 diabetes mellitus with hyperglycemia: Secondary | ICD-10-CM

## 2023-03-21 DIAGNOSIS — E1169 Type 2 diabetes mellitus with other specified complication: Secondary | ICD-10-CM | POA: Diagnosis not present

## 2023-03-21 MED ORDER — ONETOUCH VERIO VI STRP
ORAL_STRIP | 3 refills | Status: DC
Start: 2023-03-21 — End: 2024-03-28

## 2023-03-21 MED ORDER — PIOGLITAZONE HCL 15 MG PO TABS: 15.0000 mg | ORAL_TABLET | Freq: Every day | ORAL | 3 refills | Status: DC

## 2023-03-21 MED ORDER — TRESIBA FLEXTOUCH 100 UNIT/ML ~~LOC~~ SOPN
36.0000 [IU] | PEN_INJECTOR | Freq: Every day | SUBCUTANEOUS | 1 refills | Status: DC
Start: 2023-03-21 — End: 2023-06-20

## 2023-03-21 NOTE — Progress Notes (Signed)
Outpatient Endocrinology Note [place] Annebelle Bostic, MD  03/21/23  Patient's Name: Patrick Wilcox    DOB: [birth year]    MRN: [id number]                                                    REASON OF VISIT: Follow up for type 2 diabetes mellitus  PCP: [place], [name], MD  HISTORY OF PRESENT ILLNESS:   Patrick Wilcox is a 77 y.o. old male with past medical history listed below, is here for  follow up of type 2 diabetes mellitus.   Pertinent Diabetes History: Patient was diagnosed with type 2 diabetes mellitus in 2004.  He was initially treated with insulin and subsequently switched back to oral medications.  He had been on and off of the insulin in the past.  He was on Amaryl and Januvia in the past per record.  He was also on premixed insulin in the past which was switched to Lantus and Humalog later.  He had seen diabetic educator had lifestyle changes and had better control of diabetes however overall he has uncontrolled diabetes mellitus.  Chronic Diabetes Complications : Retinopathy: Unknown. Last ophthalmology exam was done on Due, referred to ophthalmology. Nephropathy: no, on ramipril.  History of microalbuminuria. Peripheral neuropathy: no Coronary artery disease: yes Stroke: no  Relevant comorbidities and cardiovascular risk factors: Obesity: no Body mass index is 29.51 kg/m.  Hypertension: yes Hyperlipidemia. Yes, on statin  Current / Home Diabetic regimen includes: Tresiba 36 units daily.  NovoLog 20 twice a day. Metformin 1000 mg 2 times a day.  Prior diabetic medications: Amaryl/glimepiride and Januvia.  Glycemic data:   He had to use freestyle libre CGM in the past, did not like the idea of alarms, in the accuracy of the reading at times, required to change often.  [employer]'s glucose meter is Location manager. Raw data and trends analyzed.  One Touch Verio glucometer. -  He has been testing his blood glucoses 1 time daily.  -  Average glucose for the last 30 days is  145 mg/dl, range 409 - 811. He has been checking blood sugar in the morning fasting.  Most of the blood sugar in 100-130 range.  Some of the blood sugar with mild hyperglycemia up to 180s.  No hypoglycemia. -  Trends noted: Mostly acceptable morning fasting blood sugar and occasional mild hyperglycemia.  Hypoglycemia: Patient has no hypoglycemic episodes. Patient has hypoglycemia awareness.  Factors modifying glucose control: 1.  Diabetic diet assessment: two meals a day.   2.  Staying active or exercising: walking.   3.  Medication compliance: compliant all of the time.  Interval history 03/21/23 Patient did not like the freestyle libre CGM much and not using it.  He would rather do the fingerstick blood sugar with glucose meter.  He has not seen the ophthalmologist yet for diabetic eye exam.  Denies complaints of numbness and tingling of the feet.  Glucometer data as reviewed above.  REVIEW OF SYSTEMS As per history of present illness.   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   PNEUMONIA, LEFT LOWER LOBE 09/08/2009   Rash     PAST SURGICAL HISTORY: Past Surgical History:  Procedure Laterality Date   CORONARY ANGIOPLASTY WITH STENT PLACEMENT     CORONARY ARTERY BYPASS GRAFT  31-Mar-2002    ALLERGIES: No Known Allergies  FAMILY HISTORY:  Family History  Problem Relation Age of Onset   Heart disease Mother    Heart disease Father    Diabetes Sister    Diabetes Brother     SOCIAL HISTORY: Social History   Socioeconomic History   Marital status: Widowed    Spouse name: Not on file   Number of children: 2   Years of education: Not on file   Highest education level: Not on file  Occupational History    Comment: Retired  from [employer]  Tobacco Use   Smoking status: Never   Smokeless tobacco: Never  Vaping Use   Vaping status: Never Used  Substance and Sexual Activity   Alcohol use: No    Comment: stopped drinking etoh completely in 03-31-2000   Drug use: No   Sexual activity: Yes    Comment: uses viagra  Other Topics Concern   Not on file  Social History Narrative   Lives alone   Retired from [employer] in March 31, 2000   Twin sons, one son died of a stroke this year 01-Apr-2019) at age 83, his living son resides in [place]    His wife died of breast cancer 11 years ago    Social Determinants of Health   Financial Resource Strain: Low Risk  (02/02/2022)   Overall Financial Resource Strain (CARDIA)    Difficulty of Paying Living Expenses: Not hard at all  Food Insecurity: No Food Insecurity (02/02/2022)   Hunger Vital Sign    Worried About Running Out of Food in the Last Year: Never true    Ran Out of Food in the Last Year: Never true  Transportation Needs: No Transportation Needs (02/02/2022)   PRAPARE - Administrator, Civil Service (Medical): No    Lack of Transportation (Non-Medical): No  Physical Activity: Sufficiently Active (02/02/2022)   Exercise Vital Sign    Days of Exercise per Week: 7 days    Minutes of Exercise per Session: 150+ min  Stress: No Stress Concern Present (02/02/2022)   [employer] of Occupational Health - Occupational Stress Questionnaire    Feeling of Stress : Not at all  Social Connections: Moderately Integrated (02/02/2022)   Social Connection and Isolation Panel [NHANES]    Frequency of Communication with Friends and Family: More than three times a week    Frequency of Social Gatherings with Friends and Family: More than three times a week    Attends Religious Services: More than 4 times per year    Active Member of [employer] or Organizations: Yes    Attends Engineer, structural: More than 4 times per year    Marital Status: Never married    MEDICATIONS:   Current Outpatient Medications  Medication Sig Dispense Refill   Alcohol Swabs (B-D SINGLE USE SWABS REGULAR) PADS Use as necessary 300 each 6   amLODipine (NORVASC) 10 MG tablet Take 1 tablet (10 mg total) by mouth daily. 90 tablet 3   aspirin 81 MG tablet Take 81 mg by mouth daily.     atorvastatin (LIPITOR) 40 MG tablet Take 1 tablet (40 mg total) by mouth daily. 90 tablet 1   Blood Glucose Monitoring Suppl (ONETOUCH VERIO IQ SYSTEM)  w/Device KIT USE TO CHECK BLOOD SUGAR TWICE A DAY DX CODE E11.9 1 kit 2   chlorthalidone (HYGROTON) 25 MG tablet Take 0.5 tablets (12.5 mg total) by mouth daily. For blood pressure. 45 tablet 1   clobetasol ointment (TEMOVATE) 0.05 % Apply 1 application topically 2 (two) times daily. 30 g 0   colchicine 0.6 MG tablet Take 1 tablet (0.6 mg total) by mouth daily as needed (for gout attack.). 60 tablet 1   Continuous Glucose Sensor (FREESTYLE LIBRE 2 SENSOR) MISC 2 Devices by Does not apply route every 14 (fourteen) days. 6 each 3   Fluocinolone Acetonide Body 0.01 % OIL Apply 1 application  topically as directed.     furosemide (LASIX) 20 MG tablet TAKE 1 TABLET (20 MG TOTAL) BY MOUTH DAILY AS NEEDED FOR EDEMA. 90 tablet 2   insulin aspart (NOVOLOG FLEXPEN) 100 UNIT/ML FlexPen Inject 20-22 Units into the skin 2 (two) times daily before a meal. E11.69 45 mL 1   Insulin Pen Needle (B-D UF III MINI PEN NEEDLES) 31G X 5 MM MISC USE ONCE DAILY 100 each 3   metFORMIN (GLUCOPHAGE) 1000 MG tablet TAKE ONE TABLET BY MOUTH TWICE DAILY. TAKE WITH A MEAL. 180 tablet 1   metoprolol tartrate (LOPRESSOR) 100 MG tablet Take 1 tablet (100 mg total) by mouth 2 (two) times daily. 180 tablet 2   OneTouch Delica Lancets 33G MISC Use to check blood sugar twice daily. DX Code E11.9 200 each 1   pioglitazone (ACTOS) 15 MG tablet Take 1 tablet (15 mg total) by mouth daily. 90 tablet 3   ramipril (ALTACE) 10 MG capsule Take 1 capsule (10 mg total) by mouth daily. 90 capsule 2   sildenafil  (VIAGRA) 50 MG tablet TAKE ONE TABLET BY MOUTH DAILY AS NEEDED FOR ERECTILE DYSFUNCTION 90 tablet 0   spironolactone (ALDACTONE) 25 MG tablet TAKE 1 TABLET BY MOUTH EVERY DAY FOR BLOOD PRESSURE 90 tablet 1   glucose blood (ONETOUCH VERIO) test strip Check before meals and at bedtime. 300 strip 3   insulin degludec (TRESIBA FLEXTOUCH) 100 UNIT/ML FlexTouch Pen Inject 36 Units into the skin daily. E11.69 45 mL 1   No current facility-administered medications for this visit.    PHYSICAL EXAM: Vitals:   03/21/23 0908  BP: (!) 140/70  Pulse: 77  SpO2: 98%  Weight: 217 lb 9.6 oz (98.7 kg)  Height: 6' (1.829 m)   Body mass index is 29.51 kg/m.  Wt Readings from Last 3 Encounters:  03/21/23 217 lb 9.6 oz (98.7 kg)  03/17/23 220 lb 2 oz (99.8 kg)  12/23/22 217 lb (98.4 kg)    General: Well developed, well nourished male in no apparent distress.  HEENT: AT/, no external lesions.  Eyes: Conjunctiva clear and no icterus. Neck: Neck supple  Lungs: Respirations not labored Neurologic: Alert, oriented, normal speech Extremities / Skin: Dry. No sores or rashes noted.  Psychiatric: Does not appear depressed or anxious  Diabetic Foot Exam - Simple   No data filed    LABS Reviewed Lab Results  Component Value Date   HGBA1C 8.6 (H) 03/15/2023   HGBA1C 8.4 (H) 12/20/2022   HGBA1C 9.4 (H) 09/19/2022   Lab Results  Component Value Date   FRUCTOSAMINE 305 (H) 06/01/2020   FRUCTOSAMINE 296 (H) 12/31/2019   FRUCTOSAMINE 315 (H) 09/09/2019   Lab Results  Component Value Date   CHOL 102 11/10/2020   HDL 27.70 (L) 11/10/2020   LDLCALC 48 11/10/2020  LDLDIRECT 71.0 02/28/2022   TRIG 129.0 11/10/2020   CHOLHDL 4 11/10/2020   Lab Results  Component Value Date   MICRALBCREAT 7.0 09/19/2022   MICRALBCREAT 39.9 (H) 11/25/2021   Lab Results  Component Value Date   CREATININE 1.09 03/15/2023   Lab Results  Component Value Date   GFR 65.51 03/15/2023    ASSESSMENT / PLAN  1.  Uncontrolled type 2 diabetes mellitus with hyperglycemia, with long-term current use of insulin (HCC)   2. Type 2 diabetes mellitus insulin-dependent   3. Long term current use of insulin (HCC)   4. Long term (current) use of oral hypoglycemic drugs     Diabetes Mellitus type 2, complicated by CAD - Diabetic status / severity: Uncontrolled  Lab Results  Component Value Date   HGBA1C 8.6 (H) 03/15/2023    - Hemoglobin A1c goal : <7%  Patient has uncontrolled diabetes mellitus.  Discussed about diet plan as well.  He has been checking blood sugar while in the morning fasting.  Asked to check at least at bedtime and if possible in the afternoon at least few times a week.  - Medications: See below  Diabetes regimen Continue Tresiba 36 units daily. Continue NovoLog 20 units with meals 2 times a day. Continue metformin 1000 mg 2 times a day. Start Actos/pioglitazone 15 mg daily.  Check blood sugar in the morning fasting and at bedtime at least twice a day.  If you want to try to use freestyle libre continuous glucose monitoring.  - Discussed/ Gave Hypoglycemia treatment plan.  # Consult : not required at this time.   # Annual urine for microalbuminuria/ creatinine ratio, no microalbuminuria currently, continue ACE/ARB /ramipril. Last  Lab Results  Component Value Date   MICRALBCREAT 7.0 09/19/2022    # Foot check nightly / neuropathy.  # Annual dilated diabetic eye exams.  Was referred to ophthalmology in last visit in June.  - Diet: Make healthy diabetic food choices - Life style / activity / exercise: Discussed.  2. Blood pressure  -  BP Readings from Last 1 Encounters:  03/21/23 (!) 140/70    - Control is in target.  - No change in current plans.  3. Lipid status / Hyperlipidemia - Last  Lab Results  Component Value Date   LDLCALC 48 11/10/2020   - Continue continue atorvastatin 40 mg daily.  Managed by primary care provider/cardiology.  Kierin was seen  today for follow-up and diabetes.  Diagnoses and all orders for this visit:  Uncontrolled type 2 diabetes mellitus with hyperglycemia, with long-term current use of insulin (HCC)  Type 2 diabetes mellitus insulin-dependent -     glucose blood (ONETOUCH VERIO) test strip; Check before meals and at bedtime. -     insulin degludec (TRESIBA FLEXTOUCH) 100 UNIT/ML FlexTouch Pen; Inject 36 Units into the skin daily. E11.69 -     Hemoglobin A1c; Future -     Basic metabolic panel; Future  Long term current use of insulin (HCC)  Long term (current) use of oral hypoglycemic drugs  Other orders -     pioglitazone (ACTOS) 15 MG tablet; Take 1 tablet (15 mg total) by mouth daily.    DISPOSITION Follow up in clinic in 3 months suggested.   All questions answered and patient verbalized understanding of the plan.  [place] Kayzen Kendzierski, MD [facility] [address] [place], [address] [place], [place] [address] Phone # [phone]  At least part of this note was generated  using voice recognition software. Inadvertent word errors may have occurred, which were not recognized during the proofreading process.

## 2023-03-21 NOTE — Patient Instructions (Signed)
Diabetes regimen Continue Tresiba 36 units daily. Continue NovoLog 20 units with meals 2 times a day. Continue metformin 1000 mg 2 times a day. Start Actos/pioglitazone 15 mg daily.  Check blood sugar in the morning fasting and at bedtime at least twice a day.  If you want to try to use freestyle libre continuous glucose monitoring.

## 2023-03-23 ENCOUNTER — Other Ambulatory Visit: Payer: Self-pay | Admitting: Family Medicine

## 2023-03-23 ENCOUNTER — Encounter: Payer: Self-pay | Admitting: Family Medicine

## 2023-03-23 ENCOUNTER — Ambulatory Visit (INDEPENDENT_AMBULATORY_CARE_PROVIDER_SITE_OTHER): Payer: PPO | Admitting: Family Medicine

## 2023-03-23 ENCOUNTER — Encounter: Payer: Self-pay | Admitting: Endocrinology

## 2023-03-23 VITALS — BP 123/68 | HR 67 | Temp 98.4°F | Ht 72.0 in | Wt 214.2 lb

## 2023-03-23 DIAGNOSIS — E1169 Type 2 diabetes mellitus with other specified complication: Secondary | ICD-10-CM

## 2023-03-23 DIAGNOSIS — M1A9XX0 Chronic gout, unspecified, without tophus (tophi): Secondary | ICD-10-CM

## 2023-03-23 DIAGNOSIS — L259 Unspecified contact dermatitis, unspecified cause: Secondary | ICD-10-CM

## 2023-03-23 DIAGNOSIS — I5032 Chronic diastolic (congestive) heart failure: Secondary | ICD-10-CM

## 2023-03-23 MED ORDER — TRIAMCINOLONE ACETONIDE 0.5 % EX OINT: 1.0000 | TOPICAL_OINTMENT | Freq: Two times a day (BID) | CUTANEOUS | 0 refills | Status: DC

## 2023-03-23 NOTE — Patient Instructions (Signed)
It was very nice to see you today!  I think you had some skin irritation due to a contact irritant.  This is called contact dermatitis.  Please use the triamcinolone.  Let us know if not improving by next week.  Return if symptoms worsen or fail to improve.   Take care, Dr Jimmey Ralph  PLEASE NOTE:  If you had any lab tests, please let us know if you have not heard back within a few days. You may see your results on mychart before we have a chance to review them but we will give you a call once they are reviewed by [place].   If we ordered any referrals today, please let us know if you have not heard from their office within the next week.   If you had any urgent prescriptions sent in today, please check with the pharmacy within an hour of our visit to make sure the prescription was transmitted appropriately.   Please try these tips to maintain a healthy lifestyle:  Eat at least 3 REAL meals and 1-2 snacks per day.  Aim for no more than 5 hours between eating.  If you eat breakfast, please do so within one hour of getting up.   Each meal should contain half fruits/vegetables, one quarter protein, and one quarter carbs (no bigger than a computer mouse)  Cut down on sweet beverages. This includes juice, soda, and sweet tea.   Drink at least 1 glass of water with each meal and aim for at least 8 glasses per day  Exercise at least 150 minutes every week.

## 2023-03-23 NOTE — Progress Notes (Signed)
   Patrick Wilcox is a 77 y.o. male who presents today for an office visit.  Assessment/Plan:  New/Acute Problems: Rash Consistent with contact dermatitis based on exam as rash spares interdigital areas as well as skin folds.  No other areas of desquamation and no mucous membrane symptoms-doubt more serious etiology such as Stevens-Johnson's or TENS.  Unclear if this is related to a substance that was in his shoe or some of the numerous topical treatments that he had tried afterwards.  Advised him to not place any other OTC topical treatments to the area at this point and avoid wearing the shoes that he wore to church.  Will start topical triamcinolone.  It is reassuring that symptoms have improved over the last few days.  He will let us know if not improving by next week or if any new symptoms arise.  We discussed reasons to return to care and seek emergent care.  Follow-up as needed.  Chronic Problems Addressed Today: GOUT No recent flares.  Reassured patient that current symptoms are not likely to be related to gout.  He is not on allopurinol.    Subjective:  HPI:  See A/P for status of chronic conditions.  Patient is here with painful rash on the left foot.  Started a few days ago.  Patient thinks the symptoms started at church.  He wore a pair shoes that did not work for a while and started noticing pain on the top of his feet.  A few days later developed a erythematous rash that looks like the skin had peeled off.  Pain and rash do seem to be improving over the last day or so.  He tried several topical treatments for the pain including Vaseline, Tiger balm, alcohol, hydrogen peroxide, Neosporin, etc.  None of these seem to have helped.       Objective:  Physical Exam: BP 123/68   Pulse 67   Temp 98.4 F (36.9 C) (Temporal)   Ht 6' (1.829 m)   Wt 214 lb 3.2 oz (97.2 kg)   SpO2 99%   BMI 29.05 kg/m   Gen: No acute distress, resting comfortably Skin: Confluent approximately 10 x  10 cm erythematous rash with desquamation on dorsal left foot and second and third digits, sparing skin folds and interdigital areas. Neuro: Grossly normal, moves all extremities Psych: Normal affect and thought content      Dan Scearce M. Jimmey Ralph, MD 03/23/2023 2:33 PM

## 2023-03-24 ENCOUNTER — Other Ambulatory Visit: Payer: Self-pay | Admitting: Family Medicine

## 2023-03-24 DIAGNOSIS — M1A9XX0 Chronic gout, unspecified, without tophus (tophi): Secondary | ICD-10-CM

## 2023-04-10 ENCOUNTER — Telehealth: Payer: Self-pay | Admitting: Family Medicine

## 2023-04-10 DIAGNOSIS — M1A9XX0 Chronic gout, unspecified, without tophus (tophi): Secondary | ICD-10-CM

## 2023-04-10 MED ORDER — COLCHICINE 0.6 MG PO TABS: 0.6000 mg | ORAL_TABLET | Freq: Every day | ORAL | 2 refills | Status: DC | PRN

## 2023-04-10 NOTE — Telephone Encounter (Signed)
Prescription Request  04/10/2023  LOV: 03/17/2023  What is the name of the medication or equipment? Colchicine.   Have you contacted your pharmacy to request a refill? No   Which pharmacy would you like this sent to? [id number] - [place], [place] - [address] AT [facility] OF [address] [place] [address] Phone: [phone] Fax: [phone]   Patient notified that their request is being sent to the clinical staff for review and that they should receive a response within 2 business days.   Please advise at Mobile [phone] (mobile)

## 2023-04-11 ENCOUNTER — Ambulatory Visit (INDEPENDENT_AMBULATORY_CARE_PROVIDER_SITE_OTHER): Payer: PPO | Admitting: Family Medicine

## 2023-04-11 ENCOUNTER — Encounter: Payer: Self-pay | Admitting: Family Medicine

## 2023-04-11 VITALS — BP 130/72 | HR 84 | Temp 98.4°F | Resp 16 | Ht 72.0 in | Wt 213.1 lb

## 2023-04-11 DIAGNOSIS — Z794 Long term (current) use of insulin: Secondary | ICD-10-CM | POA: Diagnosis not present

## 2023-04-11 DIAGNOSIS — R42 Dizziness and giddiness: Secondary | ICD-10-CM

## 2023-04-11 DIAGNOSIS — E1169 Type 2 diabetes mellitus with other specified complication: Secondary | ICD-10-CM | POA: Diagnosis not present

## 2023-04-11 DIAGNOSIS — I1A0 Resistant hypertension: Secondary | ICD-10-CM

## 2023-04-11 LAB — BASIC METABOLIC PANEL
BUN: 36 mg/dL — ABNORMAL HIGH (ref 6–23)
CO2: 26 meq/L (ref 19–32)
Calcium: 10.1 mg/dL (ref 8.4–10.5)
Chloride: 97 meq/L (ref 96–112)
Creatinine, Ser: 1.54 mg/dL — ABNORMAL HIGH (ref 0.40–1.50)
GFR: 43.25 mL/min — ABNORMAL LOW (ref >60.00)
Glucose, Bld: 175 mg/dL — ABNORMAL HIGH (ref 70–99)
Potassium: 4.5 meq/L (ref 3.5–5.1)
Sodium: 134 meq/L — ABNORMAL LOW (ref 135–145)

## 2023-04-11 LAB — CBC
HCT: 38.4 % — ABNORMAL LOW (ref 39.0–52.0)
Hemoglobin: 12.6 g/dL — ABNORMAL LOW (ref 13.0–17.0)
MCHC: 32.7 g/dL (ref 30.0–36.0)
MCV: 87.8 fL (ref 78.0–100.0)
Platelets: 260 10*3/uL (ref 150.0–400.0)
RBC: 4.38 Mil/uL (ref 4.22–5.81)
RDW: 14.4 % (ref 11.5–15.5)
WBC: 12.8 10*3/uL — ABNORMAL HIGH (ref 4.0–10.5)

## 2023-04-11 NOTE — Patient Instructions (Addendum)
A few things to remember from today's visit:  Dizziness - Plan: Basic metabolic panel, CBC  Resistant hypertension - Plan: Basic metabolic panel  Take Chlorthalidone and Spironolactone in the morning and Ramipril and Amlodipine at night. Metoprolol continue 2 times daily. Monitor blood pressures at home and heart rate. Let your endocrinologist know about Actos.  If you need refills for medications you take chronically, please call your pharmacy. Do not use My Chart to request refills or for acute issues that need immediate attention. If you send a my chart message, it may take a few days to be addressed, specially if I am not in the office.  Please be sure medication list is accurate. If a new problem present, please set up appointment sooner than planned today.

## 2023-04-11 NOTE — Progress Notes (Signed)
ACUTE VISIT Chief Complaint  Patient presents with   Dizziness    Ongoing since a week after last visit -unsure if medicine that was added is too strong    Medication Management   HPI: Patrick Wilcox is a 77 y.o. male with a PMHx significant for HTN, HFpEF, HLD, gout, and DM II, who is here today complaining of dizziness and for medication management. Last seen on 03/17/2023. He complains of recurrent episodes of dizziness. He states that after his last visit on 9/6, he became dizzy with severe nausea and vomiting while singing in his church choir. He had taken his flu shot that day.   Dizziness This is a new problem. The current episode started 1 to 4 weeks ago. The problem occurs intermittently. The problem has been unchanged. Pertinent negatives include no abdominal pain, change in bowel habit, chest pain, congestion, coughing, diaphoresis, fatigue, fever, headaches, neck pain, numbness, sore throat, swollen glands, urinary symptoms, visual change or weakness. He has tried nothing for the symptoms.   Last visit hydrochlorothiazide was changed to Chlorthalidone 25 mg 1/2 tab daily.  Currently, he say he has episodes of dizziness whenever he takes his medications, all at the same time and sometimes after eating. Because of this, he has not taken his Pioglitazone 15 mg daily for about 2 weeks, he thinks this medication is contributing to problem. It was added by his endocrinologist a few days after his last visit.  He describes the episodes as both a spinning sensation and lightheadedness, and says they last about 30 minutes or less. He denies associated nausea or vomiting. He denies headache, chest pain, SOB, palpitations,orthopnea, PND, changes in appetite, changes in bowel movements, or edema. No changes in hearing. He has checked his blood sugar when having episodes, and it has been 70-110. He has not been checking his BP at home. He checked when he was last at the pharmacy.   Lab  Results  Component Value Date   NA 136 03/15/2023   CL 97 03/15/2023   K 4.2 03/15/2023   CO2 29 03/15/2023   BUN 14 03/15/2023   CREATININE 1.09 03/15/2023   GFR 65.51 03/15/2023   CALCIUM 9.5 03/15/2023   ALBUMIN 4.3 11/10/2020   GLUCOSE 252 (H) 03/15/2023   Patient also complains of a blister on the top of his left foot that developed on 9/6 and worsened on 9/9.  He says the blister has been interfering with his sleep.   Additionally, we reviewed and discussed the patient's medications.   Review of Systems  Constitutional:  Negative for diaphoresis, fatigue and fever.  HENT:  Negative for congestion, ear discharge, ear pain and sore throat.   Respiratory:  Negative for cough.   Cardiovascular:  Negative for chest pain.  Gastrointestinal:  Negative for abdominal pain and change in bowel habit.  Endocrine: Negative for polydipsia, polyphagia and polyuria.  Genitourinary:  Negative for decreased urine volume, dysuria and hematuria.  Musculoskeletal:  Negative for neck pain.  Neurological:  Positive for dizziness. Negative for weakness, numbness and headaches.  Psychiatric/Behavioral:  Negative for confusion and hallucinations.   See other pertinent positives and negatives in HPI.  Current Outpatient Medications on File Prior to Visit  Medication Sig Dispense Refill   Alcohol Swabs (B-D SINGLE USE SWABS REGULAR) PADS Use as necessary 300 each 6   amLODipine (NORVASC) 10 MG tablet Take 1 tablet (10 mg total) by mouth daily. 90 tablet 3   aspirin 81 MG tablet Take  81 mg by mouth daily.     atorvastatin (LIPITOR) 40 MG tablet Take 1 tablet (40 mg total) by mouth daily. 90 tablet 1   Blood Glucose Monitoring Suppl (ONETOUCH VERIO IQ SYSTEM) w/Device KIT USE TO CHECK BLOOD SUGAR TWICE A DAY DX CODE E11.9 1 kit 2   chlorthalidone (HYGROTON) 25 MG tablet Take 0.5 tablets (12.5 mg total) by mouth daily. For blood pressure. 45 tablet 1   clobetasol ointment (TEMOVATE) 0.05 % Apply 1  application topically 2 (two) times daily. 30 g 0   colchicine 0.6 MG tablet Take 1 tablet (0.6 mg total) by mouth daily as needed (for gout attack.). 90 tablet 2   Continuous Glucose Sensor (FREESTYLE LIBRE 2 SENSOR) MISC 2 Devices by Does not apply route every 14 (fourteen) days. 6 each 3   Fluocinolone Acetonide Body 0.01 % OIL Apply 1 application  topically as directed.     furosemide (LASIX) 20 MG tablet TAKE 1 TABLET (20 MG TOTAL) BY MOUTH DAILY AS NEEDED FOR EDEMA. 90 tablet 2   glucose blood (ONETOUCH VERIO) test strip Check before meals and at bedtime. 300 strip 3   insulin aspart (NOVOLOG FLEXPEN) 100 UNIT/ML FlexPen Inject 20-22 Units into the skin 2 (two) times daily before a meal. E11.69 45 mL 1   insulin degludec (TRESIBA FLEXTOUCH) 100 UNIT/ML FlexTouch Pen Inject 36 Units into the skin daily. E11.69 45 mL 1   Insulin Pen Needle (B-D UF III MINI PEN NEEDLES) 31G X 5 MM MISC USE ONCE DAILY 100 each 3   metFORMIN (GLUCOPHAGE) 1000 MG tablet TAKE ONE TABLET BY MOUTH TWICE DAILY. TAKE WITH A MEAL. 180 tablet 1   metoprolol tartrate (LOPRESSOR) 100 MG tablet Take 1 tablet (100 mg total) by mouth 2 (two) times daily. 180 tablet 2   OneTouch Delica Lancets 33G MISC Use to check blood sugar twice daily. DX Code E11.9 200 each 1   pioglitazone (ACTOS) 15 MG tablet Take 1 tablet (15 mg total) by mouth daily. 90 tablet 3   ramipril (ALTACE) 10 MG capsule Take 1 capsule (10 mg total) by mouth daily. 90 capsule 2   sildenafil (VIAGRA) 50 MG tablet TAKE ONE TABLET BY MOUTH DAILY AS NEEDED FOR ERECTILE DYSFUNCTION 90 tablet 0   spironolactone (ALDACTONE) 25 MG tablet TAKE 1 TABLET BY MOUTH EVERY DAY FOR BLOOD PRESSURE 90 tablet 1   triamcinolone ointment (KENALOG) 0.5 % Apply 1 Application topically 2 (two) times daily. 30 g 0   No current facility-administered medications on file prior to visit.    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   PNEUMONIA, LEFT LOWER LOBE 09/08/2009   Rash    No Known Allergies  Social History   Socioeconomic History   Marital status: Widowed    Spouse name: Not on file   Number of children: 2   Years of education: Not on file   Highest education level: Not on file  Occupational History    Comment: Retired from [employer]  Tobacco Use   Smoking status: Never   Smokeless tobacco: Never  Vaping Use   Vaping status: Never Used  Substance and Sexual Activity   Alcohol use: No    Comment: stopped drinking etoh completely in  05-20-2000   Drug use: No   Sexual activity: Yes    Comment: uses viagra  Other Topics Concern   Not on file  Social History Narrative   Lives alone   Retired from [employer] in 05-20-2000   Twin sons, one son died of a stroke this year 2019-05-21) at age 37, his living son resides in [place]    His wife died of breast cancer 11 years ago    Social Determinants of Health   Financial Resource Strain: Low Risk  (02/02/2022)   Overall Financial Resource Strain (CARDIA)    Difficulty of Paying Living Expenses: Not hard at all  Food Insecurity: No Food Insecurity (02/02/2022)   Hunger Vital Sign    Worried About Running Out of Food in the Last Year: Never true    Ran Out of Food in the Last Year: Never true  Transportation Needs: No Transportation Needs (02/02/2022)   PRAPARE - Administrator, Civil Service (Medical): No    Lack of Transportation (Non-Medical): No  Physical Activity: Sufficiently Active (02/02/2022)   Exercise Vital Sign    Days of Exercise per Week: 7 days    Minutes of Exercise per Session: 150+ min  Stress: No Stress Concern Present (02/02/2022)   [employer] of Occupational Health - Occupational Stress Questionnaire    Feeling of Stress : Not at all  Social  Connections: Moderately Integrated (02/02/2022)   Social Connection and Isolation Panel [NHANES]    Frequency of Communication with Friends and Family: More than three times a week    Frequency of Social Gatherings with Friends and Family: More than three times a week    Attends Religious Services: More than 4 times per year    Active Member of [employer] or Organizations: Yes    Attends Banker Meetings: More than 4 times per year    Marital Status: Never married    Vitals:   04/11/23 1212  BP: 130/72  Pulse: 84  Resp: 16  Temp: 98.4 F (36.9 C)  SpO2: 99%   Body mass index is 28.9 kg/m.  Physical Exam Vitals and nursing note reviewed.  Constitutional:      General: He is not in acute distress.    Appearance: He is well-developed.  HENT:     Head: Normocephalic and atraumatic.     Mouth/Throat:     Mouth: Mucous membranes are moist.     Pharynx: Oropharynx is clear.  Eyes:     Conjunctiva/sclera: Conjunctivae normal.  Neck:     Vascular: No JVD.  Cardiovascular:     Rate and Rhythm: Normal rate and regular rhythm.     Pulses:          Posterior tibial pulses are 2+ on the right side and 2+ on the left side.     Heart sounds: Murmur (SEM I/VI RUSB) heard.  Pulmonary:     Effort: Pulmonary effort is normal. No respiratory distress.     Breath sounds: Normal breath sounds.  Abdominal:     Palpations: Abdomen is soft. There is no mass.     Tenderness: There is no abdominal tenderness.  Musculoskeletal:     Right lower leg: No edema.     Left lower leg: No edema.  Skin:    General: Skin is warm.     Findings: No erythema or rash.  Neurological:     Mental Status: He is alert and oriented to person, place, and time.  Cranial Nerves: No cranial nerve deficit.     Gait: Gait normal.  Psychiatric:        Mood and Affect: Mood and affect normal.   ASSESSMENT AND PLAN:  Mr. Lindaman was seen today for dizziness and medication management.  Lab Results   Component Value Date   NA 134 (L) 04/11/2023   CL 97 04/11/2023   K 4.5 04/11/2023   CO2 26 04/11/2023   BUN 36 (H) 04/11/2023   CREATININE 1.54 (H) 04/11/2023   GFR 43.25 (L) 04/11/2023   CALCIUM 10.1 04/11/2023   ALBUMIN 4.3 11/10/2020   GLUCOSE 175 (H) 04/11/2023   Lab Results  Component Value Date   WBC 12.8 (H) 04/11/2023   HGB 12.6 (L) 04/11/2023   HCT 38.4 (L) 04/11/2023   MCV 87.8 04/11/2023   PLT 260.0 04/11/2023   Dizziness We discussed possible etiologies. Reviewed each medication and indications. He was instructed to spread medications through the day instead taking all antihypertensive at the same time. Continue adequate hydration. Fall precautions. Instructed about warning signs. F/U in 6 weeks.  -     Basic metabolic panel; Future -     CBC; Future  Resistant hypertension Assessment & Plan: BP adequately controlled. Instructed to monitor BP at home. Recommend taking medications at different times,Chorthalidone 25 mg 1/2 tab daily and spironolactone 25 mg in am, Ramipril 10 mg and Amlodipine 10 mg at night. Continue Metoprolol tartrate 100 mg bid. Low salt diet.  Orders: -     Basic metabolic panel; Future  Type 2 diabetes mellitus with other specified complication, with long-term current use of insulin (HCC) Assessment & Plan: Problem is not well controlled, last HgA1C 8.6 on 03/15/23. Reporting a few BS's in the 70's. Continue monitoring BS's. Stopped Actos 15 mg, recommend resuming it and to call his endocrinologist's office of he has concerns about medication.  I spent a total of 42 minutes in both face to face and non face to face activities for this visit on the date of this encounter. During this time history was obtained and documented, examination was performed, prior labs reviewed, and assessment/plan discussed.  Return in about 6 weeks (around 05/23/2023).  I, Rolla Etienne Wilcox, acting as a scribe for Patrick Latendresse [place], MD., have documented all  relevant documentation on the behalf of Patrick Spraker [place], MD, as directed by  Patrick Alkhatib [place], MD while in the presence of Patrick Miner [place], MD.   I, Patrick Kiedrowski [place], MD, have reviewed all documentation for this visit. The documentation on 04/11/23 for the exam, diagnosis, procedures, and orders are all accurate and complete.  Patrick Lorensen G. [place], MD  [facility]. [place] office.

## 2023-04-11 NOTE — Assessment & Plan Note (Signed)
Problem is not well controlled, last HgA1C 8.6 on 03/15/23. Reporting a few BS's in the 70's. Continue monitoring BS's. Stopped Actos 15 mg, recommend resuming it and to call his endocrinologist's office of he has concerns about medication.

## 2023-04-11 NOTE — Assessment & Plan Note (Signed)
BP adequately controlled. Instructed to monitor BP at home. Recommend taking medications at different times,Chorthalidone 25 mg 1/2 tab daily and spironolactone 25 mg in am, Ramipril 10 mg and Amlodipine 10 mg at night. Continue Metoprolol tartrate 100 mg bid. Low salt diet.

## 2023-05-20 ENCOUNTER — Other Ambulatory Visit: Payer: Self-pay | Admitting: Family Medicine

## 2023-05-22 NOTE — Progress Notes (Signed)
HPI: Mr.Demareon E Woosley is a 77 y.o. male with a PMHx significant for HTN, HFpEF, HLD, gout, and DM II, who is here today for chronic disease management.  Last seen on 04/11/2023, when he was complaining of dizziness.  Dizziness:  Patient says his dizziness is significantly better since spreading out his medications.  Hypertension: He is on amlodipine 10 mg daily, metoprolol tartrate 100 mg bid, and spironolactone 25 mg.  BP readings at home: He has been checking his BP at home. His readings have been similar today.  Negative for unusual or severe headache, visual changes, exertional chest pain, dyspnea,  focal weakness, or edema. Component     Latest Ref Rng 04/11/2023  Sodium     135 - 145 mEq/L 134 (L)   Potassium     3.5 - 5.1 mEq/L 4.5   Chloride     96 - 112 mEq/L 97   CO2     19 - 32 mEq/L 26   Glucose     70 - 99 mg/dL 161 (H)   BUN     6 - 23 mg/dL 36 (H)   Creatinine     0.40 - 1.50 mg/dL 0.96 (H)   Calcium     8.4 - 10.5 mg/dL 04.5   GFR     >40.98 mL/min 43.25 (L)     Concerns today:   Left foot pain:  Patient also complains of a sore, achy pain dorsum of his left foot. He denies any swelling or redness.  He saw provider for this pain on 9/12, and was a prescribed a cream, not sure about name. He is unsure if it is helping.   Medicare wellness visit:  Independent ADLs and IADLs. He still drives.  He does not currently have a will or power of attorney.   Functional Status Survey: Is the patient deaf or have difficulty hearing?: No Does the patient have difficulty seeing, even when wearing glasses/contacts?: No Does the patient have difficulty concentrating, remembering, or making decisions?: No Does the patient have difficulty walking or climbing stairs?: No Does the patient have difficulty dressing or bathing?: No Does the patient have difficulty doing errands alone such as visiting a doctor's office or shopping?: No     05/23/2023    8:28 AM  03/23/2023    2:08 PM 03/17/2023   12:28 PM 09/12/2022    9:29 AM 02/02/2022   11:18 AM  Fall Risk   Falls in the past year? 0 0 0 0 0  Number falls in past yr: 0 0  0 0  Injury with Fall? 0 0  0 0  Risk for fall due to :  No Fall Risks No Fall Risks Other (Comment) No Fall Risks  Follow up Falls evaluation completed;Education provided   Falls evaluation completed    Providers patient sees regularly: cardiologist (Dr. Lalla Brothers, he did not show to his last appointment in 09/2021) and endocrinologist (Dr. Erroll Luna, last seen on 03/21/2023) Eye care provider: He doesn't currently have an eye care provider     05/23/2023    6:08 PM  Depression screen PHQ 2/9  Decreased Interest 0  Down, Depressed, Hopeless 0  PHQ - 2 Score 0    Mini-Cog - 05/23/23 0807     Normal clock drawing test? no    How many words correct? 3   2 words x 2, all 3 at the 3rd try.           Vision Screening  Right eye Left eye Both eyes  Without correction 20/25 20/25 20/25   With correction      Review of Systems  Constitutional:  Negative for activity change, appetite change and fever.  HENT:  Negative for nosebleeds, sore throat and trouble swallowing.   Respiratory:  Negative for cough and wheezing.   Gastrointestinal:  Negative for abdominal pain, nausea and vomiting.  Genitourinary:  Negative for decreased urine volume, dysuria and hematuria.  Musculoskeletal:  Positive for arthralgias.  Neurological:  Negative for syncope and facial asymmetry.  See other pertinent positives and negatives in HPI.  Current Outpatient Medications on File Prior to Visit  Medication Sig Dispense Refill   Alcohol Swabs (B-D SINGLE USE SWABS REGULAR) PADS Use as necessary 300 each 6   amLODipine (NORVASC) 10 MG tablet Take 1 tablet (10 mg total) by mouth daily. 90 tablet 3   aspirin 81 MG tablet Take 81 mg by mouth daily.     atorvastatin (LIPITOR) 40 MG tablet TAKE 1 TABLET BY MOUTH EVERY DAY 90 tablet 1   Blood Glucose  Monitoring Suppl (ONETOUCH VERIO IQ SYSTEM) w/Device KIT USE TO CHECK BLOOD SUGAR TWICE A DAY DX CODE E11.9 1 kit 2   chlorthalidone (HYGROTON) 25 MG tablet Take 0.5 tablets (12.5 mg total) by mouth daily. For blood pressure. 45 tablet 1   clobetasol ointment (TEMOVATE) 0.05 % Apply 1 application topically 2 (two) times daily. 30 g 0   colchicine 0.6 MG tablet Take 1 tablet (0.6 mg total) by mouth daily as needed (for gout attack.). 90 tablet 2   Continuous Glucose Sensor (FREESTYLE LIBRE 2 SENSOR) MISC 2 Devices by Does not apply route every 14 (fourteen) days. 6 each 3   Fluocinolone Acetonide Body 0.01 % OIL Apply 1 application  topically as directed.     furosemide (LASIX) 20 MG tablet TAKE 1 TABLET (20 MG TOTAL) BY MOUTH DAILY AS NEEDED FOR EDEMA. 90 tablet 2   glucose blood (ONETOUCH VERIO) test strip Check before meals and at bedtime. 300 strip 3   insulin aspart (NOVOLOG FLEXPEN) 100 UNIT/ML FlexPen Inject 20-22 Units into the skin 2 (two) times daily before a meal. E11.69 45 mL 1   insulin degludec (TRESIBA FLEXTOUCH) 100 UNIT/ML FlexTouch Pen Inject 36 Units into the skin daily. E11.69 45 mL 1   Insulin Pen Needle (B-D UF III MINI PEN NEEDLES) 31G X 5 MM MISC USE ONCE DAILY 100 each 3   metFORMIN (GLUCOPHAGE) 1000 MG tablet TAKE ONE TABLET BY MOUTH TWICE DAILY. TAKE WITH A MEAL. 180 tablet 1   metoprolol tartrate (LOPRESSOR) 100 MG tablet Take 1 tablet (100 mg total) by mouth 2 (two) times daily. 180 tablet 2   OneTouch Delica Lancets 33G MISC Use to check blood sugar twice daily. DX Code E11.9 200 each 1   pioglitazone (ACTOS) 15 MG tablet Take 1 tablet (15 mg total) by mouth daily. 90 tablet 3   ramipril (ALTACE) 10 MG capsule Take 1 capsule (10 mg total) by mouth daily. 90 capsule 2   sildenafil (VIAGRA) 50 MG tablet TAKE ONE TABLET BY MOUTH DAILY AS NEEDED FOR ERECTILE DYSFUNCTION 90 tablet 0   spironolactone (ALDACTONE) 25 MG tablet TAKE 1 TABLET BY MOUTH EVERY DAY FOR BLOOD PRESSURE  90 tablet 1   triamcinolone ointment (KENALOG) 0.5 % Apply 1 Application topically 2 (two) times daily. 30 g 0   No current facility-administered medications on file prior to visit.    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   PNEUMONIA, LEFT LOWER LOBE 09/08/2009   Rash    No Known Allergies  Social History   Socioeconomic History   Marital status: Widowed    Spouse name: Not on file   Number of children: 2   Years of education: Not on file   Highest education level: Not on file  Occupational History    Comment: Retired from [employer]  Tobacco Use   Smoking status: Never   Smokeless tobacco: Never  Vaping Use   Vaping status: Never Used  Substance and Sexual Activity   Alcohol use: No    Comment: stopped drinking etoh completely in June 04, 2000   Drug use: No   Sexual activity: Yes    Comment: uses viagra  Other Topics Concern   Not on file  Social History Narrative   Lives alone   Retired from [employer] in 2000/06/04   Twin sons, one son died of a stroke this year 06/05/19) at age 46, his living son resides in [place]    His wife died of breast cancer 11 years ago    Social Determinants of Health   Financial Resource Strain: Low Risk  (02/02/2022)   Overall Financial Resource Strain (CARDIA)    Difficulty of Paying Living Expenses: Not hard at all  Food Insecurity: No Food Insecurity (02/02/2022)   Hunger Vital Sign    Worried About Running Out of Food in the Last Year: Never true    Ran Out of Food in the Last Year: Never true  Transportation Needs: No Transportation Needs (02/02/2022)   PRAPARE - Administrator, Civil Service (Medical): No    Lack of Transportation (Non-Medical): No  Physical Activity: Sufficiently Active (02/02/2022)    Exercise Vital Sign    Days of Exercise per Week: 7 days    Minutes of Exercise per Session: 150+ min  Stress: No Stress Concern Present (02/02/2022)   [employer] of Occupational Health - Occupational Stress Questionnaire    Feeling of Stress : Not at all  Social Connections: Moderately Integrated (02/02/2022)   Social Connection and Isolation Panel [NHANES]    Frequency of Communication with Friends and Family: More than three times a week    Frequency of Social Gatherings with Friends and Family: More than three times a week    Attends Religious Services: More than 4 times per year    Active Member of Clubs or Organizations: Yes    Attends Banker Meetings: More than 4 times per year    Marital Status: Never married    Vitals:   05/23/23 0751  BP: 118/70  Pulse: 70  Resp: 16  Temp: 98.8 F (37.1 C)  SpO2: 98%   Body mass index is 29.33 kg/m.  Physical Exam Vitals and nursing note reviewed.  Constitutional:      General: He is not in acute distress.    Appearance: He is well-developed.  HENT:     Head: Normocephalic and atraumatic.     Mouth/Throat:     Mouth: Mucous membranes are moist.     Pharynx: Uvula midline.  Eyes:     Conjunctiva/sclera: Conjunctivae normal.  Cardiovascular:     Rate and Rhythm: Normal rate and regular rhythm.  Heart sounds: No murmur heard.    Comments: Difficult to find DP pulses. Pulmonary:     Effort: Pulmonary effort is normal. No respiratory distress.     Breath sounds: Normal breath sounds.  Abdominal:     Palpations: Abdomen is soft. There is no hepatomegaly or mass.     Tenderness: There is no abdominal tenderness.  Musculoskeletal:     Right lower leg: No edema.     Left lower leg: No edema.       Feet:  Feet:     Left foot:     Skin integrity: Ulcer present.  Lymphadenopathy:     Cervical: No cervical adenopathy.  Skin:    General: Skin is warm.     Findings: Lesion present. No erythema or rash.   Neurological:     General: No focal deficit present.     Mental Status: He is alert and oriented to person, place, and time.     Comments: Stable gait, not assisted.  Psychiatric:        Mood and Affect: Mood and affect normal.    ASSESSMENT AND PLAN:  Mr. Merlos was seen today for chronic follow up.   Orders Placed This Encounter  Procedures   Basic metabolic panel   Ambulatory referral to Dermatology   VAS [place] ABI WITH/WO TBI   Lab Results  Component Value Date   NA 135 05/23/2023   CL 99 05/23/2023   K 4.6 05/23/2023   CO2 27 05/23/2023   BUN 27 (H) 05/23/2023   CREATININE 1.36 05/23/2023   GFR 50.16 (L) 05/23/2023   CALCIUM 9.6 05/23/2023   ALBUMIN 4.3 11/10/2020   GLUCOSE 217 (H) 05/23/2023   Medicare annual wellness visit, subsequent [Z00.00] We discussed the importance of staying active, physically and mentally, as well as the benefits of a healthy/balance diet. Low impact exercise that involve stretching and strengthing are ideal. Vaccines up-to-date. We discussed preventive screening for the next 5-10 years, summery of recommendations given in AVS. Fall prevention. Advance directives and end of life discussed and recommended, form printed and given to pt.   Resistant hypertension Assessment & Plan: BP improved, reporting similar readings at home. Continue monitoring BP at home regularly. Continue amlodipine 10 mg daily, metoprolol titrate 100 mg twice daily, spironolactone 25 mg daily, and chlorthalidone 25 mg 1/2 tablet daily. Low-salt diet also recommended. He is due for eye exam.  Orders: -     Basic metabolic panel; Future  Dizziness He is reporting great improvement. I do not think further workup is needed at this time. Monitor for new symptoms. Instructed about warning signs.  Ulcer of left foot, limited to breakdown of skin (HCC) Noted skin lesion on examination today, which he has had for about 1 to 2 months. Recommend keeping area clean  with soap and water and avoid pressure on area. Recommend dermatology consultation to determine the need for biopsy, SCC is in the differential Dx. -     Ambulatory referral to Dermatology  Decreased pulses in feet DP pulses difficult to find today. ABI will be arranged. Continue appropriate footcare.  -     VAS [place] ABI WITH/WO TBI; Future  Return in about 6 months (around 11/20/2023) for chronic problems.  I, Suanne Marker, acting as a scribe for Merrit Friesen [place], MD., have documented all relevant documentation on the behalf of Dwain Huhn [place], MD, as directed by  Cylus Douville [place], MD while in the presence of Chere Babson [place], MD.   I,  Saeed Toren [place], MD, have reviewed all documentation for this visit. The documentation on 05/23/23 for the exam, diagnosis, procedures, and orders are all accurate and complete.  Bethann Qualley G. [place], MD  [facility]. [place] office.

## 2023-05-23 ENCOUNTER — Ambulatory Visit (INDEPENDENT_AMBULATORY_CARE_PROVIDER_SITE_OTHER): Payer: PPO | Admitting: Family Medicine

## 2023-05-23 ENCOUNTER — Encounter: Payer: Self-pay | Admitting: Family Medicine

## 2023-05-23 VITALS — BP 118/70 | HR 70 | Temp 98.8°F | Resp 16 | Ht 72.0 in | Wt 216.2 lb

## 2023-05-23 DIAGNOSIS — R42 Dizziness and giddiness: Secondary | ICD-10-CM

## 2023-05-23 DIAGNOSIS — L97521 Non-pressure chronic ulcer of other part of left foot limited to breakdown of skin: Secondary | ICD-10-CM

## 2023-05-23 DIAGNOSIS — I1A0 Resistant hypertension: Secondary | ICD-10-CM | POA: Diagnosis not present

## 2023-05-23 DIAGNOSIS — R0989 Other specified symptoms and signs involving the circulatory and respiratory systems: Secondary | ICD-10-CM | POA: Diagnosis not present

## 2023-05-23 DIAGNOSIS — Z0000 Encounter for general adult medical examination without abnormal findings: Secondary | ICD-10-CM | POA: Diagnosis not present

## 2023-05-23 LAB — BASIC METABOLIC PANEL
BUN: 27 mg/dL — ABNORMAL HIGH (ref 6–23)
CO2: 27 meq/L (ref 19–32)
Calcium: 9.6 mg/dL (ref 8.4–10.5)
Chloride: 99 meq/L (ref 96–112)
Creatinine, Ser: 1.36 mg/dL (ref 0.40–1.50)
GFR: 50.16 mL/min — ABNORMAL LOW (ref >60.00)
Glucose, Bld: 217 mg/dL — ABNORMAL HIGH (ref 70–99)
Potassium: 4.6 meq/L (ref 3.5–5.1)
Sodium: 135 meq/L (ref 135–145)

## 2023-05-23 NOTE — Patient Instructions (Addendum)
  [name] , Thank you for taking time to come for your Medicare Wellness Visit. I appreciate your ongoing commitment to your health goals. Please review the following plan we discussed and let me know if I can assist you in the future.   These are the goals we discussed:  Goals       Exercise 3x per week (30 min per time)      Walk 10 min daily.                        Complete advance directives form.  This is a list of the screening recommended for you and due dates:  Health Maintenance  Topic Date Due   Eye exam for diabetics  10/12/2019   COVID-19 Vaccine (4 - 2023-24 season) 06/08/2023*   Hemoglobin A1C  09/12/2023   Yearly kidney health urinalysis for diabetes  09/19/2023   Complete foot exam   09/22/2023   Yearly kidney function blood test for diabetes  04/10/2024   Medicare Annual Wellness Visit  05/22/2024   Pneumonia Vaccine  Completed   Flu Shot  Completed   Hepatitis C Screening  Completed   Zoster (Shingles) Vaccine  Completed   HPV Vaccine  Aged Out   DTaP/Tdap/Td vaccine  Discontinued   Colon Cancer Screening  Discontinued  *Topic was postponed. The date shown is not the original due date.    A few things to remember from today's visit:  Resistant hypertension - Plan: Basic metabolic panel  Dizziness  Ulcer of left foot, limited to breakdown of skin (HCC) - Plan: Ambulatory referral to Dermatology  Decreased pulses in feet - Plan: VAS [place] ABI WITH/WO TBI  Medicare annual wellness visit, subsequent [Z00.00] Appt with dermatologist and for circulation test will be arranged. Keep lesion clean with soap and water and avoid irritation.  If you need refills for medications you take chronically, please call your pharmacy. Do not use My Chart to request refills or for acute issues that need immediate attention. If you send a my chart message, it may take a few days to be addressed, specially if I am not in the office.  Please be sure medication list is  accurate. If a new problem present, please set up appointment sooner than planned today.

## 2023-05-23 NOTE — Assessment & Plan Note (Signed)
BP improved, reporting similar readings at home. Continue monitoring BP at home regularly. Continue amlodipine 10 mg daily, metoprolol titrate 100 mg twice daily, spironolactone 25 mg daily, and chlorthalidone 25 mg 1/2 tablet daily. Low-salt diet also recommended. He is due for eye exam.

## 2023-06-12 ENCOUNTER — Other Ambulatory Visit: Payer: Self-pay

## 2023-06-12 DIAGNOSIS — E1169 Type 2 diabetes mellitus with other specified complication: Secondary | ICD-10-CM

## 2023-06-13 ENCOUNTER — Other Ambulatory Visit: Payer: PPO

## 2023-06-13 ENCOUNTER — Telehealth (HOSPITAL_COMMUNITY): Payer: Self-pay

## 2023-06-13 ENCOUNTER — Encounter (HOSPITAL_COMMUNITY): Payer: Self-pay

## 2023-06-18 ENCOUNTER — Other Ambulatory Visit: Payer: Self-pay | Admitting: Internal Medicine

## 2023-06-18 DIAGNOSIS — E1169 Type 2 diabetes mellitus with other specified complication: Secondary | ICD-10-CM

## 2023-06-20 ENCOUNTER — Ambulatory Visit (INDEPENDENT_AMBULATORY_CARE_PROVIDER_SITE_OTHER): Payer: PPO | Admitting: Endocrinology

## 2023-06-20 ENCOUNTER — Telehealth: Payer: Self-pay

## 2023-06-20 ENCOUNTER — Encounter: Payer: Self-pay | Admitting: Endocrinology

## 2023-06-20 ENCOUNTER — Ambulatory Visit: Payer: PPO | Admitting: Endocrinology

## 2023-06-20 VITALS — BP 170/110 | HR 79 | Resp 20 | Ht 72.0 in | Wt 215.4 lb

## 2023-06-20 DIAGNOSIS — E1165 Type 2 diabetes mellitus with hyperglycemia: Secondary | ICD-10-CM

## 2023-06-20 DIAGNOSIS — Z794 Long term (current) use of insulin: Secondary | ICD-10-CM | POA: Diagnosis not present

## 2023-06-20 DIAGNOSIS — E1169 Type 2 diabetes mellitus with other specified complication: Secondary | ICD-10-CM

## 2023-06-20 LAB — POCT GLYCOSYLATED HEMOGLOBIN (HGB A1C): Hemoglobin A1C: 7.5 % — AB (ref 4.0–5.6)

## 2023-06-20 MED ORDER — TRESIBA FLEXTOUCH 100 UNIT/ML ~~LOC~~ SOPN: 34.0000 [IU] | PEN_INJECTOR | Freq: Every day | SUBCUTANEOUS | 1 refills | Status: DC

## 2023-06-20 NOTE — Progress Notes (Signed)
Outpatient Endocrinology Note [place] Patrick Lambrecht, MD  06/20/23  Patient's Name: Patrick Wilcox    DOB: 1946-06-13    MRN: [id number]                                                    REASON OF VISIT: Follow up for type 2 diabetes mellitus  PCP: [place], [name], MD  HISTORY OF PRESENT ILLNESS:   Patrick Wilcox is a 77 y.o. old male with past medical history listed below, is here for  follow up of type 2 diabetes mellitus.   Pertinent Diabetes History: Patient was diagnosed with type 2 diabetes mellitus in 2004.  He was initially treated with insulin and subsequently switched back to oral medications.  He had been on and off of the insulin in the past.  He was on Amaryl and Januvia in the past per record.  He was also on premixed insulin in the past which was switched to Lantus and Humalog later.  He had seen diabetic educator had lifestyle changes and had better control of diabetes however overall he has uncontrolled diabetes mellitus.  Chronic Diabetes Complications : Retinopathy: Unknown. Last ophthalmology exam was done on Due, referred to ophthalmology. Nephropathy: no, on ramipril.  History of microalbuminuria. Peripheral neuropathy: yes Coronary artery disease: yes Stroke: no  Relevant comorbidities and cardiovascular risk factors: Obesity: no Body mass index is 29.21 kg/m.  Hypertension: yes Hyperlipidemia. Yes, on statin  Current / Home Diabetic regimen includes: Tresiba 36 units daily.  NovoLog 20 twice a day. Metformin 1000 mg 2 times a day. Pioglitazone/Actos 15 mg daily.  Prior diabetic medications: Amaryl/glimepiride and Januvia.  Glycemic data:   He had used freestyle libre CGM in the past, did not like the idea of alarms, in the accuracy of the reading at times, required to change often.  Amarius's glucose meter is Location manager. Raw data and trends analyzed.  One Touch Verio glucometer.  From November 26 to June 20, 2023. -  He has been testing his  blood glucoses 1 time daily, in the morning fasting.  -  Average glucose for the last 14 days is 99 mg/dl.  Mostly fasting sugar in the range of 90-130.  He had rare hypoglycemia with blood sugar 48 and 49 on December 2 and December 1.  Some of the other time he had low blood sugar of 72.  Rarely high blood sugar of 171.   Hypoglycemia: Patient has minor hypoglycemic episodes. Patient has hypoglycemia awareness.  Factors modifying glucose control: 1.  Diabetic diet assessment: two meals a day.   2.  Staying active or exercising: walking.   3.  Medication compliance: compliant all of the time.  Interval history  Patient had left foot ulcer on the dorsum, following with PCP and dermatology.  Glucometer data as reviewed above.  Diabetes regimen as noted above.  He had 2 occasional hypoglycemia in the early morning as noted above.  No other complaints today.  Hemoglobin A1c improved to 7.5% today.  REVIEW OF SYSTEMS As per history of present illness.   PAST MEDICAL HISTORY: Past Medical History:  Diagnosis Date   Cataract    had surgery to remove them   CORONARY ARTERY DISEASE 11/23/2006   DIABETES MELLITUS, TYPE II 11/23/2006   GOUT 05/20/2008   HYPERLIPIDEMIA 11/23/2006   HYPERTENSION  11/23/2006   MYOCARDIAL INFARCTION, HX OF 11/22/2001   PNEUMONIA, LEFT LOWER LOBE 09/08/2009   Rash     PAST SURGICAL HISTORY: Past Surgical History:  Procedure Laterality Date   CORONARY ANGIOPLASTY WITH STENT PLACEMENT     CORONARY ARTERY BYPASS GRAFT  06/26/2002    ALLERGIES: No Known Allergies  FAMILY HISTORY:  Family History  Problem Relation Age of Onset   Heart disease Mother    Heart disease Father    Diabetes Sister    Diabetes Brother     SOCIAL HISTORY: Social History   Socioeconomic History   Marital status: Widowed    Spouse name: Not on file   Number of children: 2   Years of education: Not on file   Highest education level: Not on file  Occupational History    Comment:  Retired from [employer]  Tobacco Use   Smoking status: Never   Smokeless tobacco: Never  Vaping Use   Vaping status: Never Used  Substance and Sexual Activity   Alcohol use: No    Comment: stopped drinking etoh completely in 06/26/2000   Drug use: No   Sexual activity: Yes    Comment: uses viagra  Other Topics Concern   Not on file  Social History Narrative   Lives alone   Retired from [employer] in 06-26-2000   Twin sons, one son died of a stroke this year 2019-06-27) at age 51, his living son resides in [place]    His wife died of breast cancer 11 years ago    Social Determinants of Health   Financial Resource Strain: Low Risk  (02/02/2022)   Overall Financial Resource Strain (CARDIA)    Difficulty of Paying Living Expenses: Not hard at all  Food Insecurity: No Food Insecurity (02/02/2022)   Hunger Vital Sign    Worried About Running Out of Food in the Last Year: Never true    Ran Out of Food in the Last Year: Never true  Transportation Needs: No Transportation Needs (02/02/2022)   PRAPARE - Administrator, Civil Service (Medical): No    Lack of Transportation (Non-Medical): No  Physical Activity: Sufficiently Active (02/02/2022)   Exercise Vital Sign    Days of Exercise per Week: 7 days    Minutes of Exercise per Session: 150+ min  Stress: No Stress Concern Present (02/02/2022)   [employer] of Occupational Health - Occupational Stress Questionnaire    Feeling of Stress : Not at all  Social Connections: Moderately Integrated (02/02/2022)   Social Connection and Isolation Panel [NHANES]    Frequency of Communication with Friends and Family: More than three times a week    Frequency of Social Gatherings with Friends and Family: More than three times a week    Attends Religious Services: More than 4 times per year    Active Member of [employer] or Organizations: Yes    Attends Engineer, structural: More than 4 times per year    Marital Status: Never married     MEDICATIONS:  Current Outpatient Medications  Medication Sig Dispense Refill   Alcohol Swabs (B-D SINGLE USE SWABS REGULAR) PADS Use as necessary 300 each 6   amLODipine (NORVASC) 10 MG tablet Take 1 tablet (10 mg total) by mouth daily. 90 tablet 3   aspirin 81 MG tablet Take 81 mg by mouth daily.     atorvastatin (LIPITOR) 40 MG tablet TAKE 1 TABLET BY MOUTH EVERY DAY 90 tablet 1   Blood Glucose Monitoring  Suppl (ONETOUCH VERIO IQ SYSTEM) w/Device KIT USE TO CHECK BLOOD SUGAR TWICE A DAY DX CODE E11.9 1 kit 2   chlorthalidone (HYGROTON) 25 MG tablet Take 0.5 tablets (12.5 mg total) by mouth daily. For blood pressure. 45 tablet 1   clobetasol ointment (TEMOVATE) 0.05 % Apply 1 application topically 2 (two) times daily. 30 g 0   colchicine 0.6 MG tablet Take 1 tablet (0.6 mg total) by mouth daily as needed (for gout attack.). 90 tablet 2   Continuous Glucose Sensor (FREESTYLE LIBRE 2 SENSOR) MISC 2 Devices by Does not apply route every 14 (fourteen) days. 6 each 3   Fluocinolone Acetonide Body 0.01 % OIL Apply 1 application  topically as directed.     furosemide (LASIX) 20 MG tablet TAKE 1 TABLET (20 MG TOTAL) BY MOUTH DAILY AS NEEDED FOR EDEMA. 90 tablet 2   glucose blood (ONETOUCH VERIO) test strip Check before meals and at bedtime. 300 strip 3   insulin aspart (NOVOLOG FLEXPEN) 100 UNIT/ML FlexPen Inject 20-22 Units into the skin 2 (two) times daily before a meal. E11.69 45 mL 1   Insulin Pen Needle (B-D UF III MINI PEN NEEDLES) 31G X 5 MM MISC USE ONCE DAILY 100 each 3   metFORMIN (GLUCOPHAGE) 1000 MG tablet TAKE ONE TABLET BY MOUTH TWICE DAILY. TAKE WITH A MEAL. 180 tablet 1   metoprolol tartrate (LOPRESSOR) 100 MG tablet Take 1 tablet (100 mg total) by mouth 2 (two) times daily. 180 tablet 2   OneTouch Delica Lancets 33G MISC Use to check blood sugar twice daily. DX Code E11.9 200 each 1   pioglitazone (ACTOS) 15 MG tablet Take 1 tablet (15 mg total) by mouth daily. 90 tablet 3    ramipril (ALTACE) 10 MG capsule Take 1 capsule (10 mg total) by mouth daily. 90 capsule 2   sildenafil (VIAGRA) 50 MG tablet TAKE ONE TABLET BY MOUTH DAILY AS NEEDED FOR ERECTILE DYSFUNCTION 90 tablet 0   spironolactone (ALDACTONE) 25 MG tablet TAKE 1 TABLET BY MOUTH EVERY DAY FOR BLOOD PRESSURE 90 tablet 1   triamcinolone ointment (KENALOG) 0.5 % Apply 1 Application topically 2 (two) times daily. 30 g 0   insulin degludec (TRESIBA FLEXTOUCH) 100 UNIT/ML FlexTouch Pen Inject 34 Units into the skin daily. E11.69 45 mL 1   No current facility-administered medications for this visit.    PHYSICAL EXAM: Vitals:   06/20/23 1056 06/20/23 1058  BP: (!) 146/100 (!) 170/110  Pulse: 79   Resp: 20   SpO2: 97%   Weight: 215 lb 6.4 oz (97.7 kg)   Height: 6' (1.829 m)     Body mass index is 29.21 kg/m.  Wt Readings from Last 3 Encounters:  06/20/23 215 lb 6.4 oz (97.7 kg)  05/23/23 216 lb 4 oz (98.1 kg)  04/11/23 213 lb 2 oz (96.7 kg)    General: Well developed, well nourished male in no apparent distress.  HEENT: AT/[place], no external lesions.  Eyes: Conjunctiva clear and no icterus. Neck: Neck supple  Lungs: Respirations not labored Neurologic: Alert, oriented, normal speech Extremities / Skin: Dry.  Left foot dorsum ulcer, no discharge, no erythema.  Psychiatric: Does not appear depressed or anxious  Diabetic Foot Exam - Simple   No data filed    LABS Reviewed Lab Results  Component Value Date   HGBA1C 7.5 (A) 06/20/2023   HGBA1C 8.6 (H) 03/15/2023   HGBA1C 8.4 (H) 12/20/2022   Lab Results  Component Value Date  FRUCTOSAMINE 305 (H) 06/01/2020   FRUCTOSAMINE 296 (H) 12/31/2019   FRUCTOSAMINE 315 (H) 09/09/2019   Lab Results  Component Value Date   CHOL 102 11/10/2020   HDL 27.70 (L) 11/10/2020   LDLCALC 48 11/10/2020   LDLDIRECT 71.0 02/28/2022   TRIG 129.0 11/10/2020   CHOLHDL 4 11/10/2020   Lab Results  Component Value Date   MICRALBCREAT 7.0 09/19/2022    MICRALBCREAT 39.9 (H) 11/25/2021   Lab Results  Component Value Date   CREATININE 1.36 05/23/2023   Lab Results  Component Value Date   GFR 50.16 (L) 05/23/2023    ASSESSMENT / PLAN  1. Uncontrolled type 2 diabetes mellitus with hyperglycemia, with long-term current use of insulin (HCC)   2. Type 2 diabetes mellitus insulin-dependent      Diabetes Mellitus type 2, complicated by CAD /diabetic foot ulcer/history of microalbuminuria. - Diabetic status / severity: Uncontrolled  Lab Results  Component Value Date   HGBA1C 7.5 (A) 06/20/2023    - Hemoglobin A1c goal : <7%  Diabetes control improving.  - Medications: See below,   Diabetes regimen Decreased Tresiba from 36 to 34 units daily. Continue NovoLog 20 units with meals 2 times a day.  Discussed about decreasing dose of NovoLog if not eating full meal to avoid hypoglycemia. Continue metformin 1000 mg 2 times a day. Continue Actos/pioglitazone 15 mg daily.  Check blood sugar in the morning fasting and at bedtime at least twice a day.  - Discussed/ Gave Hypoglycemia treatment plan.  # Consult : not required at this time.   # Annual urine for microalbuminuria/ creatinine ratio, no microalbuminuria currently, continue ACE/ARB /ramipril. Last  Lab Results  Component Value Date   MICRALBCREAT 7.0 09/19/2022    # Foot check nightly / neuropathy.  # Annual dilated diabetic eye exams.  Was referred to ophthalmology in last visit in June.  - Diet: Make healthy diabetic food choices - Life style / activity / exercise: Discussed.  2. Blood pressure  -  BP Readings from Last 1 Encounters:  06/20/23 (!) 170/110    - Control is not in target. Has not taken BP medications today.  Advised to be compliant with blood pressure medication and monitor blood pressure at home.  He is asymptomatic denies headache, chest pain, shortness of breath.  If remained elevated asked to contact primary care provider. - No change in  current plans.  3. Lipid status / Hyperlipidemia - Last  Lab Results  Component Value Date   LDLCALC 48 11/10/2020   - Continue continue atorvastatin 40 mg daily.  Managed by primary care provider/cardiology.  Diagnoses and all orders for this visit:  Uncontrolled type 2 diabetes mellitus with hyperglycemia, with long-term current use of insulin (HCC) -     POCT glycosylated hemoglobin (Hb A1C) -     Microalbumin / creatinine urine ratio -     Basic metabolic panel -     Hemoglobin A1c  Type 2 diabetes mellitus insulin-dependent -     insulin degludec (TRESIBA FLEXTOUCH) 100 UNIT/ML FlexTouch Pen; Inject 34 Units into the skin daily. E11.69     DISPOSITION Follow up in clinic in 3 months suggested.   All questions answered and patient verbalized understanding of the plan.  [place] Doss Cybulski, MD [facility] [address] [place], [address] [place], [place] [address] Phone # [phone]  At least part of this note was generated using voice recognition software. Inadvertent word errors may have occurred, which  were not recognized during the proofreading process.

## 2023-06-20 NOTE — Telephone Encounter (Signed)
During appointment BP elevated, patient asymptomatic, and states he has not taken his medications this morning which is inclusive of his BP meds.

## 2023-06-20 NOTE — Patient Instructions (Signed)
Diabetes regimen Decrease Tresiba 34 units daily. Continue NovoLog 20 units with meals 2 times a day. Adjust based on meal, not more than 20 units at at time.  Continue metformin 1000 mg 2 times a day. Continue Actos/pioglitazone 15 mg daily.  Check blood sugar in the morning fasting and at bedtime at least twice a day.

## 2023-07-17 ENCOUNTER — Other Ambulatory Visit: Payer: Self-pay | Admitting: Endocrinology

## 2023-07-17 DIAGNOSIS — E1169 Type 2 diabetes mellitus with other specified complication: Secondary | ICD-10-CM

## 2023-07-17 MED ORDER — TRESIBA FLEXTOUCH 100 UNIT/ML ~~LOC~~ SOPN: 34.0000 [IU] | PEN_INJECTOR | Freq: Every day | SUBCUTANEOUS | 1 refills | Status: DC

## 2023-07-19 ENCOUNTER — Other Ambulatory Visit: Payer: Self-pay | Admitting: Endocrinology

## 2023-07-19 ENCOUNTER — Other Ambulatory Visit: Payer: Self-pay | Admitting: Internal Medicine

## 2023-07-19 DIAGNOSIS — E1169 Type 2 diabetes mellitus with other specified complication: Secondary | ICD-10-CM

## 2023-07-20 ENCOUNTER — Telehealth: Payer: Self-pay | Admitting: Family Medicine

## 2023-07-20 DIAGNOSIS — I5032 Chronic diastolic (congestive) heart failure: Secondary | ICD-10-CM

## 2023-07-20 DIAGNOSIS — I1A0 Resistant hypertension: Secondary | ICD-10-CM

## 2023-07-20 NOTE — Telephone Encounter (Signed)
 Pt needs 90-day refills for Furosemide  20 mg, Chlorthalidone  25 mg, Amlodipine  Besylate 10 mg, Ramipril  10 mg, NovoLoh FlexPen and Tresiba  FlexTouch and needle tips for pens.  He went to the pharmacy and they said his prescription had just expired.  [facility]acy- [facility] [place] [facility]

## 2023-07-21 MED ORDER — ATORVASTATIN CALCIUM 40 MG PO TABS: 40.0000 mg | ORAL_TABLET | Freq: Every day | ORAL | 1 refills | Status: DC

## 2023-07-21 MED ORDER — RAMIPRIL 10 MG PO CAPS: 10.0000 mg | ORAL_CAPSULE | Freq: Every day | ORAL | 2 refills | Status: DC

## 2023-07-21 MED ORDER — CHLORTHALIDONE 25 MG PO TABS: 12.5000 mg | ORAL_TABLET | Freq: Every day | ORAL | 1 refills | Status: DC

## 2023-07-21 MED ORDER — FUROSEMIDE 20 MG PO TABS: 20.0000 mg | ORAL_TABLET | Freq: Every day | ORAL | 2 refills | Status: DC | PRN

## 2023-07-21 MED ORDER — AMLODIPINE BESYLATE 10 MG PO TABS: 10.0000 mg | ORAL_TABLET | Freq: Every day | ORAL | 3 refills | Status: DC

## 2023-07-21 NOTE — Telephone Encounter (Signed)
 Rx's sent. All diabetes meds were sent by pt's endocrinologist on 1/6.

## 2023-08-07 ENCOUNTER — Telehealth: Payer: Self-pay

## 2023-08-07 NOTE — Progress Notes (Signed)
Care Guide Pharmacy Note  08/07/2023 Name: Patrick Wilcox MRN: [id number] DOB: 10/16/1945  Referred By: [place], [name], MD Reason for referral: Care Coordination (TNM Diabetes. )   Patrick Wilcox is a 78 y.o. year old male who is a primary care patient of [place], Timoteo Expose, MD.  [name] was referred to the pharmacist for assistance related to: DMII  Successful contact was made with the patient to discuss pharmacy services including being ready for the pharmacist to call at least 5 minutes before the scheduled appointment time and to have medication bottles and any blood pressure readings ready for review. The patient agreed to meet with the pharmacist via telephone visit on (date/time). 08/09/23 at 10:00 a.m.   [facility]  [facility], [facility] Health Care Management Assistant Direct Dial: [phone]  Fax: [phone]

## 2023-08-09 ENCOUNTER — Telehealth: Payer: Self-pay

## 2023-08-09 ENCOUNTER — Other Ambulatory Visit: Payer: Self-pay

## 2023-08-09 DIAGNOSIS — Z794 Long term (current) use of insulin: Secondary | ICD-10-CM

## 2023-08-09 DIAGNOSIS — I1A0 Resistant hypertension: Secondary | ICD-10-CM

## 2023-08-09 MED ORDER — CHLORTHALIDONE 25 MG PO TABS: 12.5000 mg | ORAL_TABLET | Freq: Every day | ORAL | 1 refills | Status: DC

## 2023-08-09 NOTE — Progress Notes (Cosign Needed)
08/09/2023 Name: Patrick Wilcox MRN: [id number] DOB: Sep 21, 1945  Chief Complaint  Patient presents with   Diabetes   Hypertension   Medication Management    Patrick Wilcox is a 78 y.o. year old male who presented for a telephone visit.   They were referred to the pharmacist by their PCP for assistance in managing diabetes, hypertension, and complex medication management.    Subjective:  Care Team: Primary Care Provider: [place], [name], MD ; Next Scheduled Visit: 08/18/23  Medication Access/Adherence  [facility]  [id number] [place], [place] - [address] AT [facility] [address] [place] [address] Phone: [phone] Fax: [phone]  [facility] [id number] - [place], [place] - [address] [facility] [address] [facility] [address] [place] [address] Phone: [phone] Fax: [phone]  [facility] (New Address) - [place], [place] - [address] [facility] AT Previously: [place], [place] [address] [place] [address] Phone: [phone] Fax: [phone]  [phone] [place], [place] - [address] AT CORNER OF [facility] [address] [place] [address] Phone: [phone] Fax: [phone]   Patient reports affordability concerns with their medications: No  Patient reports access/transportation concerns to their pharmacy: No  Patient reports adherence concerns with their medications:  No     Diabetes:  Current medications: Metformin 1000mg  BID, Tresiba 34 units, Novolog 20-22 units BID, Actos 15mg  daily,  Medications tried in the past: Glimepiride, Januvia  Current glucose readings: denies any readings above 140, reports highest of 132 in the last month. One low of 65 in last 30 days, corrected with a piece of candy  Checks once daily, fasting  Patient denies hypoglycemic s/sx including  dizziness, shakiness, sweating. Patient denies hyperglycemic symptoms including polyuria, polydipsia, polyphagia, nocturia, neuropathy, blurred vision.   Hypertension:  Current medications: Ramipril 10mg  1 capsule daily, Spironolactone 25mg , Metoprolol tartrate 100mg  BID, Lasix 20mg  once daily prn edema, Chlorthalidone 25mg  1/2 tab daily, Amlodipine 10mg   Medications previously tried: HCTZ  Patient has a validated, automated, upper arm home BP cuff Current blood pressure readings readings: Uses home monitor and checks every time he is at drug store, reports this usually happens at least 1-2x every couple weeks, cannot recall any readings though, said he "felt like they looked good" but wasn't sure  Patient denies hypotensive s/sx including dizziness, lightheadedness.  Patient denies hypertensive symptoms including headache, chest pain, shortness of breath    Hyperlipidemia/ASCVD Risk Reduction  Current lipid lowering medications: Atorvastatin 40mg  1 qd Medications tried in the past: None    Objective:  Lab Results  Component Value Date   HGBA1C 7.5 (A) 06/20/2023    Lab Results  Component Value Date   CREATININE 1.36 05/23/2023   BUN 27 (H) 05/23/2023   NA 135 05/23/2023   K 4.6 05/23/2023   CL 99 05/23/2023   CO2 27 05/23/2023    Lab Results  Component Value Date   CHOL 102 11/10/2020   HDL 27.70 (L) 11/10/2020   LDLCALC 48 11/10/2020   LDLDIRECT 71.0 02/28/2022   TRIG 129.0 11/10/2020   CHOLHDL 4 11/10/2020    Medications Reviewed Today     Reviewed by Sherrill Raring, RPH (Pharmacist) on 08/09/23 at 1018  Med List Status: <None>   Medication Order Taking? Sig Documenting Provider Last Dose Status Informant  Alcohol Swabs (B-D SINGLE USE SWABS REGULAR) PADS [id number]  Use as necessary Reather Littler, MD  Active   amLODipine (NORVASC) 10 MG tablet [id number] Yes Take 1 tablet (10 mg total) by mouth daily. [place], [name], MD Taking Active   aspirin 81 MG  tablet [id number] Yes Take 81 mg by mouth daily. [provider] Taking Active   atorvastatin (LIPITOR) 40 MG tablet [id number] Yes Take 1 tablet (40 mg total) by mouth daily. [place], [name], MD Taking Active   Blood Glucose Monitoring Suppl [facility] VERIO IQ SYSTEM) w/Device Andria Rhein [id number]  USE TO CHECK BLOOD SUGAR TWICE A DAY DX CODE E11.9 Reather Littler, MD  Active   chlorthalidone (HYGROTON) 25 MG tablet [id number] Yes Take 0.5 tablets (12.5 mg total) by mouth daily. For blood pressure. [place], [name], MD Taking Active   colchicine 0.6 MG tablet [id number] Yes Take 1 tablet (0.6 mg total) by mouth daily as needed (for gout attack.). [place], [name], MD Taking Active   Continuous Glucose Sensor (FREESTYLE LIBRE 2 SENSOR) [place] [id number]  2 Devices by Does not apply route every 14 (fourteen) days. Reather Littler, MD  Active   furosemide (LASIX) 20 MG tablet [id number] Yes Take 1 tablet (20 mg total) by mouth daily as needed for edema. [place], [name], MD Taking Active   glucose blood [facility] VERIO) test strip [id number]  Check before meals and at bedtime. [place], [place], MD  Active   insulin aspart (NOVOLOG FLEXPEN) 100 UNIT/ML FlexPen [id number] Yes INJECT 20-22 UNITS INTO THE SKIN 2 (TWO) TIMES DAILY BEFORE A MEAL. E11.69 [place], [place], MD Taking Active   insulin degludec (TRESIBA FLEXTOUCH) 100 UNIT/ML FlexTouch Pen [id number] Yes Inject 34 Units into the skin daily. E11.69 [place], [place], MD Taking Active   Insulin Pen Needle (B-D UF III MINI PEN NEEDLES) 31G X 5 MM MISC [id number]  USE ONCE DAILY Reather Littler, MD  Active   metFORMIN (GLUCOPHAGE) 1000 MG tablet [id number] Yes TAKE ONE TABLET BY MOUTH TWICE DAILY. TAKE WITH A MEAL. [place], [place], MD Taking Active   metoprolol tartrate (LOPRESSOR) 100 MG tablet [id number] Yes Take 1 tablet (100 mg total) by mouth 2 (two) times daily. [place], [name], MD Taking Active   [facility] Lancets 33G [place] [id number]  Use to check blood sugar twice daily. DX Code  E11.9 Reather Littler, MD  Active   pioglitazone (ACTOS) 15 MG tablet [id number]  Take 1 tablet (15 mg total) by mouth daily. [place], [place], MD  Active   ramipril (ALTACE) 10 MG capsule [id number] Yes Take 1 capsule (10 mg total) by mouth daily. [place], [name], MD Taking Active   sildenafil (VIAGRA) 50 MG tablet [id number] Yes TAKE ONE TABLET BY MOUTH DAILY AS NEEDED FOR ERECTILE DYSFUNCTION [place], [name], MD Taking Active   spironolactone (ALDACTONE) 25 MG tablet [id number] Yes TAKE 1 TABLET BY MOUTH EVERY DAY FOR BLOOD PRESSURE [place], [name], MD Taking Active               Assessment/Plan:   Diabetes: - Currently uncontrolled at last A1C check but reporting controlled readings at home since then - Reviewed long term cardiovascular and renal outcomes of uncontrolled blood sugar - Reviewed goal A1c, goal fasting, and goal 2 hour post prandial glucose - Recommend to Continue current medication therapy. Would consider starting a SGLT2 in presence of HFpEF at next endo appt with goal of decreasing insulin burden or perhaps being able to come off the actos  - Patient denies personal or family history of multiple endocrine neoplasia  type 2, medullary thyroid cancer; personal history of pancreatitis or gallbladder disease. - Recommend to check glucose once to twice daily    Hypertension: - Currently uncontrolled - Reviewed long term cardiovascular and renal outcomes of uncontrolled blood pressure - Reviewed appropriate blood pressure monitoring technique and reviewed goal blood pressure. Recommended to check home blood pressure and heart rate at least once weekly - Recommend to continue current medication therapy. If elevated at next PCP appt, consider increasing Ramipril to 20mg  daily  -Requested refill on chlorthalidone, message sent to PCP     Hyperlipidemia/ASCVD Risk Reduction: - Currently controlled.  - Recommend to continue current medication therapy, due for updated lipid panel      Follow Up Plan: 2 months  Sherrill Raring, PharmD Clinical Pharmacist [phone]

## 2023-08-09 NOTE — Telephone Encounter (Signed)
-----   Message from Sherrill Raring sent at 08/09/2023 10:31 AM EST ----- Regarding: Med Refill Request Hello,  Patient is currently out of his chlorthalidone and requesting a refill prior to his PCP appt scheduled on 08/18/23.  Chlorthalidone 25mg  1/2 tab by mouth once daily  [id number] [place], [place] - [address] AT [facility] [address] P: [phone] F: [phone]  Thank you! Sherrill Raring, PharmD Clinical Pharmacist [phone]

## 2023-08-18 ENCOUNTER — Ambulatory Visit (INDEPENDENT_AMBULATORY_CARE_PROVIDER_SITE_OTHER): Payer: PPO | Admitting: Family Medicine

## 2023-08-18 ENCOUNTER — Encounter: Payer: Self-pay | Admitting: Family Medicine

## 2023-08-18 VITALS — BP 126/60 | HR 88 | Resp 16 | Ht 72.0 in | Wt 223.2 lb

## 2023-08-18 DIAGNOSIS — E1169 Type 2 diabetes mellitus with other specified complication: Secondary | ICD-10-CM | POA: Diagnosis not present

## 2023-08-18 DIAGNOSIS — Z794 Long term (current) use of insulin: Secondary | ICD-10-CM | POA: Diagnosis not present

## 2023-08-18 DIAGNOSIS — M25511 Pain in right shoulder: Secondary | ICD-10-CM | POA: Diagnosis not present

## 2023-08-18 DIAGNOSIS — I1A0 Resistant hypertension: Secondary | ICD-10-CM | POA: Diagnosis not present

## 2023-08-18 DIAGNOSIS — E7800 Pure hypercholesterolemia, unspecified: Secondary | ICD-10-CM

## 2023-08-18 DIAGNOSIS — I5032 Chronic diastolic (congestive) heart failure: Secondary | ICD-10-CM | POA: Diagnosis not present

## 2023-08-18 MED ORDER — CHLORTHALIDONE 25 MG PO TABS: 25.0000 mg | ORAL_TABLET | Freq: Every day | ORAL | 2 refills | Status: DC

## 2023-08-18 NOTE — Assessment & Plan Note (Signed)
 BP adequately controlled. He has been taking chlorthalidone  25 mg daily instead 12.5 mg, continue current dose. Continue amlodipine  10 mg daily, metoprolol  titrate 100 mg twice daily, Altace  10 mg daily, and spironolactone  25 mg daily. Recommend monitoring BP regularly. Continue low-salt diet. Keep appointment in 11/2023.

## 2023-08-18 NOTE — Patient Instructions (Addendum)
 A few things to remember from today's visit:  Resistant hypertension - Plan: chlorthalidone  (HYGROTON ) 25 MG tablet No changes today. Keep appt with your endocrinologist. I will see you in May 2025.  Be sure to follow with your cardiologist as recommended.  Range of motion exercises for right shoulder. Tylenol 500 mg 2-3 times per day if needed.  Schedule your eye exam.  If you need refills for medications you take chronically, please call your pharmacy. Do not use My Chart to request refills or for acute issues that need immediate attention. If you send a my chart message, it may take a few days to be addressed, specially if I am not in the office.  Please be sure medication list is accurate. If a new problem present, please set up appointment sooner than planned today.

## 2023-08-18 NOTE — Progress Notes (Signed)
 HPI: Patrick Wilcox is a 78 y.o. male with a PMHx significant for HTN, HFpEF, HLD, gout, and DM II, who is here today for chronic disease management.  Last seen on 05/23/2023 Last visit ABI was ordered, has not had it done. He denies claudications.  Also referred to dermatologist for left foot ulcer that was not healing. He did not receive call about appt information. Reports that lesions has healed completely.  In general he reports that he is feeling good.  Hypertension:  Currently on amlodipine  10 mg, metoprolol  tartrate 100 mg twice daily, chlorthalidone  25 mg daily, Altace  10 mg daily, and spironolactone  25 mg daily.  BP readings at home: He checks his BP occasionally at the drug store.  Side effects: none Negative for unusual or severe headache, visual changes, exertional chest pain, dyspnea, palpitations, focal weakness, or edema.  HFrEF: Echo 07/2021 LVEF 40-45%. Negative for orthopnea and PND. He has not scheduled follow up appt with cardiologist.  Lab Results  Component Value Date   CREATININE 1.36 05/23/2023   BUN 27 (H) 05/23/2023   NA 135 05/23/2023   K 4.6 05/23/2023   CL 99 05/23/2023   CO2 27 05/23/2023   Hyperlipidemia: Currently on atorvastatin  40 mg daily.  Side effects from medication: none Lab Results  Component Value Date   CHOL 102 11/10/2020   HDL 27.70 (L) 11/10/2020   LDLCALC 48 11/10/2020   LDLDIRECT 71.0 02/28/2022   TRIG 129.0 11/10/2020   CHOLHDL 4 11/10/2020   Diabetes Mellitus II: Currently on Novolog  20-22 units twice daily before meals, Tresiba  34 units daily, metformin  1000 mg twice daily, and Actos  15 mg daily.  He says his diet is about the same since his last visit. He has gained 8 lbs since the last visit.  Follows with endocrinologist.  Lab Results  Component Value Date   HGBA1C 7.5 (A) 06/20/2023   Today he complains he has occasional right shoulder pain that radiates down his arm for about 2 weeks. It has improved  since it's onset.  No numbness or tingling. Exacerbated by sleeping on rifht side. No significant limitation of ROM.  No recent injuries.  He is not taking anything for the pain.   Review of Systems  Constitutional:  Negative for activity change, appetite change and fever.  HENT:  Negative for nosebleeds and sore throat.   Respiratory:  Negative for shortness of breath.   Cardiovascular:  Negative for chest pain and palpitations.  Gastrointestinal:  Negative for abdominal pain, nausea and vomiting.  Genitourinary:  Negative for decreased urine volume, dysuria and hematuria.  Skin:  Negative for rash.  Neurological:  Negative for syncope and facial asymmetry.  Psychiatric/Behavioral:  Negative for confusion and hallucinations.   See other pertinent positives and negatives in HPI.  Current Outpatient Medications on File Prior to Visit  Medication Sig Dispense Refill   Alcohol Swabs (B-D SINGLE USE SWABS REGULAR) PADS Use as necessary 300 each 6   amLODipine  (NORVASC ) 10 MG tablet Take 1 tablet (10 mg total) by mouth daily. 90 tablet 3   aspirin  81 MG tablet Take 81 mg by mouth daily.     atorvastatin  (LIPITOR) 40 MG tablet Take 1 tablet (40 mg total) by mouth daily. 90 tablet 1   Blood Glucose Monitoring Suppl (ONETOUCH VERIO IQ SYSTEM) w/Device KIT USE TO CHECK BLOOD SUGAR TWICE A DAY DX CODE E11.9 1 kit 2   colchicine  0.6 MG tablet Take 1 tablet (0.6 mg total) by mouth  daily as needed (for gout attack.). 90 tablet 2   Continuous Glucose Sensor (FREESTYLE LIBRE 2 SENSOR) MISC 2 Devices by Does not apply route every 14 (fourteen) days. 6 each 3   furosemide  (LASIX ) 20 MG tablet Take 1 tablet (20 mg total) by mouth daily as needed for edema. 90 tablet 2   glucose blood (ONETOUCH VERIO) test strip Check before meals and at bedtime. 300 strip 3   insulin  aspart (NOVOLOG  FLEXPEN) 100 UNIT/ML FlexPen INJECT 20-22 UNITS INTO THE SKIN 2 (TWO) TIMES DAILY BEFORE A MEAL. E11.69 45 mL 1   insulin   degludec (TRESIBA  FLEXTOUCH) 100 UNIT/ML FlexTouch Pen Inject 34 Units into the skin daily. E11.69 45 mL 1   Insulin  Pen Needle (B-D UF III MINI PEN NEEDLES) 31G X 5 MM MISC USE ONCE DAILY 100 each 3   metFORMIN  (GLUCOPHAGE ) 1000 MG tablet TAKE ONE TABLET BY MOUTH TWICE DAILY. TAKE WITH A MEAL. 180 tablet 1   metoprolol  tartrate (LOPRESSOR ) 100 MG tablet Take 1 tablet (100 mg total) by mouth 2 (two) times daily. 180 tablet 2   OneTouch Delica Lancets 33G MISC Use to check blood sugar twice daily. DX Code E11.9 200 each 1   pioglitazone  (ACTOS ) 15 MG tablet Take 1 tablet (15 mg total) by mouth daily. 90 tablet 3   ramipril  (ALTACE ) 10 MG capsule Take 1 capsule (10 mg total) by mouth daily. 90 capsule 2   sildenafil  (VIAGRA ) 50 MG tablet TAKE ONE TABLET BY MOUTH DAILY AS NEEDED FOR ERECTILE DYSFUNCTION 90 tablet 0   spironolactone  (ALDACTONE ) 25 MG tablet TAKE 1 TABLET BY MOUTH EVERY DAY FOR BLOOD PRESSURE 90 tablet 1   No current facility-administered medications on file prior to visit.    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   PNEUMONIA, LEFT LOWER LOBE 09/08/2009   Rash    No Known Allergies  Social History   Socioeconomic History   Marital status: Widowed    Spouse name: Not on file   Number of children: 2   Years of education: Not on file   Highest education level: Not on file  Occupational History    Comment: Retired from [employer]  Tobacco Use   Smoking status: Never   Smokeless tobacco: Never  Vaping Use   Vaping status: Never Used  Substance and Sexual Activity   Alcohol use: No    Comment: stopped drinking etoh completely in 18-Jun-2000   Drug use: No   Sexual activity: Yes    Comment: uses viagra   Other Topics Concern   Not on file  Social  History Narrative   Lives alone   Retired from [employer] in 2000-06-18   Twin sons, one son died of a stroke this year 06/19/2019) at age 67, his living son resides in [place]    His wife died of breast cancer 11 years ago    Social Drivers of Corporate Investment Banker Strain: Low Risk  (02/02/2022)   Overall Financial Resource Strain (CARDIA)    Difficulty of Paying Living Expenses: Not hard at all  Food Insecurity: No Food Insecurity (02/02/2022)   Hunger Vital Sign    Worried About Running Out of Food in the Last Year: Never true    Ran  Out of Food in the Last Year: Never true  Transportation Needs: No Transportation Needs (02/02/2022)   PRAPARE - Administrator, Civil Service (Medical): No    Lack of Transportation (Non-Medical): No  Physical Activity: Sufficiently Active (02/02/2022)   Exercise Vital Sign    Days of Exercise per Week: 7 days    Minutes of Exercise per Session: 150+ min  Stress: No Stress Concern Present (02/02/2022)   [employer] of Occupational Health - Occupational Stress Questionnaire    Feeling of Stress : Not at all  Social Connections: Moderately Integrated (02/02/2022)   Social Connection and Isolation Panel [NHANES]    Frequency of Communication with Friends and Family: More than three times a week    Frequency of Social Gatherings with Friends and Family: More than three times a week    Attends Religious Services: More than 4 times per year    Active Member of Clubs or Organizations: Yes    Attends Banker Meetings: More than 4 times per year    Marital Status: Never married   Vitals:   08/18/23 0844  BP: 126/60  Pulse: 88  Resp: 16  SpO2: 97%   Body mass index is 30.28 kg/m.  Physical Exam Vitals and nursing note reviewed.  Constitutional:      General: He is not in acute distress.    Appearance: He is well-developed.  HENT:     Head: Normocephalic and atraumatic.     Mouth/Throat:     Mouth: Mucous membranes are  moist.     Pharynx: Oropharynx is clear. Uvula midline.  Eyes:     Conjunctiva/sclera: Conjunctivae normal.  Cardiovascular:     Rate and Rhythm: Normal rate and regular rhythm. Extrasystoles (x 2 in a min) are present.    Pulses:          Posterior tibial pulses are 2+ on the right side and 2+ on the left side.     Heart sounds: No murmur heard.    Comments: DP pulses palpable. Pulmonary:     Effort: Pulmonary effort is normal. No respiratory distress.     Breath sounds: Normal breath sounds.  Abdominal:     Palpations: Abdomen is soft. There is no hepatomegaly or mass.     Tenderness: There is no abdominal tenderness.  Musculoskeletal:     Right lower leg: No edema.     Left lower leg: No edema.     Comments: Right shoulder: Vonzell' test negative, drop arm rotator cuff test neg, empty can supraspinatus test neg,  lift-Off Subscapularis test neg. ROM: Mildly limited active abduction.SABRA   Lymphadenopathy:     Cervical: No cervical adenopathy.  Skin:    General: Skin is warm.     Findings: No erythema or rash.  Neurological:     Mental Status: He is alert and oriented to person, place, and time.     Cranial Nerves: No cranial nerve deficit.     Gait: Gait normal.  Psychiatric:        Mood and Affect: Mood and affect normal.   ASSESSMENT AND PLAN:  Mr. Bisig was seen today for chronic disease management.   Acute pain of right shoulder ? Tendinitis, OA. Improved. I do not think imaging is needed. ROM exercises at home recommended. If needed Tylenol 500 mg 2-3 times prn.  Resistant hypertension Assessment & Plan: BP adequately controlled. He has been taking chlorthalidone  25 mg daily instead 12.5 mg, continue current dose.  Continue amlodipine  10 mg daily, metoprolol  titrate 100 mg twice daily, Altace  10 mg daily, and spironolactone  25 mg daily. Recommend monitoring BP regularly. Continue low-salt diet. Keep appointment in 11/2023.  Orders: -     Chlorthalidone ; Take  1 tablet (25 mg total) by mouth daily. For blood pressure.  Dispense: 90 tablet; Refill: 2  Chronic heart failure with preserved ejection fraction (HCC) Euvolemic at this time. Currently on Altace  10 mg daily, spironolactone  25 mg daily, metoprolol  10 mg twice daily. Also on furosemide  20 mg daily as needed. Stressed the importance of following with cardiologist as recommended.  He prefers to hold on ABI for now.  Type 2 diabetes mellitus with other specified complication, with long-term current use of insulin  (HCC) Last hemoglobin A1c 7.5 in 06/2023. Left foot ulcer has healed. He has an appointment with endocrinologist scheduled for 09/2023.  Pure hypercholesterolemia  Last LDL 48. He is not fasting today. Continue Atorvastatin  40 mg daily and low fat diet.  -He is not interested in ABI. Today pulses palpable. No symptoms suggestive for claudication. Stressed the importance of following with cardiologist as recommended.  Return if symptoms worsen or fail to improve, for keep next appointment.  I, Leonce PARAS Wierda, acting as a scribe for Tayden Duran, MD., have documented all relevant documentation on the behalf of Tysheem Accardo, MD, as directed by  Allexis Bordenave, MD while in the presence of Shemika Robbs, MD.   I, Charlann Wayne, MD, have reviewed all documentation for this visit. The documentation on 08/18/23 for the exam, diagnosis, procedures, and orders are all accurate and complete.  Chelesa Weingartner G. Nickolus Wadding, MD  [facility]. [place] office.

## 2023-08-19 NOTE — Assessment & Plan Note (Deleted)
 Last A1C 7.5 in 06/2023. Follows with endocrinologist.

## 2023-08-19 NOTE — Assessment & Plan Note (Signed)
 Last LDL 48. He is not fasting today. Continue Atorvastatin  40 mg daily and low fat diet.

## 2023-08-19 NOTE — Assessment & Plan Note (Signed)
 Last echo in 07/2021 was abnormal, LVEF 40-45% (60-65%). He is asymptomatic. Currently on Metoprolol  tartrate, Ramipril ,and furosemide . He is not on a SGLT2 inhibitor. Low salt diet to continue.

## 2023-09-13 ENCOUNTER — Other Ambulatory Visit: Payer: Self-pay

## 2023-09-13 DIAGNOSIS — E1169 Type 2 diabetes mellitus with other specified complication: Secondary | ICD-10-CM | POA: Diagnosis not present

## 2023-09-14 LAB — HEMOGLOBIN A1C
Hgb A1c MFr Bld: 8.9 %{Hb} — ABNORMAL HIGH (ref <5.7)
Mean Plasma Glucose: 209 mg/dL
eAG (mmol/L): 11.6 mmol/L

## 2023-09-14 LAB — BASIC METABOLIC PANEL
BUN: 18 mg/dL (ref 7–25)
CO2: 27 mmol/L (ref 20–32)
Calcium: 9.6 mg/dL (ref 8.6–10.3)
Chloride: 102 mmol/L (ref 98–110)
Creat: 1.06 mg/dL (ref 0.70–1.28)
Glucose, Bld: 118 mg/dL — ABNORMAL HIGH (ref 65–99)
Potassium: 4.9 mmol/L (ref 3.5–5.3)
Sodium: 139 mmol/L (ref 135–146)

## 2023-09-18 ENCOUNTER — Encounter: Payer: Self-pay | Admitting: Endocrinology

## 2023-09-18 ENCOUNTER — Ambulatory Visit (INDEPENDENT_AMBULATORY_CARE_PROVIDER_SITE_OTHER): Payer: Self-pay | Admitting: Endocrinology

## 2023-09-18 VITALS — BP 128/82 | HR 76 | Resp 18 | Ht 72.0 in | Wt 224.4 lb

## 2023-09-18 DIAGNOSIS — E1165 Type 2 diabetes mellitus with hyperglycemia: Secondary | ICD-10-CM | POA: Diagnosis not present

## 2023-09-18 DIAGNOSIS — Z794 Long term (current) use of insulin: Secondary | ICD-10-CM

## 2023-09-18 NOTE — Patient Instructions (Signed)
 Diabetes regimen  Tresiba 36 units daily. Adjust NovoLog 10-20 units with meals 3 times a day, larger meal 20 units and small meal 10 untis. Adjust based on meal, not more than 20 units at at time.  Continue metformin 1000 mg 2 times a day. Continue Actos/pioglitazone 15 mg daily.  Check blood sugar in the morning fasting and at bedtime at least twice a day.

## 2023-09-18 NOTE — Progress Notes (Signed)
 Outpatient Endocrinology Note [place] Capers Hagmann, MD  09/18/23  Patient's Name: Patrick Wilcox    DOB: 13-Jan-1946    MRN: [id number]                                                    REASON OF VISIT: Follow up for type 2 diabetes mellitus  PCP: [place], [name], MD  HISTORY OF PRESENT ILLNESS:   Patrick Wilcox is a 78 y.o. old male with past medical history listed below, is here for  follow up of type 2 diabetes mellitus.   Pertinent Diabetes History: Patient was diagnosed with type 2 diabetes mellitus in 2004.  He was initially treated with insulin and subsequently switched back to oral medications.  He had been on and off of the insulin in the past.  He was on Amaryl and Januvia in the past per record.  He was also on premixed insulin in the past which was switched to Lantus and Humalog later.  He had seen diabetic educator had lifestyle changes and had better control of diabetes however overall he has uncontrolled diabetes mellitus.  Chronic Diabetes Complications : Retinopathy: Unknown. Last ophthalmology exam was done on Due, referred to ophthalmology. Nephropathy: no, on ramipril.  History of microalbuminuria. Peripheral neuropathy: yes Coronary artery disease: yes Stroke: no  Relevant comorbidities and cardiovascular risk factors: Obesity: no Body mass index is 30.43 kg/m.  Hypertension: yes Hyperlipidemia. Yes, on statin  Current / Home Diabetic regimen includes: Tresiba 36 units daily.  NovoLog 20 units taking 1-2 a day. Metformin 1000 mg 2 times a day. Pioglitazone/Actos 15 mg daily.  Prior diabetic medications: Amaryl/glimepiride and Januvia.  Glycemic data:   He had used freestyle libre CGM in the past, did not like the idea of alarms, in the accuracy of the reading at times, required to change often.  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 Raw data and trends analyzed.  One Touch Verio glucometer.  From February 24 to September 18, 2023. -  He has been testing  his blood glucoses 1 time daily, in the morning fasting.  -  Average glucose for the last 14 days is 114 mg/dl.  Mostly fasting sugar in the range of 90-140.  One of the time blood sugar 69 however immediately rechecking 139.  No hypoglycemia.   Hypoglycemia: Patient has no hypoglycemic episodes. Patient has hypoglycemia awareness.  Factors modifying glucose control: 1.  Diabetic diet assessment: two meals a day.   2.  Staying active or exercising: walking.   3.  Medication compliance: compliant all of the time.  Interval history  Glucometer data as reviewed above.  Hemoglobin A1c worsening 8.9%.  Patient reports he has been mostly taking NovoLog once a day and rarely twice a day.  If his blood sugar is abnormal lower normal range and when eating generally do not take NovoLog.  Taking Tresiba 36 units daily.  Reports compliance with metformin and Actos.  He sometimes snacks with sweets.  No other complaints today.  REVIEW OF SYSTEMS As per history of present illness.   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   PNEUMONIA, LEFT LOWER LOBE 09/08/2009   Rash     PAST SURGICAL HISTORY: Past Surgical History:  Procedure Laterality Date   CORONARY ANGIOPLASTY WITH STENT PLACEMENT     CORONARY ARTERY BYPASS GRAFT  2001-09-29    ALLERGIES: No Known Allergies  FAMILY HISTORY:  Family History  Problem Relation Age of Onset   Heart disease Mother    Heart disease Father    Diabetes Sister    Diabetes Brother     SOCIAL HISTORY: Social History   Socioeconomic History   Marital status: Widowed    Spouse name: Not on file   Number of children: 2   Years of education: Not on file   Highest education level: Not on file  Occupational  History    Comment: Retired from [employer]  Tobacco Use   Smoking status: Never   Smokeless tobacco: Never  Vaping Use   Vaping status: Never Used  Substance and Sexual Activity   Alcohol use: No    Comment: stopped drinking etoh completely in 1999-09-30   Drug use: No   Sexual activity: Yes    Comment: uses viagra  Other Topics Concern   Not on file  Social History Narrative   Lives alone   Retired from [employer] in 09-30-99   Twin sons, one son died of a stroke this year September 30, 2018) at age 44, his living son resides in [place]    His wife died of breast cancer 11 years ago    Social Drivers of Corporate investment banker Strain: Low Risk  (02/02/2022)   Overall Financial Resource Strain (CARDIA)    Difficulty of Paying Living Expenses: Not hard at all  Food Insecurity: No Food Insecurity (02/02/2022)   Hunger Vital Sign    Worried About Running Out of Food in the Last Year: Never true    Ran Out of Food in the Last Year: Never true  Transportation Needs: No Transportation Needs (02/02/2022)   PRAPARE - Administrator, Civil Service (Medical): No    Lack of Transportation (Non-Medical): No  Physical Activity: Sufficiently Active (02/02/2022)   Exercise Vital Sign    Days of Exercise per Week: 7 days    Minutes of Exercise per Session: 150+ min  Stress: No Stress Concern Present (02/02/2022)   [employer] of Occupational Health - Occupational Stress Questionnaire    Feeling of Stress : Not at all  Social Connections: Moderately Integrated (02/02/2022)   Social Connection and Isolation Panel [NHANES]    Frequency of Communication with Friends and Family: More than three times a week    Frequency of Social Gatherings with Friends and Family: More than three times a week    Attends Religious Services: More than 4 times per year    Active Member of [employer] or Organizations: Yes    Attends Engineer, structural: More than 4 times per year    Marital Status: Never  married    MEDICATIONS:  Current Outpatient Medications  Medication Sig Dispense Refill   Alcohol Swabs (B-D SINGLE USE SWABS REGULAR) PADS Use as necessary 300 each 6   amLODipine (NORVASC) 10 MG tablet Take 1 tablet (10 mg total) by mouth daily. 90 tablet 3   aspirin 81 MG tablet Take 81 mg by mouth daily.     atorvastatin (LIPITOR) 40 MG tablet Take 1 tablet (40 mg total) by mouth daily. 90 tablet 1   Blood Glucose Monitoring Suppl (  ONETOUCH VERIO IQ SYSTEM) w/Device KIT USE TO CHECK BLOOD SUGAR TWICE A DAY DX CODE E11.9 1 kit 2   chlorthalidone (HYGROTON) 25 MG tablet Take 1 tablet (25 mg total) by mouth daily. For blood pressure. 90 tablet 2   colchicine 0.6 MG tablet Take 1 tablet (0.6 mg total) by mouth daily as needed (for gout attack.). 90 tablet 2   Continuous Glucose Sensor (FREESTYLE LIBRE 2 SENSOR) MISC 2 Devices by Does not apply route every 14 (fourteen) days. 6 each 3   furosemide (LASIX) 20 MG tablet Take 1 tablet (20 mg total) by mouth daily as needed for edema. 90 tablet 2   glucose blood (ONETOUCH VERIO) test strip Check before meals and at bedtime. 300 strip 3   insulin aspart (NOVOLOG FLEXPEN) 100 UNIT/ML FlexPen INJECT 20-22 UNITS INTO THE SKIN 2 (TWO) TIMES DAILY BEFORE A MEAL. E11.69 45 mL 1   insulin degludec (TRESIBA FLEXTOUCH) 100 UNIT/ML FlexTouch Pen Inject 34 Units into the skin daily. E11.69 45 mL 1   Insulin Pen Needle (B-D UF III MINI PEN NEEDLES) 31G X 5 MM MISC USE ONCE DAILY 100 each 3   metFORMIN (GLUCOPHAGE) 1000 MG tablet TAKE ONE TABLET BY MOUTH TWICE DAILY. TAKE WITH A MEAL. 180 tablet 1   metoprolol tartrate (LOPRESSOR) 100 MG tablet Take 1 tablet (100 mg total) by mouth 2 (two) times daily. 180 tablet 2   OneTouch Delica Lancets 33G MISC Use to check blood sugar twice daily. DX Code E11.9 200 each 1   pioglitazone (ACTOS) 15 MG tablet Take 1 tablet (15 mg total) by mouth daily. 90 tablet 3   ramipril (ALTACE) 10 MG capsule Take 1 capsule (10 mg  total) by mouth daily. 90 capsule 2   sildenafil (VIAGRA) 50 MG tablet TAKE ONE TABLET BY MOUTH DAILY AS NEEDED FOR ERECTILE DYSFUNCTION 90 tablet 0   spironolactone (ALDACTONE) 25 MG tablet TAKE 1 TABLET BY MOUTH EVERY DAY FOR BLOOD PRESSURE 90 tablet 1   No current facility-administered medications for this visit.    PHYSICAL EXAM: Vitals:   09/18/23 0948  BP: 128/82  Pulse: 76  Resp: 18  SpO2: 99%  Weight: 224 lb 6.4 oz (101.8 kg)  Height: 6' (1.829 m)    Body mass index is 30.43 kg/m.  Wt Readings from Last 3 Encounters:  09/18/23 224 lb 6.4 oz (101.8 kg)  08/18/23 223 lb 4 oz (101.3 kg)  06/20/23 215 lb 6.4 oz (97.7 kg)    General: Well developed, well nourished male in no apparent distress.  HEENT: AT/[place], no external lesions.  Eyes: Conjunctiva clear and no icterus. Neck: Neck supple  Lungs: Respirations not labored Neurologic: Alert, oriented, normal speech Extremities / Skin: Dry.  Psychiatric: Does not appear depressed or anxious  Diabetic Foot Exam - Simple   Simple Foot Form Diabetic Foot exam was performed with the following findings: Yes 09/18/2023 10:02 AM  Visual Inspection See comments: Yes Sensation Testing Intact to touch and monofilament testing bilaterally: Yes Pulse Check See comments: Yes Comments DP palpable bilaterally.  Dystrophic nails +    LABS Reviewed Lab Results  Component Value Date   HGBA1C 8.9 (H) 09/13/2023   HGBA1C 7.5 (A) 06/20/2023   HGBA1C 8.6 (H) 03/15/2023   Lab Results  Component Value Date   FRUCTOSAMINE 305 (H) 06/01/2020   FRUCTOSAMINE 296 (H) 12/31/2019   FRUCTOSAMINE 315 (H) 09/09/2019   Lab Results  Component Value Date   CHOL 102 11/10/2020  HDL 27.70 (L) 11/10/2020   LDLCALC 48 11/10/2020   LDLDIRECT 71.0 02/28/2022   TRIG 129.0 11/10/2020   CHOLHDL 4 11/10/2020   Lab Results  Component Value Date   MICRALBCREAT 7.0 09/19/2022   MICRALBCREAT 39.9 (H) 11/25/2021   Lab Results  Component Value  Date   CREATININE 1.06 09/13/2023   Lab Results  Component Value Date   GFR 50.16 (L) 05/23/2023    ASSESSMENT / PLAN  1. Uncontrolled type 2 diabetes mellitus with hyperglycemia, with long-term current use of insulin (HCC)     Diabetes Mellitus type 2, complicated by CAD /diabetic foot ulcer/history of microalbuminuria. - Diabetic status / severity: Uncontrolled  Lab Results  Component Value Date   HGBA1C 8.9 (H) 09/13/2023    - Hemoglobin A1c goal : <7%  Diabetes control worsening.  Discussed about taking NovoLog with all the meals.  Diabetes control improving.  - Medications: See below,   Diabetes regimen Continue Tresiba 36 units daily. Adjust NovoLog 10-20 units with meals 3 times a day, larger meal 20 units and small meal 10 untis. Adjust based on meal, not more than 20 units at at time.  Discussed in detail. Continue metformin 1000 mg 2 times a day. Continue Actos/pioglitazone 15 mg daily.  Check blood sugar in the morning fasting and at bedtime at least twice a day.  - Discussed/ Gave Hypoglycemia treatment plan.  # Consult : not required at this time.   # Annual urine for microalbuminuria/ creatinine ratio, no microalbuminuria currently, continue ACE/ARB /ramipril.  Will check today. Last  Lab Results  Component Value Date   MICRALBCREAT 7.0 09/19/2022    # Foot check nightly / neuropathy.  Referred to podiatry.  # Annual dilated diabetic eye exams.  - Diet: Make healthy diabetic food choices - Life style / activity / exercise: Discussed.  2. Blood pressure  -  BP Readings from Last 1 Encounters:  09/18/23 128/82    - Control is in target.  - No change in current plans.  3. Lipid status / Hyperlipidemia - Last  Lab Results  Component Value Date   LDLCALC 48 11/10/2020   - Continue continue atorvastatin 40 mg daily.  Managed by primary care provider/cardiology.  Diagnoses and all orders for this visit:  Uncontrolled type 2 diabetes  mellitus with hyperglycemia, with long-term current use of insulin (HCC) -     Ambulatory referral to Podiatry -     Microalbumin / creatinine urine ratio -     BASIC METABOLIC PANEL WITH GFR -     Hemoglobin A1c      DISPOSITION Follow up in clinic in 3 months suggested.   All questions answered and patient verbalized understanding of the plan.  Labs prior to follow-up visit as ordered.  [place] Francessca Friis, MD [facility] Endocrinology [facility] [address] [place], [address] [place], [place] [address] Phone # [phone]  At least part of this note was generated using voice recognition software. Inadvertent word errors may have occurred, which were not recognized during the proofreading process.

## 2023-09-19 LAB — MICROALBUMIN / CREATININE URINE RATIO
Creatinine, Urine: 66 mg/dL (ref 20–320)
Microalb Creat Ratio: 362 mg/g{creat} — ABNORMAL HIGH (ref <30)
Microalb, Ur: 23.9 mg/dL

## 2023-09-29 ENCOUNTER — Other Ambulatory Visit: Payer: Self-pay | Admitting: Internal Medicine

## 2023-09-29 DIAGNOSIS — E1169 Type 2 diabetes mellitus with other specified complication: Secondary | ICD-10-CM

## 2023-10-17 ENCOUNTER — Other Ambulatory Visit: Payer: Self-pay

## 2023-10-17 ENCOUNTER — Other Ambulatory Visit: Payer: Self-pay | Admitting: Endocrinology

## 2023-10-17 DIAGNOSIS — E1169 Type 2 diabetes mellitus with other specified complication: Secondary | ICD-10-CM

## 2023-10-17 MED ORDER — TRESIBA FLEXTOUCH 100 UNIT/ML ~~LOC~~ SOPN: PEN_INJECTOR | SUBCUTANEOUS | 4 refills | Status: DC

## 2023-10-18 ENCOUNTER — Other Ambulatory Visit: Payer: Self-pay

## 2023-10-19 ENCOUNTER — Ambulatory Visit: Admitting: Podiatry

## 2023-10-23 ENCOUNTER — Other Ambulatory Visit: Payer: Self-pay

## 2023-10-24 ENCOUNTER — Other Ambulatory Visit (INDEPENDENT_AMBULATORY_CARE_PROVIDER_SITE_OTHER): Payer: Self-pay

## 2023-10-24 DIAGNOSIS — Z794 Long term (current) use of insulin: Secondary | ICD-10-CM

## 2023-10-24 DIAGNOSIS — E1169 Type 2 diabetes mellitus with other specified complication: Secondary | ICD-10-CM

## 2023-10-24 NOTE — Progress Notes (Signed)
 10/24/2023 Name: Patrick Wilcox MRN: [id number] DOB: 08-11-1945  Chief Complaint  Patient presents with   Medication Management   Diabetes   Hypertension    Patrick Wilcox is a 78 y.o. year old male who presented for a telephone visit.   They were referred to the pharmacist by their PCP for assistance in managing diabetes, hypertension, and complex medication management.    Subjective:  Care Team: Primary Care Provider: [place], [name], MD ; Next Scheduled Visit: 11/1223  Medication Access/Adherence  Current Pharmacy:  [id number] [place], [place] - [address] AT [facility] [address] [place] [address] Phone: [phone] Fax: [phone]  [facility] [id number] - [place], [place] - [address] [facility] [address] [facility] [address] [place] [address] Phone: [phone] Fax: [phone]  [facility] [place] (New Address) - [place], [place] - [address] [facility] [address] [facility] AT Previously: [place], [place] [address] [facility] [address] [place] [address] Phone: [phone] Fax: [phone]  [phone] [place], [place] - [address] AT CORNER OF [facility] [address] [place] [address] Phone: [phone] Fax: [phone]   Patient reports affordability concerns with their medications: No  Patient reports access/transportation concerns to their pharmacy: No  Patient reports adherence concerns with their medications:  No     Diabetes:  Current medications: Metformin 1000mg  BID, Lantus 36 units, Novolog 20-22 units BID, Actos 15mg  daily,  Medications tried in the past: Glimepiride, Januvia  Current glucose readings: denies any readings above 140, reports highest of 132 in the last month. One low of 65 in last 30 days, corrected with a piece of candy  Checks once daily, fasting  Patient denies hypoglycemic s/sx including  dizziness, shakiness, sweating. Patient denies hyperglycemic symptoms including polyuria, polydipsia, polyphagia, nocturia, neuropathy, blurred vision.   Hypertension:  Current medications: Ramipril 10mg  1 capsule daily, Spironolactone 25mg , Metoprolol tartrate 100mg  BID, Lasix 20mg  once daily prn edema, Chlorthalidone 25mg  1/2 tab daily, Amlodipine 10mg   Medications previously tried: HCTZ  Patient has a validated, automated, upper arm home BP cuff Current blood pressure readings readings: Uses home monitor and checks every time he is at drug store, reports this usually happens at least 1-2x every couple weeks, cannot recall any readings though, said he "felt like they looked good" but wasn't sure  Patient denies hypotensive s/sx including dizziness, lightheadedness.  Patient denies hypertensive symptoms including headache, chest pain, shortness of breath    Hyperlipidemia/ASCVD Risk Reduction  Current lipid lowering medications: Atorvastatin 40mg  1 qd Medications tried in the past: None    Objective:  Lab Results  Component Value Date   HGBA1C 8.9 (H) 09/13/2023    Lab Results  Component Value Date   CREATININE 1.06 09/13/2023   BUN 18 09/13/2023   NA 139 09/13/2023   K 4.9 09/13/2023   CL 102 09/13/2023   CO2 27 09/13/2023    Lab Results  Component Value Date   CHOL 102 11/10/2020   HDL 27.70 (L) 11/10/2020   LDLCALC 48 11/10/2020   LDLDIRECT 71.0 02/28/2022   TRIG 129.0 11/10/2020   CHOLHDL 4 11/10/2020    Medications Reviewed Today     Reviewed by Sherrill Raring, RPH (Pharmacist) on 10/24/23 at 1049  Med List Status: <None>   Medication Order Taking? Sig Documenting Provider Last Dose Status Informant  Alcohol Swabs (B-D SINGLE USE SWABS REGULAR) PADS [id number]  Use  as necessary Lajean Pike, MD  Active   amLODipine (NORVASC) 10 MG tablet [id number] Yes Take 1 tablet (10 mg total) by mouth daily. [place], [name], MD Taking Active   aspirin 81 MG tablet  [id number] Yes Take 81 mg by mouth daily. [provider] Taking Active   atorvastatin (LIPITOR) 40 MG tablet [id number] Yes Take 1 tablet (40 mg total) by mouth daily. [place], [name], MD Taking Active   Blood Glucose Monitoring Suppl [facility] VERIO IQ SYSTEM) w/Device Suzanne Erps [id number]  USE TO CHECK BLOOD SUGAR TWICE A DAY DX CODE E11.9 Lajean Pike, MD  Active   chlorthalidone (HYGROTON) 25 MG tablet [id number] Yes Take 1 tablet (25 mg total) by mouth daily. For blood pressure. [place], [name], MD Taking Active   colchicine 0.6 MG tablet [id number] Yes Take 1 tablet (0.6 mg total) by mouth daily as needed (for gout attack.). [place], [name], MD Taking Active   furosemide (LASIX) 20 MG tablet [id number]  Take 1 tablet (20 mg total) by mouth daily as needed for edema. [place], [name], MD  Active   glucose blood [facility] VERIO) test strip [id number]  Check before meals and at bedtime. [place], [place], MD  Active   insulin aspart (NOVOLOG FLEXPEN) 100 UNIT/ML FlexPen [id number] Yes INJECT 20-22 UNITS INTO THE SKIN 2 (TWO) TIMES DAILY BEFORE A MEAL. E11.69 Thapa, [place], MD Taking Active   insulin glargine (LANTUS SOLOSTAR) 100 UNIT/ML Solostar Pen [id number] Yes Inject 36 Units into the skin daily. Thapa, [place], MD Taking Active   Insulin Pen Needle (B-D UF III MINI PEN NEEDLES) 31G X 5 MM MISC [id number]  USE ONCE DAILY Lajean Pike, MD  Active   metFORMIN (GLUCOPHAGE) 1000 MG tablet [id number] Yes TAKE ONE TABLET BY MOUTH TWICE DAILY. TAKE WITH A MEAL. Thapa, [place], MD Taking Active   metoprolol tartrate (LOPRESSOR) 100 MG tablet [id number] Yes Take 1 tablet (100 mg total) by mouth 2 (two) times daily. [place], [name], MD Taking Active   [facility] Lancets 33G [place] [id number]  Use to check blood sugar twice daily. DX Code E11.9 Lajean Pike, MD  Active   pioglitazone (ACTOS) 15 MG tablet [id number] Yes Take 1 tablet (15 mg total) by mouth daily. Thapa, [place], MD Taking Active   ramipril (ALTACE) 10 MG capsule  [id number] Yes Take 1 capsule (10 mg total) by mouth daily. [place], [name], MD Taking Active   sildenafil (VIAGRA) 50 MG tablet [id number]  TAKE ONE TABLET BY MOUTH DAILY AS NEEDED FOR ERECTILE DYSFUNCTION [place], [name], MD  Active   spironolactone (ALDACTONE) 25 MG tablet [id number] Yes TAKE 1 TABLET BY MOUTH EVERY DAY FOR BLOOD PRESSURE [place], [name], MD Taking Active               Assessment/Plan:   Diabetes: - Currently uncontrolled at last A1C check but reporting controlled readings at home since then - Reviewed long term cardiovascular and renal outcomes of uncontrolled blood sugar - Reviewed goal A1c, goal fasting, and goal 2 hour post prandial glucose - Recommend to Continue current medication therapy. Would consider starting a SGLT2 in presence of HFpEF at next PCP/endo appt with goal of decreasing insulin burden or perhaps being able to come off the actos  - Patient denies personal or family history of multiple endocrine neoplasia type 2, medullary thyroid cancer; personal history of pancreatitis or gallbladder disease. - Recommend to check glucose once to twice daily    Hypertension: - Currently controlled - Reviewed  long term cardiovascular and renal outcomes of uncontrolled blood pressure - Reviewed appropriate blood pressure monitoring technique and reviewed goal blood pressure. Recommended to check home blood pressure and heart rate at least once weekly - Recommend to continue current medication therapy. If elevated at next PCP appt, consider increasing Ramipril to 20mg  daily  -Requested refill on metoprolol, request sent     Hyperlipidemia/ASCVD Risk Reduction: - Currently controlled.  - Recommend to continue current medication therapy, due for updated lipid panel     Follow Up Plan: 2 months  Carnell Christian, PharmD Clinical Pharmacist [phone]

## 2023-10-25 ENCOUNTER — Telehealth: Payer: Self-pay

## 2023-10-25 DIAGNOSIS — I1A0 Resistant hypertension: Secondary | ICD-10-CM

## 2023-10-25 DIAGNOSIS — I5032 Chronic diastolic (congestive) heart failure: Secondary | ICD-10-CM

## 2023-10-25 MED ORDER — METOPROLOL TARTRATE 100 MG PO TABS: 100.0000 mg | ORAL_TABLET | Freq: Two times a day (BID) | ORAL | 2 refills | Status: DC

## 2023-10-25 NOTE — Telephone Encounter (Signed)
-----   Message from Carnell Christian sent at 10/24/2023 10:51 AM EDT ----- Hello,  Patient is requesting refill on the following medication:  Metoprolol tartrate 100mg  Take 1 tab by mouth BID  [facility] Info[id number] [place], [place] - [address] AT [facility] [address] P: [phone] F: [phone]   Thank you! Carnell Christian, PharmD Clinical Pharmacist [phone]

## 2023-11-06 ENCOUNTER — Telehealth: Payer: Self-pay

## 2023-11-06 ENCOUNTER — Other Ambulatory Visit: Payer: Self-pay

## 2023-11-06 NOTE — Telephone Encounter (Signed)
 Patient visited office for request for Tresiba , however looks like this was changed to Lantus  by MD. MD also had sent this refill in on 10/18/23 to patient's preferred pharmacy, however per patient he had been to pharmacy and noone mentioned to him that there was a change. Per pharmacy they did not see order and RN gave verbal order as written by MD in electronic order submitted on 4/9. Pharmacy stated they would have it available for him. Patient made aware RN spoke with pharmacy and that they are getting it ready for him. Patient thanked Charity fundraiser and left office heading to pharmacy per patient.

## 2023-11-20 ENCOUNTER — Ambulatory Visit (INDEPENDENT_AMBULATORY_CARE_PROVIDER_SITE_OTHER): Admitting: Family Medicine

## 2023-11-20 ENCOUNTER — Ambulatory Visit: Payer: Self-pay | Admitting: Family Medicine

## 2023-11-20 ENCOUNTER — Encounter: Payer: Self-pay | Admitting: Family Medicine

## 2023-11-20 VITALS — BP 120/62 | HR 100 | Temp 98.5°F | Resp 16 | Ht 72.0 in | Wt 220.4 lb

## 2023-11-20 DIAGNOSIS — E1169 Type 2 diabetes mellitus with other specified complication: Secondary | ICD-10-CM

## 2023-11-20 DIAGNOSIS — I499 Cardiac arrhythmia, unspecified: Secondary | ICD-10-CM | POA: Diagnosis not present

## 2023-11-20 DIAGNOSIS — Z7984 Long term (current) use of oral hypoglycemic drugs: Secondary | ICD-10-CM | POA: Diagnosis not present

## 2023-11-20 DIAGNOSIS — R0989 Other specified symptoms and signs involving the circulatory and respiratory systems: Secondary | ICD-10-CM

## 2023-11-20 DIAGNOSIS — E785 Hyperlipidemia, unspecified: Secondary | ICD-10-CM | POA: Diagnosis not present

## 2023-11-20 DIAGNOSIS — I5032 Chronic diastolic (congestive) heart failure: Secondary | ICD-10-CM

## 2023-11-20 DIAGNOSIS — I1A0 Resistant hypertension: Secondary | ICD-10-CM | POA: Diagnosis not present

## 2023-11-20 DIAGNOSIS — Z794 Long term (current) use of insulin: Secondary | ICD-10-CM

## 2023-11-20 LAB — CBC
HCT: 38 % — ABNORMAL LOW (ref 39.0–52.0)
Hemoglobin: 12.9 g/dL — ABNORMAL LOW (ref 13.0–17.0)
MCHC: 33.8 g/dL (ref 30.0–36.0)
MCV: 89.5 fl (ref 78.0–100.0)
Platelets: 262 10*3/uL (ref 150.0–400.0)
RBC: 4.24 Mil/uL (ref 4.22–5.81)
RDW: 14.3 % (ref 11.5–15.5)
WBC: 9.6 10*3/uL (ref 4.0–10.5)

## 2023-11-20 LAB — LIPID PANEL
Cholesterol: 212 mg/dL — ABNORMAL HIGH (ref 0–200)
HDL: 38 mg/dL — ABNORMAL LOW (ref >39.00)
LDL Cholesterol: 142 mg/dL — ABNORMAL HIGH (ref 0–99)
NonHDL: 173.82
Total CHOL/HDL Ratio: 6
Triglycerides: 159 mg/dL — ABNORMAL HIGH (ref 0.0–149.0)
VLDL: 31.8 mg/dL (ref 0.0–40.0)

## 2023-11-20 LAB — COMPREHENSIVE METABOLIC PANEL WITH GFR
ALT: 15 U/L (ref 0–53)
AST: 18 U/L (ref 0–37)
Albumin: 4.4 g/dL (ref 3.5–5.2)
Alkaline Phosphatase: 77 U/L (ref 39–117)
BUN: 31 mg/dL — ABNORMAL HIGH (ref 6–23)
CO2: 25 meq/L (ref 19–32)
Calcium: 9.8 mg/dL (ref 8.4–10.5)
Chloride: 96 meq/L (ref 96–112)
Creatinine, Ser: 1.68 mg/dL — ABNORMAL HIGH (ref 0.40–1.50)
GFR: 38.79 mL/min — ABNORMAL LOW (ref >60.00)
Glucose, Bld: 174 mg/dL — ABNORMAL HIGH (ref 70–99)
Potassium: 4.6 meq/L (ref 3.5–5.1)
Sodium: 133 meq/L — ABNORMAL LOW (ref 135–145)
Total Bilirubin: 0.7 mg/dL (ref 0.2–1.2)
Total Protein: 7.6 g/dL (ref 6.0–8.3)

## 2023-11-20 NOTE — Progress Notes (Signed)
 HPI: [name] is a 78 y.o. male  with PMHx significant for resistant HTN, type 2 diabetes, CAD,and HLD, who is here today for chronic disease management.  Last seen on 08/18/23  Hyperlipidemia: Currently on Atorvastatin  40 mg once daily. Following a low fat diet: yes.  Side effects from medication: none.  Lab Results  Component Value Date   CHOL 102 11/10/2020   HDL 27.70 (L) 11/10/2020   LDLCALC 48 11/10/2020   LDLDIRECT 71.0 02/28/2022   TRIG 129.0 11/10/2020   CHOLHDL 4 11/10/2020   Hypertension:  - Medications: Amlodipine  10 mg daily, Chlorthalidone   25 mg daily, Metoprolol  tartrate 100 mg BID, Ramipril  10 mg daily, and Spironolactone  25 mg daily.  - He has been monitoring his BP at home and similar to readings today.  HFpEF: Negative for orthopnea or PND.  - Has been eating home-cooked meals and avoids adding salt to his food.  Negative for unusual or severe headache, visual changes, exertional chest pain, dyspnea,  focal weakness, or edema. He takes Furosemide  20 mg daily prn.  Lab Results  Component Value Date   CREATININE 1.06 09/13/2023   BUN 18 09/13/2023   NA 139 09/13/2023   K 4.9 09/13/2023   CL 102 09/13/2023   CO2 27 09/13/2023   Diabetes Mellitus II: Mentions that his BS's were elevated because changes in medications he was not aware. Follows with endocrinologist. On Novolog  20-22 units BID, Lantus  36 units once daily, Metformin  1,000 mg BID with meals, and Actos  15 mg once daily. - Good compliance and tolerance.  - Diet: Has been working on [employer].   Lab Results  Component Value Date   HGBA1C 8.9 (H) 09/13/2023   Lab Results  Component Value Date   MICROALBUR 23.9 09/18/2023   Review of Systems  Constitutional:  Negative for activity change, appetite change and fever.  HENT:  Negative for sore throat.   Respiratory:  Negative for cough and wheezing.   Gastrointestinal:  Negative for abdominal pain, nausea and vomiting.   Endocrine: Negative for cold intolerance and heat intolerance.  Genitourinary:  Negative for decreased urine volume, dysuria and hematuria.  Skin:  Negative for rash.  Neurological:  Negative for syncope and facial asymmetry.  Psychiatric/Behavioral:  Negative for confusion.   See other pertinent positives and negatives in HPI.  Current Outpatient Medications on File Prior to Visit  Medication Sig Dispense Refill   Alcohol Swabs (B-D SINGLE USE SWABS REGULAR) PADS Use as necessary 300 each 6   amLODipine  (NORVASC ) 10 MG tablet Take 1 tablet (10 mg total) by mouth daily. 90 tablet 3   aspirin  81 MG tablet Take 81 mg by mouth daily.     atorvastatin  (LIPITOR) 40 MG tablet Take 1 tablet (40 mg total) by mouth daily. 90 tablet 1   Blood Glucose Monitoring Suppl (ONETOUCH VERIO IQ SYSTEM) w/Device KIT USE TO CHECK BLOOD SUGAR TWICE A DAY DX CODE E11.9 1 kit 2   chlorthalidone  (HYGROTON ) 25 MG tablet Take 1 tablet (25 mg total) by mouth daily. For blood pressure. 90 tablet 2   colchicine  0.6 MG tablet Take 1 tablet (0.6 mg total) by mouth daily as needed (for gout attack.). 90 tablet 2   furosemide  (LASIX ) 20 MG tablet Take 1 tablet (20 mg total) by mouth daily as needed for edema. 90 tablet 2   glucose blood (ONETOUCH VERIO) test strip Check before meals and at bedtime. 300 strip 3   insulin  aspart (NOVOLOG  FLEXPEN)  100 UNIT/ML FlexPen INJECT 20-22 UNITS INTO THE SKIN 2 (TWO) TIMES DAILY BEFORE A MEAL. E11.69 45 mL 1   insulin  glargine (LANTUS  SOLOSTAR) 100 UNIT/ML Solostar Pen Inject 36 Units into the skin daily. 30 mL 4   Insulin  Pen Needle (B-D UF III MINI PEN NEEDLES) 31G X 5 MM MISC USE ONCE DAILY 100 each 3   metFORMIN  (GLUCOPHAGE ) 1000 MG tablet TAKE ONE TABLET BY MOUTH TWICE DAILY. TAKE WITH A MEAL. 180 tablet 1   metoprolol  tartrate (LOPRESSOR ) 100 MG tablet Take 1 tablet (100 mg total) by mouth 2 (two) times daily. 180 tablet 2   OneTouch Delica Lancets 33G MISC Use to check blood sugar  twice daily. DX Code E11.9 200 each 1   pioglitazone  (ACTOS ) 15 MG tablet Take 1 tablet (15 mg total) by mouth daily. 90 tablet 3   ramipril  (ALTACE ) 10 MG capsule Take 1 capsule (10 mg total) by mouth daily. 90 capsule 2   sildenafil  (VIAGRA ) 50 MG tablet TAKE ONE TABLET BY MOUTH DAILY AS NEEDED FOR ERECTILE DYSFUNCTION 90 tablet 0   spironolactone  (ALDACTONE ) 25 MG tablet TAKE 1 TABLET BY MOUTH EVERY DAY FOR BLOOD PRESSURE 90 tablet 1   No current facility-administered medications on file prior to visit.    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   PNEUMONIA, LEFT LOWER LOBE 09/08/2009   Rash    No Known Allergies  Social History   Socioeconomic History   Marital status: Widowed    Spouse name: Not on file   Number of children: 2   Years of education: Not on file   Highest education level: Not on file  Occupational History    Comment: Retired from [employer]  Tobacco Use   Smoking status: Never   Smokeless tobacco: Never  Vaping Use   Vaping status: Never Used  Substance and Sexual Activity   Alcohol use: No    Comment: stopped drinking etoh completely in Dec 07, 1999   Drug use: No   Sexual activity: Yes    Comment: uses viagra   Other Topics Concern   Not on file  Social History Narrative   Lives alone   Retired from [employer] in 12-07-99   Twin sons, one son died of a stroke this year Dec 07, 2018) at age 35, his living son resides in [place]    His wife died of breast cancer 11 years ago    Social Drivers of Corporate investment banker Strain: Low Risk  (02/02/2022)   Overall Financial Resource Strain (CARDIA)    Difficulty of Paying Living Expenses: Not hard at all  Food Insecurity: No Food Insecurity (02/02/2022)   Hunger Vital Sign    Worried  About Running Out of Food in the Last Year: Never true    Ran Out of Food in the Last Year: Never true  Transportation Needs: No Transportation Needs (02/02/2022)   PRAPARE - Administrator, Civil Service (Medical): No    Lack of Transportation (Non-Medical): No  Physical Activity: Sufficiently Active (02/02/2022)   Exercise Vital Sign    Days of Exercise per Week: 7 days    Minutes of Exercise per Session: 150+ min  Stress: No Stress Concern Present (02/02/2022)   [employer] of  Occupational Health - Occupational Stress Questionnaire    Feeling of Stress : Not at all  Social Connections: Moderately Integrated (02/02/2022)   Social Connection and Isolation Panel [NHANES]    Frequency of Communication with Friends and Family: More than three times a week    Frequency of Social Gatherings with Friends and Family: More than three times a week    Attends Religious Services: More than 4 times per year    Active Member of [employer] or Organizations: Yes    Attends Banker Meetings: More than 4 times per year    Marital Status: Never married   Vitals:   11/20/23 0840  BP: 120/62  Pulse: 100  Resp: 16  Temp: 98.5 F (36.9 C)  SpO2: 99%   Body mass index is 29.89 kg/m.  Physical Exam Vitals and nursing note reviewed.  Constitutional:      General: He is not in acute distress.    Appearance: He is well-developed.  HENT:     Head: Normocephalic and atraumatic.     Mouth/Throat:     Mouth: Mucous membranes are moist.  Eyes:     Conjunctiva/sclera: Conjunctivae normal.  Cardiovascular:     Rate and Rhythm: Normal rate. Rhythm irregular.     Heart sounds: Murmur (SEM I/VI LUSB) heard.     Comments: DP and PT hard to find. Pulmonary:     Effort: Pulmonary effort is normal. No respiratory distress.     Breath sounds: Normal breath sounds.  Abdominal:     Palpations: Abdomen is soft. There is no mass.     Tenderness: There is no abdominal tenderness.   Musculoskeletal:     Right lower leg: No edema.     Left lower leg: No edema.  Skin:    General: Skin is warm.     Findings: No erythema or rash.  Neurological:     Mental Status: He is alert and oriented to person, place, and time.     Cranial Nerves: No cranial nerve deficit.     Gait: Gait normal.  Psychiatric:        Mood and Affect: Mood and affect normal.    ASSESSMENT AND PLAN: [name] was seen today for chronic disease management.   Orders Placed This Encounter  Procedures   Lipid panel   Comprehensive metabolic panel with GFR   CBC   Ambulatory referral to Cardiology   EKG 12-Lead   VAS US  ABI WITH/WO TBI   Hyperlipidemia associated with type 2 diabetes mellitus (HCC) Assessment & Plan: Currently on atorvastatin  40 mg daily. Low-fat diet was recommended. Further recommendation will be given according to lipid panel result.  Orders: -     Lipid panel; Future -     Comprehensive metabolic panel with GFR; Future  Resistant hypertension Assessment & Plan: BP adequately controlled. Continue amlodipine  10 mg daily, metoprolol  titrate 100 mg twice daily, Altace  10 mg daily, chlorthalidone  25 mg daily, and spironolactone  25 mg daily. Recommend monitoring BP regularly. Continue low-salt diet. Eye exam is due.  Orders: -     Comprehensive metabolic panel with GFR; Future -     CBC; Future -     EKG 12-Lead -     Ambulatory referral to Cardiology  Decreased pulses in feet I had difficulty findings peripheral pulses. No signs of claudication. ABI will be arranged.  -     VAS US  ABI WITH/WO TBI; Future  Irregular heart rate More irregular today  on auscultation, asymptomatic. Hx of PVC's. EKG today: SR, normal axis, frequent PAC's and PVC's, LAE. T wave abnormalities in inferior leads and minimal ST seg abnormalities. When compared with EKG in 05/2021 T wave seem to be more noticeable today in AVF and DII. Rest with no significant  changes. Cardiology referral placed. He was clearly instructed about warning signs.  -     EKG 12-Lead  Chronic heart failure with preserved ejection fraction [facility]) Assessment & Plan: Euvolemic today. Mildly reduced LVEF. Last echo in 07/2021 with LVEF 40 to 45%. Currently on metoprolol  titrate 100 mg twice daily, spironolactone  25 mg daily, and furosemide  20 mg daily as needed. He has not followed with his cardiologist at least 1 year.New referral placed.  Orders: -     Ambulatory referral to Cardiology  In regard to DM II, continue current medications and keep appt with endocrinologist.  Return in about 6 months (around 05/22/2024) for chronic problems.  I, Bernita Bristle, acting as a scribe for Ileene Allie [place], MD., have documented all relevant documentation on the behalf of Geeta Dworkin [place], MD, as directed by   while in the presence of Jamesia Linnen [place], MD.  I, Yazleen Molock [place], MD, have reviewed all documentation for this visit. The documentation on 11/20/23 for the exam, diagnosis, procedures, and orders are all accurate and complete.  Namari Breton G. [place], MD  [facility]. [place] office.

## 2023-11-20 NOTE — Patient Instructions (Addendum)
 A few things to remember from today's visit:  Hyperlipidemia associated with type 2 diabetes mellitus (HCC) - Plan: Lipid panel, Comprehensive metabolic panel with GFR  Resistant hypertension - Plan: Comprehensive metabolic panel with GFR, CBC, EKG 12-Lead, Ambulatory referral to Cardiology  Decreased pulses in feet - Plan: VAS US  ABI WITH/WO TBI  Irregular heart rate - Plan: EKG 12-Lead  Chronic heart failure with preserved ejection fraction (HCC) - Plan: Ambulatory referral to Cardiology  No changes today. Circulation test ordered today. Be sure you are following with your heart doctor as it was recommended.  I placed a new referral.  If you need refills for medications you take chronically, please call your pharmacy. Do not use My Chart to request refills or for acute issues that need immediate attention. If you send a my chart message, it may take a few days to be addressed, specially if I am not in the office.  Please be sure medication list is accurate. If a new problem present, please set up appointment sooner than planned today.

## 2023-11-20 NOTE — Assessment & Plan Note (Signed)
 BP adequately controlled. Continue amlodipine  10 mg daily, metoprolol  titrate 100 mg twice daily, Altace  10 mg daily, chlorthalidone  25 mg daily, and spironolactone  25 mg daily. Recommend monitoring BP regularly. Continue low-salt diet. Eye exam is due.

## 2023-11-20 NOTE — Assessment & Plan Note (Signed)
 Currently on atorvastatin  40 mg daily. Low-fat diet was recommended. Further recommendation will be given according to lipid panel result.

## 2023-11-20 NOTE — Assessment & Plan Note (Addendum)
 Euvolemic today. Mildly reduced LVEF. Last echo in 07/2021 with LVEF 40 to 45%. Currently on metoprolol  titrate 100 mg twice daily, spironolactone  25 mg daily, and furosemide  20 mg daily as needed. He has not followed with his cardiologist at least 1 year.New referral placed.

## 2023-11-21 ENCOUNTER — Ambulatory Visit: Payer: Self-pay

## 2023-11-23 ENCOUNTER — Ambulatory Visit (INDEPENDENT_AMBULATORY_CARE_PROVIDER_SITE_OTHER): Admitting: Podiatry

## 2023-11-23 ENCOUNTER — Ambulatory Visit: Admitting: Podiatry

## 2023-11-23 ENCOUNTER — Encounter: Payer: Self-pay | Admitting: Podiatry

## 2023-11-23 VITALS — Ht 72.0 in | Wt 220.0 lb

## 2023-11-23 DIAGNOSIS — M79674 Pain in right toe(s): Secondary | ICD-10-CM | POA: Diagnosis not present

## 2023-11-23 DIAGNOSIS — M79675 Pain in left toe(s): Secondary | ICD-10-CM | POA: Diagnosis not present

## 2023-11-23 DIAGNOSIS — B351 Tinea unguium: Secondary | ICD-10-CM

## 2023-11-23 DIAGNOSIS — B353 Tinea pedis: Secondary | ICD-10-CM | POA: Diagnosis not present

## 2023-11-23 MED ORDER — KETOCONAZOLE 2 % EX CREA: 1.0000 | TOPICAL_CREAM | Freq: Every day | CUTANEOUS | 2 refills | Status: DC

## 2023-11-23 NOTE — Progress Notes (Signed)
  Subjective:  Patient ID: Patrick Wilcox, male    DOB: [birth year],  MRN: [id number]  Chief Complaint  Patient presents with   Nail Problem    Patient is here for right foot nail fungus and thickness, removal of right hallux nail     78 y.o. male presents with the above complaint. History confirmed with patient.  He notes worsening nail fungus on both feet and has been getting thick and difficult to cut he does not recall his exact last A1c.  Objective:  Physical Exam: DP reduced bilateral, nail exam onychomycosis of the toenails and poor capillary filling, normal sensory exam, and PT reduced bilateral.  Hemoglobin A1c 09/13/2023 8.9% Assessment:   1. Pain due to onychomycosis of toenails of both feet   2. Tinea pedis of both feet      Plan:  Patient was evaluated and treated and all questions answered.  Discussed the etiology and treatment options for the condition in detail with the patient.  Do not think topical therapy will be effective and with his multiple comorbidities and medications likely not a good candidate for oral therapy.  Recommended debridement of the nails today. Sharp and mechanical debridement performed of all painful and mycotic nails today. Nails debrided in length and thickness using a nail nipper to level of comfort.  I explained to him that until his A1c is improved to less than 8% and he has noninvasive circulation testing completed (his PCP has ordered ABIs but he has not completed them), I think he is at high risk of postprocedural complications including infection and gangrenous changes and amputation until this is mitigated or reduced or evaluated.  I gave him information on scheduling his ABIs to have him scheduled sooner and once he has had these completed and his A1c is improved he will call to schedule follow-up with us  for this.    Return if symptoms worsen or fail to improve.

## 2023-11-23 NOTE — Patient Instructions (Addendum)
 Call to schedule your vascular testing:   [facility] Jeralene Mom. [facility] [address] [place],  [place]  [address]   Main: [phone]    Please call to see me again after your vascular testing is completed and your hemoglobin A1c is less than 8% and we can look at removing the toenail.  We are not doing this today because if your hemoglobin A1c is too high or you have poor circulation this could leave a wound and lead to an infection that could lead to losing your toe with an amputation.

## 2023-12-18 ENCOUNTER — Other Ambulatory Visit

## 2023-12-19 ENCOUNTER — Other Ambulatory Visit

## 2023-12-19 DIAGNOSIS — E1165 Type 2 diabetes mellitus with hyperglycemia: Secondary | ICD-10-CM | POA: Diagnosis not present

## 2023-12-19 DIAGNOSIS — Z794 Long term (current) use of insulin: Secondary | ICD-10-CM | POA: Diagnosis not present

## 2023-12-20 ENCOUNTER — Ambulatory Visit: Payer: Self-pay | Admitting: Endocrinology

## 2023-12-20 LAB — HEMOGLOBIN A1C
Hgb A1c MFr Bld: 8.6 % — ABNORMAL HIGH (ref <5.7)
Mean Plasma Glucose: 200 mg/dL
eAG (mmol/L): 11.1 mmol/L

## 2023-12-20 LAB — BASIC METABOLIC PANEL WITH GFR
BUN: 12 mg/dL (ref 7–25)
CO2: 28 mmol/L (ref 20–32)
Calcium: 9.6 mg/dL (ref 8.6–10.3)
Chloride: 102 mmol/L (ref 98–110)
Creat: 1.09 mg/dL (ref 0.70–1.28)
Glucose, Bld: 153 mg/dL — ABNORMAL HIGH (ref 65–99)
Potassium: 4.4 mmol/L (ref 3.5–5.3)
Sodium: 137 mmol/L (ref 135–146)
eGFR: 69 mL/min/{1.73_m2} (ref >=60)

## 2023-12-21 ENCOUNTER — Ambulatory Visit: Admitting: Endocrinology

## 2023-12-21 ENCOUNTER — Encounter: Payer: Self-pay | Admitting: Endocrinology

## 2023-12-21 VITALS — BP 128/70 | HR 78 | Resp 16 | Ht 72.0 in | Wt 223.0 lb

## 2023-12-21 DIAGNOSIS — E1165 Type 2 diabetes mellitus with hyperglycemia: Secondary | ICD-10-CM | POA: Diagnosis not present

## 2023-12-21 DIAGNOSIS — Z794 Long term (current) use of insulin: Secondary | ICD-10-CM | POA: Diagnosis not present

## 2023-12-21 MED ORDER — EMPAGLIFLOZIN 10 MG PO TABS
10.0000 mg | ORAL_TABLET | Freq: Every day | ORAL | 3 refills | Status: DC
Start: 2023-12-21 — End: 2024-03-28

## 2023-12-21 NOTE — Patient Instructions (Addendum)
 Diabetes regimen  Lantus   32 units daily. Adjust NovoLog  10-20 units with meals up to 3 times a day, larger meal 20 units and small meal 10 untis. Adjust based on meal, not more than 20 units at at time.  Continue metformin  1000 mg 2 times a day. Continue Actos /pioglitazone  15 mg daily. Start jardiance 10 mg daily.   Check blood sugar in the morning fasting and at bedtime at least twice a day. Bring meter on follow up.

## 2023-12-21 NOTE — Progress Notes (Signed)
 Outpatient Endocrinology Note Patrick Esra Frankowski, MD  12/21/23  Patient's Name: Patrick Wilcox    DOB: February 14, 1946    MRN: [id number]                                                    REASON OF VISIT: Follow up for type 2 diabetes mellitus  PCP: [place], [name], MD  HISTORY OF PRESENT ILLNESS:   Patrick Wilcox is a 78 y.o. old male with past medical history listed below, is here for  follow up of type 2 diabetes mellitus.   Pertinent Diabetes History: Patient was diagnosed with type 2 diabetes mellitus in 2004.  He was initially treated with insulin  and subsequently switched back to oral medications.  He had been on and off of the insulin  in the past.  He was on Amaryl  and Januvia  in the past per record.  He was also on premixed insulin  in the past which was switched to Lantus  and Humalog  later.  He had seen diabetic educator had lifestyle changes and had better control of diabetes however overall he has uncontrolled diabetes mellitus.  Chronic Diabetes Complications : Retinopathy: Unknown. Last ophthalmology exam was done on Due, referred to ophthalmology. Nephropathy: yes, on ramipril .  + microalbuminuria. Peripheral neuropathy: yes, following with podiatry. Coronary artery disease: yes Stroke: no  Relevant comorbidities and cardiovascular risk factors: Obesity: no Body mass index is 30.24 kg/m.  Hypertension: yes Hyperlipidemia. Yes, on statin  Current / Home Diabetic regimen includes: Lantus  32 units daily.  NovoLog  20 units taking 1-2 a day. Metformin  1000 mg 2 times a day. Pioglitazone /Actos  15 mg daily.  Prior diabetic medications: Amaryl /glimepiride  and Januvia .  Glycemic data:   He had used freestyle libre CGM in the past, did not like the idea of alarms, in the accuracy of the reading at times, required to change often. He forgot to bring glucometer in the clinic today.  He reports he had low blood sugar up to 45 few weeks ago and decrease Lantus  to 32 units daily from  36 units.  Hypoglycemia: Patient has ? hypoglycemic episodes. Patient has hypoglycemia awareness.  Factors modifying glucose control: 1.  Diabetic diet assessment: two meals a day.   2.  Staying active or exercising: walking.   3.  Medication compliance: compliant most of the time.  Interval history  No glucose data to review.  Hemoglobin A1c slightly better 8.6%.  Renal function is stable.  Diabetes assessment as reviewed and noted above.  Patient reports after he started to take Lantus , switched from Tresiba  he noted low blood sugar and decreased from 36 to 32 units daily.  NovoLog  he takes 20 units usually once a day.  He is twice daily sometimes does not take NovoLog  for second meal of the day.  He sometimes snacks with the sweets.  No other complaints today.   REVIEW OF SYSTEMS As per history of present illness.   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   PNEUMONIA, LEFT LOWER LOBE 09/08/2009   Rash     PAST SURGICAL HISTORY: Past  Surgical History:  Procedure Laterality Date   CORONARY ANGIOPLASTY WITH STENT PLACEMENT     CORONARY ARTERY BYPASS GRAFT  11-Jan-2002    ALLERGIES: No Known Allergies  FAMILY HISTORY:  Family History  Problem Relation Age of Onset   Heart disease Mother    Heart disease Father    Diabetes Sister    Diabetes Brother     SOCIAL HISTORY: Social History   Socioeconomic History   Marital status: Widowed    Spouse name: Not on file   Number of children: 2   Years of education: Not on file   Highest education level: Not on file  Occupational History    Comment: Retired from [employer]  Tobacco Use   Smoking status: Never   Smokeless tobacco: Never  Vaping Use   Vaping status: Never Used   Substance and Sexual Activity   Alcohol use: No    Comment: stopped drinking etoh completely in 01-12-00   Drug use: No   Sexual activity: Yes    Comment: uses viagra   Other Topics Concern   Not on file  Social History Narrative   Lives alone   Retired from [employer] in 01-12-00   Twin sons, one son died of a stroke this year 2019-01-12) at age 81, his living son resides in [place]    His wife died of breast cancer 11 years ago    Social Drivers of Corporate investment banker Strain: Low Risk  (02/02/2022)   Overall Financial Resource Strain (CARDIA)    Difficulty of Paying Living Expenses: Not hard at all  Food Insecurity: No Food Insecurity (02/02/2022)   Hunger Vital Sign    Worried About Running Out of Food in the Last Year: Never true    Ran Out of Food in the Last Year: Never true  Transportation Needs: No Transportation Needs (02/02/2022)   PRAPARE - Administrator, Civil Service (Medical): No    Lack of Transportation (Non-Medical): No  Physical Activity: Sufficiently Active (02/02/2022)   Exercise Vital Sign    Days of Exercise per Week: 7 days    Minutes of Exercise per Session: 150+ min  Stress: No Stress Concern Present (02/02/2022)   [employer] of Occupational Health - Occupational Stress Questionnaire    Feeling of Stress : Not at all  Social Connections: Moderately Integrated (02/02/2022)   Social Connection and Isolation Panel    Frequency of Communication with Friends and Family: More than three times a week    Frequency of Social Gatherings with Friends and Family: More than three times a week    Attends Religious Services: More than 4 times per year    Active Member of [employer] or Organizations: Yes    Attends Engineer, structural: More than 4 times per year    Marital Status: Never married    MEDICATIONS:  Current Outpatient Medications  Medication Sig Dispense Refill   Alcohol Swabs (B-D SINGLE USE SWABS REGULAR) PADS Use as necessary  300 each 6   amLODipine  (NORVASC ) 10 MG tablet Take 1 tablet (10 mg total) by mouth daily. 90 tablet 3   aspirin  81 MG tablet Take 81 mg by mouth daily.     atorvastatin  (LIPITOR) 40 MG tablet Take 1 tablet (40 mg total) by mouth daily. 90 tablet 1   Blood Glucose Monitoring Suppl (ONETOUCH VERIO IQ SYSTEM) w/Device KIT USE TO CHECK BLOOD SUGAR TWICE A DAY DX CODE E11.9 1 kit 2   chlorthalidone  (HYGROTON )  25 MG tablet Take 1 tablet (25 mg total) by mouth daily. For blood pressure. 90 tablet 2   colchicine  0.6 MG tablet Take 1 tablet (0.6 mg total) by mouth daily as needed (for gout attack.). 90 tablet 2   empagliflozin (JARDIANCE) 10 MG TABS tablet Take 1 tablet (10 mg total) by mouth daily before breakfast. 90 tablet 3   furosemide  (LASIX ) 20 MG tablet Take 1 tablet (20 mg total) by mouth daily as needed for edema. 90 tablet 2   glucose blood (ONETOUCH VERIO) test strip Check before meals and at bedtime. 300 strip 3   insulin  aspart (NOVOLOG  FLEXPEN) 100 UNIT/ML FlexPen INJECT 20-22 UNITS INTO THE SKIN 2 (TWO) TIMES DAILY BEFORE A MEAL. E11.69 45 mL 1   insulin  glargine (LANTUS  SOLOSTAR) 100 UNIT/ML Solostar Pen Inject 36 Units into the skin daily. 30 mL 4   Insulin  Pen Needle (B-D UF III MINI PEN NEEDLES) 31G X 5 MM MISC USE ONCE DAILY 100 each 3   ketoconazole  (NIZORAL ) 2 % cream Apply 1 Application topically daily. 60 g 2   metFORMIN  (GLUCOPHAGE ) 1000 MG tablet TAKE ONE TABLET BY MOUTH TWICE DAILY. TAKE WITH A MEAL. 180 tablet 1   metoprolol  tartrate (LOPRESSOR ) 100 MG tablet Take 1 tablet (100 mg total) by mouth 2 (two) times daily. 180 tablet 2   OneTouch Delica Lancets 33G MISC Use to check blood sugar twice daily. DX Code E11.9 200 each 1   pioglitazone  (ACTOS ) 15 MG tablet Take 1 tablet (15 mg total) by mouth daily. 90 tablet 3   ramipril  (ALTACE ) 10 MG capsule Take 1 capsule (10 mg total) by mouth daily. 90 capsule 2   sildenafil  (VIAGRA ) 50 MG tablet TAKE ONE TABLET BY MOUTH DAILY AS  NEEDED FOR ERECTILE DYSFUNCTION 90 tablet 0   spironolactone  (ALDACTONE ) 25 MG tablet TAKE 1 TABLET BY MOUTH EVERY DAY FOR BLOOD PRESSURE 90 tablet 1   No current facility-administered medications for this visit.    PHYSICAL EXAM: Vitals:   12/21/23 0933  BP: 128/70  Pulse: 78  Resp: 16  SpO2: 98%  Weight: 223 lb (101.2 kg)  Height: 6' (1.829 m)     Body mass index is 30.24 kg/m.  Wt Readings from Last 3 Encounters:  12/21/23 223 lb (101.2 kg)  11/23/23 220 lb (99.8 kg)  11/20/23 220 lb 6 oz (100 kg)    General: Well developed, well nourished male in no apparent distress.  HEENT: AT/, no external lesions.  Eyes: Conjunctiva clear and no icterus. Neck: Neck supple  Lungs: Respirations not labored Neurologic: Alert, oriented, normal speech Extremities / Skin: Dry.  Psychiatric: Does not appear depressed or anxious  Diabetic Foot Exam - Simple   No data filed    LABS Reviewed Lab Results  Component Value Date   HGBA1C 8.6 (H) 12/19/2023   HGBA1C 8.9 (H) 09/13/2023   HGBA1C 7.5 (A) 06/20/2023   Lab Results  Component Value Date   FRUCTOSAMINE 305 (H) 06/01/2020   FRUCTOSAMINE 296 (H) 12/31/2019   FRUCTOSAMINE 315 (H) 09/09/2019   Lab Results  Component Value Date   CHOL 212 (H) 11/20/2023   HDL 38.00 (L) 11/20/2023   LDLCALC 142 (H) 11/20/2023   LDLDIRECT 71.0 02/28/2022   TRIG 159.0 (H) 11/20/2023   CHOLHDL 6 11/20/2023   Lab Results  Component Value Date   MICRALBCREAT 362 (H) 09/18/2023   MICRALBCREAT 7.0 09/19/2022   Lab Results  Component Value Date   CREATININE 1.09 12/19/2023  Lab Results  Component Value Date   GFR 38.79 (L) 11/20/2023    ASSESSMENT / PLAN  1. Uncontrolled type 2 diabetes mellitus with hyperglycemia, with long-term current use of insulin  (HCC)     Diabetes Mellitus type 2, complicated by CAD /diabetic foot ulcer/ microalbuminuria. - Diabetic status / severity: Uncontrolled, improving.  Lab Results  Component  Value Date   HGBA1C 8.6 (H) 12/19/2023    - Hemoglobin A1c goal : <7%  Discussed that it is okay to continue current dose of Lantus  32 units daily.  Advised patient to take NovoLog  with both of meals of the day and/state as follows.  - Medications: See below,   Diabetes regimen Continue Lantus  32 units daily. Adjust NovoLog  10-20 units with meals up to 3 times a day, larger meal 20 units and small meal 10 untis. Adjust based on meal, not more than 20 units at at time.  Discussed in detail. Continue metformin  1000 mg 2 times a day. Continue Actos /pioglitazone  15 mg daily. Start Jardiance 10 mg daily.  Check blood sugar in the morning fasting and at bedtime at least twice a day.  Asked to bring glucometer in the follow-up visit.  - Discussed/ Gave Hypoglycemia treatment plan.  # Consult : not required at this time.   # Annual urine for microalbuminuria/ creatinine ratio, no microalbuminuria currently, continue ACE/ARB /ramipril .  Start SGLT2 inhibitor Jardiance as mentioned above. Last  Lab Results  Component Value Date   MICRALBCREAT 362 (H) 09/18/2023    # Foot check nightly / neuropathy.  Following with podiatry.  # Annual dilated diabetic eye exams.  - Diet: Make healthy diabetic food choices - Life style / activity / exercise: Discussed.  2. Blood pressure  -  BP Readings from Last 1 Encounters:  12/21/23 128/70    - Control is in target.  - No change in current plans.  3. Lipid status / Hyperlipidemia - Last  Lab Results  Component Value Date   LDLCALC 142 (H) 11/20/2023   - Continue continue atorvastatin  40 mg daily.  Managed by primary care provider/cardiology.  Diagnoses and all orders for this visit:  Uncontrolled type 2 diabetes mellitus with hyperglycemia, with long-term current use of insulin  (HCC) -     Basic metabolic panel with GFR -     Hemoglobin A1c -     empagliflozin (JARDIANCE) 10 MG TABS tablet; Take 1 tablet (10 mg total) by mouth daily  before breakfast. -     Microalbumin / creatinine urine ratio       DISPOSITION Follow up in clinic in 3 months suggested.  Labs prior to follow-up visit as ordered.   All questions answered and patient verbalized understanding of the plan.  Labs prior to follow-up visit as ordered.  Patrick Jarrah Babich, MD [facility] Endocrinology [facility] [address] [place], [address] [place], [place] [address] Phone # [phone]  At least part of this note was generated using voice recognition software. Inadvertent word errors may have occurred, which were not recognized during the proofreading process.

## 2023-12-24 ENCOUNTER — Other Ambulatory Visit: Payer: Self-pay | Admitting: Family Medicine

## 2023-12-24 DIAGNOSIS — I1A0 Resistant hypertension: Secondary | ICD-10-CM

## 2024-01-16 ENCOUNTER — Other Ambulatory Visit: Payer: Self-pay

## 2024-01-16 ENCOUNTER — Telehealth: Payer: Self-pay | Admitting: Endocrinology

## 2024-01-16 DIAGNOSIS — E1165 Type 2 diabetes mellitus with hyperglycemia: Secondary | ICD-10-CM

## 2024-01-16 DIAGNOSIS — E1169 Type 2 diabetes mellitus with other specified complication: Secondary | ICD-10-CM

## 2024-01-16 MED ORDER — METFORMIN HCL 1000 MG PO TABS: ORAL_TABLET | ORAL | 1 refills | Status: DC

## 2024-01-16 MED ORDER — BD PEN NEEDLE MINI U/F 31G X 5 MM MISC: 3 refills | Status: DC

## 2024-01-16 NOTE — Telephone Encounter (Signed)
 All refills requested sent in

## 2024-01-16 NOTE — Telephone Encounter (Signed)
 MEDICATION: Pen Needles (out), Jardiance  ([facility] claims did not receive), and Metformin  (out)  [facility] [address]   HAS THE PATIENT CONTACTED THEIR PHARMACY?  yes  IS THIS A 90 DAY SUPPLY : yes  IS PATIENT OUT OF MEDICATION: yes  IF NOT; HOW MUCH IS LEFT:   LAST APPOINTMENT DATE: @6 /06/2024  NEXT APPOINTMENT DATE:@9 /17/2025  DO WE HAVE YOUR PERMISSION TO LEAVE A DETAILED MESSAGE?: yes  OTHER COMMENTS:    **Let patient know to contact pharmacy at the end of the day to make sure medication is ready. **  ** Please notify patient to allow 48-72 hours to process**  **Encourage patient to contact the pharmacy for refills or they can request refills through [facility]**

## 2024-01-24 ENCOUNTER — Other Ambulatory Visit (INDEPENDENT_AMBULATORY_CARE_PROVIDER_SITE_OTHER)

## 2024-01-24 DIAGNOSIS — E785 Hyperlipidemia, unspecified: Secondary | ICD-10-CM

## 2024-01-24 DIAGNOSIS — I1A0 Resistant hypertension: Secondary | ICD-10-CM

## 2024-01-24 DIAGNOSIS — E1169 Type 2 diabetes mellitus with other specified complication: Secondary | ICD-10-CM

## 2024-01-24 NOTE — Progress Notes (Signed)
 01/24/2024 Name: Patrick Wilcox MRN: [id number] DOB: 06-25-1946  Chief Complaint  Patient presents with   Medication Management   Diabetes    Patrick Wilcox is a 78 y.o. year old male who presented for a telephone visit.   They were referred to the pharmacist by their PCP for assistance in managing diabetes, hypertension, and complex medication management.    Subjective:  Care Team: Primary Care Provider: [place], [name], MD ; Next Scheduled Visit: 05/22/24  Medication Access/Adherence  [facility]  [id number] [place], [place] - [address] AT [facility] [address] [place] [address] Phone: [phone] Fax: [phone]  [facility] [id number] - [place], [place] - [address] [facility] [address] [facility] [address] [facility] [address] [place] [address] Phone: [phone] Fax: [phone]  [facility] (New Address) - [place], [place] - [address] [facility] AT Previously: Viviana Mulligan, [place] [address] [facility] [address] [address] Phone: [phone] Fax: [phone]  [phone] [place], [place] - [address] AT [address]NER OF [facility] [address] [place] [address] Phone: [phone] Fax: [phone]   Patient reports affordability concerns with their medications: No  Patient reports access/transportation concerns to their pharmacy: No  Patient reports adherence concerns with their medications:  No     Diabetes:  Current medications: Metformin  1000mg  BID, Lantus  30 units, Novolog  10-20 BID, Actos  15mg  daily, Jardiance  10mg  (has not started yet, never picked up from pharmacy) Medications tried in the past: Glimepiride , Januvia   Current glucose readings:  Brings in BG meter to appt Has has a couple of lows since endo visit last month but reports they have lessened since decreasing his Novolog  dose Checks fasting  BG always, most readings below 130 but still some elevated in the 150s  Patient denies hypoglycemic s/sx including dizziness, shakiness, sweating. Patient denies hyperglycemic symptoms including polyuria, polydipsia, polyphagia, nocturia, neuropathy, blurred vision.   Hypertension:  Current medications: Ramipril  10mg  1 capsule daily, Spironolactone  25mg , Metoprolol  tartrate 100mg  BID, Lasix  20mg  once daily prn edema, Chlorthalidone  25mg  1/2 tab daily, Amlodipine  10mg   Out of chlorthalidone  at this appt  Medications previously tried: HCTZ  Patient has a validated, automated, upper arm home BP cuff Current blood pressure readings readings: Uses home monitor and checks every time he is at drug store, reports this usually happens at least 1-2x every couple weeks, cannot recall any readings though, said he felt like they looked good but wasn't sure  Patient denies hypotensive s/sx including dizziness, lightheadedness.  Patient denies hypertensive symptoms including headache, chest pain, shortness of breath    Hyperlipidemia/ASCVD Risk Reduction  Current lipid lowering medications: Atorvastatin  40mg  1 every day (out of at this appt, has been out for at least a month per fill date review) Medications tried in the past: None    Objective:  Lab Results  Component Value Date   HGBA1C 8.6 (H) 12/19/2023    Lab Results  Component Value Date   CREATININE 1.09 12/19/2023   BUN 12 12/19/2023   NA 137 12/19/2023   K 4.4 12/19/2023   CL 102 12/19/2023   CO2 28 12/19/2023    Lab Results  Component Value Date   CHOL 212 (H) 11/20/2023   HDL 38.00 (L) 11/20/2023   LDLCALC 142 (H) 11/20/2023   LDLDIRECT 71.0 02/28/2022   TRIG 159.0 (H) 11/20/2023   CHOLHDL 6 11/20/2023    Medications Reviewed  Today     Reviewed by Lionell Jon DEL, RPH (Pharmacist) on 01/24/24 at 1226  Med List Status: <None>   Medication Order Taking? Sig Documenting Provider Last Dose Status Informant   Alcohol Swabs (B-D SINGLE USE SWABS REGULAR) PADS [id number]  Use as necessary Von Pacific, MD  Active   amLODipine  (NORVASC ) 10 MG tablet [id number] Yes Take 1 tablet (10 mg total) by mouth daily. [place], [name], MD  Active   aspirin  81 MG tablet [id number] Yes Take 81 mg by mouth daily. [provider]  Active   atorvastatin  (LIPITOR) 40 MG tablet [id number]  Take 1 tablet (40 mg total) by mouth daily. [place], [name], MD  Active   Blood Glucose Monitoring Suppl [facility] VERIO IQ SYSTEM) w/Device PRESSLEY [id number]  USE TO CHECK BLOOD SUGAR TWICE A DAY DX CODE E11.9 Von Pacific, MD  Active   chlorthalidone  (HYGROTON ) 25 MG tablet [id number]  Take 1 tablet (25 mg total) by mouth daily. For blood pressure. [place], [name], MD  Active   colchicine  0.6 MG tablet [id number] Yes Take 1 tablet (0.6 mg total) by mouth daily as needed (for gout attack.). [place], [name], MD  Active   empagliflozin  (JARDIANCE ) 10 MG TABS tablet [id number]  Take 1 tablet (10 mg total) by mouth daily before breakfast. Thapa, [place], MD  Active   furosemide  (LASIX ) 20 MG tablet [id number] Yes Take 1 tablet (20 mg total) by mouth daily as needed for edema. [place], [name], MD  Active   glucose blood [facility] VERIO) test strip [id number]  Check before meals and at bedtime. Thapa, [place], MD  Active   insulin  aspart (NOVOLOG  FLEXPEN) 100 UNIT/ML FlexPen [id number] Yes INJECT 20-22 UNITS INTO THE SKIN 2 (TWO) TIMES DAILY BEFORE A MEAL. E11.69 Thapa, [place], MD  Active   insulin  glargine (LANTUS  SOLOSTAR) 100 UNIT/ML Solostar Pen [id number] Yes Inject 36 Units into the skin daily.  Patient taking differently: Inject 30 Units into the skin daily.   Thapa, [place], MD  Active   Insulin  Pen Needle (B-D UF III MINI PEN NEEDLES) 31G X 5 MM MISC [id number]  USE ONCE DAILY Thapa, [place], MD  Active   ketoconazole  (NIZORAL ) 2 % cream [id number]  Apply 1 Application topically daily. Silva Juliene SAUNDERS, DPM  Active   metFORMIN  (GLUCOPHAGE ) 1000 MG tablet  [id number] Yes TAKE ONE TABLET BY MOUTH TWICE DAILY. TAKE WITH A MEAL. Thapa, [place], MD  Active   metoprolol  tartrate (LOPRESSOR ) 100 MG tablet [id number] Yes Take 1 tablet (100 mg total) by mouth 2 (two) times daily. [place], [name], MD  Active   OneTouch Delica Lancets 33G [place] [id number]  Use to check blood sugar twice daily. DX Code E11.9 Von Pacific, MD  Active   pioglitazone  (ACTOS ) 15 MG tablet [id number] Yes Take 1 tablet (15 mg total) by mouth daily. Thapa, [place], MD  Active   ramipril  (ALTACE ) 10 MG capsule [id number] Yes Take 1 capsule (10 mg total) by mouth daily. [place], [name], MD  Active   sildenafil  (VIAGRA ) 50 MG tablet [id number]  TAKE ONE TABLET BY MOUTH DAILY AS NEEDED FOR ERECTILE DYSFUNCTION [place], [name], MD  Active   spironolactone  (ALDACTONE ) 25 MG tablet [id number] Yes TAKE 1 TABLET BY MOUTH EVERY DAY FOR BLOOD PRESSURE [place], [name], MD  Active               Assessment/Plan:   Diabetes: - Currently uncontrolled at last A1C check but reporting controlled readings at home  since then - Reviewed long term cardiovascular and renal outcomes of uncontrolled blood sugar - Reviewed goal A1c, goal fasting, and goal 2 hour post prandial glucose - Recommend to START Jardiance  as previously prescribed by endo. Coordinated with pharmacy, it is now ready for pickup and patient will pick up today. - Patient denies personal or family history of multiple endocrine neoplasia type 2, medullary thyroid cancer; personal history of pancreatitis or gallbladder disease. - Recommend to check glucose once to twice daily    Hypertension: - Currently controlled - Reviewed long term cardiovascular and renal outcomes of uncontrolled blood pressure - Reviewed appropriate blood pressure monitoring technique and reviewed goal blood pressure. Recommended to check home blood pressure and heart rate at least once weekly - Recommend to continue current medication therapy. Coordinated refill of  chlorthalidone  with pharmacy    Hyperlipidemia/ASCVD Risk Reduction: - Currently controlled.  - Recommend to continue current medication therapy, coordinated lipitor refill with pharmacy    Follow Up Plan: 2 weeks  Jon VEAR Lindau, PharmD Clinical Pharmacist [phone]

## 2024-02-05 ENCOUNTER — Telehealth: Payer: Self-pay | Admitting: *Deleted

## 2024-02-05 NOTE — Telephone Encounter (Signed)
 Patient was identified as falling into the [place] Measure - Diabetes.   Patient was: Appointment already scheduled for:  04/01/24 with Dr Mercie.

## 2024-02-07 ENCOUNTER — Telehealth: Payer: Self-pay

## 2024-02-07 ENCOUNTER — Other Ambulatory Visit

## 2024-02-07 NOTE — Progress Notes (Signed)
   02/07/2024  Patient ID: Patrick Wilcox, male   DOB: August 23, 1945, 78 y.o.   MRN: [id number]  Attempted to contact patient for scheduled appointment for medication management. Unable to leave a voicemail.   Jon VEAR Lindau, PharmD Clinical Pharmacist [phone]

## 2024-02-13 ENCOUNTER — Telehealth (HOSPITAL_COMMUNITY): Payer: Self-pay

## 2024-02-13 ENCOUNTER — Ambulatory Visit (HOSPITAL_COMMUNITY): Admission: RE | Admit: 2024-02-13 | Source: Ambulatory Visit

## 2024-02-13 NOTE — Telephone Encounter (Signed)
 Attempted to contact the patient to schedule VAS US .  No answer.  Unable to leave message.  Second Attempt. Was unable to provide direct contact number for scheduling: [phone].   Due to nature of unable to leave message will send patient a letter one contact attempt early. Previously the appointment was scheduled at the end of may, patient missed today's appointment and I have been unable to contact.

## 2024-02-20 ENCOUNTER — Encounter (HOSPITAL_COMMUNITY): Payer: Self-pay

## 2024-02-27 ENCOUNTER — Telehealth: Payer: Self-pay | Admitting: Family Medicine

## 2024-02-27 NOTE — Telephone Encounter (Signed)
 I tried to contact son, Patrick Wilcox, to inquire about Mr Patrick Wilcox's death. I receive death certificate via email to be completed but I do not have any information about circumstances of his death. Could not leave message, will try again tomorrow. Yandell Mcjunkins [place], MD

## 2024-02-28 NOTE — Telephone Encounter (Signed)
 Called again today x 4 ([phone] x 2 and [phone] 2) no answer, there is no voicemail set up, so I could not leave message. Will try to contact son again when I am back to the office, Friday. I am not going to be able to complete death certificate until I speak with family member to obtain information needed to do so.  If patient's family calls back, we need to inquire about circumstances of death (time, where he was found, EMS/ED evaluation,etc). Diarra Kos [place], MD

## 2024-03-05 ENCOUNTER — Telehealth: Payer: Self-pay | Admitting: Family Medicine

## 2024-03-05 NOTE — Telephone Encounter (Signed)
 Funeral home is calling and pt died on Mar 15, 2024 and would like dr Sw[place]and to sign off on death certification in [facility] system.

## 2024-03-05 NOTE — Telephone Encounter (Signed)
 Spoke with funeral home, more information is needed in order to fill out the death certificate. Dr. Swaziland has tried calling the family multiple times since 8/19 without any return calls. Crystal will get the EMS report and call back.

## 2024-03-05 NOTE — Telephone Encounter (Signed)
 Copied from CRM [id number]. Topic: General - Deceased Patient >> 30-Mar-2024 12:53 PM Rosina BIRCH wrote: Name of caller: Camelia Plater  Date of death: Mar 10, 2024   Name of funeral home: [facility]  Phone number of funeral home: [phone]  Provider that needs to sign form: MD Betty [place]  Timeline for signing: five days and it has been twenty days and the funeral home has not received a signature from the doctor

## 2024-03-06 NOTE — Telephone Encounter (Signed)
 Called to follow back up with funeral home to see if EMS report had been received, it has not. Crystal did have another number for the family : [phone]. Forwarding to PCP

## 2024-03-06 NOTE — Telephone Encounter (Signed)
 The family had not heard from the patient so they sent the police out for a well check and he was found unresponsive it was due to natural causes. They reached out to the Corner and they said the doctor could put on the death certificate considering the patient had diabetes and HTN cause of death could reasonably be listed as probable atherosclerotic cardiovascular disease.

## 2024-03-06 NOTE — Telephone Encounter (Signed)
 I tried to reach son one more time to find out about time of death, unsuccessful. Death certificate was completed, time of death I used 03-07-24 at 12:01 am. Thanks, BJ

## 2024-03-11 DEATH — deceased

## 2024-03-27 ENCOUNTER — Other Ambulatory Visit

## 2024-04-01 ENCOUNTER — Ambulatory Visit: Admitting: Endocrinology

## 2024-04-20 ENCOUNTER — Other Ambulatory Visit: Payer: Self-pay | Admitting: Endocrinology

## 2024-05-22 ENCOUNTER — Ambulatory Visit: Admitting: Family Medicine
